# Patient Record
Sex: Female | Born: 1955 | Race: White | Hispanic: No | Smoking: Former smoker
Health system: Southern US, Community
[De-identification: ages and names within clinical notes are randomized; demographics above are authoritative.]

## PROBLEM LIST (undated history)

## (undated) DIAGNOSIS — C801 Malignant (primary) neoplasm, unspecified: Secondary | ICD-10-CM

## (undated) DIAGNOSIS — Z86010 Personal history of colonic polyps: Secondary | ICD-10-CM

## (undated) DIAGNOSIS — M87051 Idiopathic aseptic necrosis of right femur: Principal | ICD-10-CM

## (undated) DIAGNOSIS — F418 Other specified anxiety disorders: Secondary | ICD-10-CM

## (undated) DIAGNOSIS — C539 Malignant neoplasm of cervix uteri, unspecified: Secondary | ICD-10-CM

## (undated) DIAGNOSIS — F419 Anxiety disorder, unspecified: Secondary | ICD-10-CM

## (undated) DIAGNOSIS — M1990 Unspecified osteoarthritis, unspecified site: Secondary | ICD-10-CM

## (undated) DIAGNOSIS — E871 Hypo-osmolality and hyponatremia: Secondary | ICD-10-CM

## (undated) DIAGNOSIS — K219 Gastro-esophageal reflux disease without esophagitis: Secondary | ICD-10-CM

## (undated) DIAGNOSIS — E7800 Pure hypercholesterolemia, unspecified: Secondary | ICD-10-CM

## (undated) DIAGNOSIS — M87052 Idiopathic aseptic necrosis of left femur: Principal | ICD-10-CM

## (undated) DIAGNOSIS — N83209 Unspecified ovarian cyst, unspecified side: Secondary | ICD-10-CM

## (undated) DIAGNOSIS — I10 Essential (primary) hypertension: Secondary | ICD-10-CM

## (undated) DIAGNOSIS — F329 Major depressive disorder, single episode, unspecified: Secondary | ICD-10-CM

## (undated) DIAGNOSIS — E039 Hypothyroidism, unspecified: Secondary | ICD-10-CM

## (undated) DIAGNOSIS — F32A Depression, unspecified: Secondary | ICD-10-CM

## (undated) DIAGNOSIS — R21 Rash and other nonspecific skin eruption: Secondary | ICD-10-CM

## (undated) DIAGNOSIS — M8580 Other specified disorders of bone density and structure, unspecified site: Secondary | ICD-10-CM

## (undated) DIAGNOSIS — J189 Pneumonia, unspecified organism: Secondary | ICD-10-CM

## (undated) HISTORY — DX: Rash and other nonspecific skin eruption: R21

## (undated) HISTORY — DX: Idiopathic aseptic necrosis of left femur: M87.052

## (undated) HISTORY — DX: Pure hypercholesterolemia, unspecified: E78.00

## (undated) HISTORY — DX: Idiopathic aseptic necrosis of right femur: M87.051

## (undated) HISTORY — PX: JOINT REPLACEMENT: SHX530

## (undated) HISTORY — PX: CATARACT EXTRACTION: SUR2

## (undated) HISTORY — DX: Unspecified ovarian cyst, unspecified side: N83.209

## (undated) HISTORY — DX: Hypo-osmolality and hyponatremia: E87.1

## (undated) HISTORY — DX: Anxiety disorder, unspecified: F41.9

## (undated) HISTORY — DX: Gastro-esophageal reflux disease without esophagitis: K21.9

## (undated) HISTORY — DX: Malignant neoplasm of cervix uteri, unspecified: C53.9

## (undated) HISTORY — DX: Personal history of colonic polyps: Z86.010

## (undated) HISTORY — DX: Other specified disorders of bone density and structure, unspecified site: M85.80

## (undated) HISTORY — DX: Other specified anxiety disorders: F41.8

## (undated) HISTORY — DX: Essential (primary) hypertension: I10

## (undated) HISTORY — DX: Hypothyroidism, unspecified: E03.9

---

## 1980-10-23 DIAGNOSIS — C801 Malignant (primary) neoplasm, unspecified: Secondary | ICD-10-CM

## 1980-10-23 HISTORY — PX: ABDOMINAL HYSTERECTOMY: SHX81

## 1980-10-23 HISTORY — DX: Malignant (primary) neoplasm, unspecified: C80.1

## 1995-10-24 HISTORY — PX: OVARIAN CYST REMOVAL: SHX89

## 2003-10-24 LAB — HM DEXA SCAN

## 2007-02-27 ENCOUNTER — Inpatient Hospital Stay: Payer: Self-pay | Admitting: Internal Medicine

## 2007-02-27 ENCOUNTER — Other Ambulatory Visit: Payer: Self-pay

## 2007-03-05 ENCOUNTER — Ambulatory Visit: Payer: Self-pay | Admitting: Internal Medicine

## 2007-10-24 HISTORY — PX: COLONOSCOPY: SHX174

## 2007-10-25 ENCOUNTER — Ambulatory Visit: Payer: Self-pay | Admitting: Internal Medicine

## 2007-10-31 ENCOUNTER — Encounter: Admission: RE | Admit: 2007-10-31 | Discharge: 2007-10-31 | Payer: Self-pay | Admitting: Obstetrics and Gynecology

## 2007-11-07 ENCOUNTER — Encounter: Payer: Self-pay | Admitting: Internal Medicine

## 2007-11-07 ENCOUNTER — Ambulatory Visit: Payer: Self-pay | Admitting: Internal Medicine

## 2007-11-07 DIAGNOSIS — Z860100 Personal history of colon polyps, unspecified: Secondary | ICD-10-CM

## 2007-11-07 HISTORY — DX: Personal history of colonic polyps: Z86.010

## 2007-11-07 HISTORY — DX: Personal history of colon polyps, unspecified: Z86.0100

## 2007-11-07 LAB — HM COLONOSCOPY

## 2007-12-30 ENCOUNTER — Other Ambulatory Visit: Admission: RE | Admit: 2007-12-30 | Discharge: 2007-12-30 | Payer: Self-pay | Admitting: Obstetrics and Gynecology

## 2008-10-09 ENCOUNTER — Other Ambulatory Visit: Admission: RE | Admit: 2008-10-09 | Discharge: 2008-10-09 | Payer: Self-pay | Admitting: Obstetrics and Gynecology

## 2008-11-02 ENCOUNTER — Encounter: Admission: RE | Admit: 2008-11-02 | Discharge: 2008-11-02 | Payer: Self-pay | Admitting: Obstetrics and Gynecology

## 2009-10-26 LAB — HM PAP SMEAR

## 2009-11-03 ENCOUNTER — Encounter: Admission: RE | Admit: 2009-11-03 | Discharge: 2009-11-03 | Payer: Self-pay | Admitting: Obstetrics and Gynecology

## 2010-11-18 ENCOUNTER — Encounter
Admission: RE | Admit: 2010-11-18 | Discharge: 2010-11-18 | Payer: Self-pay | Source: Home / Self Care | Attending: Obstetrics and Gynecology | Admitting: Obstetrics and Gynecology

## 2011-08-11 ENCOUNTER — Encounter (HOSPITAL_COMMUNITY)
Admission: RE | Admit: 2011-08-11 | Discharge: 2011-08-11 | Disposition: A | Discharge status: Home / Self Care | Payer: PRIVATE HEALTH INSURANCE | Source: Ambulatory Visit | Attending: Orthopedic Surgery | Admitting: Orthopedic Surgery

## 2011-08-11 ENCOUNTER — Other Ambulatory Visit (HOSPITAL_COMMUNITY): Payer: Self-pay | Admitting: Orthopedic Surgery

## 2011-08-11 ENCOUNTER — Ambulatory Visit (HOSPITAL_COMMUNITY)
Admission: RE | Admit: 2011-08-11 | Discharge: 2011-08-11 | Disposition: A | Discharge status: Home / Self Care | Payer: PRIVATE HEALTH INSURANCE | Source: Ambulatory Visit | Attending: Orthopedic Surgery | Admitting: Orthopedic Surgery

## 2011-08-11 DIAGNOSIS — Z96649 Presence of unspecified artificial hip joint: Secondary | ICD-10-CM

## 2011-08-11 DIAGNOSIS — J45909 Unspecified asthma, uncomplicated: Secondary | ICD-10-CM | POA: Insufficient documentation

## 2011-08-11 LAB — COMPREHENSIVE METABOLIC PANEL
ALT: 15 U/L (ref 0–35)
CO2: 25 mEq/L (ref 19–32)
Creatinine, Ser: 0.65 mg/dL (ref 0.50–1.10)
GFR calc non Af Amer: >90 mL/min (ref >90)
Glucose, Bld: 91 mg/dL (ref 70–99)

## 2011-08-11 LAB — CBC
MCV: 83.2 fL (ref 78.0–100.0)
Platelets: 246 10*3/uL (ref 150–400)
WBC: 9.2 10*3/uL (ref 4.0–10.5)

## 2011-08-11 LAB — URINALYSIS, ROUTINE W REFLEX MICROSCOPIC
Glucose, UA: NEGATIVE mg/dL
Hgb urine dipstick: NEGATIVE
Ketones, ur: NEGATIVE mg/dL
Leukocytes, UA: NEGATIVE
Nitrite: NEGATIVE
Protein, ur: NEGATIVE mg/dL
Urobilinogen, UA: 0.2 mg/dL (ref 0.0–1.0)
pH: 6 (ref 5.0–8.0)

## 2011-08-11 LAB — SURGICAL PCR SCREEN
MRSA, PCR: NEGATIVE
Staphylococcus aureus: NEGATIVE

## 2011-08-11 LAB — TYPE AND SCREEN: ABO/RH(D): A POS

## 2011-09-01 ENCOUNTER — Encounter (HOSPITAL_COMMUNITY): Payer: Self-pay

## 2011-09-01 ENCOUNTER — Encounter (HOSPITAL_COMMUNITY)
Admission: RE | Admit: 2011-09-01 | Discharge: 2011-09-01 | Disposition: A | Discharge status: Home / Self Care | Payer: PRIVATE HEALTH INSURANCE | Source: Ambulatory Visit | Attending: Orthopedic Surgery | Admitting: Orthopedic Surgery

## 2011-09-01 ENCOUNTER — Encounter (HOSPITAL_COMMUNITY): Payer: Self-pay | Admitting: Pharmacy Technician

## 2011-09-01 HISTORY — DX: Pneumonia, unspecified organism: J18.9

## 2011-09-01 HISTORY — DX: Hypothyroidism, unspecified: E03.9

## 2011-09-01 HISTORY — DX: Malignant (primary) neoplasm, unspecified: C80.1

## 2011-09-01 HISTORY — DX: Essential (primary) hypertension: I10

## 2011-09-01 LAB — PROTIME-INR: Prothrombin Time: 12.7 seconds (ref 11.6–15.2)

## 2011-09-01 LAB — TYPE AND SCREEN

## 2011-09-01 LAB — CBC
HCT: 43.2 % (ref 36.0–46.0)
RBC: 5.26 MIL/uL — ABNORMAL HIGH (ref 3.87–5.11)
RDW: 13.9 % (ref 11.5–15.5)
WBC: 11.6 10*3/uL — ABNORMAL HIGH (ref 4.0–10.5)

## 2011-09-01 LAB — COMPREHENSIVE METABOLIC PANEL
AST: 12 U/L (ref 0–37)
Albumin: 3.9 g/dL (ref 3.5–5.2)
Alkaline Phosphatase: 59 U/L (ref 39–117)
BUN: 12 mg/dL (ref 6–23)
Chloride: 102 mEq/L (ref 96–112)
Potassium: 3.9 mEq/L (ref 3.5–5.1)
Total Bilirubin: 0.3 mg/dL (ref 0.3–1.2)

## 2011-09-01 NOTE — Pre-Procedure Instructions (Signed)
20 Dorothy [name]  09/01/2011   Your procedure is scheduled on:  09/06/11  Report to [facility] at 800 AM.  Call this number if you have problems the morning of surgery: [phone]   Remember:   Do not eat food:After Midnight.  Do not drink clear liquids: 4 Hours before arrival.  Take these medicines the morning of surgery with A SIP OF WATER: levothyroxine,venlataxine   Do not wear jewelry, make-up or nail polish.  Do not wear lotions, powders, or perfumes. You may wear deodorant.  Do not shave 48 hours prior to surgery.  Do not bring valuables to the hospital.  Contacts, dentures or bridgework may not be worn into surgery.  Leave suitcase in the car. After surgery it may be brought to your room.  For patients admitted to the hospital, checkout time is 11:00 AM the day of discharge.   Patients discharged the day of surgery will not be allowed to drive home.  Name and phone number of your driver: family  Special Instructions: CHG Shower Use Special Wash: 1/2 bottle night before surgery and 1/2 bottle morning of surgery.   Please read over the following fact sheets that you were given: MRSA Information and Surgical Site Infection Prevention

## 2011-09-05 MED ORDER — CEFAZOLIN SODIUM-DEXTROSE 2-3 GM-% IV SOLR
2.0000 g | INTRAVENOUS | Status: AC
  Administered 2011-09-06: 2 g via INTRAVENOUS
  Filled 2011-09-05: qty 50

## 2011-09-05 NOTE — H&P (Signed)
  [facility] [address]   [address] [place], [place] [address] [phone] A Division of [facility]  Loreta Ave, M.D.     Robert A. Thurston Hole, M.D.     Lunette Stands, M.D. Eulas Post, M.D.    Buford Dresser, M.D. Estell Harpin, M.D. Ralene Cork, D.O.          Genene Churn. Barry Dienes, PA-C            Kirstin A. Shepperson, PA-C [place], OPA-C   RE: Dorothy [name], Dorothy [name]   [id number]      DOB: July 05, 1956 PROGRESS NOTE: 09-01-11 Chief complaint: left hip pain. History of present illness: 55 year old white female with left hip AVN and pain returns. States pain is unchanged from previous visit and she wants to proceed with total hip replacement next week. She was scheduled for surgery a few weeks ago but was diagnosed with bronchitis and surgery was postponed. No current respiratory issues. Current medications: HCTZ, Levoxyl, Albuterol, Mobic, Estradiol, Effexor, Klonopin. Drug allergy to sulfa.  Past medical/surgical history: hypertension hyperlipidemia, hypothyroid allergic rhinitis tobacco use hysterectomy open laparotomy in 1995. Family history positive coronary artery disease ovarian cancer. 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 Smokes half pack per day. Occasional alcohol use. Review of systems: all other systems unremarkable.  EXAMINATION: Temp 98.4 pulse 75 respirations 12 blood pressure 145/89. Height 5'6" weight 190 pounds. She's alert and oriented x3 in no acute distress. Head normal cephalic atraumatic. Lungs clear bilaterally no wheezes rales or rhonchi. Heart regular rate and rhythm S1 S2 no murmurs. Abdomen round non-distended NBS x4. Left hip positive log roll negative straight leg raise. He's neurovascularly intact.  Skin warm and dry. No increase in respiratory effort.   IMPRESSION: Left hip AVN.  DISPOSITION: We'll proceed with left total hip replacement as  scheduled next week. All questions answered.  Loreta Ave, M.D.  Electronically verified by Loreta Ave, M.D. DFM(JMO):kh  Patient seen and examined. Proceed with left Total hip replacement. Risks benefits complications reviewed

## 2011-09-06 ENCOUNTER — Inpatient Hospital Stay (HOSPITAL_COMMUNITY): Payer: PRIVATE HEALTH INSURANCE

## 2011-09-06 ENCOUNTER — Inpatient Hospital Stay (HOSPITAL_COMMUNITY)
Admission: RE | Admit: 2011-09-06 | Discharge: 2011-09-08 | DRG: 470 | Disposition: A | Discharge status: Skilled Nursing Facility | Payer: PRIVATE HEALTH INSURANCE | Source: Ambulatory Visit | Attending: Orthopedic Surgery | Admitting: Orthopedic Surgery

## 2011-09-06 ENCOUNTER — Inpatient Hospital Stay (HOSPITAL_COMMUNITY): Payer: PRIVATE HEALTH INSURANCE | Admitting: Anesthesiology

## 2011-09-06 ENCOUNTER — Encounter (HOSPITAL_COMMUNITY): Payer: Self-pay | Admitting: Anesthesiology

## 2011-09-06 ENCOUNTER — Encounter (HOSPITAL_COMMUNITY)
Admission: RE | Disposition: A | Discharge status: Skilled Nursing Facility | Payer: Self-pay | Source: Ambulatory Visit | Attending: Orthopedic Surgery

## 2011-09-06 ENCOUNTER — Encounter (HOSPITAL_COMMUNITY): Payer: Self-pay | Admitting: *Deleted

## 2011-09-06 DIAGNOSIS — Z8041 Family history of malignant neoplasm of ovary: Secondary | ICD-10-CM

## 2011-09-06 DIAGNOSIS — M169 Osteoarthritis of hip, unspecified: Principal | ICD-10-CM | POA: Diagnosis present

## 2011-09-06 DIAGNOSIS — Z882 Allergy status to sulfonamides status: Secondary | ICD-10-CM

## 2011-09-06 DIAGNOSIS — E785 Hyperlipidemia, unspecified: Secondary | ICD-10-CM | POA: Diagnosis present

## 2011-09-06 DIAGNOSIS — Z01812 Encounter for preprocedural laboratory examination: Secondary | ICD-10-CM

## 2011-09-06 DIAGNOSIS — M1610 Unilateral primary osteoarthritis, unspecified hip: Principal | ICD-10-CM | POA: Diagnosis present

## 2011-09-06 DIAGNOSIS — Z8701 Personal history of pneumonia (recurrent): Secondary | ICD-10-CM

## 2011-09-06 DIAGNOSIS — F17200 Nicotine dependence, unspecified, uncomplicated: Secondary | ICD-10-CM | POA: Diagnosis present

## 2011-09-06 DIAGNOSIS — E039 Hypothyroidism, unspecified: Secondary | ICD-10-CM | POA: Diagnosis present

## 2011-09-06 DIAGNOSIS — Z8249 Family history of ischemic heart disease and other diseases of the circulatory system: Secondary | ICD-10-CM

## 2011-09-06 DIAGNOSIS — I10 Essential (primary) hypertension: Secondary | ICD-10-CM | POA: Diagnosis present

## 2011-09-06 DIAGNOSIS — M87059 Idiopathic aseptic necrosis of unspecified femur: Secondary | ICD-10-CM | POA: Diagnosis present

## 2011-09-06 HISTORY — PX: TOTAL HIP ARTHROPLASTY: SHX124

## 2011-09-06 LAB — POCT I-STAT 4, (NA,K, GLUC, HGB,HCT)
Glucose, Bld: 87 mg/dL (ref 70–99)
HCT: 42 % (ref 36.0–46.0)
Sodium: 142 mEq/L (ref 135–145)

## 2011-09-06 SURGERY — ARTHROPLASTY, HIP, TOTAL,POSTERIOR APPROACH
Anesthesia: General | Site: Hip | Laterality: Left | Wound class: Clean

## 2011-09-06 MED ORDER — PHENOL 1.4 % MT LIQD
1.0000 | OROMUCOSAL | Status: DC | PRN
  Filled 2011-09-06: qty 177

## 2011-09-06 MED ORDER — BUPIVACAINE HCL (PF) 0.25 % IJ SOLN
INTRAMUSCULAR | Status: DC | PRN
  Administered 2011-09-06: 20 mL

## 2011-09-06 MED ORDER — CEFAZOLIN SODIUM 1-5 GM-% IV SOLN
1.0000 g | Freq: Four times a day (QID) | INTRAVENOUS | Status: AC
  Administered 2011-09-06 – 2011-09-07 (×3): 1 g via INTRAVENOUS
  Filled 2011-09-06 (×4): qty 50

## 2011-09-06 MED ORDER — ALBUTEROL SULFATE HFA 108 (90 BASE) MCG/ACT IN AERS
INHALATION_SPRAY | RESPIRATORY_TRACT | Status: DC | PRN
  Administered 2011-09-06 (×2): 5 via RESPIRATORY_TRACT

## 2011-09-06 MED ORDER — ROCURONIUM BROMIDE 100 MG/10ML IV SOLN
INTRAVENOUS | Status: DC | PRN
  Administered 2011-09-06: 50 mg via INTRAVENOUS

## 2011-09-06 MED ORDER — CLONAZEPAM 0.5 MG PO TABS
0.7500 mg | ORAL_TABLET | Freq: Every day | ORAL | Status: DC
  Administered 2011-09-06 – 2011-09-07 (×2): 0.75 mg via ORAL
  Filled 2011-09-06 (×2): qty 1
  Filled 2011-09-06: qty 2

## 2011-09-06 MED ORDER — MIDAZOLAM HCL 5 MG/5ML IJ SOLN
INTRAMUSCULAR | Status: DC | PRN
  Administered 2011-09-06: 2 mg via INTRAVENOUS

## 2011-09-06 MED ORDER — SODIUM CHLORIDE 0.9 % IR SOLN
Status: DC | PRN
  Administered 2011-09-06: 1000 mL

## 2011-09-06 MED ORDER — PATIENT'S GUIDE TO USING COUMADIN BOOK
Freq: Once | Status: AC
  Administered 2011-09-06: 18:00:00
  Filled 2011-09-06: qty 1

## 2011-09-06 MED ORDER — ONDANSETRON HCL 4 MG PO TABS: 4.0000 mg | ORAL_TABLET | Freq: Four times a day (QID) | ORAL | Status: DC | PRN

## 2011-09-06 MED ORDER — LEVOTHYROXINE SODIUM 125 MCG PO TABS
125.0000 ug | ORAL_TABLET | Freq: Every day | ORAL | Status: DC
  Administered 2011-09-06 – 2011-09-08 (×3): 125 ug via ORAL
  Filled 2011-09-06 (×4): qty 1

## 2011-09-06 MED ORDER — WARFARIN VIDEO
Freq: Once | Status: DC
  Filled 2011-09-06: qty 1

## 2011-09-06 MED ORDER — HYDROCHLOROTHIAZIDE 25 MG PO TABS
25.0000 mg | ORAL_TABLET | Freq: Every day | ORAL | Status: DC
  Administered 2011-09-06 – 2011-09-08 (×3): 25 mg via ORAL
  Filled 2011-09-06 (×3): qty 1

## 2011-09-06 MED ORDER — WARFARIN VIDEO
Freq: Once | Status: AC
  Administered 2011-09-06: 18:00:00
  Filled 2011-09-06: qty 1

## 2011-09-06 MED ORDER — ACETAMINOPHEN 325 MG PO TABS: 650.0000 mg | ORAL_TABLET | Freq: Four times a day (QID) | ORAL | Status: DC | PRN

## 2011-09-06 MED ORDER — ACETAMINOPHEN 10 MG/ML IV SOLN
1000.0000 mg | INTRAVENOUS | Status: AC
  Administered 2011-09-06: 1000 mg via INTRAVENOUS
  Filled 2011-09-06: qty 100

## 2011-09-06 MED ORDER — NEOSTIGMINE METHYLSULFATE 1 MG/ML IJ SOLN
INTRAMUSCULAR | Status: DC | PRN
  Administered 2011-09-06: 3 mg via INTRAVENOUS

## 2011-09-06 MED ORDER — GLYCOPYRROLATE 0.2 MG/ML IJ SOLN
INTRAMUSCULAR | Status: DC | PRN
  Administered 2011-09-06: .4 mg via INTRAVENOUS

## 2011-09-06 MED ORDER — VENLAFAXINE HCL ER 150 MG PO CP24
150.0000 mg | ORAL_CAPSULE | Freq: Every day | ORAL | Status: DC
  Administered 2011-09-06 – 2011-09-08 (×3): 150 mg via ORAL
  Filled 2011-09-06 (×3): qty 1

## 2011-09-06 MED ORDER — DOCUSATE SODIUM 100 MG PO CAPS
100.0000 mg | ORAL_CAPSULE | Freq: Two times a day (BID) | ORAL | Status: DC
  Administered 2011-09-06 – 2011-09-08 (×5): 100 mg via ORAL
  Filled 2011-09-06 (×6): qty 1

## 2011-09-06 MED ORDER — ENOXAPARIN SODIUM 40 MG/0.4ML ~~LOC~~ SOLN
40.0000 mg | SUBCUTANEOUS | Status: DC
  Administered 2011-09-06 – 2011-09-07 (×2): 40 mg via SUBCUTANEOUS
  Filled 2011-09-06 (×3): qty 0.4

## 2011-09-06 MED ORDER — PROPOFOL 10 MG/ML IV EMUL
INTRAVENOUS | Status: DC | PRN
  Administered 2011-09-06: 200 mg via INTRAVENOUS

## 2011-09-06 MED ORDER — POTASSIUM CHLORIDE IN NACL 20-0.45 MEQ/L-% IV SOLN
INTRAVENOUS | Status: DC
  Administered 2011-09-07 – 2011-09-08 (×2): via INTRAVENOUS
  Filled 2011-09-06 (×10): qty 1000

## 2011-09-06 MED ORDER — PROMETHAZINE HCL 25 MG/ML IJ SOLN: 6.2500 mg | INTRAMUSCULAR | Status: DC | PRN

## 2011-09-06 MED ORDER — HYDROMORPHONE HCL PF 1 MG/ML IJ SOLN
0.2500 mg | INTRAMUSCULAR | Status: DC | PRN
  Administered 2011-09-06 (×2): 0.5 mg via INTRAVENOUS

## 2011-09-06 MED ORDER — WARFARIN SODIUM 5 MG PO TABS
5.0000 mg | ORAL_TABLET | Freq: Once | ORAL | Status: AC
  Administered 2011-09-06: 5 mg via ORAL
  Filled 2011-09-06: qty 1

## 2011-09-06 MED ORDER — FENTANYL CITRATE 0.05 MG/ML IJ SOLN
INTRAMUSCULAR | Status: DC | PRN
  Administered 2011-09-06: 50 ug via INTRAVENOUS
  Administered 2011-09-06: 100 ug via INTRAVENOUS
  Administered 2011-09-06 (×2): 50 ug via INTRAVENOUS
  Administered 2011-09-06: 100 ug via INTRAVENOUS
  Administered 2011-09-06: 150 ug via INTRAVENOUS

## 2011-09-06 MED ORDER — OXYCODONE-ACETAMINOPHEN 5-325 MG PO TABS
1.0000 | ORAL_TABLET | ORAL | Status: DC | PRN
  Administered 2011-09-06 – 2011-09-08 (×8): 2 via ORAL
  Filled 2011-09-06 (×8): qty 2

## 2011-09-06 MED ORDER — HETASTARCH-ELECTROLYTES 6 % IV SOLN
INTRAVENOUS | Status: DC | PRN
  Administered 2011-09-06: 10:00:00 via INTRAVENOUS

## 2011-09-06 MED ORDER — ONDANSETRON HCL 4 MG/2ML IJ SOLN: 4.0000 mg | Freq: Four times a day (QID) | INTRAMUSCULAR | Status: DC | PRN

## 2011-09-06 MED ORDER — ONDANSETRON HCL 4 MG/2ML IJ SOLN
INTRAMUSCULAR | Status: DC | PRN
  Administered 2011-09-06: 4 mg via INTRAVENOUS

## 2011-09-06 MED ORDER — EPHEDRINE SULFATE 50 MG/ML IJ SOLN
INTRAMUSCULAR | Status: DC | PRN
  Administered 2011-09-06 (×2): 10 mg via INTRAVENOUS
  Administered 2011-09-06: 5 mg via INTRAVENOUS

## 2011-09-06 MED ORDER — LACTATED RINGERS IV SOLN
INTRAVENOUS | Status: DC | PRN
  Administered 2011-09-06 (×2): via INTRAVENOUS

## 2011-09-06 MED ORDER — MENTHOL 3 MG MT LOZG: 1.0000 | LOZENGE | OROMUCOSAL | Status: DC | PRN

## 2011-09-06 MED ORDER — ACETAMINOPHEN 650 MG RE SUPP: 650.0000 mg | Freq: Four times a day (QID) | RECTAL | Status: DC | PRN

## 2011-09-06 SURGICAL SUPPLY — 62 items
BLADE SAW SAG 73X25 THK (BLADE) ×1
BLADE SAW SGTL 73X25 THK (BLADE) ×1 IMPLANT
BOOTCOVER CLEANROOM LRG (PROTECTIVE WEAR) ×4 IMPLANT
BRUSH FEMORAL CANAL (MISCELLANEOUS) IMPLANT
CLOTH BEACON ORANGE TIMEOUT ST (SAFETY) ×2 IMPLANT
COVER BACK TABLE 24X17X13 BIG (DRAPES) IMPLANT
COVER SURGICAL LIGHT HANDLE (MISCELLANEOUS) ×2 IMPLANT
DRAPE INCISE IOBAN 66X45 STRL (DRAPES) IMPLANT
DRAPE ORTHO SPLIT 77X108 STRL (DRAPES) ×2
DRAPE SURG ORHT 6 SPLT 77X108 (DRAPES) ×2 IMPLANT
DRAPE U-SHAPE 47X51 STRL (DRAPES) ×2 IMPLANT
DRSG PAD ABDOMINAL 8X10 ST (GAUZE/BANDAGES/DRESSINGS) ×4 IMPLANT
DURAPREP 26ML APPLICATOR (WOUND CARE) ×2 IMPLANT
ELECT BLADE 6.5 EXT (BLADE) ×2 IMPLANT
ELECT CAUTERY BLADE 6.4 (BLADE) ×2 IMPLANT
ELECT REM PT RETURN 9FT ADLT (ELECTROSURGICAL) ×2
ELECTRODE REM PT RTRN 9FT ADLT (ELECTROSURGICAL) ×1 IMPLANT
EVACUATOR 1/8 PVC DRAIN (DRAIN) IMPLANT
FACESHIELD LNG OPTICON STERILE (SAFETY) ×4 IMPLANT
GAUZE XEROFORM 5X9 LF (GAUZE/BANDAGES/DRESSINGS) ×2 IMPLANT
GLOVE BIOGEL PI IND STRL 8 (GLOVE) ×1 IMPLANT
GLOVE BIOGEL PI IND STRL 8.5 (GLOVE) ×1 IMPLANT
GLOVE BIOGEL PI INDICATOR 8 (GLOVE) ×1
GLOVE BIOGEL PI INDICATOR 8.5 (GLOVE) ×1
GLOVE ECLIPSE 8.5 STRL (GLOVE) ×2 IMPLANT
GLOVE ORTHO TXT STRL SZ7.5 (GLOVE) ×4 IMPLANT
GLOVE SURG SS PI 7.5 STRL IVOR (GLOVE) ×2 IMPLANT
GOWN PREVENTION PLUS XLARGE (GOWN DISPOSABLE) ×2 IMPLANT
GOWN STRL NON-REIN LRG LVL3 (GOWN DISPOSABLE) ×2 IMPLANT
GOWN STRL REIN 2XL LVL4 (GOWN DISPOSABLE) ×4 IMPLANT
HANDPIECE INTERPULSE COAX TIP (DISPOSABLE)
KIT BASIN OR (CUSTOM PROCEDURE TRAY) ×2 IMPLANT
KIT ROOM TURNOVER OR (KITS) ×2 IMPLANT
MANIFOLD NEPTUNE II (INSTRUMENTS) ×2 IMPLANT
MARKER SKIN DUAL TIP RULER LAB (MISCELLANEOUS) ×2 IMPLANT
NEEDLE HYPO 25GX1X1/2 BEV (NEEDLE) ×2 IMPLANT
NS IRRIG 1000ML POUR BTL (IV SOLUTION) ×2 IMPLANT
PACK TOTAL JOINT (CUSTOM PROCEDURE TRAY) ×2 IMPLANT
PAD ARMBOARD 7.5X6 YLW CONV (MISCELLANEOUS) ×2 IMPLANT
PASSER SUT SWANSON 36MM LOOP (INSTRUMENTS) ×2 IMPLANT
PILLOW ABDUCTION HIP (SOFTGOODS) ×2 IMPLANT
PRESSURIZER FEMORAL UNIV (MISCELLANEOUS) IMPLANT
SET HNDPC FAN SPRY TIP SCT (DISPOSABLE) IMPLANT
SPONGE GAUZE 4X4 12PLY (GAUZE/BANDAGES/DRESSINGS) ×2 IMPLANT
SPONGE LAP 4X18 X RAY DECT (DISPOSABLE) IMPLANT
STAPLER VISISTAT 35W (STAPLE) ×2 IMPLANT
SUCTION FRAZIER TIP 10 FR DISP (SUCTIONS) ×2 IMPLANT
SUT FIBERWIRE #2 38 REV NDL BL (SUTURE) ×6
SUT VIC AB 1 CTX 36 (SUTURE) ×3
SUT VIC AB 1 CTX36XBRD ANBCTR (SUTURE) ×3 IMPLANT
SUT VIC AB 2-0 SH 27 (SUTURE) ×3
SUT VIC AB 2-0 SH 27XBRD (SUTURE) ×3 IMPLANT
SUT VIC AB 3-0 SH 27 (SUTURE)
SUT VIC AB 3-0 SH 27X BRD (SUTURE) IMPLANT
SUTURE FIBERWR#2 38 REV NDL BL (SUTURE) ×3 IMPLANT
SYR 30ML SLIP (SYRINGE) ×2 IMPLANT
SYR CONTROL 10ML LL (SYRINGE) ×2 IMPLANT
TOWEL OR 17X24 6PK STRL BLUE (TOWEL DISPOSABLE) ×2 IMPLANT
TOWEL OR 17X26 10 PK STRL BLUE (TOWEL DISPOSABLE) ×2 IMPLANT
TOWER CARTRIDGE SMART MIX (DISPOSABLE) IMPLANT
TRAY FOLEY CATH 14FR (SET/KITS/TRAYS/PACK) ×2 IMPLANT
WATER STERILE IRR 1000ML POUR (IV SOLUTION) ×4 IMPLANT

## 2011-09-06 NOTE — Anesthesia Procedure Notes (Signed)
Procedure Name: Intubation Date/Time: 09/06/2011 8:42 AM Performed by: Leona Singleton A. Oxygen Delivery Method: Circle System Utilized Preoxygenation: Pre-oxygenation with 100% oxygen Intubation Type: IV induction Ventilation: Mask ventilation without difficulty Tube type: Oral Tube size: 7.0 mm Number of attempts: 1 Placement Confirmation: ETT inserted through vocal cords under direct vision,  positive ETCO2 and breath sounds checked- equal and bilateral Secured at: 21 cm Tube secured with: Tape Dental Injury: Teeth and Oropharynx as per pre-operative assessment

## 2011-09-06 NOTE — Op Note (Signed)
NAME:  Dorothy [name], Dorothy [name] NO.:  [id number]  MEDICAL RECORD NO.:  [id number]  LOCATION:  5005                         FACILITY:  [facility]  PHYSICIAN:  Loreta Ave, M.D. 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 Moderate degenerative change in the joint.  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 Moderate degenerative change in the joint.  PROCEDURE:  Left total hip replacement utilizing a Stryker Secur-Fit prosthesis.  A press-fit 54 mm acetabular component screw fixation x2. A 40 mm internal diameter polyethylene liner.  A press-fit #9 Secur-Fit femoral stem.  127-degree neck angle.  A 40 mm -2.5 Biolox head.  SURGEON:  Loreta Ave, M.D.  ASSISTANT:  Genene Churn. Barry Dienes, [place], present throughout the entire case and necessary for timely completion of procedure.  ANESTHESIA:  General.  BLOOD LOSS:  250 mL.  BLOOD GIVEN:  None.  SPECIMENS:  None.  CULTURES:  None.  COMPLICATION:  None.  DRESSINGS:  Soft, compressive abduction pillow.  DESCRIPTION OF PROCEDURE:  The patient was brought to operating room, placed on the operating table in supine position.  After adequate anesthesia had been obtained, turned to a lateral position, left side up.  Appropriate padding and support.  Prepped and draped in usual sterile fashion.  Incision along the femur extending posterosuperior. Skin and subcu tissue divided.  Iliotibial band exposed and incised. Charnley retractor put in place.  Neurovascular structures identified and protected.  External rotator and capsule taken down off the back of the intertrochanteric groove of the femur.  Hip was dislocated posteriorly.  Femoral neck cut 1 fingerbreadth above the lesser trochanter.  The head was removed.  All of those significant  changes within the head on the MRI and overlying cartilage was thinned, but still intact.  Mild-to-moderate change in the acetabulum.  The acetabulum was brought up to good bleeding bone, fitted with a 54 mm cup at 45 degrees of abduction, 20 degrees of anteversion.  Nice and secure fitting in place.  Augmented with 2 screws, placed through the cup.  A 40 mm internal diameter polyethylene liner was inserted.  Attention was turned to the femur.  Distal and proximal broaches and reamers were used brought this femur up to appropriate sizing and fitting for #9 component.  After appropriate trials, the #9 component seated.  A 40 mm Biolox head -2.5 attached.  Hip reduced.  This gave me equal leg lengths.  Nice stability in flexion and extension.  Wound irrigated. External rotator and capsule repaired to the back of the intertrochanteric groove through drill holes, tied over bony bridge. Wound irrigated.  Iliotibial band closed with 1 Vicryl.  Skin and subcutaneous tissue with Vicryl and staples.  Margins were injected with Marcaine.  Sterile compressive dressing applied.  Anesthesia reversed. Brought to the recovery room.  Tolerated surgery well.  No complications.     Loreta Ave, M.D.     DFM/MEDQ  D:  09/06/2011  T:  09/06/2011  Job:  [id number]

## 2011-09-06 NOTE — Preoperative (Signed)
Beta Blockers   Reason not to administer Beta Blockers:Not Applicable 

## 2011-09-06 NOTE — Anesthesia Postprocedure Evaluation (Signed)
  Anesthesia Post-op Note  Patient: Dorothy [name]  Procedure(s) Performed:  TOTAL HIP ARTHROPLASTY - TOTAL HIP ARTHROPLASTY LEFT SIDE  Patient Location: PACU  Anesthesia Type: General  Level of Consciousness: awake, alert  and oriented  Airway and Oxygen Therapy: Patient Spontanous Breathing  Post-op Pain: mild  Post-op Assessment: Post-op Vital signs reviewed, Patient's Cardiovascular Status Stable, Respiratory Function Stable, Patent Airway, No signs of Nausea or vomiting and Pain level controlled  Post-op Vital Signs: stable  Complications: No apparent anesthesia complications

## 2011-09-06 NOTE — Progress Notes (Signed)
ANTICOAGULATION CONSULT NOTE - Initial Consult  Pharmacy Consult for coumadin and enoxaparin Indication: VTE prophylaxis  Allergies  Allergen Reactions  . Sulfa Antibiotics Rash    Patient Measurements: Weight: 189 lb (85.73 kg) Ideal body weight is 59 kg.  Vital Signs: Temp: 97.6 F (36.4 C) (11/14 1400) Temp src: Oral (11/14 1400) BP: 104/65 mmHg (11/14 1400) Pulse Rate: 80  (11/14 1400)  Labs:  Basename 09/06/11 0814  HGB 14.3  HCT 42.0  PLT --  APTT --  LABPROT --  INR --  HEPARINUNFRC --  CREATININE --  CKTOTAL --  CKMB --  TROPONINI --   CrCl is unknown because there is no height on file for the current visit.  Medical History: Past Medical History  Diagnosis Date  . Pneumonia   . Hypertension   . Constipation   . Hypothyroidism   . Cancer     ovarian    Medications:  Scheduled:    . acetaminophen  1,000 mg Intravenous To OR  . ceFAZolin (ANCEF) IV  1 g Intravenous Q6H  . ceFAZolin (ANCEF) IV  2 g Intravenous 60 min Pre-Op  . clonazePAM  0.75 mg Oral QHS  . docusate sodium  100 mg Oral BID  . enoxaparin  40 mg Subcutaneous Q24H  . hydrochlorothiazide  25 mg Oral Daily  . levothyroxine  125 mcg Oral Daily  . venlafaxine  150 mg Oral Daily    Assessment: 55 yo female for left hip replacement.  INR currently 0.93 and creatinine is 0.66.  Warfarin score is 6.  Patient is on enoxaparin 40mg  daily and will start coumadin today.   Will also begin education.  Goal of Therapy:  INR 2-3   Plan:  Continue enoxaparin 40 daily. Warfarin 5mg  at 1800 today. INRs daily. Coumadin teaching book and video. Check patient in am.  Samella Parr 09/06/2011,2:33 PM

## 2011-09-06 NOTE — Anesthesia Preprocedure Evaluation (Addendum)
Anesthesia Evaluation  Patient identified by MRN, date of birth, ID band Patient awake    Reviewed: Allergy & Precautions, H&P , NPO status , Patient's Chart, lab work & pertinent test results  Airway Mallampati: II TM Distance: >3 FB Neck ROM: Full    Dental  (+) Teeth Intact and Dental Advisory Given   Pulmonary pneumonia  (remote history of), Current Smoker (1ppd),   Hoarse sounding-pt reports sinus issues. Decreased breath sounds, bilateral bases. Clear throughout.  + decreased breath sounds      Cardiovascular Exercise Tolerance: Good Pt. on medications Regular Normal    Neuro/Psych Negative Neurological ROS  Negative Psych ROS   GI/Hepatic negative GI ROS, Neg liver ROS,   Endo/Other  Hypothyroidism   Renal/GU negative Renal ROS  Genitourinary negative   Musculoskeletal  (+) Arthritis -, Osteoarthritis,    Abdominal   Peds negative pediatric ROS (+)  Hematology   Anesthesia Other Findings   Reproductive/Obstetrics negative OB ROS                          Anesthesia Physical Anesthesia Plan  ASA: II  Anesthesia Plan: General   Post-op Pain Management:    Induction: Intravenous  Airway Management Planned: Oral ETT  Additional Equipment:   Intra-op Plan:   Post-operative Plan: Extubation in OR  Informed Consent: I have reviewed the patients History and Physical, chart, labs and discussed the procedure including the risks, benefits and alternatives for the proposed anesthesia with the patient or authorized representative who has indicated his/her understanding and acceptance.     Plan Discussed with: CRNA and Surgeon  Anesthesia Plan Comments:         Anesthesia Quick Evaluation

## 2011-09-06 NOTE — Transfer of Care (Signed)
Immediate Anesthesia Transfer of Care Note  Patient: Dorothy [name]  Procedure(s) Performed:  TOTAL HIP ARTHROPLASTY - TOTAL HIP ARTHROPLASTY LEFT SIDE  Patient Location: PACU  Anesthesia Type: General  Level of Consciousness: sedated and patient cooperative  Airway & Oxygen Therapy: Patient Spontanous Breathing and Patient connected to nasal cannula oxygen  Post-op Assessment: Report given to PACU RN and Post -op Vital signs reviewed and stable  Post vital signs: Reviewed and stable  Complications: No apparent anesthesia complications

## 2011-09-06 NOTE — Brief Op Note (Signed)
09/06/2011  10:28 AM  PATIENT:  Dorothy [name]  55 y.o. female  PRE-OPERATIVE DIAGNOSIS:  Avascular necrosis, left hip  POST-OPERATIVE DIAGNOSIS:  Avascular necrosis, left hip  PROCEDURE:  Procedure(s): TOTAL HIP ARTHROPLASTY  SURGEON:  Surgeon(s): Loreta Ave, MD  PHYSICIAN ASSISTANT:   ASSISTANTS: Rainier Feuerborn M   ANESTHESIA:   general  EBL:  Total I/O In: 2000 [I.V.:2000] Out: 900 [Urine:650; Blood:250]     SPECIMEN:  No Specimen  DISPOSITION OF SPECIMEN:  N/A  COUNTS:  yes  TOURNIQUET:  * No tourniquets in log *  DICTATION: .Other Dictation: Dictation Number   PLAN OF CARE: Admit to inpatient   PATIENT DISPOSITION:  PACU - hemodynamically stable.   Delay start of Pharmacological VTE agent (>24hrs) due to surgical blood loss or risk of bleeding:  {YES/NO/NOT APPLICABLE:20182

## 2011-09-07 ENCOUNTER — Encounter (HOSPITAL_COMMUNITY): Payer: Self-pay

## 2011-09-07 LAB — BASIC METABOLIC PANEL
BUN: 7 mg/dL (ref 6–23)
Chloride: 105 mEq/L (ref 96–112)
GFR calc Af Amer: >90 mL/min (ref >90)
Potassium: 3.8 mEq/L (ref 3.5–5.1)
Sodium: 141 mEq/L (ref 135–145)

## 2011-09-07 LAB — CBC
HCT: 34.6 % — ABNORMAL LOW (ref 36.0–46.0)
Hemoglobin: 12.1 g/dL (ref 12.0–15.0)
RBC: 4.11 MIL/uL (ref 3.87–5.11)
RDW: 14.4 % (ref 11.5–15.5)

## 2011-09-07 MED ORDER — WARFARIN SODIUM 6 MG PO TABS
6.0000 mg | ORAL_TABLET | Freq: Once | ORAL | Status: AC
  Administered 2011-09-07: 6 mg via ORAL
  Filled 2011-09-07: qty 1

## 2011-09-07 MED ORDER — OXYCODONE HCL 5 MG PO TABS: 5.0000 mg | ORAL_TABLET | ORAL | Status: DC | PRN

## 2011-09-07 NOTE — Progress Notes (Signed)
Occupational Therapy Evaluation Patient Details Name: Dorothy [name] MRN: [id number] DOB: 1956/02/26 Today's Date: 09/07/2011  Problem List: There is no problem list on file for this patient.   Past Medical History:  Past Medical History  Diagnosis Date  . Pneumonia   . Hypertension   . Constipation   . Hypothyroidism   . Cancer     ovarian   Past Surgical History:  Past Surgical History  Procedure Date  . Abdominal hysterectomy   . Total hip arthroplasty 09/06/2011    Procedure: TOTAL HIP ARTHROPLASTY;  Surgeon: Loreta Ave, MD;  Location: [facility] OR;  Service: Orthopedics;  Laterality: Left;  TOTAL HIP ARTHROPLASTY LEFT SIDE    OT Assessment/Plan/Recommendation OT Assessment Clinical Impression Statement: Pt will benefit from OT services in the acute care setting to increase independence with ADLs and to increase independence with functional transfers while maintaining posterior hip precautions in order to maximize benefits of rehab in prep for eventual d/c home. OT Recommendation/Assessment: Patient will need skilled OT in the acute care venue OT Problem List: Decreased activity tolerance;Decreased knowledge of use of DME or AE;Decreased knowledge of precautions;Pain OT Therapy Diagnosis : Acute pain OT Plan OT Frequency: Min 1X/week OT Treatment/Interventions: Self-care/ADL training;DME and/or AE instruction;Therapeutic activities;Patient/family education OT Recommendation Follow Up Recommendations: Skilled nursing facility Equipment Recommended: None recommended by PT Individuals Consulted Consulted and Agree with Results and Recommendations: Patient OT Goals Acute Rehab OT Goals OT Goal Formulation: With patient Time For Goal Achievement: 7 days ADL Goals Pt Will Perform Grooming: with modified independence;Standing at sink;Other (comment) (while maintaining posterior hip precautions) ADL Goal: Grooming - Progress: Other (comment) Pt Will Perform Lower Body  Bathing: with modified independence;Sitting, edge of bed;with adaptive equipment;Other (comment) (while maintaining posterior hip precautions) ADL Goal: Lower Body Bathing - Progress: Other (comment) Pt Will Perform Lower Body Dressing: with modified independence;Sit to stand from bed;with adaptive equipment;Other (comment) (while maintaining posterior hip precautions) ADL Goal: Lower Body Dressing - Progress: Other (comment) Pt Will Transfer to Toilet: with modified independence;Stand pivot transfer;with DME;3-in-1 ADL Goal: Toilet Transfer - Progress: Other (comment) Pt Will Perform Toileting - Clothing Manipulation: with modified independence;Standing;Other (comment) (while maintaining posterior hip precautions) ADL Goal: Toileting - Clothing Manipulation - Progress: Other (comment) Pt Will Perform Toileting - Hygiene: with modified independence;Sit to stand from 3-in-1/toilet;Other (comment) (while maintaining posterior hip precautions) ADL Goal: Toileting - Hygiene - Progress: Other (comment)  OT Evaluation Precautions/Restrictions  Precautions Precautions: Posterior Hip Precaution Booklet Issued: Yes (comment) Precaution Comments: posterior total hip handout provided and pt. educated Restrictions Weight Bearing Restrictions: Yes LLE Weight Bearing: Touchdown weight bearing Other Position/Activity Restrictions: posterior total hip precautions Prior Functioning Home Living Lives With: Alone Additional Comments: all home living infor obtained during PT/OT co-eval Prior Function Level of Independence:  (as per OT entry during PT/OT co-eval) Able to Take Stairs?: Yes Driving: Yes ADL ADL Eating/Feeding: Simulated;Independent Where Assessed - Eating/Feeding: Chair Grooming: Simulated;Independent Where Assessed - Grooming: Sitting, chair Upper Body Bathing: Simulated;Set up Upper Body Bathing Details (indicate cue type and reason): Setup assist to gather bathing materials. Where  Assessed - Upper Body Bathing: Sitting, bed Lower Body Bathing: Simulated;Moderate assistance Lower Body Bathing Details (indicate cue type and reason): Mod assist due to posterior hip precautions. Where Assessed - Lower Body Bathing: Sit to stand from bed Upper Body Dressing: Simulated;Independent Where Assessed - Upper Body Dressing: Sitting, bed Lower Body Dressing: Simulated;Moderate assistance Lower Body Dressing Details (indicate cue type and reason): Mod assist due  to posterior hip precautions. Where Assessed - Lower Body Dressing: Sit to stand from bed Toilet Transfer: Performed;Minimal assistance Toilet Transfer Details (indicate cue type and reason): VC for sequencing and for safe technique while maintaining posterior hip precautions. Toilet Transfer Method: Surveyor, minerals: Set designer - Clothing Manipulation: Performed;Supervision/safety Toileting - Clothing Manipulation Details (indicate cue type and reason): Pt manipulated hospital gown with supervision while standing in prep for toileting on bedside commode. Where Assessed - Toileting Clothing Manipulation: Standing Toileting - Hygiene: Performed;Supervision/safety Toileting - Hygiene Details (indicate cue type and reason): Supervision for safety and maintaining posterior hip precautions. Where Assessed - Toileting Hygiene: Standing Tub/Shower Transfer: Not assessed Equipment Used: Rolling walker ADL Comments: Pt demonstrates decreased independence with ADLs and functional transfers due to pain in L hip and posterior hip precuations. May benefit from AE education for LB dressing/bathing. Vision/Perception    Cognition Cognition Arousal/Alertness: Awake/alert Overall Cognitive Status: Appears within functional limits for tasks assessed Orientation Level: Oriented X4 Sensation/Coordination Sensation Light Touch: Appears Intact (lower extremities) Extremity Assessment RUE  Assessment RUE Assessment: Within Functional Limits LUE Assessment LUE Assessment: Within Functional Limits Mobility  Bed Mobility Bed Mobility: Yes Supine to Sit: 1: +2 Total assist;Patient percentage (comment) (pt. 50%) Supine to Sit Details (indicate cue type and reason): multimodal cues for safe technique and posterior THR precautions Sit to Supine - Right: Not Tested (comment) Transfers Transfers: Yes Sit to Stand: 1: +2 Total assist;Patient percentage (comment) (pt. 70%) Sit to Stand Details (indicate cue type and reason): cues for hand placement, safe technique and TDWB Stand to Sit: 1: +2 Total assist;Patient percentage (comment) (pt. 70%) Stand to Sit Details: cues for safe technique and hip precautions Exercises Total Joint Exercises Ankle Circles/Pumps: AROM;Both;10 reps Quad Sets: AROM;Left;10 reps Heel Slides: AAROM;Left;5 reps End of Session OT - End of Session Equipment Utilized During Treatment: Gait belt Activity Tolerance: Patient tolerated treatment well Patient left: in chair;with call bell in reach General Behavior During Session: [facility] for tasks performed Cognition: [facility] for tasks performed   Cipriano Mile 09/07/2011, 1:46 PM  09/07/2011 Cipriano Mile OTR/L Pager [phone] Office [phone]

## 2011-09-07 NOTE — Progress Notes (Signed)
ANTICOAGULATION CONSULT NOTE - Follow Up Consult  Pharmacy Consult for Coumadin Indication: VTE prophylaxis s/p left THA  Allergies  Allergen Reactions  . Sulfa Antibiotics Rash    Patient Measurements: Height: 5\' 6"  (167.6 cm) Weight: 192 lb 0.3 oz (87.1 kg) IBW/kg (Calculated) : 59.3    Vital Signs: Temp: 98.5 F (36.9 C) (11/15 0532) BP: 132/70 mmHg (11/15 0532) Pulse Rate: 83  (11/15 0532)  Labs:  Basename 09/07/11 0640 09/06/11 0814  HGB 12.1 14.3  HCT 34.6* 42.0  PLT 184 --  APTT -- --  LABPROT 13.9 --  INR 1.05 --  HEPARINUNFRC -- --  CREATININE 0.65 --  CKTOTAL -- --  CKMB -- --  TROPONINI -- --   Estimated Creatinine Clearance: 89.3 ml/min (by C-G formula based on Cr of 0.65).   Medications:  Scheduled:    . ceFAZolin (ANCEF) IV  1 g Intravenous Q6H  . clonazePAM  0.75 mg Oral QHS  . docusate sodium  100 mg Oral BID  . enoxaparin  40 mg Subcutaneous Q24H  . hydrochlorothiazide  25 mg Oral Daily  . levothyroxine  125 mcg Oral Daily  . patient's guide to using coumadin book   Does not apply Once  . venlafaxine  150 mg Oral Daily  . warfarin  5 mg Oral ONCE-1800  . warfarin   Does not apply Once  . DISCONTD: warfarin   Does not apply Once    Assessment: INR 1.05  POD#1 s/p L. THA Goal of Therapy:  INR 2-3   Plan:  Coumadin 6mg  tonight.  INR daily.  Arman Filter 09/07/2011,12:20 PM

## 2011-09-07 NOTE — Progress Notes (Signed)
Physical Therapy Treatment Patient Details Name: Dorothy [name] MRN: [id number] DOB: 1955-12-03 Today's Date: 09/07/2011  PT Assessment/Plan  PT - Assessment/Plan Comments on Treatment Session: Pt re-educated on 3/3 hip precautions. Pt also educated on TDWB status.  PT Plan: Discharge plan remains appropriate PT Frequency: 7X/week Follow Up Recommendations: Skilled nursing facility Equipment Recommended: Defer to next venue PT Goals  Acute Rehab PT Goals PT Goal Formulation: With patient Time For Goal Achievement: 7 days Pt will go Supine/Side to Sit: with min assist PT Goal: Supine/Side to Sit - Progress: Met Pt will go Sit to Supine/Side: with min assist PT Goal: Sit to Supine/Side - Progress: Not met Pt will Transfer Sit to Stand/Stand to Sit: with min assist PT Transfer Goal: Sit to Stand/Stand to Sit - Progress: Met Pt will Ambulate: 16 - 50 feet;with min assist;with rolling walker PT Goal: Ambulate - Progress: Met Pt will Perform Home Exercise Program: with min assist PT Goal: Perform Home Exercise Program - Progress: Progressing toward goal Additional Goals Additional Goal #1: pt. will consistently maintain TDWB status during mobility PT Goal: Additional Goal #1 - Progress: Not met  PT Treatment Precautions/Restrictions  Precautions Precautions: Posterior Hip Precaution Booklet Issued: Yes (comment) Precaution Comments: posterior total hip handout provided and pt. educated Restrictions Weight Bearing Restrictions: Yes LLE Weight Bearing: Touchdown weight bearing Other Position/Activity Restrictions: posterior total hip precautions Mobility (including Balance) Bed Mobility Bed Mobility: Yes Supine to Sit: 4: Min assist;With rails;HOB elevated (Comment degrees) Supine to Sit Details (indicate cue type and reason): A to positioning of LLE.  Cues for safe technique.  Transfers: Yes Sit to Stand: Other (comment);From bed (MinGuard A ) Sit to Stand Details (indicate  cue type and reason): Cues for safe hand placement Stand to Sit: Other (comment);To chair/3-in-1 (MinGuard A) Stand to Sit Details: Cues for safe hand placement and to slide out LLE Ambulation/Gait Ambulation/Gait: Yes Ambulation/Gait Assistance: 4: Min assist Ambulation/Gait Assistance Details (indicate cue type and reason): Max cues to ensure TDWB on LLE. Cues/A for RW safety Ambulation Distance (Feet): 40 Feet Assistive device: Rolling walker Gait Pattern: Antalgic;Trunk flexed Stairs: No Wheelchair Mobility Wheelchair Mobility: No  Exercise  Total Joint Exercises Ankle Circles/Pumps: AROM;Left;10 reps;Seated Quad Sets: Seated;10 reps;Left;Strengthening;AROM Gluteal Sets: AROM;Strengthening;Left;10 reps;Seated Heel Slides: AAROM;Strengthening;Left;10 reps;Seated Hip ABduction/ADduction: AAROM;Strengthening;Left;10 reps;Seated End of Session PT - End of Session Equipment Utilized During Treatment: Gait belt Activity Tolerance: Patient tolerated treatment well Patient left: in chair;with call bell in reach Nurse Communication: Weight bearing status (posterior THR precautions posted) General Behavior During Session: [facility] for tasks performed Cognition: [facility] for tasks performed  Clavin Ruhlman, [name] 09/07/2011, 2:59 PM 09/07/2011 [name] PTA [phone] pager [phone] office

## 2011-09-07 NOTE — Progress Notes (Signed)
Subjective: Doing well.  Pain improved somewhat this morning.  Lives alone, so needs short snf placement  Objective: Vital signs in last 24 hours: Temp:  [97.1 F (36.2 C)-98.5 F (36.9 C)] 98.5 F (36.9 C) (11/15 0532) Pulse Rate:  [69-84] 83  (11/15 0532) Resp:  [15-19] 18  (11/15 0532) BP: (104-158)/(63-70) 132/70 mmHg (11/15 0532) SpO2:  [5 %-97 %] 95 % (11/15 0532) Weight:  [87.1 kg (192 lb 0.3 oz)] 192 lb 0.3 oz (87.1 kg) (11/14 2145)  Intake/Output from previous day: 11/14 0701 - 11/15 0700 In: 2680 [P.O.:480; I.V.:2000; IV Piggyback:200] Out: 4300 [Urine:4050; Blood:250] Intake/Output this shift: Total I/O In: -  Out: 1000 [Urine:1000]   Basename 09/07/11 0640 09/06/11 0814  HGB 12.1 14.3    Basename 09/07/11 0640 09/06/11 0814  WBC 10.2 --  RBC 4.11 --  HCT 34.6* 42.0  PLT 184 --    Basename 09/06/11 0814  NA 142  K 3.7  CL --  CO2 --  BUN --  CREATININE --  GLUCOSE 87  CALCIUM --   No results found for this basename: LABPT:2,INR:2 in the last 72 hours  Neurovascular intact Compartment soft Dressing clean.  Assessment/Plan: Needs short snf placement.  Patient prefers [facility].  Transfer fri or sat if bed avail.  Change percocet to 10/325.    Gwenlyn Hottinger M 09/07/2011, 7:49 AM

## 2011-09-07 NOTE — Progress Notes (Signed)
Spoke with patient regarding Home Health needs, states she is going to SNF for shortterm rehab. Wants to go to [facility]. She was preoperatively setup with [facility]. Has her walker and 3in1. Will follow.

## 2011-09-07 NOTE — Progress Notes (Signed)
Physical Therapy Evaluation Patient Details Name: Dorothy [name] MRN: [id number] DOB: 10-Sep-1956 Today's Date: 09/07/2011  Problem List: There is no problem list on file for this patient.   Past Medical History:  Past Medical History  Diagnosis Date  . Pneumonia   . Hypertension   . Constipation   . Hypothyroidism   . Cancer     ovarian   Past Surgical History:  Past Surgical History  Procedure Date  . Abdominal hysterectomy   . Total hip arthroplasty 09/06/2011    Procedure: TOTAL HIP ARTHROPLASTY;  Surgeon: Loreta Ave, MD;  Location: [facility] OR;  Service: Orthopedics;  Laterality: Left;  TOTAL HIP ARTHROPLASTY LEFT SIDE    PT Assessment/Plan/Recommendation PT Assessment Clinical Impression Statement: Pt. is s/p left THR with posterior precautions.  Will need acute and post-acute f/u for maximizing independence prior to return home due to mobility dependencies. PT Recommendation/Assessment: Patient will need skilled PT in the acute care venue PT Problem List: Decreased strength;Decreased activity tolerance;Decreased balance;Decreased mobility;Decreased knowledge of use of DME;Decreased knowledge of precautions Barriers to Discharge: Decreased caregiver support PT Therapy Diagnosis : Difficulty walking;Acute pain PT Plan PT Frequency: 7X/week PT Treatment/Interventions: DME instruction;Gait training;Functional mobility training;Therapeutic exercise;Balance training;Patient/family education PT Recommendation Follow Up Recommendations: Skilled nursing facility Equipment Recommended: None recommended by PT PT Goals  Acute Rehab PT Goals PT Goal Formulation: With patient Time For Goal Achievement: 7 days Pt will go Supine/Side to Sit: with min assist PT Goal: Supine/Side to Sit - Progress: Progressing toward goal Pt will go Sit to Supine/Side: with min assist PT Goal: Sit to Supine/Side - Progress: Not met Pt will Transfer Sit to Stand/Stand to Sit: with min assist PT  Transfer Goal: Sit to Stand/Stand to Sit - Progress: Progressing toward goal Pt will Ambulate: 16 - 50 feet;with min assist;with rolling walker PT Goal: Ambulate - Progress: Progressing toward goal Pt will Perform Home Exercise Program: with min assist PT Goal: Perform Home Exercise Program - Progress: Progressing toward goal Additional Goals Additional Goal #1: pt. will consistently maintain TDWB status during mobility PT Goal: Additional Goal #1 - Progress: Not met  PT Evaluation Precautions/Restrictions  Precautions Precautions: Posterior Hip Precaution Booklet Issued: Yes (comment) Precaution Comments: posterior total hip handout provided and pt. educated Restrictions Weight Bearing Restrictions: Yes LLE Weight Bearing: Touchdown weight bearing Other Position/Activity Restrictions: posterior total hip precautions Prior Functioning  Home Living Lives With: Alone Additional Comments: all home living infor obtained during PT/OT co-eval Prior Function Level of Independence:  (as per OT entry during PT/OT co-eval) Cognition Cognition Arousal/Alertness: Awake/alert Overall Cognitive Status: Appears within functional limits for tasks assessed Orientation Level: Oriented X4 Sensation/Coordination Sensation Light Touch: Appears Intact (lower extremities) Extremity Assessment RLE Assessment RLE Assessment: Within Functional Limits LLE Assessment LLE Assessment: Exceptions to [facility] LLE Strength Left Knee Extension: 2/5 Left Ankle Dorsiflexion: 4/5 Mobility (including Balance) Bed Mobility Bed Mobility: Yes Supine to Sit: 1: +2 Total assist;Patient percentage (comment) (pt. 50%) Supine to Sit Details (indicate cue type and reason): multimodal cues for safe technique and posterior THR precautions Sit to Supine - Right: Not Tested (comment) Transfers Transfers: Yes Sit to Stand: 1: +2 Total assist;Patient percentage (comment) (pt. 70%) Sit to Stand Details (indicate cue type and  reason): cues for hand placement, safe technique and TDWB Stand to Sit: 1: +2 Total assist;Patient percentage (comment) (pt. 70%) Stand to Sit Details: cues for safe technique and hip precautions Ambulation/Gait Ambulation/Gait: Yes Ambulation/Gait Assistance: 1: +2 Total assist;Patient percentage (comment) (pt.  70%) Ambulation/Gait Assistance Details (indicate cue type and reason): cues for sequence and for TDWB Ambulation Distance (Feet): 2 Feet Assistive device: Rolling walker Gait Pattern:  (increased stride left LE and tends to exceed TDWB)  Posture/Postural Control Posture/Postural Control: No significant limitations Balance Balance Assessed: Yes Static Sitting Balance Static Sitting - Level of Assistance: 6: Modified independent (Device/Increase time) Static Standing Balance Static Standing - Level of Assistance: 3: Mod assist Exercise  Total Joint Exercises Ankle Circles/Pumps: AROM;Both;10 reps Quad Sets: AROM;Left;10 reps Heel Slides: AAROM;Left;5 reps End of Session PT - End of Session Equipment Utilized During Treatment: Gait belt Activity Tolerance: Patient limited by fatigue;Patient limited by pain (limited by TDWBstatus) Patient left: in chair;with call bell in reach Nurse Communication: Weight bearing status (posterior THR precautions posted) General Behavior During Session: [facility] for tasks performed Cognition: [facility] for tasks performed  Ferman Hamming 09/07/2011, 1:01 PM Acute Rehabilitation Services [phone] (pager)

## 2011-09-08 ENCOUNTER — Inpatient Hospital Stay
Admission: AD | Admit: 2011-09-08 | Discharge: 2011-09-12 | Disposition: A | Discharge status: Home / Self Care | Payer: PRIVATE HEALTH INSURANCE | Source: Ambulatory Visit | Attending: Internal Medicine | Admitting: Internal Medicine

## 2011-09-08 LAB — PROTIME-INR
INR: 2.14 — ABNORMAL HIGH (ref 0.00–1.49)
Prothrombin Time: 24.3 seconds — ABNORMAL HIGH (ref 11.6–15.2)

## 2011-09-08 LAB — CBC
HCT: 35.6 % — ABNORMAL LOW (ref 36.0–46.0)
Hemoglobin: 12.6 g/dL (ref 12.0–15.0)
MCH: 29.6 pg (ref 26.0–34.0)
MCHC: 35.4 g/dL (ref 30.0–36.0)
RDW: 14.3 % (ref 11.5–15.5)

## 2011-09-08 NOTE — Progress Notes (Signed)
Covering Clinical Social Worker contacted [facility] who stated that they are waiting to get approval from pt. Insurance. Clinical Social Worker is waiting for a call back. At this moment pt. Has no other offers from any other facilities. This Clinical Social Worker and or Clinical Social Worker Erie Noe will follow up. Theresia Bough, MSW, Theresia Majors [phone]

## 2011-09-08 NOTE — Discharge Summary (Signed)
NAME:  Dorothy [name], Dorothy [name] NO.:  [id number]  MEDICAL RECORD NO.:  [id number]  LOCATION:  5005                         FACILITY:  [facility]  PHYSICIAN:  Loreta Ave, M.D. DATE OF BIRTH:  [birth year]  DATE OF ADMISSION:  09/06/2011 DATE OF DISCHARGE:                              DISCHARGE SUMMARY   FINAL DIAGNOSES: 1. 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 2. Hypertension. 3. Hyperlipidemia. 4. Hypothyroidism. 5. Tobacco use.  HISTORY OF PRESENT ILLNESS:  A 55 year old white female with history of left hip AVN and pain, presented to our office for preop evaluation for total hip replacement.  She had progressive worsening pain with failed response with conservative treatment.  Significant decrease in her daily activities due to the ongoing complaint.  The patient also has known history of right hip AVN and will be needing total hip replacement there in the future.  HOSPITAL COURSE:  On September 06, 2011, the patient taken to the [facility] OR and a left total hip replacement procedure performed.  SURGEON:  Loreta Ave, MD  ASSISTANT:  Genene Churn. Barry Dienes, [facility]  ANESTHESIA:  General.  ESTIMATED BLOOD LOSS:  minimal.  ANESTHESIA:  General.  SPECIMENS:  None.  CULTURES:  none.  The patient tolerated procedure well and was transferred to recovery in stable condition.  After the recovery room, the patient was then transferred to the Orthopedic Unit.  Pharmacy protocol, Coumadin and Lovenox started for DVT prophylaxis.  On September 07, 2011, the patient doing well.  She was having some pain late in the evening, but this is improved this morning.  The patient lives alone and is requiring short skilled nursing facility placement.  She is wanting to transfer to [facility].  Vital signs stable and afebrile.  Hemoglobin 12.1, hematocrit 34.6.  Left hip dressing clean, dry, intact.  Calf nontender, neurovascularly  intact.  Skin warm and dry.  PT/OT consults.  On September 08, 2011, the patient doing well.  Pain controlled.  She is ready for transfer to a skilled nursing facility.  Vital signs stable and afebrile.  Hemoglobin 12.6, WBC 11.9, hematocrit 35.6, platelets 183.  Sodium 141, potassium 3.8, chloride 105, CO2 of 28, BUN 7, creatinine 0.65 glucose 118, INR 2.14.  Left hip wound looks good and staples intact.  No drainage or signs of infection.  The patient doing very well and ready for transfer.  Saline locked IV.  Discontinued Foley.  DISPOSITION:  Transfer to a skilled nursing facility for rehab.  CONDITION:  Good and stable.  MEDICATIONS: 1. Percocet 10/325 1-2 tabs p.o. q.4-6 hours p.r.n. for pain. 2. Robaxin 500 mg 1 tab p.o. q.6 hours p.r.n. for spasms. 3. Coumadin per pharmacy protocol.  Maintain INR 2-3 x4 weeks postop     for DVT prophylaxis. 4. Klonopin 0.75 mg 1 tab p.o. at bedtime. 5. Colace 100 mg 1 tab p.o. b.i.d. 6. Hydrochlorothiazide 25 mg 1 tab p.o. daily. 7. Synthroid 125 mcg 1 tab p.o. daily.  INSTRUCTIONS:  While at the skilled facility, the patient continue PT/OT to improve ambulation and strengthening.  She is touchdown weightbearing left lower extremity times  3-4 weeks postop.  Daily dressing changes with 4 x 4 gauze and tape.  Do not apply any creams or ointments to her incision.  Coumadin x4 weeks postop for DVT prophylaxis.  Maintain INR 2- 3.  Okay to shower but no tub soaking.  Needs return office visit with Dr. Eulah Pont when she is 2 weeks postop for recheck and I will likely remove staples at that time.  Contact our office at [phone], if there are any questions or concerns regarding the left hip.     Genene Churn. Denton Meek.   ______________________________ Loreta Ave, M.D.    JMO/MEDQ  D:  09/08/2011  T:  09/08/2011  Job:  [id number]

## 2011-09-08 NOTE — Progress Notes (Signed)
Agree with updated goals.  Weldon Picking, PT

## 2011-09-08 NOTE — Progress Notes (Signed)
Physical Therapy Treatment Patient Details Name: Dorothy [name] MRN: [id number] DOB: [birth year] Today's Date: 09/08/2011  PT Assessment/Plan  PT - Assessment/Plan Comments on Treatment Session: Pt continuing to progress. Still requires cues to ensure TDWB and precautions. A still required for bed mobility.  PT Plan: Discharge plan remains appropriate PT Frequency: 7X/week Follow Up Recommendations: Skilled nursing facility Equipment Recommended: Defer to next venue PT Goals  Acute Rehab PT Goals Pt will go Supine/Side to Sit: with supervision PT Goal: Supine/Side to Sit - Progress: Progressing toward goal Pt will go Sit to Supine/Side: with supervision PT Goal: Sit to Supine/Side - Progress: Revised (modified due to lack of progress/goal met) Pt will Transfer Sit to Stand/Stand to Sit: with modified independence PT Transfer Goal: Sit to Stand/Stand to Sit - Progress: Progressing toward goal Pt will Ambulate: 16 - 50 feet;with supervision;with rolling walker PT Goal: Ambulate - Progress: Progressing toward goal PT Treatment Precautions/Restrictions  Precautions Precautions: Posterior Hip Precaution Booklet Issued: Yes (comment) Precaution Comments: posterior total hip handout provided and pt. educated Restrictions Weight Bearing Restrictions: Yes LLE Weight Bearing: Touchdown weight bearing Other Position/Activity Restrictions: posterior total hip precautions Mobility (including Balance) Bed Mobility Bed Mobility: Yes Supine to Sit: 4: Min assist Supine to Sit Details (indicate cue type and reason): A for LE. Cues for safe technique.  Transfers Transfers: Yes Sit to Stand: 5: Supervision;From bed;From chair/3-in-1 Sit to Stand Details (indicate cue type and reason): Cues for safe hand placement with RW and to ensure TDWB on LLE Stand to Sit: 5: Supervision;To chair/3-in-1 Stand to Sit Details: Cues to slide out LLE Ambulation/Gait Ambulation/Gait: Yes Ambulation/Gait  Assistance: Other (comment) (MinGuard A) Ambulation/Gait Assistance Details (indicate cue type and reason): Cues to ensure LLE TDWB/ cues for safety Ambulation Distance (Feet): 70 Feet Assistive device: Rolling walker Gait Pattern: Trunk flexed (hop to pattern) Gait velocity: decreased.     Exercise  End of Session PT - End of Session Equipment Utilized During Treatment: Gait belt Activity Tolerance: Patient tolerated treatment well Patient left: in chair Nurse Communication: Mobility status for transfers General Behavior During Session: [name] for tasks performed Cognition: [name] for tasks performed  , [name] 09/08/2011, 2:29 PM 09/08/2011 [name] PTA [phone] pager [phone] office

## 2011-09-08 NOTE — Progress Notes (Signed)
Clinical Social Work-Please see full assessment in shadow chart-pt assessed and faxed out to [place] on 11/15. FL2 faxed to MD for signature. No bed offers as of 0945 this morning. Will expand bed search to [facility] with pts permission and facilitate d/c as soon as facility identified-Ana Woodroof-MSW, [phone]

## 2011-09-08 NOTE — Progress Notes (Signed)
Occupational Therapy Treatment Patient Details Name: Dorothy [name] MRN: [id number] DOB: August 12, 1956 Today's Date: 09/08/2011  OT Assessment/Plan OT Assessment/Plan OT Plan: Discharge plan remains appropriate OT Frequency: Min 1X/week Follow Up Recommendations: Skilled nursing facility Equipment Recommended: Defer to next venue OT Goals Acute Rehab OT Goals OT Goal Formulation: With patient Time For Goal Achievement: 7 days ADL Goals Pt Will Perform Grooming: with modified independence;Standing at sink;Other (comment) ADL Goal: Grooming - Progress: Not addressed Pt Will Perform Lower Body Bathing: with modified independence;Sitting, edge of bed;with adaptive equipment;Other (comment) ADL Goal: Lower Body Bathing - Progress: Other (comment) Pt Will Perform Lower Body Dressing: with modified independence;Sit to stand from bed;with adaptive equipment;Other (comment) ADL Goal: Lower Body Dressing - Progress: Progressing toward goals Pt Will Transfer to Toilet: with modified independence;Stand pivot transfer;with DME;3-in-1 ADL Goal: Toilet Transfer - Progress: Progressing toward goals Pt Will Perform Toileting - Clothing Manipulation: with modified independence;Standing;Other (comment) ADL Goal: Toileting - Clothing Manipulation - Progress: Progressing toward goals Pt Will Perform Toileting - Hygiene: with modified independence;Sit to stand from 3-in-1/toilet;Other (comment) ADL Goal: Toileting - Hygiene - Progress: Not addressed  OT Treatment Precautions/Restrictions  Precautions Precautions: Posterior Hip Restrictions Weight Bearing Restrictions: Yes LLE Weight Bearing: Touchdown weight bearing   ADL ADL Upper Body Bathing: Performed;Set up Where Assessed - Upper Body Bathing: Standing at sink Lower Body Bathing: Performed;Moderate assistance Where Assessed - Lower Body Bathing: Sit to stand from chair Upper Body Dressing: Performed;Independent Where Assessed - Upper Body  Dressing: Sitting, chair Lower Body Dressing: Performed;Minimal assistance Where Assessed - Lower Body Dressing: Sit to stand from chair Toilet Transfer: Simulated;Supervision/safety Toilet Transfer Details (indicate cue type and reason): VC for TWB Toilet Transfer Method: Stand pivot Toileting - Clothing Manipulation: Simulated;Supervision/safety Toileting - Clothing Manipulation Details (indicate cue type and reason): Supervision for TWB Where Assessed - Toileting Clothing Manipulation: Standing ADL Comments: Pt requires max VC for TWB status and min VC for maintaining posterior hip precautions while performing ADLs. Mobility  Bed Mobility Bed Mobility: No Supine to Sit: 4: Min assist Supine to Sit Details (indicate cue type and reason): A with LLE. Cues for safe positioning and not to turn in LLE. Transfers Sit to Stand: From bed;Other (comment);From chair/3-in-1 (minguard a) Sit to Stand Details (indicate cue type and reason): Cues for safe hand placement with RW and to ensure TDWB on LLE Stand to Sit: To chair/3-in-1;Other (comment) (MinGuard A) Stand to Sit Details: Cues to slide out LLE Exercises Total Joint Exercises Ankle Circles/Pumps: AROM;Left;10 reps;Seated Quad Sets: Seated;10 reps;Strengthening;Left;AROM Gluteal Sets: AROM;Strengthening;Left;10 reps;Seated Short Arc Quad: AAROM;Strengthening;Left;10 reps;Seated Heel Slides: AAROM;Strengthening;Left;10 reps;Seated Hip ABduction/ADduction: AAROM;Strengthening;Left;10 reps;Seated  End of Session OT - End of Session Equipment Utilized During Treatment: Gait belt Activity Tolerance: Patient tolerated treatment well Patient left: in chair;with call bell in reach General Behavior During Session: [facility] for tasks performed Cognition: [facility] for tasks performed  Cipriano Mile  09/08/2011 Cipriano Mile OTR/L Pager [phone] Office [phone]  09/08/2011, 12:32 PM

## 2011-09-08 NOTE — Progress Notes (Signed)
ANTICOAGULATION CONSULT NOTE - Follow Up Consult  Pharmacy Consult for Coumadin Indication: VTE prophylaxis  Allergies  Allergen Reactions  . Sulfa Antibiotics Rash    Patient Measurements: Height: 5\' 6"  (167.6 cm) Weight: 192 lb 0.3 oz (87.1 kg) IBW/kg (Calculated) : 59.3   Vital Signs: Temp: 98.9 F (37.2 C) (11/16 0545) Temp src: Oral (11/16 0545) BP: 127/74 mmHg (11/16 0545) Pulse Rate: 85  (11/16 0545)  Labs:  Basename 09/08/11 0710 09/07/11 0640 09/06/11 0814  HGB 12.6 12.1 --  HCT 35.6* 34.6* 42.0  PLT 183 184 --  APTT -- -- --  LABPROT 24.3* 13.9 --  INR 2.14* 1.05 --  HEPARINUNFRC -- -- --  CREATININE -- 0.65 --  CKTOTAL -- -- --  CKMB -- -- --  TROPONINI -- -- --   Estimated Creatinine Clearance: 89.3 ml/min (by C-G formula based on Cr of 0.65).  Assessment: INR 2.14 up from 1.05 after 2 days of coumadin for vte prophylaxis s/p L. THA.  No bleeding reported.  H/H 12.6/35.6, pltc 183. Lovenox to be DC'd when INR =/>1.8  Goal of Therapy:  INR 2-3   Plan:  Hold coumadin dose today.  Daily INR. Will DC the Lovenox today.   Noah Delaine Prescott 09/08/2011,11:04 AM

## 2011-09-08 NOTE — Progress Notes (Signed)
Subjective: Doing well.  Pain controlled.  Ready for transfer to snf for rehab.   Objective: Vital signs in last 24 hours: Temp:  [98.7 F (37.1 C)-98.9 F (37.2 C)] 98.9 F (37.2 C) (11/16 0545) Pulse Rate:  [74-85] 85  (11/16 0545) Resp:  [18] 18  (11/16 0545) BP: (109-127)/(61-74) 127/74 mmHg (11/16 0545) SpO2:  [92 %-96 %] 92 % (11/16 0545)  Intake/Output from previous day: 11/15 0701 - 11/16 0700 In: 760 [P.O.:240; I.V.:520] Out: 1200 [Urine:1200] Intake/Output this shift: Total I/O In: 240 [P.O.:240] Out: -    Basename 09/08/11 0710 09/07/11 0640 09/06/11 0814  HGB 12.6 12.1 14.3    Basename 09/08/11 0710 09/07/11 0640  WBC 11.9* 10.2  RBC 4.25 4.11  HCT 35.6* 34.6*  PLT 183 184    Basename 09/07/11 0640 09/06/11 0814  NA 141 142  K 3.8 3.7  CL 105 --  CO2 28 --  BUN 7 --  CREATININE 0.65 --  GLUCOSE 118* 87  CALCIUM 8.7 --    Basename 09/08/11 0710 09/07/11 0640  LABPT -- --  INR 2.14* 1.05    Neurovascular intact Compartment soft Wound looks good. Staples intact.  No drainage or signs of infection.  Assessment/Plan: Saline lock iv.  Transfer to snf today or Saturday if bed available.     Dorothy [name] M 09/08/2011, 12:26 PM

## 2011-09-08 NOTE — Progress Notes (Signed)
Dorothy [name] is being discharged to [facility] for short-term rehab today. Discharge information has been forwarded to facility. Dorothy [name] will be transported to facility by ambulance.  Genelle Bal, MSW, LCSW [phone]

## 2011-09-08 NOTE — Progress Notes (Signed)
Physical Therapy Treatment Patient Details Name: Dorothy [name] MRN: [id number] DOB: 04/14/1956 Today's Date: 09/08/2011  PT Assessment/Plan  PT - Assessment/Plan Comments on Treatment Session: Pt able to verbalize 3/3 posterior hip precautions today and maintaining TDWB much better today PT Plan: Discharge plan remains appropriate PT Frequency: 7X/week Follow Up Recommendations: Skilled nursing facility Equipment Recommended: Defer to next venue PT Goals  Acute Rehab PT Goals Pt will go Supine/Side to Sit: with supervision PT Goal: Supine/Side to Sit - Progress: Revised (modified due to lack of progress/goal met) Pt will go Sit to Supine/Side: with supervision PT Goal: Sit to Supine/Side - Progress: Revised (modified due to lack of progress/goal met) Pt will Transfer Sit to Stand/Stand to Sit: with modified independence Pt will Ambulate: 16 - 50 feet;with supervision;with rolling walker PT Goal: Ambulate - Progress: Revised (modified due to lack of progress/goal met) PT Goal: Perform Home Exercise Program - Progress: Progressing toward goal  PT Treatment Precautions/Restrictions  Precautions Precautions: Posterior Hip Precaution Booklet Issued: Yes (comment) Precaution Comments: posterior total hip handout provided and pt. educated Restrictions Weight Bearing Restrictions: Yes LLE Weight Bearing: Touchdown weight bearing Other Position/Activity Restrictions: posterior total hip precautions Mobility (including Balance) Bed Mobility Supine to Sit: 4: Min assist Supine to Sit Details (indicate cue type and reason): A with LLE. Cues for safe positioning and not to turn in LLE. Transfers Transfers: Yes Sit to Stand: From bed;Other (comment);From chair/3-in-1 (minguard a) Sit to Stand Details (indicate cue type and reason): Cues for safe hand placement with RW and to ensure TDWB on LLE Stand to Sit: To chair/3-in-1;Other (comment) (MinGuard A) Stand to Sit Details: Cues to slide  out LLE Ambulation/Gait Ambulation/Gait: Yes Ambulation/Gait Assistance: 4: Min assist Ambulation/Gait Assistance Details (indicate cue type and reason): A for balance and safety. Cues to ensure TDWB on LLE. Pt much more  Ambulation Distance (Feet): 65 Feet Assistive device: Rolling walker Gait Pattern: Trunk flexed (hop to pattern) Gait velocity: decreased    Exercise  Total Joint Exercises Ankle Circles/Pumps: AROM;Left;10 reps;Seated Quad Sets: Seated;10 reps;Strengthening;Left;AROM Gluteal Sets: AROM;Strengthening;Left;10 reps;Seated Short Arc Quad: AAROM;Strengthening;Left;10 reps;Seated Heel Slides: AAROM;Strengthening;Left;10 reps;Seated Hip ABduction/ADduction: AAROM;Strengthening;Left;10 reps;Seated End of Session PT - End of Session Equipment Utilized During Treatment: Gait belt Activity Tolerance: Patient tolerated treatment well Patient left: in chair;with call bell in reach Nurse Communication: Mobility status for transfers General Behavior During Session: [facility] for tasks performed Cognition: [facility] for tasks performed  [name] 09/08/2011, 11:44 AM  09/08/2011 [name] PTA [phone] pager [phone] office

## 2011-09-29 ENCOUNTER — Encounter (HOSPITAL_COMMUNITY): Payer: Self-pay | Admitting: Pharmacy Technician

## 2011-10-05 ENCOUNTER — Encounter (HOSPITAL_COMMUNITY)
Admission: RE | Admit: 2011-10-05 | Discharge: 2011-10-05 | Disposition: A | Discharge status: Home / Self Care | Payer: PRIVATE HEALTH INSURANCE | Source: Ambulatory Visit | Attending: Orthopedic Surgery | Admitting: Orthopedic Surgery

## 2011-10-05 ENCOUNTER — Encounter (HOSPITAL_COMMUNITY): Payer: Self-pay

## 2011-10-05 HISTORY — DX: Major depressive disorder, single episode, unspecified: F32.9

## 2011-10-05 HISTORY — DX: Depression, unspecified: F32.A

## 2011-10-05 HISTORY — DX: Unspecified osteoarthritis, unspecified site: M19.90

## 2011-10-05 LAB — URINALYSIS, ROUTINE W REFLEX MICROSCOPIC
Glucose, UA: NEGATIVE mg/dL
Hgb urine dipstick: NEGATIVE
Ketones, ur: NEGATIVE mg/dL
Protein, ur: NEGATIVE mg/dL
Urobilinogen, UA: 0.2 mg/dL (ref 0.0–1.0)

## 2011-10-05 LAB — TYPE AND SCREEN: Antibody Screen: NEGATIVE

## 2011-10-05 LAB — COMPREHENSIVE METABOLIC PANEL
Alkaline Phosphatase: 89 U/L (ref 39–117)
BUN: 13 mg/dL (ref 6–23)
CO2: 27 mEq/L (ref 19–32)
Calcium: 9.5 mg/dL (ref 8.4–10.5)
GFR calc Af Amer: >90 mL/min (ref >90)
GFR calc non Af Amer: >90 mL/min (ref >90)
Glucose, Bld: 88 mg/dL (ref 70–99)
Potassium: 4.2 mEq/L (ref 3.5–5.1)
Total Protein: 6.9 g/dL (ref 6.0–8.3)

## 2011-10-05 LAB — CBC
HCT: 40.9 % (ref 36.0–46.0)
Hemoglobin: 14.6 g/dL (ref 12.0–15.0)
MCH: 29.6 pg (ref 26.0–34.0)
MCHC: 35.7 g/dL (ref 30.0–36.0)
RBC: 4.93 MIL/uL (ref 3.87–5.11)

## 2011-10-05 LAB — PROTIME-INR: Prothrombin Time: 13.8 seconds (ref 11.6–15.2)

## 2011-10-05 LAB — SURGICAL PCR SCREEN: MRSA, PCR: NEGATIVE

## 2011-10-05 MED ORDER — CHLORHEXIDINE GLUCONATE 4 % EX LIQD: 60.0000 mL | Freq: Once | CUTANEOUS | Status: DC

## 2011-10-05 NOTE — Progress Notes (Signed)
Requested EKG , done last month at [facility] to be sent here.

## 2011-10-05 NOTE — Pre-Procedure Instructions (Signed)
20 Dorothy [name]  10/05/2011   Your procedure is scheduled on:Wed,Dec 19 @ 1:00pm Report to [facility] at 1000 AM.  Call this number if you have problems the morning of surgery: [phone]   Remember:   Do not eat food:After Midnight.  May have clear liquids: up to 4 Hours before arrival.(until 6am)  Clear liquids include soda, tea, black coffee, apple or grape juice, broth.  Take these medicines the morning of surgery with A SIP OF WATER: Clonazepam,Levothyroxine,Effexor   Do not wear jewelry, make-up or nail polish.  Do not wear lotions, powders, or perfumes. You may wear deodorant.  Do not shave 48 hours prior to surgery.  Do not bring valuables to the hospital.  Contacts, dentures or bridgework may not be worn into surgery.  Leave suitcase in the car. After surgery it may be brought to your room.  For patients admitted to the hospital, checkout time is 11:00 AM the day of discharge.   Patients discharged the day of surgery will not be allowed to drive home.  Name and phone number of your driver:   Special Instructions: CHG Shower Use Special Wash: 1/2 bottle night before surgery and 1/2 bottle morning of surgery.   Please read over the following fact sheets that you were given: Pain Booklet, Coughing and Deep Breathing, Blood Transfusion Information, Total Joint Packet, MRSA Information and Surgical Site Infection Prevention

## 2011-10-09 NOTE — H&P (Signed)
  [facility] [address]   [address] [place], [place] [address] [phone] A Division of [facility]  Loreta Ave, M.D.     Robert A. Thurston Hole, M.D.     Lunette Stands, M.D. Eulas Post, M.D.    Buford Dresser, M.D. Estell Harpin, M.D. Ralene Cork, D.O.          Genene Churn. Barry Dienes, PA-C            Kirstin A. Shepperson, PA-C [place], OPA-C   RE: Dorothy [name], Dorothy [name]                                [id number]      DOB: 1956/03/25 PROGRESS NOTE: 10-03-11 Chief complaint: Right hip pain.  History of present illness: 63 four year-old white female with a history of right hip avascular necrosis and pain.  Returns.  States that hip symptoms are unchanged from previous visit.  She is wanting to proceed with right total hip replacement as scheduled next week.  She is about four weeks status post left total hip replacement and is recovering well.   Current medications: HCTZ, Estradiol, Clonazepam, Venlafaxine, Synthroid and Albuterol.   Allergies: Sulfa. Past medical/surgical history: Status post left total hip replacement in November of 2012, hypertension, hyperlipidemia and hypothyroidism. Review of systems: Patient denies fevers, chills, cardiac, pulmonary, GI or GU issues.   Family history: Positive for ovarian cancer and coronary artery disease. Social history: Smokes 1 packs per day, admits occasional alcohol use. Patient is a widow.  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         EXAMINATION: Height: 5?7.  Weight: 190 pounds.  Blood pressure: 120/80.  Pulse: 74.  Pleasant white female, alert and oriented x 3 and in no acute distress.  Gait is antalgic.  Head is normocephalic, a traumatic.  PERRLA, EOMI.  Neck unremarkable.  Lungs: CTA bilaterally.  No wheezes.  Heart: RRR.  S1 and S2.  Abdomen: Round and non-distended.  NBS x 4.  Soft and non-tender.  Right hip positive log roll.  Neurovascularly intact.   Skin warm and dry.    IMPRESSION: Right hip AVN.    PLAN:  We will proceed with right total hip replacement as scheduled next week.  Surgical procedure, along with potential rehab/recovery time discussed with her.  All questions answered.    Loreta Ave, M.D.   Electronically verified by Loreta Ave, M.D. DFM(JMO):jjh D 10-03-11

## 2011-10-10 MED ORDER — CEFAZOLIN SODIUM-DEXTROSE 2-3 GM-% IV SOLR
2.0000 g | INTRAVENOUS | Status: AC
  Administered 2011-10-11: 2 g via INTRAVENOUS
  Filled 2011-10-10: qty 50

## 2011-10-11 ENCOUNTER — Encounter (HOSPITAL_COMMUNITY): Payer: Self-pay

## 2011-10-11 ENCOUNTER — Encounter (HOSPITAL_COMMUNITY)
Admission: RE | Disposition: A | Discharge status: Home / Self Care | Payer: Self-pay | Source: Ambulatory Visit | Attending: Orthopedic Surgery

## 2011-10-11 ENCOUNTER — Inpatient Hospital Stay (HOSPITAL_COMMUNITY): Payer: PRIVATE HEALTH INSURANCE

## 2011-10-11 ENCOUNTER — Encounter (HOSPITAL_COMMUNITY): Payer: Self-pay | Admitting: Anesthesiology

## 2011-10-11 ENCOUNTER — Inpatient Hospital Stay (HOSPITAL_COMMUNITY)
Admission: RE | Admit: 2011-10-11 | Discharge: 2011-10-13 | DRG: 470 | Disposition: A | Discharge status: Home / Self Care | Payer: PRIVATE HEALTH INSURANCE | Source: Ambulatory Visit | Attending: Orthopedic Surgery | Admitting: Orthopedic Surgery

## 2011-10-11 ENCOUNTER — Inpatient Hospital Stay (HOSPITAL_COMMUNITY): Payer: PRIVATE HEALTH INSURANCE | Admitting: Anesthesiology

## 2011-10-11 DIAGNOSIS — E039 Hypothyroidism, unspecified: Secondary | ICD-10-CM | POA: Diagnosis present

## 2011-10-11 DIAGNOSIS — Z882 Allergy status to sulfonamides status: Secondary | ICD-10-CM

## 2011-10-11 DIAGNOSIS — I10 Essential (primary) hypertension: Secondary | ICD-10-CM | POA: Diagnosis present

## 2011-10-11 DIAGNOSIS — Z01812 Encounter for preprocedural laboratory examination: Secondary | ICD-10-CM

## 2011-10-11 DIAGNOSIS — M1610 Unilateral primary osteoarthritis, unspecified hip: Secondary | ICD-10-CM | POA: Diagnosis present

## 2011-10-11 DIAGNOSIS — Z471 Aftercare following joint replacement surgery: Secondary | ICD-10-CM

## 2011-10-11 DIAGNOSIS — M87059 Idiopathic aseptic necrosis of unspecified femur: Principal | ICD-10-CM | POA: Diagnosis present

## 2011-10-11 HISTORY — PX: TOTAL HIP ARTHROPLASTY: SHX124

## 2011-10-11 SURGERY — ARTHROPLASTY, HIP, TOTAL,POSTERIOR APPROACH
Anesthesia: General | Site: Hip | Laterality: Right | Wound class: Clean

## 2011-10-11 MED ORDER — LACTATED RINGERS IV SOLN
INTRAVENOUS | Status: DC
  Administered 2011-10-11: 12:00:00 via INTRAVENOUS

## 2011-10-11 MED ORDER — SODIUM CHLORIDE 0.9 % IR SOLN
Status: DC | PRN
  Administered 2011-10-11: 1

## 2011-10-11 MED ORDER — BUPIVACAINE HCL (PF) 0.25 % IJ SOLN
INTRAMUSCULAR | Status: DC | PRN
  Administered 2011-10-11: 23 mL

## 2011-10-11 MED ORDER — MIDAZOLAM HCL 5 MG/5ML IJ SOLN
INTRAMUSCULAR | Status: DC | PRN
  Administered 2011-10-11: 2 mg via INTRAVENOUS

## 2011-10-11 MED ORDER — DIPHENHYDRAMINE HCL 12.5 MG/5ML PO ELIX
12.5000 mg | ORAL_SOLUTION | Freq: Four times a day (QID) | ORAL | Status: DC | PRN
  Filled 2011-10-11: qty 5

## 2011-10-11 MED ORDER — POTASSIUM CHLORIDE IN NACL 20-0.45 MEQ/L-% IV SOLN
INTRAVENOUS | Status: DC
  Administered 2011-10-11 – 2011-10-13 (×3): via INTRAVENOUS
  Filled 2011-10-11 (×5): qty 1000

## 2011-10-11 MED ORDER — NALOXONE HCL 0.4 MG/ML IJ SOLN: 0.4000 mg | INTRAMUSCULAR | Status: DC | PRN

## 2011-10-11 MED ORDER — ONDANSETRON HCL 4 MG/2ML IJ SOLN
INTRAMUSCULAR | Status: DC | PRN
  Administered 2011-10-11: 4 mg via INTRAVENOUS

## 2011-10-11 MED ORDER — METHOCARBAMOL 100 MG/ML IJ SOLN
500.0000 mg | Freq: Four times a day (QID) | INTRAVENOUS | Status: DC | PRN
  Filled 2011-10-11: qty 5

## 2011-10-11 MED ORDER — ONDANSETRON HCL 4 MG/2ML IJ SOLN: 4.0000 mg | Freq: Four times a day (QID) | INTRAMUSCULAR | Status: DC | PRN

## 2011-10-11 MED ORDER — NEOSTIGMINE METHYLSULFATE 1 MG/ML IJ SOLN
INTRAMUSCULAR | Status: DC | PRN
  Administered 2011-10-11: 4 mg via INTRAVENOUS

## 2011-10-11 MED ORDER — ROCURONIUM BROMIDE 100 MG/10ML IV SOLN
INTRAVENOUS | Status: DC | PRN
  Administered 2011-10-11: 10 mg via INTRAVENOUS
  Administered 2011-10-11: 50 mg via INTRAVENOUS

## 2011-10-11 MED ORDER — ENOXAPARIN SODIUM 40 MG/0.4ML ~~LOC~~ SOLN
40.0000 mg | SUBCUTANEOUS | Status: DC
  Administered 2011-10-12 – 2011-10-13 (×2): 40 mg via SUBCUTANEOUS
  Filled 2011-10-11 (×3): qty 0.4

## 2011-10-11 MED ORDER — HYDROMORPHONE HCL PF 1 MG/ML IJ SOLN
0.2500 mg | INTRAMUSCULAR | Status: DC | PRN
  Administered 2011-10-11 (×4): 0.5 mg via INTRAVENOUS

## 2011-10-11 MED ORDER — MENTHOL 3 MG MT LOZG: 1.0000 | LOZENGE | OROMUCOSAL | Status: DC | PRN

## 2011-10-11 MED ORDER — WARFARIN SODIUM 7.5 MG PO TABS
7.5000 mg | ORAL_TABLET | ORAL | Status: AC
  Administered 2011-10-11: 7.5 mg via ORAL
  Filled 2011-10-11: qty 1

## 2011-10-11 MED ORDER — CEFAZOLIN SODIUM 1-5 GM-% IV SOLN
1.0000 g | Freq: Four times a day (QID) | INTRAVENOUS | Status: AC
  Administered 2011-10-11 – 2011-10-12 (×3): 1 g via INTRAVENOUS
  Filled 2011-10-11 (×3): qty 50

## 2011-10-11 MED ORDER — IPRATROPIUM-ALBUTEROL 18-103 MCG/ACT IN AERO
INHALATION_SPRAY | RESPIRATORY_TRACT | Status: DC | PRN
  Administered 2011-10-11: 4 via RESPIRATORY_TRACT

## 2011-10-11 MED ORDER — MEPERIDINE HCL 25 MG/ML IJ SOLN: 6.2500 mg | INTRAMUSCULAR | Status: DC | PRN

## 2011-10-11 MED ORDER — PROPOFOL 10 MG/ML IV EMUL
INTRAVENOUS | Status: DC | PRN
  Administered 2011-10-11: 200 mg via INTRAVENOUS

## 2011-10-11 MED ORDER — METHOCARBAMOL 500 MG PO TABS
500.0000 mg | ORAL_TABLET | Freq: Four times a day (QID) | ORAL | Status: DC | PRN
  Administered 2011-10-12: 500 mg via ORAL
  Filled 2011-10-11: qty 1

## 2011-10-11 MED ORDER — ACETAMINOPHEN 325 MG PO TABS: 650.0000 mg | ORAL_TABLET | Freq: Four times a day (QID) | ORAL | Status: DC | PRN

## 2011-10-11 MED ORDER — FENTANYL CITRATE 0.05 MG/ML IJ SOLN
INTRAMUSCULAR | Status: DC | PRN
  Administered 2011-10-11: 100 ug via INTRAVENOUS
  Administered 2011-10-11: 50 ug via INTRAVENOUS
  Administered 2011-10-11 (×2): 100 ug via INTRAVENOUS
  Administered 2011-10-11: 50 ug via INTRAVENOUS

## 2011-10-11 MED ORDER — DIPHENHYDRAMINE HCL 50 MG/ML IJ SOLN: 12.5000 mg | Freq: Four times a day (QID) | INTRAMUSCULAR | Status: DC | PRN

## 2011-10-11 MED ORDER — ONDANSETRON HCL 4 MG PO TABS: 4.0000 mg | ORAL_TABLET | Freq: Four times a day (QID) | ORAL | Status: DC | PRN

## 2011-10-11 MED ORDER — PROMETHAZINE HCL 25 MG/ML IJ SOLN: 6.2500 mg | INTRAMUSCULAR | Status: DC | PRN

## 2011-10-11 MED ORDER — SODIUM CHLORIDE 0.9 % IJ SOLN: 9.0000 mL | INTRAMUSCULAR | Status: DC | PRN

## 2011-10-11 MED ORDER — MORPHINE SULFATE (PF) 1 MG/ML IV SOLN
INTRAVENOUS | Status: DC
  Administered 2011-10-11: 10.5 mg via INTRAVENOUS
  Administered 2011-10-11: 1.5 mg via INTRAVENOUS
  Administered 2011-10-12: 09:00:00 via INTRAVENOUS
  Administered 2011-10-12: 9 mg via INTRAVENOUS
  Administered 2011-10-12: 01:00:00 via INTRAVENOUS
  Filled 2011-10-11 (×2): qty 25

## 2011-10-11 MED ORDER — OXYCODONE-ACETAMINOPHEN 5-325 MG PO TABS
1.0000 | ORAL_TABLET | ORAL | Status: DC | PRN
  Administered 2011-10-12 – 2011-10-13 (×5): 2 via ORAL
  Filled 2011-10-11 (×5): qty 2

## 2011-10-11 MED ORDER — PATIENT'S GUIDE TO USING COUMADIN BOOK
Freq: Once | Status: AC
Start: 2011-10-11 — End: 2011-10-11
  Administered 2011-10-11: 21:00:00
  Filled 2011-10-11: qty 1

## 2011-10-11 MED ORDER — EPHEDRINE SULFATE 50 MG/ML IJ SOLN
INTRAMUSCULAR | Status: DC | PRN
  Administered 2011-10-11: 10 mg via INTRAVENOUS
  Administered 2011-10-11: 5 mg via INTRAVENOUS

## 2011-10-11 MED ORDER — LACTATED RINGERS IV SOLN
INTRAVENOUS | Status: DC | PRN
  Administered 2011-10-11 (×2): via INTRAVENOUS

## 2011-10-11 MED ORDER — PHENOL 1.4 % MT LIQD
1.0000 | OROMUCOSAL | Status: DC | PRN
  Filled 2011-10-11: qty 177

## 2011-10-11 MED ORDER — ACETAMINOPHEN 650 MG RE SUPP: 650.0000 mg | Freq: Four times a day (QID) | RECTAL | Status: DC | PRN

## 2011-10-11 MED ORDER — WARFARIN VIDEO
Freq: Once | Status: AC
  Administered 2011-10-12: 12:00:00

## 2011-10-11 MED ORDER — GLYCOPYRROLATE 0.2 MG/ML IJ SOLN
INTRAMUSCULAR | Status: DC | PRN
  Administered 2011-10-11: .8 mg via INTRAVENOUS

## 2011-10-11 MED ORDER — HYDROMORPHONE HCL PF 1 MG/ML IJ SOLN
INTRAMUSCULAR | Status: AC
  Filled 2011-10-11: qty 1

## 2011-10-11 MED ORDER — METHOCARBAMOL 100 MG/ML IJ SOLN
500.0000 mg | INTRAVENOUS | Status: AC
  Administered 2011-10-11: 500 mg via INTRAVENOUS
  Filled 2011-10-11: qty 5

## 2011-10-11 SURGICAL SUPPLY — 63 items
BLADE SAW SAG 73X25 THK (BLADE) ×1
BLADE SAW SGTL 73X25 THK (BLADE) ×1 IMPLANT
BOOTCOVER CLEANROOM LRG (PROTECTIVE WEAR) ×4 IMPLANT
BRUSH FEMORAL CANAL (MISCELLANEOUS) IMPLANT
CLOTH BEACON ORANGE TIMEOUT ST (SAFETY) ×2 IMPLANT
COVER BACK TABLE 24X17X13 BIG (DRAPES) IMPLANT
COVER SURGICAL LIGHT HANDLE (MISCELLANEOUS) ×2 IMPLANT
DRAPE INCISE IOBAN 66X45 STRL (DRAPES) IMPLANT
DRAPE ORTHO SPLIT 77X108 STRL (DRAPES) ×2
DRAPE SURG ORHT 6 SPLT 77X108 (DRAPES) ×2 IMPLANT
DRAPE U-SHAPE 47X51 STRL (DRAPES) ×2 IMPLANT
DRILL BIT 7/64X5 (BIT) ×2 IMPLANT
DRSG MEPILEX BORDER 4X12 (GAUZE/BANDAGES/DRESSINGS) IMPLANT
DRSG MEPILEX BORDER 4X8 (GAUZE/BANDAGES/DRESSINGS) ×4 IMPLANT
DRSG PAD ABDOMINAL 8X10 ST (GAUZE/BANDAGES/DRESSINGS) IMPLANT
DURAPREP 26ML APPLICATOR (WOUND CARE) ×2 IMPLANT
ELECT BLADE 6.5 EXT (BLADE) IMPLANT
ELECT CAUTERY BLADE 6.4 (BLADE) ×2 IMPLANT
ELECT REM PT RETURN 9FT ADLT (ELECTROSURGICAL) ×2
ELECTRODE REM PT RTRN 9FT ADLT (ELECTROSURGICAL) ×1 IMPLANT
EVACUATOR 1/8 PVC DRAIN (DRAIN) IMPLANT
FACESHIELD LNG OPTICON STERILE (SAFETY) ×4 IMPLANT
GAUZE XEROFORM 5X9 LF (GAUZE/BANDAGES/DRESSINGS) IMPLANT
GLOVE BIO SURGEON STRL SZ7.5 (GLOVE) ×2 IMPLANT
GLOVE BIOGEL PI IND STRL 8 (GLOVE) ×1 IMPLANT
GLOVE BIOGEL PI INDICATOR 8 (GLOVE) ×1
GLOVE NEODERM STER SZ 7 (GLOVE) ×2 IMPLANT
GLOVE ORTHO TXT STRL SZ7.5 (GLOVE) ×4 IMPLANT
GLOVE SURG SS PI 7.5 STRL IVOR (GLOVE) ×2 IMPLANT
GOWN PREVENTION PLUS XLARGE (GOWN DISPOSABLE) ×2 IMPLANT
GOWN STRL NON-REIN LRG LVL3 (GOWN DISPOSABLE) ×6 IMPLANT
GOWN STRL REIN 2XL LVL4 (GOWN DISPOSABLE) ×2 IMPLANT
HANDPIECE INTERPULSE COAX TIP (DISPOSABLE)
KIT BASIN OR (CUSTOM PROCEDURE TRAY) ×2 IMPLANT
KIT ROOM TURNOVER OR (KITS) ×2 IMPLANT
MANIFOLD NEPTUNE II (INSTRUMENTS) ×2 IMPLANT
NEEDLE HYPO 25GX1X1/2 BEV (NEEDLE) ×2 IMPLANT
NS IRRIG 1000ML POUR BTL (IV SOLUTION) ×2 IMPLANT
PACK TOTAL JOINT (CUSTOM PROCEDURE TRAY) ×2 IMPLANT
PAD ARMBOARD 7.5X6 YLW CONV (MISCELLANEOUS) ×4 IMPLANT
PASSER SUT SWANSON 36MM LOOP (INSTRUMENTS) ×2 IMPLANT
PILLOW ABDUCTION HIP (SOFTGOODS) ×2 IMPLANT
PRESSURIZER FEMORAL UNIV (MISCELLANEOUS) IMPLANT
SET HNDPC FAN SPRY TIP SCT (DISPOSABLE) IMPLANT
SPONGE GAUZE 4X4 12PLY (GAUZE/BANDAGES/DRESSINGS) IMPLANT
SPONGE LAP 4X18 X RAY DECT (DISPOSABLE) IMPLANT
STAPLER VISISTAT 35W (STAPLE) ×2 IMPLANT
SUCTION FRAZIER TIP 10 FR DISP (SUCTIONS) ×2 IMPLANT
SUT FIBERWIRE #2 38 REV NDL BL (SUTURE) ×6
SUT VIC AB 1 CTX 36 (SUTURE) ×2
SUT VIC AB 1 CTX36XBRD ANBCTR (SUTURE) ×2 IMPLANT
SUT VIC AB 2-0 SH 27 (SUTURE) ×2
SUT VIC AB 2-0 SH 27XBRD (SUTURE) ×2 IMPLANT
SUT VIC AB 3-0 SH 27 (SUTURE)
SUT VIC AB 3-0 SH 27X BRD (SUTURE) IMPLANT
SUTURE FIBERWR#2 38 REV NDL BL (SUTURE) ×3 IMPLANT
SYR 30ML SLIP (SYRINGE) ×2 IMPLANT
SYR CONTROL 10ML LL (SYRINGE) ×2 IMPLANT
TOWEL OR 17X24 6PK STRL BLUE (TOWEL DISPOSABLE) ×2 IMPLANT
TOWEL OR 17X26 10 PK STRL BLUE (TOWEL DISPOSABLE) ×2 IMPLANT
TOWER CARTRIDGE SMART MIX (DISPOSABLE) IMPLANT
TRAY FOLEY CATH 14FR (SET/KITS/TRAYS/PACK) ×2 IMPLANT
WATER STERILE IRR 1000ML POUR (IV SOLUTION) ×4 IMPLANT

## 2011-10-11 NOTE — Interval H&P Note (Signed)
History and Physical Interval Note:  10/11/2011 8:23 AM  Dorothy [name]  has presented today for surgery, with the diagnosis of AVN RIGHT HIP  The various methods of treatment have been discussed with the patient and family. After consideration of risks, benefits and other options for treatment, the patient has consented to  Procedure(s): TOTAL HIP ARTHROPLASTY as a surgical intervention .  The patients' history has been reviewed, patient examined, no change in status, stable for surgery.  I have reviewed the patients' chart and labs.  Questions were answered to the patient's satisfaction.     [name]

## 2011-10-11 NOTE — Transfer of Care (Signed)
Immediate Anesthesia Transfer of Care Note  Patient: Dorothy [name]  Procedure(s) Performed:  TOTAL HIP ARTHROPLASTY - 120 MINUTES FOR THIS SURGERY  Patient Location: PACU  Anesthesia Type: General  Level of Consciousness: awake, alert  and oriented  Airway & Oxygen Therapy: Patient Spontanous Breathing and Patient connected to nasal cannula oxygen  Post-op Assessment: Report given to PACU RN, Post -op Vital signs reviewed and stable and Patient moving all extremities  Post vital signs: Reviewed and stable  Complications: No apparent anesthesia complications

## 2011-10-11 NOTE — Brief Op Note (Signed)
10/11/2011  2:44 PM  PATIENT:  Dorothy [name]  55 y.o. female  PRE-OPERATIVE DIAGNOSIS:  avascular necrosis,  RIGHT HIP  POST-OPERATIVE DIAGNOSIS:  avascular necrosis,  RIGHT HIP  PROCEDURE:  Procedure(s): Right TOTAL HIP ARTHROPLASTY  SURGEON:  Surgeon(s): Loreta Ave, MD  PHYSICIAN ASSISTANT: Zonia Kief M    ANESTHESIA:   general  EBL:  Total I/O In: 1700 [I.V.:1700] Out: 550 [Urine:200; Blood:350]  BLOOD ADMINISTERED:none   SPECIMEN:  No Specimen  DISPOSITION OF SPECIMEN:  N/A  COUNTS:  YES   PATIENT DISPOSITION:  PACU - hemodynamically stable.

## 2011-10-11 NOTE — Preoperative (Signed)
Beta Blockers   Reason not to administer Beta Blockers:Not Applicable 

## 2011-10-11 NOTE — Anesthesia Postprocedure Evaluation (Signed)
  Anesthesia Post-op Note  Patient: Dorothy [name]  Procedure(s) Performed:  TOTAL HIP ARTHROPLASTY - 120 MINUTES FOR THIS SURGERY  Patient Location: PACU  Anesthesia Type: General  Level of Consciousness: awake  Airway and Oxygen Therapy: Patient Spontanous Breathing  Post-op Pain: mild  Post-op Assessment: Post-op Vital signs reviewed  Post-op Vital Signs: stable  Complications: No apparent anesthesia complications

## 2011-10-11 NOTE — Anesthesia Preprocedure Evaluation (Addendum)
Anesthesia Evaluation  Patient identified by MRN, date of birth, ID band Patient awake    Reviewed: Allergy & Precautions, H&P , NPO status , Patient's Chart, lab work & pertinent test results  History of Anesthesia Complications Negative for: history of anesthetic complications  Airway Mallampati: I TM Distance: >3 FB Neck ROM: Full    Dental  (+) Teeth Intact and Dental Advisory Given   Pulmonary pneumonia ,  clear to auscultation        Cardiovascular hypertension, + dysrhythmias Regular Normal    Neuro/Psych    GI/Hepatic negative GI ROS, Neg liver ROS,   Endo/Other  Hypothyroidism   Renal/GU negative Renal ROS     Musculoskeletal   Abdominal (+) obese,   Peds  Hematology   Anesthesia Other Findings   Reproductive/Obstetrics                          Anesthesia Physical Anesthesia Plan  ASA: II  Anesthesia Plan: General   Post-op Pain Management:    Induction: Intravenous  Airway Management Planned: Oral ETT  Additional Equipment:   Intra-op Plan:   Post-operative Plan: Extubation in OR  Informed Consent: I have reviewed the patients History and Physical, chart, labs and discussed the procedure including the risks, benefits and alternatives for the proposed anesthesia with the patient or authorized representative who has indicated his/her understanding and acceptance.   Dental advisory given  Plan Discussed with: CRNA, Surgeon and Anesthesiologist  Anesthesia Plan Comments:        Anesthesia Quick Evaluation

## 2011-10-11 NOTE — Progress Notes (Signed)
ANTICOAGULATION CONSULT NOTE - Initial Consult  Pharmacy Consult for Coumadin Indication: VTE prophylaxis s/p R THR  Allergies  Allergen Reactions  . Sulfa Antibiotics Rash    Patient Measurements: Ht 66 inches Wt 85 kg IBW 59 kg  Vital Signs: Temp: 96.7 F (35.9 C) (12/19 1810) Temp src: Oral (12/19 1810) BP: 111/68 mmHg (12/19 1810) Pulse Rate: 73  (12/19 1810)  Labs: No results found for this basename: HGB:2,HCT:3,PLT:3,APTT:3,LABPROT:3,INR:3,HEPARINUNFRC:3,CREATININE:3,CKTOTAL:3,CKMB:3,TROPONINI:3 in the last 72 hours The CrCl is unknown because both a height and weight (above a minimum accepted value) are required for this calculation.  Medical History: Past Medical History  Diagnosis Date  . Constipation   . Cancer     ovarian  . Hypertension     takes meds  . Hypothyroidism     on Levonthyroxine  . Pneumonia     x 2- last time > 5 years ago  . Depression     Takes Klonopin  . Arthritis     hands and Knees    Medications:  Prescriptions prior to admission  Medication Sig Dispense Refill  . clonazePAM (KLONOPIN) 0.5 MG tablet Take 0.75 mg by mouth daily.       Marland Kitchen estradiol (ESTRACE) 1 MG tablet Take 1 mg by mouth daily.        . hydrochlorothiazide (HYDRODIURIL) 25 MG tablet Take 25 mg by mouth daily.        Marland Kitchen levothyroxine (SYNTHROID, LEVOTHROID) 125 MCG tablet Take 125 mcg by mouth daily.        Marland Kitchen oxyCODONE-acetaminophen (PERCOCET) 5-325 MG per tablet Take 1 tablet by mouth every 6 (six) hours as needed.        . venlafaxine (EFFEXOR-XR) 150 MG 24 hr capsule Take 150 mg by mouth 2 (two) times daily.       . nabumetone (RELAFEN) 500 MG tablet Take 500 mg by mouth 2 (two) times daily.          Assessment: 55 y.o. Female to start coumadin for VTE prophylaxis s/p R THR. Baseline INR 1.03.  Goal of Therapy:  INR 2-3   Plan: 1. Coumadin 7.5mg  po now 2. Daily INR 3. Educational book and video  Dorothy [name] 10/11/2011,6:46 PM

## 2011-10-12 ENCOUNTER — Encounter (HOSPITAL_COMMUNITY): Payer: Self-pay | Admitting: Orthopedic Surgery

## 2011-10-12 LAB — BASIC METABOLIC PANEL
BUN: 11 mg/dL (ref 6–23)
CO2: 25 mEq/L (ref 19–32)
Chloride: 102 mEq/L (ref 96–112)
Creatinine, Ser: 0.69 mg/dL (ref 0.50–1.10)
Glucose, Bld: 106 mg/dL — ABNORMAL HIGH (ref 70–99)

## 2011-10-12 LAB — CBC
HCT: 33.8 % — ABNORMAL LOW (ref 36.0–46.0)
Hemoglobin: 11.3 g/dL — ABNORMAL LOW (ref 12.0–15.0)
MCV: 85.1 fL (ref 78.0–100.0)
RBC: 3.97 MIL/uL (ref 3.87–5.11)
WBC: 11.3 10*3/uL — ABNORMAL HIGH (ref 4.0–10.5)

## 2011-10-12 MED ORDER — WARFARIN SODIUM 7.5 MG PO TABS
7.5000 mg | ORAL_TABLET | Freq: Once | ORAL | Status: AC
  Administered 2011-10-12: 7.5 mg via ORAL
  Filled 2011-10-12: qty 1

## 2011-10-12 NOTE — Progress Notes (Signed)
Physical Therapy Evaluation Patient Details Name: Dorothy [name] MRN: [id number] DOB: 1956-03-15 Today's Date: 10/12/2011  Problem List: There is no problem list on file for this patient.   Past Medical History:  Past Medical History  Diagnosis Date  . Constipation   . Cancer     ovarian  . Hypertension     takes meds  . Hypothyroidism     on Levonthyroxine  . Pneumonia     x 2- last time > 5 years ago  . Depression     Takes Klonopin  . Arthritis     hands and Knees   Past Surgical History:  Past Surgical History  Procedure Date  . Total hip arthroplasty 09/06/2011    Procedure: TOTAL HIP ARTHROPLASTY;  Surgeon: Loreta Ave, MD;  Location: [facility] OR;  Service: Orthopedics;  Laterality: Left;  TOTAL HIP ARTHROPLASTY LEFT SIDE  . Abdominal hysterectomy 1984  . Ovarian cyst removal 1997  . Total hip arthroplasty 10/11/2011    Procedure: TOTAL HIP ARTHROPLASTY;  Surgeon: Loreta Ave, MD;  Location[facility] OR;  Service: Orthopedics;  Laterality: Right;  120 MINUTES FOR THIS SURGERY    PT Assessment/Plan/Recommendation PT Assessment Clinical Impression Statement: Pt presents with a Right THA and s/p L THA 4 weeks ago along with the following impairments/deficits and therapy diagnosis listed below. pt will benefit from skilled PT in the acute care setting in order to maximize functional mobility for a safe d/c home. PT Recommendation/Assessment: Patient will need skilled PT in the acute care venue PT Problem List: Decreased strength;Decreased range of motion;Decreased balance;Decreased activity tolerance;Decreased mobility;Decreased knowledge of use of DME;Decreased knowledge of precautions;Pain PT Therapy Diagnosis : Difficulty walking;Acute pain PT Plan PT Frequency: 7X/week PT Treatment/Interventions: DME instruction;Gait training;Stair training;Functional mobility training;Therapeutic activities;Therapeutic exercise;Balance training;Patient/family education PT  Recommendation Follow Up Recommendations: Home health PT;24 hour supervision/assistance Equipment Recommended: None recommended by PT PT Goals  Acute Rehab PT Goals PT Goal Formulation: With patient Time For Goal Achievement: 7 days Pt will go Supine/Side to Sit: with modified independence PT Goal: Supine/Side to Sit - Progress: Progressing toward goal Pt will go Sit to Supine/Side: with modified independence PT Goal: Sit to Supine/Side - Progress: Progressing toward goal Pt will go Sit to Stand: with modified independence PT Goal: Sit to Stand - Progress: Progressing toward goal Pt will go Stand to Sit: with modified independence PT Goal: Stand to Sit - Progress: Progressing toward goal Pt will Transfer Bed to Chair/Chair to Bed: with modified independence PT Transfer Goal: Bed to Chair/Chair to Bed - Progress: Progressing toward goal Pt will Ambulate: >150 feet;with supervision;with rolling walker PT Goal: Ambulate - Progress: Progressing toward goal Pt will Perform Home Exercise Program: Independently PT Goal: Perform Home Exercise Program - Progress: Progressing toward goal  PT Evaluation Precautions/Restrictions  Restrictions Weight Bearing Restrictions: Yes RLE Weight Bearing: Touchdown weight bearing Prior Functioning  Home Living Lives With: Alone Receives Help From: Family Type of Home: House Home Layout: One level Home Access: Ramped entrance Bathroom Shower/Tub: Tub/shower unit;Curtain Bathroom Toilet: Handicapped height Bathroom Accessibility: Yes How Accessible: Accessible via walker Home Adaptive Equipment: Tub transfer bench;Walker - rolling;Bedside commode/3-in-1;Reacher;Straight cane Prior Function Level of Independence: Independent with basic ADLs;Independent with gait;Independent with transfers;Independent with homemaking with ambulation (prior to first surgery) Able to Take Stairs?: Yes Driving: Yes Vocation: Full time  employment Cognition Cognition Arousal/Alertness: Awake/alert Overall Cognitive Status: Appears within functional limits for tasks assessed Orientation Level: Oriented X4 Sensation/Coordination Sensation Light Touch: Impaired Detail  Light Touch Impaired Details: Impaired RLE Extremity Assessment RLE Assessment RLE Assessment: Exceptions to [facility] RLE AROM (degrees) Overall AROM Right Lower Extremity: Deficits;Due to pain;Due to precautions (Knee and Ankle WFl) RLE Strength RLE Overall Strength: Deficits;Due to precautions;Due to pain (Knee and Ankle WFl) LLE Assessment LLE Assessment: Exceptions to WFL LLE AROM (degrees) Overall AROM Left Lower Extremity: Deficits;Due to precautions;Due to pain (Knee and Ankle WFL) LLE Strength LLE Overall Strength: Within Functional Limits for tasks assessed Mobility (including Balance) Bed Mobility Bed Mobility: Yes Supine to Sit: 3: Mod assist;HOB elevated (Comment degrees);With rails (40) Supine to Sit Details (indicate cue type and reason): VC for sequencing and hand placement. Pt was able to maintain bilateral hip precautions with minimal cueing Sitting - Scoot to Edge of Bed: 4: Min assist Sitting - Scoot to [place] of Bed Details (indicate cue type and reason): VC for hand placement Transfers Transfers: Yes Sit to Stand: 3: Mod assist;From bed;With upper extremity assist Sit to Stand Details (indicate cue type and reason): VC for hand placement and safety to RW. Pt used both UEs on RW secondary to difficulty. Discussed safety with UE support on bed for further transfers. Stand to Sit: 4: Min assist;With upper extremity assist;To chair/3-in-1 Stand to Sit Details: VC for hand placement. Pt with minimal cueing was able to sit maintaining precautions Ambulation/Gait Ambulation/Gait: Yes Ambulation/Gait Assistance: 4: Min assist Ambulation/Gait Assistance Details (indicate cue type and reason): VC for sequencing throughout. Ambulation Distance  (Feet): 20 Feet Assistive device: Rolling walker Gait Pattern: Decreased step length - right;Decreased stance time - left;Decreased hip/knee flexion - right;Decreased weight shift to right;Trunk flexed Gait velocity: Decreased gait speed Stairs: No    Exercise  Total Joint Exercises Ankle Circles/Pumps: AROM;Strengthening;Both;10 reps;Supine Quad Sets: AROM;Right;Strengthening;10 reps;Supine End of Session PT - End of Session Equipment Utilized During Treatment: Gait belt Activity Tolerance: Patient tolerated treatment well Patient left: in chair;with call bell in reach Nurse Communication: Mobility status for transfers;Mobility status for ambulation General Behavior During Session: [facility] for tasks performed Cognition: [facility] for tasks performed  Milana Kidney 10/12/2011, 1:07 PM  10/12/2011 Milana Kidney DPT PAGER: [phone] OFFICE: [phone]

## 2011-10-12 NOTE — Progress Notes (Signed)
Subjective: Doing well.  No complaints.    Objective: Vital signs in last 24 hours: Temp:  [96.7 F (35.9 C)-100 F (37.8 C)] 100 F (37.8 C) (12/20 0656) Pulse Rate:  [62-101] 62  (12/20 0656) Resp:  [7-40] 18  (12/20 0809) BP: (111-166)/(55-81) 127/81 mmHg (12/20 0656) SpO2:  [85 %-99 %] 93 % (12/20 0809)  Intake/Output from previous day: 12/19 0701 - 12/20 0700 In: 1799 [I.V.:1799] Out: 1100 [Urine:750; Blood:350] Intake/Output this shift:     [facility] 10/12/11 0640  HGB 11.3*    Basename 10/12/11 0640  WBC 11.3*  RBC 3.97  HCT 33.8*  PLT 205    Basename 10/12/11 0640  NA 137  K 4.0  CL 102  CO2 25  BUN 11  CREATININE 0.69  GLUCOSE 106*  CALCIUM 8.6    Basename 10/12/11 0640  LABPT --  INR 1.03    Neurovascular intact Dressing c/d/i.   Assessment/Plan: Anticipate d/c home Friday.  D/c pca.  S/p left THR a few weeks ago.  Will see if she can tolerate tdwb right leg.  May need to change restrictions.  Discussed with therapist.   Naida Sleight 10/12/2011, 9:30 AM

## 2011-10-12 NOTE — Progress Notes (Signed)
ANTICOAGULATION CONSULT NOTE - Follow Up Consult  Pharmacy Consult for: Coumadin Indication: VTE px s/p R THR  Allergies  Allergen Reactions  . Sulfa Antibiotics Rash   Vital Signs: Temp: 100 F (37.8 C) (12/20 0656) Temp src: Oral (12/20 0656) BP: 127/81 mmHg (12/20 0656) Pulse Rate: 62  (12/20 0656)  Labs:  Basename 10/12/11 0640  HGB 11.3*  HCT 33.8*  PLT 205  APTT --  LABPROT 13.7  INR 1.03  HEPARINUNFRC --  CREATININE 0.69  CKTOTAL --  CKMB --  TROPONINI --   Assessment: 55yof on coumadin for VTE px s/p R THR. INR of 1.03 remains below goal after first dose coumadin as expected.   Goal of Therapy:  INR 2-3   Plan:  1) Repeat coumadin 7.5mg  x 1 2) Follow up INR in AM 3) Continue lovenox until INR>/=1.8  Fredrik Rigger 10/12/2011,11:43 AM

## 2011-10-13 LAB — BASIC METABOLIC PANEL
BUN: 6 mg/dL (ref 6–23)
CO2: 27 mEq/L (ref 19–32)
Calcium: 8.8 mg/dL (ref 8.4–10.5)
Glucose, Bld: 109 mg/dL — ABNORMAL HIGH (ref 70–99)
Sodium: 137 mEq/L (ref 135–145)

## 2011-10-13 LAB — PROTIME-INR: Prothrombin Time: 20.5 seconds — ABNORMAL HIGH (ref 11.6–15.2)

## 2011-10-13 LAB — CBC
HCT: 29.7 % — ABNORMAL LOW (ref 36.0–46.0)
Hemoglobin: 10.2 g/dL — ABNORMAL LOW (ref 12.0–15.0)
MCH: 28.7 pg (ref 26.0–34.0)
RBC: 3.56 MIL/uL — ABNORMAL LOW (ref 3.87–5.11)

## 2011-10-13 MED ORDER — WARFARIN SODIUM 5 MG PO TABS
5.0000 mg | ORAL_TABLET | Freq: Once | ORAL | Status: DC
Start: 2011-10-13 — End: 2011-10-13
  Filled 2011-10-13: qty 1

## 2011-10-13 NOTE — Op Note (Signed)
NAMEMarland Kitchen  YANELY, MAST NO.:  [id number]  MEDICAL RECORD NO.:  [id number]  LOCATION:  5015                         FACILITY:  [facility]  PHYSICIAN:  Loreta Ave, M.D. DATE OF BIRTH:  1956-06-27  DATE OF PROCEDURE:  10/11/2011 DATE OF DISCHARGE:                              OPERATIVE REPORT   PREOPERATIVE DIAGNOSIS:  Right hip avascular necrosis with arthritis.  POSTOPERATIVE DIAGNOSIS:  Right hip avascular necrosis with arthritis.  PROCEDURE:  Right total hip replacement utilizing Stryker Secur-Fit prosthesis.  Press-fit 54 mm acetabular component, screw fixation x2.  A 40-mm internal diameter polyethylene liner.  A Press-fit #9 femoral stem.  A 127-degree neck angle.  A 40-mm, -2.5 Biolox ceramic head.  SURGEON:  Loreta Ave, M.D.  ASSISTANT:  Genene Churn. Barry Dienes, [place], present throughout the entire case and necessary for timely completion of procedure.  ANESTHESIA:  General.  BLOOD LOSS:  250 mL.  BLOOD GIVEN:  None.  SPECIMENS:  None.  COMPLICATION:  None.  DRESSINGS:  Soft compressive abduction pillow.  PROCEDURE IN DETAIL:  The patient was brought to the operating room, placed on the operating table in supine position.  After adequate anesthesia had been obtained, lateral position, right side up. Appropriate padding and support.  Prepped and draped in usual sterile fashion.  Incision along the shaft of the femur to the trochanter extending posterosuperior.  Skin and subcutaneous tissue divided. Hemostasis with cautery.  Iliotibial band exposed and incised.  Charnley retractor put in place.  Neurovascular structures were identified and protected.  External rotator and capsule taken down off the back of the intertrochanteric groove and tied with FiberWire.  Hip dislocated posteriorly.  Femoral neck cut 1 fingerbreadth above the lesser trochanter.  Changes of avascular necrosis of the head.  Acetabulum exposed.  Changes there as well.  At least  moderate.  Brought up to good bleeding bone, sufficient medial and inferior placement.  Redundant labrum debrided.  Sized for 54 mm cup.  Hammered in place at 45 degrees of abduction, 20 degrees of anteversion.  Good capturing and fixation confirmed.  Augmented with 2 screws drilled through the cup and tightened down.  A 40-mm internal diameter polyethylene liner inserted. Attention was turned to the femur.  Sequential reaming with broaches and reamers up to good sizing proximally and distally.  After appropriate trials, #9 component firmly seated.  The 40-mm, -2.5 Biolox head attached.  Hip reduced.  Equal leg lengths.  Excellent stability in flexion and extension.  Wound copiously irrigated.  External rotator and capsule repaired to the back of the intertrochanteric groove, FiberWire tied over bony bridge.  Charnley retractor removed.  Iliotibial band was closed with 1 Vicryl.  Skin and subcutaneous tissue with Vicryl and staples.  Margins were injected with Marcaine.  A sterile compressive dressing was applied.  Returned to supine position.  Abduction pillow placed.  Anesthesia reversed.  Brought to the recovery room.  Tolerated surgery well.  No complications.     Loreta Ave, M.D.     DFM/MEDQ  D:  10/12/2011  T:  10/13/2011  Job:  [id number]

## 2011-10-13 NOTE — Progress Notes (Signed)
   OT order received. Chart reviewed. Discussed role of OT with pt. Pt had previous Hip surgery @ 1 month ago and has all nec DME and AE and is knowledgeable on hip precautions. No further OT needed.

## 2011-10-13 NOTE — Progress Notes (Signed)
Subjective: Doing well.  Pain controlled.  Hoping to d/c home today.   Objective: Vital signs in last 24 hours: Temp:  [98.6 F (37 C)-99.7 F (37.6 C)] 98.6 F (37 C) (12/21 0547) Pulse Rate:  [86-87] 87  (12/21 0547) Resp:  [18-20] 18  (12/21 0547) BP: (110-116)/(64-67) 110/64 mmHg (12/21 0547) SpO2:  [90 %-92 %] 90 % (12/21 0547)  Intake/Output from previous day: 12/20 0701 - 12/21 0700 In: 2745.5 [P.O.:1040; I.V.:1705.5] Out: 300 [Urine:300] Intake/Output this shift:     Basename 10/13/11 0545 10/12/11 0640  HGB 10.2* 11.3*    Basename 10/13/11 0545 10/12/11 0640  WBC 13.1* 11.3*  RBC 3.56* 3.97  HCT 29.7* 33.8*  PLT 167 205    Basename 10/13/11 0545 10/12/11 0640  NA 137 137  K 3.3* 4.0  CL 103 102  CO2 27 25  BUN 6 11  CREATININE 0.58 0.69  GLUCOSE 109* 106*  CALCIUM 8.8 8.6    Basename 10/13/11 0545 10/12/11 0640  LABPT -- --  INR 1.72* 1.03    Neurovascular intact No cellulitis present Wound looks good.  Staples intact.  No drainage.    Assessment/Plan: Did great with therapy.  D/c home today.     Hadley Soileau M 10/13/2011, 1:02 PM

## 2011-10-13 NOTE — Progress Notes (Signed)
Physical Therapy Treatment Patient Details Name: Dorothy [name] MRN: [id number] DOB: 09-02-1956 Today's Date: 10/13/2011  PT Assessment/Plan  PT - Assessment/Plan Comments on Treatment Session: Pt is at a modified independence-supervision level for all mobility. Pt is safe to d/c home with supervision for safety PT Plan: Discharge plan remains appropriate PT Frequency: 7X/week Follow Up Recommendations: Home health PT;24 hour supervision/assistance Equipment Recommended: None recommended by PT PT Goals  Acute Rehab PT Goals PT Goal Formulation: With patient PT Goal: Supine/Side to Sit - Progress: Progressing toward goal PT Goal: Sit to Supine/Side - Progress: Progressing toward goal PT Goal: Sit to Stand - Progress: Progressing toward goal PT Goal: Stand to Sit - Progress: Progressing toward goal PT Transfer Goal: Bed to Chair/Chair to Bed - Progress: Progressing toward goal PT Goal: Ambulate - Progress: Met PT Goal: Perform Home Exercise Program - Progress: Progressing toward goal  PT Treatment Precautions/Restrictions  Restrictions Weight Bearing Restrictions: Yes RLE Weight Bearing: Touchdown weight bearing Mobility (including Balance) Bed Mobility Bed Mobility: No Transfers Transfers: Yes Sit to Stand: From chair/3-in-1;With upper extremity assist;5: Supervision Sit to Stand Details (indicate cue type and reason): VC for hand placement. Pt able to maintain bilateral hip precautions while standing Stand to Sit: 5: Supervision;With upper extremity assist;To chair/3-in-1 Stand to Sit Details: VC for hand placement and safety of LEs Ambulation/Gait Ambulation/Gait: Yes Ambulation/Gait Assistance: 5: Supervision Ambulation/Gait Assistance Details (indicate cue type and reason): VC for sequencing; increased heel strike as well as increased knee flexion to decreased stiffness throughout ambulation Ambulation Distance (Feet): 400 Feet Assistive device: Rolling walker Gait  Pattern: Decreased step length - right;Decreased stance time - left;Decreased hip/knee flexion - right;Decreased weight shift to right;Trunk flexed Gait velocity: Decreased gait speed Stairs: No    Exercise  Total Joint Exercises Heel Slides: AROM;Strengthening;Right;10 reps;Seated Hip ABduction/ADduction: AROM;10 reps;Strengthening;Right;Supine Straight Leg Raises: AROM;Right;10 reps;Supine Long Arc Quad: AROM;Right;Strengthening;10 reps;Seated Marching in Standing: Seated;AROM;Strengthening;Right;10 reps End of Session PT - End of Session Equipment Utilized During Treatment: Gait belt Activity Tolerance: Patient tolerated treatment well Patient left: in chair;with call bell in reach Nurse Communication: Mobility status for transfers;Mobility status for ambulation General Behavior During Session: [facility] for tasks performed Cognition: [facility] for tasks performed  Milana Kidney 10/13/2011, 2:37 PM  10/13/2011 Milana Kidney DPT PAGER: [phone] OFFICE: [phone]

## 2011-10-13 NOTE — Progress Notes (Signed)
Utilization review completed. Rogerio Boutelle, RN, BSN. 10/13/11  

## 2011-10-13 NOTE — Progress Notes (Signed)
ANTICOAGULATION CONSULT NOTE - Follow Up Consult  Pharmacy Consult for: Coumadin Indication: VTE px s/p R THR  Allergies  Allergen Reactions  . Sulfa Antibiotics Rash   Vital Signs: Temp: 98.6 F (37 C) (12/21 0547) Temp src: Oral (12/21 0547) BP: 110/64 mmHg (12/21 0547) Pulse Rate: 87  (12/21 0547)  Labs:  Basename 10/13/11 0545 10/12/11 0640  HGB 10.2* 11.3*  HCT 29.7* 33.8*  PLT 167 205  APTT -- --  LABPROT 20.5* 13.7  INR 1.72* 1.03  HEPARINUNFRC -- --  CREATININE 0.58 0.69  CKTOTAL -- --  CKMB -- --  TROPONINI -- --   Assessment: 55yof continues on coumadin for VTE px s/p R THR. INR with large jump from 1.03-->1.72 after 2 doses of 7.5mg . Will decrease dose today. No bleeding noted.  Goal of Therapy:  INR 2-3   Plan:  1) Coumadin 5mg  x 1 2) Follow up INR in AM 3) Continue lovenox until INR >/= 1.8  Fredrik Rigger 10/13/2011,10:03 AM

## 2011-11-05 NOTE — Discharge Summary (Signed)
   ABBREVIATED DISCHARGE SUMMARY  DATE OF HOSPITALIZATION:  11 Oct 2011    REASON FOR HOSPITALIZATION:  55 YO WF W/ RIGHT HIP AVN/DJD AND CHRONIC PAIN.      SIGNIFICANT FINDINGS:  DJD/AVN  OPERATION:  RIGHT THR  FINAL DIAGNOSIS:  SAME    SECONDARY DIAGNOSIS:  NONE   CONSULTANTS:  NONE  DISCHARGE CONDITION:  STABLE  DISCHARGED TO:  HOME

## 2011-11-07 ENCOUNTER — Other Ambulatory Visit: Payer: Self-pay | Admitting: Obstetrics and Gynecology

## 2011-11-07 DIAGNOSIS — Z1231 Encounter for screening mammogram for malignant neoplasm of breast: Secondary | ICD-10-CM

## 2011-11-21 ENCOUNTER — Ambulatory Visit
Admission: RE | Admit: 2011-11-21 | Discharge: 2011-11-21 | Disposition: A | Discharge status: Home / Self Care | Payer: PRIVATE HEALTH INSURANCE | Source: Ambulatory Visit | Attending: Obstetrics and Gynecology | Admitting: Obstetrics and Gynecology

## 2011-11-21 DIAGNOSIS — Z1231 Encounter for screening mammogram for malignant neoplasm of breast: Secondary | ICD-10-CM

## 2012-01-16 ENCOUNTER — Telehealth: Payer: Self-pay | Admitting: Oncology

## 2012-01-16 NOTE — Telephone Encounter (Signed)
S/w pt re appt for 3/27 @ 9:30 am.

## 2012-01-17 ENCOUNTER — Encounter: Payer: Self-pay | Admitting: Oncology

## 2012-01-17 ENCOUNTER — Telehealth: Payer: Self-pay | Admitting: Oncology

## 2012-01-17 ENCOUNTER — Ambulatory Visit: Payer: PRIVATE HEALTH INSURANCE

## 2012-01-17 ENCOUNTER — Ambulatory Visit (HOSPITAL_BASED_OUTPATIENT_CLINIC_OR_DEPARTMENT_OTHER): Payer: PRIVATE HEALTH INSURANCE | Admitting: Oncology

## 2012-01-17 ENCOUNTER — Other Ambulatory Visit: Payer: PRIVATE HEALTH INSURANCE

## 2012-01-17 VITALS — BP 159/84 | HR 67 | Temp 97.0°F | Ht 68.0 in | Wt 183.2 lb

## 2012-01-17 DIAGNOSIS — G5690 Unspecified mononeuropathy of unspecified upper limb: Secondary | ICD-10-CM

## 2012-01-17 DIAGNOSIS — M87052 Idiopathic aseptic necrosis of left femur: Secondary | ICD-10-CM

## 2012-01-17 DIAGNOSIS — M87051 Idiopathic aseptic necrosis of right femur: Secondary | ICD-10-CM

## 2012-01-17 DIAGNOSIS — I10 Essential (primary) hypertension: Secondary | ICD-10-CM

## 2012-01-17 DIAGNOSIS — N83209 Unspecified ovarian cyst, unspecified side: Secondary | ICD-10-CM

## 2012-01-17 DIAGNOSIS — E039 Hypothyroidism, unspecified: Secondary | ICD-10-CM

## 2012-01-17 DIAGNOSIS — F418 Other specified anxiety disorders: Secondary | ICD-10-CM

## 2012-01-17 DIAGNOSIS — C539 Malignant neoplasm of cervix uteri, unspecified: Secondary | ICD-10-CM

## 2012-01-17 DIAGNOSIS — J189 Pneumonia, unspecified organism: Secondary | ICD-10-CM | POA: Insufficient documentation

## 2012-01-17 DIAGNOSIS — R21 Rash and other nonspecific skin eruption: Secondary | ICD-10-CM

## 2012-01-17 DIAGNOSIS — F17200 Nicotine dependence, unspecified, uncomplicated: Secondary | ICD-10-CM

## 2012-01-17 DIAGNOSIS — M87059 Idiopathic aseptic necrosis of unspecified femur: Secondary | ICD-10-CM

## 2012-01-17 HISTORY — DX: Pneumonia, unspecified organism: J18.9

## 2012-01-17 HISTORY — DX: Other specified anxiety disorders: F41.8

## 2012-01-17 HISTORY — DX: Unspecified ovarian cyst, unspecified side: N83.209

## 2012-01-17 HISTORY — DX: Rash and other nonspecific skin eruption: R21

## 2012-01-17 HISTORY — DX: Hypothyroidism, unspecified: E03.9

## 2012-01-17 HISTORY — DX: Essential (primary) hypertension: I10

## 2012-01-17 HISTORY — DX: Idiopathic aseptic necrosis of left femur: M87.051

## 2012-01-17 LAB — CBC & DIFF AND RETIC
Basophils Absolute: 0 10*3/uL (ref 0.0–0.1)
Eosinophils Absolute: 0.1 10*3/uL (ref 0.0–0.5)
HCT: 42.7 % (ref 34.8–46.6)
HGB: 15.4 g/dL (ref 11.6–15.9)
LYMPH%: 32.1 % (ref 14.0–49.7)
MONO#: 0.6 10*3/uL (ref 0.1–0.9)
NEUT#: 4.5 10*3/uL (ref 1.5–6.5)
NEUT%: 59 % (ref 38.4–76.8)
Platelets: 223 10*3/uL (ref 145–400)
RBC: 5.58 10*6/uL — ABNORMAL HIGH (ref 3.70–5.45)
Retic %: 1.36 % (ref 0.70–2.10)
WBC: 7.5 10*3/uL (ref 3.9–10.3)

## 2012-01-17 LAB — MORPHOLOGY
PLT EST: ADEQUATE
RBC Comments: NORMAL

## 2012-01-17 LAB — CHCC SMEAR

## 2012-01-17 NOTE — Telephone Encounter (Signed)
Referred by Dr. Mckinley Jewel Dx- Avascular Necrosis

## 2012-01-17 NOTE — Telephone Encounter (Signed)
Per cl one of the labs today needed to be fasting,pt will come back tomorrow for the lab and her 4/12 appt was printed for her

## 2012-01-17 NOTE — Progress Notes (Signed)
New Patient Hematology-Oncology Evaluation   Dorothy [name] [id number] [birth year] 56 y.o. 01/17/2012  CC:Dr. Mckinley Jewel; Dr. Ninfa Linden [facility]   Reason for referral: Unexplained avascular necrosis of both hips and now both knees   HPI: New patient evaluation for this pleasant 56 year old Caucasian woman who began to develop insidious onset of left hip pain about 2 years ago. Further evaluation revealed changes compatible with avascular necrosis of both hips. She underwent a left total hip replacement on 09/06/2011 and then a right total hip replacement on 10/11/2011. While recovering from the hip surgery she began to develop significant and progressive bilateral knee pain. MRI scans were done and now showed areas of avascular necrosis posterior aspect distal femurs bilaterally. An additional area involving the distal diaphysis of the left femur. She has never been on any chronic steroid therapy for any reason. She took to steroid dose Pax during separate episodes of pneumonia about 5 and 10 years ago. She is on no other vasoactive medication. She is a one pack per day cigarette smoker. She has no major signs or symptoms of a collagen vascular disease. She does have arthritis of her hands but no other joints other than the hips and knees. She was never told that she had inflammatory arthritis, rheumatoid arthritis, or lupus. She does get a intermittent non-pruritic skin rash on the dorsum of her hands and extensive surfaces of her arms. She has a sister who gets a similar rash. She has noted increased hair loss over the last month or so. She denies any facial rash or photosensitivity. She denies any paresthesias. She is hypothyroid on replacement since age 11. There is no family history of inflammatory arthritis, collagen vascular disorder. Her mother left home when she was just a child and died in 2005/03/24. She had some kind of a blood disorder specific type unknown to the patient. She also  had lung cancer but this was not the cause of her death. There is a family history of cancer but no family history of any vascular disease.   PMH: Past Medical History  Diagnosis Date  . Constipation   . Cancer     ovarian  . Hypertension     takes meds  . Hypothyroidism     on Levonthyroxine  . Pneumonia     x 2- last time > 5 years ago  . Depression     Takes Klonopin  . Arthritis     hands and Knees  . Avascular necrosis of bones of both hips 01/17/2012  . Hypothyroidism (acquired) 01/17/2012    Since age 48  . Benign essential HTN 01/17/2012  . Depression with anxiety 01/17/2012  . Cancer of cervix 01/17/2012  . Ovarian cyst 01/17/2012    1995  BSO  . Pneumonia 01/17/2012    10 yrs ago and 5 years ago   . Rash and other nonspecific skin eruption 01/17/2012    Dorsum hands & extensor surfaces arms; intermittent    Past Surgical History  Procedure Date  . Total hip arthroplasty 09/06/2011    Procedure: TOTAL HIP ARTHROPLASTY;  Surgeon: Loreta Ave, MD;  Location[facility];  Service: Orthopedics;  Laterality: Left;  TOTAL HIP ARTHROPLASTY LEFT SIDE  . Abdominal hysterectomy 1984  . Ovarian cyst removal 1997  . Total hip arthroplasty 10/11/2011    Procedure: TOTAL HIP ARTHROPLASTY;  Surgeon: Loreta Ave, MD;  Location[facility] OR;  Service: Orthopedics;  Laterality: Right;  120 MINUTES FOR THIS SURGERY  Allergies: Allergies  Allergen Reactions  . Sulfa Antibiotics Rash    Medications: Medications Prior to Admission  Medication Sig Dispense Refill  . clonazePAM (KLONOPIN) 0.5 MG tablet Take 0.75 mg by mouth daily.       Marland Kitchen estradiol (ESTRACE) 1 MG tablet Take 1 mg by mouth daily.        . hydrochlorothiazide (HYDRODIURIL) 25 MG tablet Take 25 mg by mouth daily.        Marland Kitchen levothyroxine (SYNTHROID, LEVOTHROID) 125 MCG tablet Take 125 mcg by mouth daily.        Marland Kitchen venlafaxine (EFFEXOR-XR) 150 MG 24 hr capsule Take 150 mg by mouth 2 (two) times daily.        No current  facility-administered medications on file as of 01/17/2012.    Social History: She is married. She has one son age 9 who is healthy. 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  reports that she has been smoking Cigarettes.  She has a 17.5 pack-year smoking history. She does not have any smokeless tobacco history on file. She reports that she does not drink alcohol or use illicit drugs.  Family History: Family History  Problem Relation Age of Onset  . Anesthesia problems Neg Hx   . Hypotension Neg Hx   . Malignant hyperthermia Neg Hx   . Pseudochol deficiency Neg Hx    mother with a blood disease type unknown to the patient. Father died competitions of congestive heart failure. A paternal grandmother had ovarian cancer a paternal aunt had lung cancer. She has 3 brothers and 2 sisters. One of her sisters age 60 and is currently dying of metastatic breast cancer. One sister age 106 has arthritis  Review of Systems: Constitutional symptoms: Weight has been down about 8-10 pounds which she attributes to the recent surgeries. HEENT: No sore throat. No dysphagia. Respiratory: No cough or dyspnea. Cardiovascular: No chest pain, pressure, or palpitations.  Gastrointestinal ROS: No change in bowel habit, she tends to be constipated, no hematochezia or melena Genito-Urinary ROS: No urinary tract symptoms, no vaginal bleeding, no hematuria Hematological and Lymphatic: No excessive fatigue, Musculoskeletal: See above Neurologic: No headache or change in vision Dermatologic: See above Remaining ROS negative.  Physical Exam: Blood pressure 159/84, pulse 67, temperature 97 F (36.1 C), temperature source Oral, height 5\' 8"  (1.727 m), weight 183 lb 3.2 oz (83.099 kg). Wt Readings from Last 3 Encounters:  01/17/12 183 lb 3.2 oz (83.099 kg)  10/05/11 186 lb 15.2 oz (84.8 kg)  09/06/11 192 lb 0.3 oz (87.1 kg)    General appearance: Well-nourished Caucasian  woman Head: Normocephalic; she is wearing dentures. Oropharynx no erythema exudate ulcer or mass Neck: Full range of motion; no thyromegaly or thyroid mass Lymph nodes: No cervical supraclavicular or axillary adenopathy Breasts: Not examined Lungs: Clear to auscultation resonant to percussion Heart: Regular rhythm no murmur or gallop Abdominal: Soft nontender no mass no organomegaly GU: Not examined Extremities: No edema no calf tenderness Vascular: Carotids 2+ no bruits, radial and ulnar pulses 1+ symmetric, dorsalis pedis pulses nonpalpable but symmetric, posterior tibial pulses 1+ and symmetric. Neurologic: Mental status intact, cranial nerves intact, pupils equal round reactive to light, optic disc sharp, vessels normal, no hemorrhage or exudate. Motor strength is 5 over 5. Reflexes 2+ symmetric. Coordination is normal. There is moderate decreased to vibration sensation over the fingertips by tuning fork exam Skin: Currently no rash or ecchymosis; no palmar erythema;  she is wearing fingernail and toenail polish    Lab Results: Lab Results  Component Value Date   WBC 13.1* 10/13/2011   HGB 10.2* 10/13/2011   HCT 29.7* 10/13/2011   MCV 83.4 10/13/2011   PLT 167 10/13/2011     Chemistry      Component Value Date/Time   NA 137 10/13/2011 0545   K 3.3* 10/13/2011 0545   CL 103 10/13/2011 0545   CO2 27 10/13/2011 0545   BUN 6 10/13/2011 0545   CREATININE 0.58 10/13/2011 0545      Component Value Date/Time   CALCIUM 8.8 10/13/2011 0545   ALKPHOS 89 10/05/2011 1231   AST 11 10/05/2011 1231   ALT 11 10/05/2011 1231   BILITOT 0.4 10/05/2011 1231    Laboratory studies obtained prior to her surgeries back in October of 2012 showed a hemoglobin of 16 hematocrit 43 MCV 83 white count 5200 no differential platelets 246,000. Normal liver functions including total protein 7.0 albumin 4.3 bilirubin 0.3, urinalysis at that time negative for blood bilirubin or protein Lab studies done in  our office today: [place]   Review of peripheral blood film: Normochromic normocytic red cells. Mature neutrophils. Subpopulation of benign reactive lymphocytes. Normal platelets.   Radiological Studies: See discussion above     Impression and Plan: Idiopathic avascular necrosis bilateral hips and knees in an otherwise healthy Caucasian woman. No signs or symptoms of a collagen vascular disorder. However I am going to screen her for lupus, and rheumatoid arthritis with an ANA, RF, ESR. She has had an intermittent atypical rash on dorsum  hands and arms and decreased vibration sensation which may be insignificant but in the absence of any other good reason for her avascular necrosis, I am going to screen her for porphyria. She denies any paresthesias but has significant decreased to vibration sensation by tuning fork exam. I am going to check a serum protein electrophoresis and immunofixation electrophoresis. I'll see her back in 2 weeks when the outstanding lab tests are available.        Levert Feinstein, MD 01/17/2012, 11:19 AM

## 2012-01-18 ENCOUNTER — Other Ambulatory Visit (HOSPITAL_BASED_OUTPATIENT_CLINIC_OR_DEPARTMENT_OTHER): Payer: PRIVATE HEALTH INSURANCE | Admitting: Lab

## 2012-01-18 DIAGNOSIS — M87059 Idiopathic aseptic necrosis of unspecified femur: Secondary | ICD-10-CM

## 2012-01-19 LAB — IMMUNOFIXATION ELECTROPHORESIS
IgM, Serum: 69 mg/dL (ref 52–322)
Total Protein, Serum Electrophoresis: 6.9 g/dL (ref 6.0–8.3)

## 2012-01-19 LAB — RHEUMATOID FACTOR: Rhuematoid fact SerPl-aCnc: <10 IU/mL (ref <=14)

## 2012-01-19 LAB — SEDIMENTATION RATE: Sed Rate: 1 mm/hr (ref 0–22)

## 2012-01-23 ENCOUNTER — Telehealth: Payer: Self-pay | Admitting: Oncology

## 2012-01-23 ENCOUNTER — Other Ambulatory Visit: Payer: Self-pay

## 2012-01-23 ENCOUNTER — Encounter: Payer: Self-pay | Admitting: Oncology

## 2012-01-23 NOTE — Telephone Encounter (Signed)
Per 4/2 pof moved 4/12 f/u to 4:30 pm. lmonvm for pt re change w/new d/t for 4/12 @ 4 pm. Schedule mailed.

## 2012-01-29 LAB — PORPHYRINS, FRACTIONATION-PLASMA: Total porphyrins: 1.6 mcg/L (ref 1.0–5.6)

## 2012-02-02 ENCOUNTER — Other Ambulatory Visit: Payer: PRIVATE HEALTH INSURANCE

## 2012-02-02 ENCOUNTER — Ambulatory Visit (HOSPITAL_BASED_OUTPATIENT_CLINIC_OR_DEPARTMENT_OTHER): Payer: PRIVATE HEALTH INSURANCE | Admitting: Oncology

## 2012-02-02 ENCOUNTER — Ambulatory Visit: Payer: PRIVATE HEALTH INSURANCE | Admitting: Oncology

## 2012-02-02 ENCOUNTER — Other Ambulatory Visit: Payer: PRIVATE HEALTH INSURANCE | Admitting: Lab

## 2012-02-02 VITALS — BP 132/84 | HR 68 | Temp 97.3°F | Ht 68.0 in | Wt 186.3 lb

## 2012-02-02 DIAGNOSIS — M8708 Idiopathic aseptic necrosis of bone, other site: Secondary | ICD-10-CM

## 2012-02-02 DIAGNOSIS — M8700 Idiopathic aseptic necrosis of unspecified bone: Secondary | ICD-10-CM

## 2012-02-02 NOTE — Progress Notes (Signed)
Hematology and Oncology Follow Up Visit  Dorothy [name] [id number] February 07, 1956 55 y.o. 02/02/2012 8:06 PM   Principle Diagnosis: Encounter Diagnosis  Name Primary?  . Avascular necrosis of bone Yes     Interim History:  Short interim followup visit for this 56 year old woman referred to see if I have any bright ideas about why she has developed idiopathic avascular necrosis of both hips which now appears to be extending to her knees. Lab data obtained here at time of visit on March 27 show a normal CBC with differential, negative ANA, negative rheumatoid factor, ESR 1 mm, normal serum immunoglobulins and absence of any monoclonal protein on immunofixation electrophoresis. Normal chemistry profile. I did a plasma porphyrin screen. Everything came back normal except for borderline elevation of copro- porphyrin at 0.9 mcg/L normal up to 0.8  Medications: reviewed  Allergies:  Allergies  Allergen Reactions  . Sulfa Antibiotics Rash    Review of Systems: Since last visit she is having increasing pain in her right leg  Physical Exam: Patient not examined today    Impression and Plan: Idiopathic avascular necrosis of large joints in a Caucasian woman Borderline elevation of a single porphyrin in the plasma. This is probably not significant. 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 Given the fact that we don't have much more information to go on, I'm going to repeat the porphyrin screen again this time with the appropriate fasting diet and holding her nonessential medications. If the test is reproducibly elevated, we will likely need to send additional samples of blood and stool for further testing to a [facility] that has expertise in porphyria.    CC:. Dr. Mckinley Jewel; Dr Patric Dykes, MD 4/12/20138:06 PM

## 2012-02-05 ENCOUNTER — Other Ambulatory Visit: Payer: BC Managed Care – PPO | Admitting: Lab

## 2012-02-05 DIAGNOSIS — M8700 Idiopathic aseptic necrosis of unspecified bone: Secondary | ICD-10-CM

## 2012-02-13 LAB — PORPHYRINS, FRACTIONATION-PLASMA
Coproporphyrin.: 0.6 mcg/L
Total porphyrins: 1.1 mcg/L (ref 1.0–5.6)

## 2012-03-01 IMAGING — CR DG PORTABLE PELVIS
2 series · 2 of 2 positions shown · non-contrast
Comparison: Plain film of the pelvis 09/06/2011.

CLINICAL DATA: Right hip replacement.

PORTABLE PELVIS

[AP (1 of 2)]
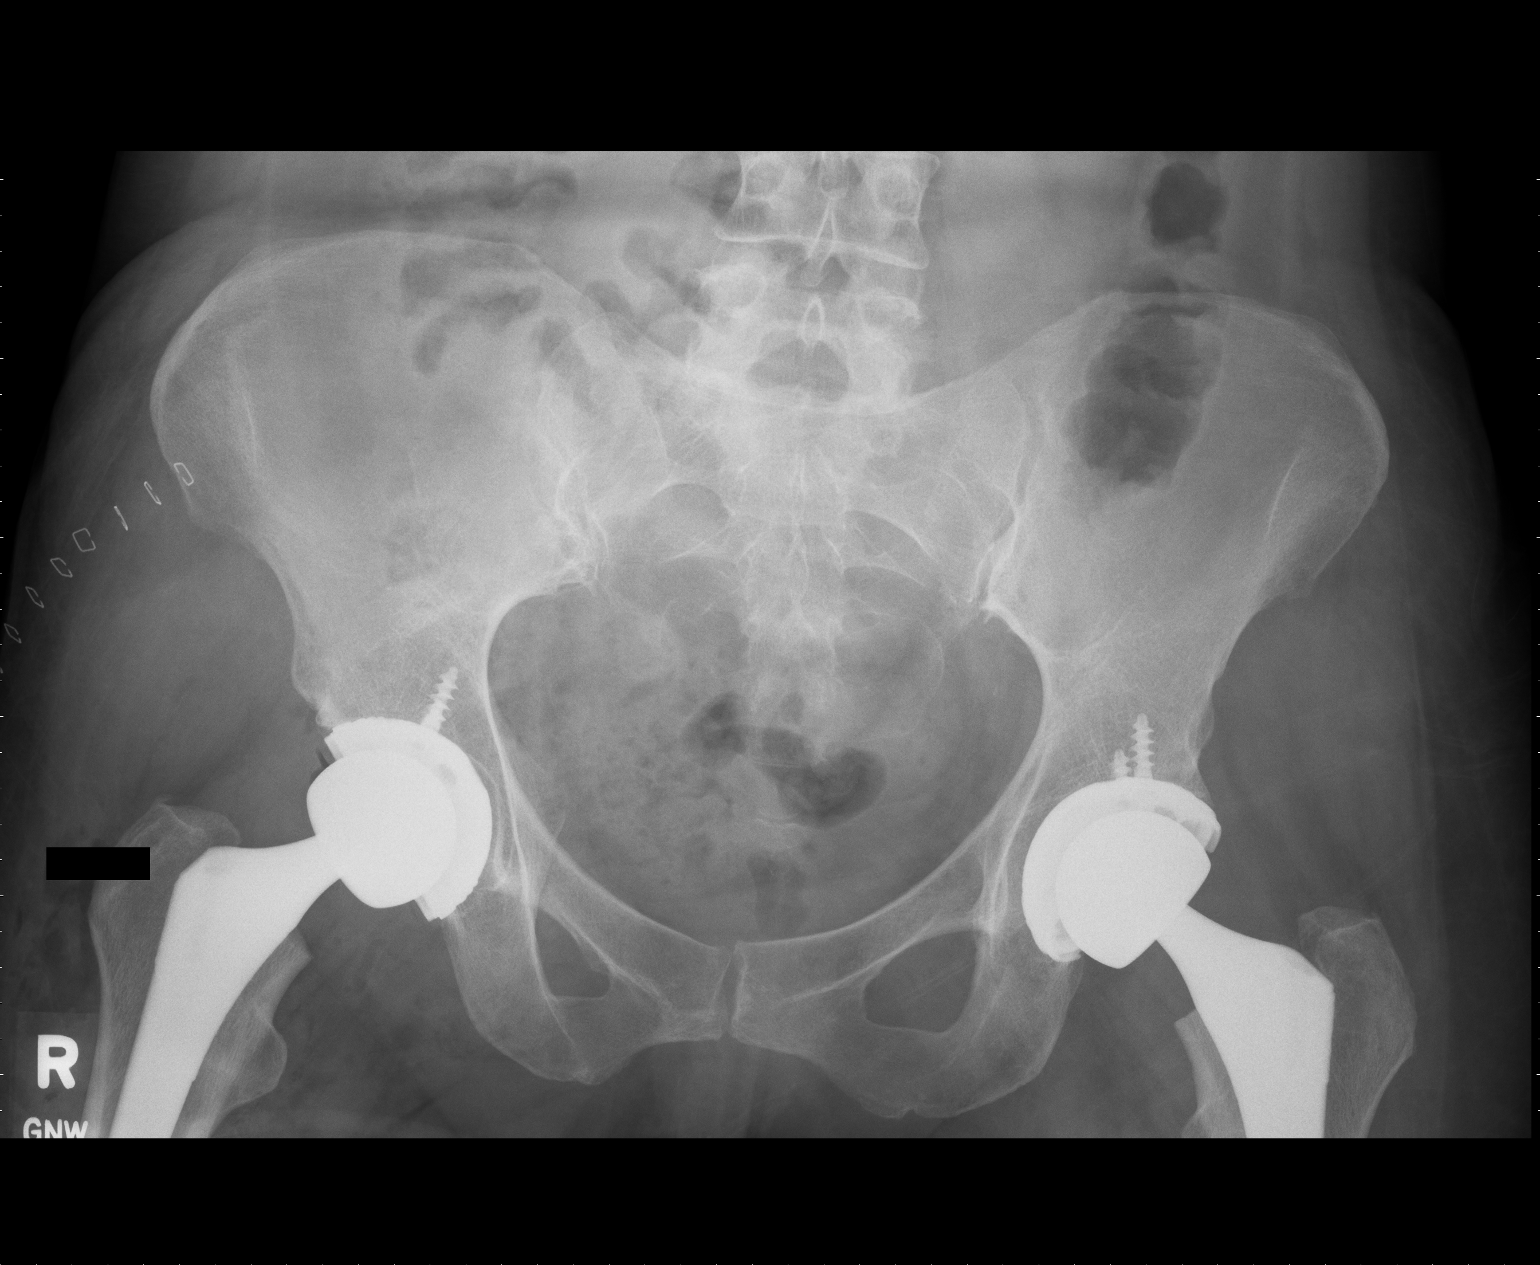

[AP (2 of 2)]
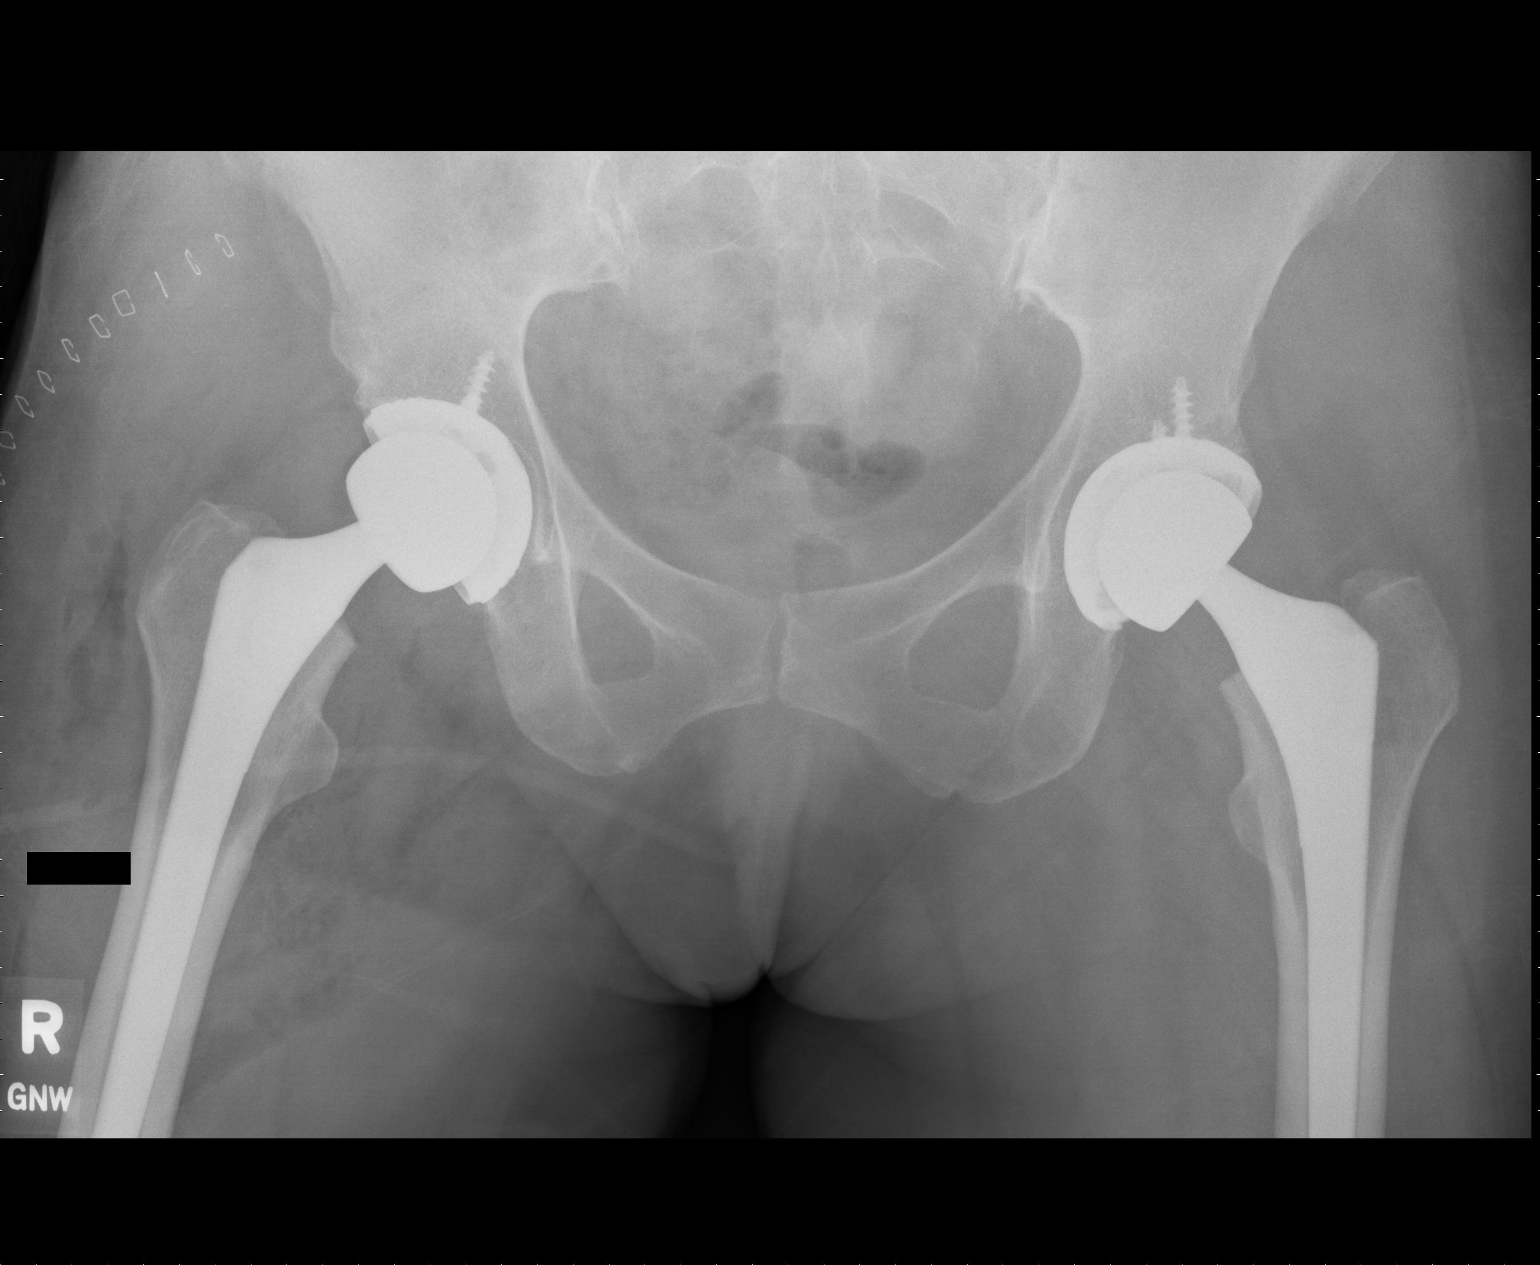

[2 of 2 positions shown; findings below may reference images not displayed]

FINDINGS: The patient has a new right total hip replacement.  There
is gas in the soft tissues from surgery and staples are noted.  The
device is located.  There is no fracture.  Left hip replacement
noted.
IMPRESSION: New right total hip without evidence of complication.

## 2012-03-01 IMAGING — CR DG HIP 1V PORT*R*
1 series · 1 of 1 positions shown · non-contrast
Comparison: None

CLINICAL DATA: Status post right total hip arthroplasty

PORTABLE RIGHT HIP - 1 VIEW

[cross table lat hip]
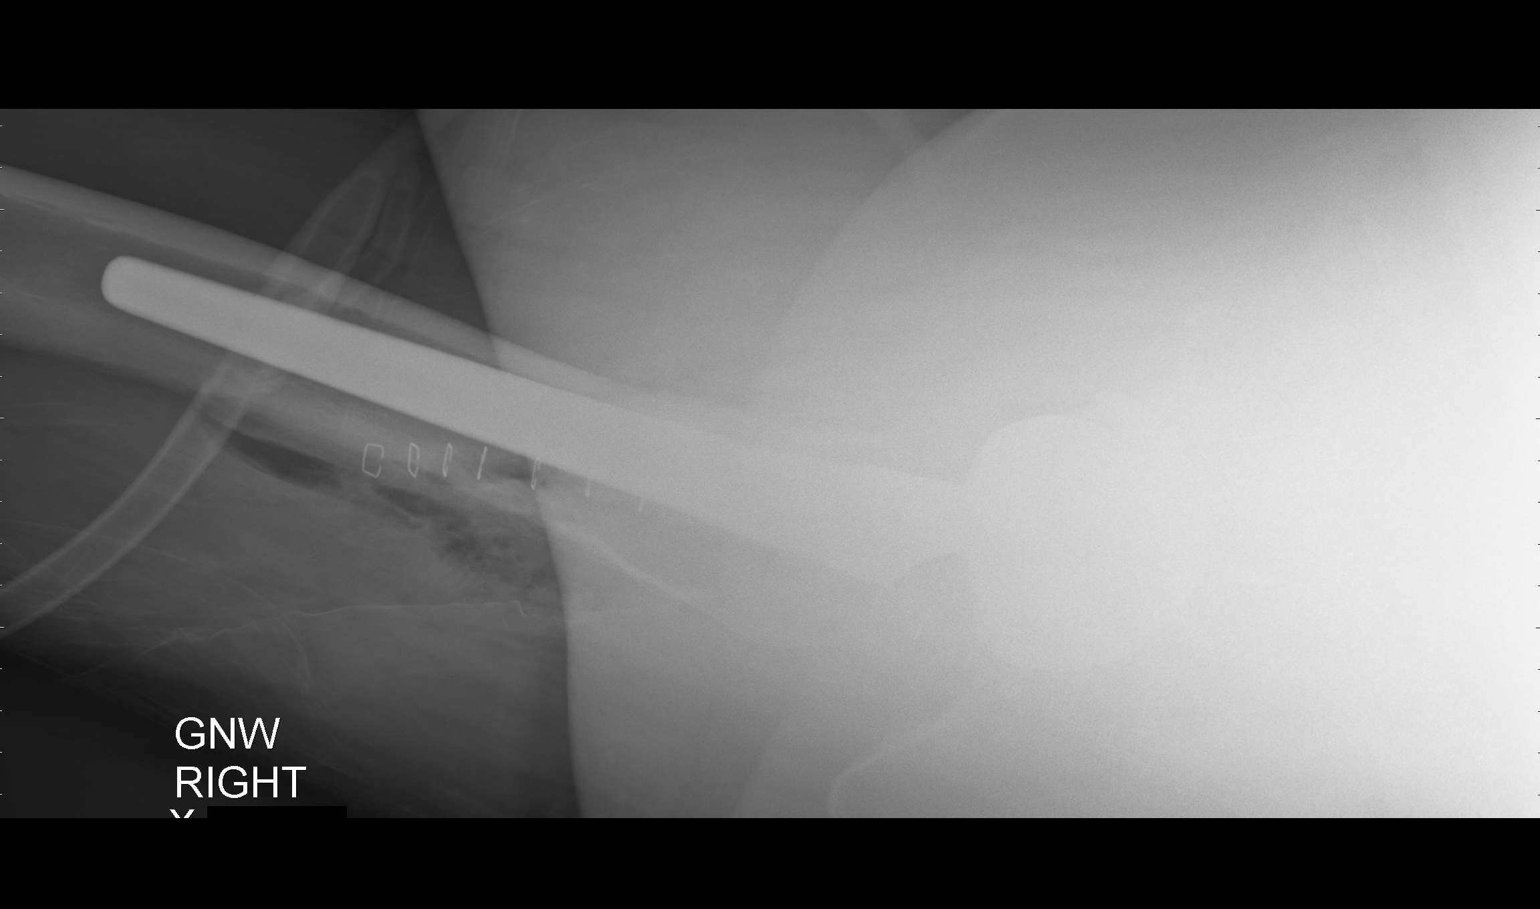

[1 of 1 positions shown; findings below may reference images not displayed]

FINDINGS: Hardware components of a right total hip arthroplasty device noted.

The hardware components are in anatomic alignment.

No complicating features identified.

No evidence for periprosthetic fracture or dislocation.
IMPRESSION: 1.  No complications after right hip arthroplasty.

## 2012-03-04 ENCOUNTER — Telehealth: Payer: Self-pay

## 2012-03-04 NOTE — Telephone Encounter (Signed)
Per Dr Cyndie Chime - all repeat labs were normal.  No need for f/u.  Pt verbalizes understanding. dph

## 2012-03-04 NOTE — Telephone Encounter (Signed)
Received call from pt requesting results from repeat labs performed on 4/15.    Note to Dr Cyndie Chime. dph

## 2012-07-05 ENCOUNTER — Emergency Department (HOSPITAL_COMMUNITY): Payer: BC Managed Care – PPO

## 2012-07-05 ENCOUNTER — Emergency Department (HOSPITAL_COMMUNITY)
Admission: EM | Admit: 2012-07-05 | Discharge: 2012-07-05 | Disposition: A | Discharge status: Home / Self Care | Payer: BC Managed Care – PPO | Attending: Emergency Medicine | Admitting: Emergency Medicine

## 2012-07-05 ENCOUNTER — Encounter (HOSPITAL_COMMUNITY): Payer: Self-pay | Admitting: *Deleted

## 2012-07-05 DIAGNOSIS — Z8543 Personal history of malignant neoplasm of ovary: Secondary | ICD-10-CM | POA: Insufficient documentation

## 2012-07-05 DIAGNOSIS — F341 Dysthymic disorder: Secondary | ICD-10-CM | POA: Insufficient documentation

## 2012-07-05 DIAGNOSIS — I10 Essential (primary) hypertension: Secondary | ICD-10-CM | POA: Insufficient documentation

## 2012-07-05 DIAGNOSIS — R109 Unspecified abdominal pain: Secondary | ICD-10-CM

## 2012-07-05 DIAGNOSIS — K551 Chronic vascular disorders of intestine: Secondary | ICD-10-CM | POA: Insufficient documentation

## 2012-07-05 DIAGNOSIS — E871 Hypo-osmolality and hyponatremia: Secondary | ICD-10-CM | POA: Insufficient documentation

## 2012-07-05 DIAGNOSIS — K5730 Diverticulosis of large intestine without perforation or abscess without bleeding: Secondary | ICD-10-CM | POA: Insufficient documentation

## 2012-07-05 DIAGNOSIS — Z8541 Personal history of malignant neoplasm of cervix uteri: Secondary | ICD-10-CM | POA: Insufficient documentation

## 2012-07-05 DIAGNOSIS — Z8739 Personal history of other diseases of the musculoskeletal system and connective tissue: Secondary | ICD-10-CM | POA: Insufficient documentation

## 2012-07-05 DIAGNOSIS — F17200 Nicotine dependence, unspecified, uncomplicated: Secondary | ICD-10-CM | POA: Insufficient documentation

## 2012-07-05 DIAGNOSIS — E039 Hypothyroidism, unspecified: Secondary | ICD-10-CM | POA: Insufficient documentation

## 2012-07-05 DIAGNOSIS — Z79899 Other long term (current) drug therapy: Secondary | ICD-10-CM | POA: Insufficient documentation

## 2012-07-05 LAB — COMPREHENSIVE METABOLIC PANEL
AST: 10 U/L (ref 0–37)
Albumin: 4.3 g/dL (ref 3.5–5.2)
BUN: 11 mg/dL (ref 6–23)
Calcium: 9.8 mg/dL (ref 8.4–10.5)
Chloride: 109 mEq/L (ref 96–112)
Creatinine, Ser: 0.68 mg/dL (ref 0.50–1.10)
Total Bilirubin: 0.7 mg/dL (ref 0.3–1.2)
Total Protein: 7.5 g/dL (ref 6.0–8.3)

## 2012-07-05 LAB — CBC
HCT: 44.2 % (ref 36.0–46.0)
MCHC: 36 g/dL (ref 30.0–36.0)
MCV: 78.6 fL (ref 78.0–100.0)
Platelets: 211 10*3/uL (ref 150–400)
RDW: 14.6 % (ref 11.5–15.5)
WBC: 8.7 10*3/uL (ref 4.0–10.5)

## 2012-07-05 LAB — URINALYSIS, ROUTINE W REFLEX MICROSCOPIC
Glucose, UA: NEGATIVE mg/dL
Hgb urine dipstick: NEGATIVE
Ketones, ur: NEGATIVE mg/dL
Protein, ur: NEGATIVE mg/dL
Urobilinogen, UA: 0.2 mg/dL (ref 0.0–1.0)

## 2012-07-05 LAB — LIPASE, BLOOD: Lipase: 30 U/L (ref 11–59)

## 2012-07-05 MED ORDER — IOHEXOL 300 MG/ML  SOLN: 100.0000 mL | Freq: Once | INTRAMUSCULAR | Status: DC | PRN

## 2012-07-05 MED ORDER — SODIUM CHLORIDE 0.9 % IV BOLUS (SEPSIS)
1000.0000 mL | Freq: Once | INTRAVENOUS | Status: AC
  Administered 2012-07-05: 1000 mL via INTRAVENOUS

## 2012-07-05 MED ORDER — SODIUM CHLORIDE 0.9 % IV BOLUS (SEPSIS): 1000.0000 mL | Freq: Once | INTRAVENOUS | Status: DC

## 2012-07-05 MED ORDER — POTASSIUM CHLORIDE CRYS ER 20 MEQ PO TBCR
40.0000 meq | EXTENDED_RELEASE_TABLET | Freq: Once | ORAL | Status: AC
  Administered 2012-07-05: 40 meq via ORAL
  Filled 2012-07-05: qty 2

## 2012-07-05 NOTE — ED Notes (Signed)
Intermittent abd pain since January.Dorothy [name]  Has had pain for 2 days .  No N/V   Had diarrhea yesterday with blood in stool.

## 2012-07-05 NOTE — ED Notes (Signed)
Pt states abdominal pain since January. States cramping pain to abdomen with diarrhea yesterday. Denies N/V at this time.

## 2012-07-05 NOTE — ED Provider Notes (Addendum)
History     CSN: [id number]  Arrival date & time 07/05/12  1224   First MD Initiated Contact with Patient 07/05/12 1301      Chief Complaint  Patient presents with  . Abdominal Pain    (Consider location/radiation/quality/duration/timing/severity/associated sxs/prior treatment) Patient is a 56 y.o. female presenting with abdominal pain. The history is provided by the patient.  Abdominal Pain The primary symptoms of the illness include abdominal pain. The primary symptoms of the illness do not include fever, shortness of breath, vomiting, diarrhea, dysuria, vaginal discharge or vaginal bleeding.  Symptoms associated with the illness do not include chills, constipation, hematuria or back pain.  pt c/o mid abd pain for past 9 months on and off. Lasts minutes-hours. Dull, cramping. No radiation. No back or flank pain. No specific exacerbating or alleviating factors. No relation to eating. Normal appetite. No nvd. No abd distension. Having normal bms. No gu c/o. Hx hysterectomy. Denies other abd surgery. No hx diverticula/itis. No hx pud. No hx gallstones or pancreatitis. States has seen pcp in [place] for same a few times in past, no specific diagnosis. States u/s gb/abd negative. No fever or chills. No recent abrupt or acute change in pain. No recent wt loss or wt gain.   Past Medical History  Diagnosis Date  . Constipation   . Hypertension     takes meds  . Hypothyroidism     on Levonthyroxine  . Pneumonia     x 2- last time > 5 years ago  . Depression     Takes Klonopin  . Arthritis     hands and Knees  . Avascular necrosis of bones of both hips 01/17/2012  . Hypothyroidism (acquired) 01/17/2012    Since age 26  . Benign essential HTN 01/17/2012  . Depression with anxiety 01/17/2012  . Ovarian cyst 01/17/2012    1995  BSO  . Pneumonia 01/17/2012    10 yrs ago and 5 years ago   . Rash and other nonspecific skin eruption 01/17/2012    Dorsum hands & extensor surfaces arms;  intermittent  . Cancer     ovarian  . Cancer of cervix 01/17/2012    Past Surgical History  Procedure Date  . Total hip arthroplasty 09/06/2011    Procedure: TOTAL HIP ARTHROPLASTY;  Surgeon: Loreta Ave, MD;  Location: [facility] OR;  Service: Orthopedics;  Laterality: Left;  TOTAL HIP ARTHROPLASTY LEFT SIDE  . Abdominal hysterectomy 1984  . Ovarian cyst removal 1997  . Total hip arthroplasty 10/11/2011    Procedure: TOTAL HIP ARTHROPLASTY;  Surgeon: Loreta Ave, MD;  Location: [facility] OR;  Service: Orthopedics;  Laterality: Right;  120 MINUTES FOR THIS SURGERY  . Joint replacement     Family History  Problem Relation Age of Onset  . Anesthesia problems Neg Hx   . Hypotension Neg Hx   . Malignant hyperthermia Neg Hx   . Pseudochol deficiency Neg Hx     History  Substance Use Topics  . Smoking status: Current Every Day Smoker -- 0.5 packs/day for 35 years    Types: Cigarettes  . Smokeless tobacco: Not on file  . Alcohol Use: No    OB History    Grav Para Term Preterm Abortions TAB SAB Ect Mult Living                  Review of Systems  Constitutional: Negative for fever and chills.  HENT: Negative for neck pain.   Eyes: Negative for  redness.  Respiratory: Negative for cough and shortness of breath.   Cardiovascular: Negative for chest pain and leg swelling.  Gastrointestinal: Positive for abdominal pain. Negative for vomiting, diarrhea and constipation.  Genitourinary: Negative for dysuria, hematuria, flank pain, vaginal bleeding and vaginal discharge.  Musculoskeletal: Negative for back pain.  Skin: Negative for rash.  Neurological: Negative for headaches.  Hematological: Does not bruise/bleed easily.  Psychiatric/Behavioral: Negative for confusion.    Allergies  Sulfa antibiotics  Home Medications   Current Outpatient Rx  Name Route Sig Dispense Refill  . CALCIUM CARBONATE 600 MG PO TABS Oral Take 600 mg by mouth 2 (two) times daily with a meal. chewable    .  CLONAZEPAM 0.5 MG PO TABS Oral Take 0.75 mg by mouth daily.     Marland Kitchen ESTRADIOL 1 MG PO TABS Oral Take 1 mg by mouth daily.      . OMEGA-3 FATTY ACIDS 1000 MG PO CAPS Oral Take 2 g by mouth daily. chewable    . HYDROCHLOROTHIAZIDE 25 MG PO TABS Oral Take 25 mg by mouth daily.      Marland Kitchen LEVOTHYROXINE SODIUM 125 MCG PO TABS Oral Take 125 mcg by mouth daily.      . MULTI-VITAMIN/MINERALS PO TABS Oral Take 1 tablet by mouth daily. Takes chewable--gummies    . VENLAFAXINE HCL ER 150 MG PO CP24 Oral Take 150 mg by mouth 2 (two) times daily.       BP 141/86  Pulse 66  Temp 98 F (36.7 C) (Oral)  Resp 20  Ht 5\' 7"  (1.702 m)  Wt 163 lb (73.936 kg)  BMI 25.53 kg/m2  SpO2 97%  Physical Exam  Nursing note and vitals reviewed. Constitutional: She appears well-developed and well-nourished. No distress.  HENT:  Mouth/Throat: Oropharynx is clear and moist.  Eyes: Conjunctivae normal are normal. No scleral icterus.  Neck: Neck supple. No tracheal deviation present.  Cardiovascular: Normal rate, regular rhythm, normal heart sounds and intact distal pulses.   Pulmonary/Chest: Effort normal and breath sounds normal. No respiratory distress.  Abdominal: Soft. Normal appearance and bowel sounds are normal. She exhibits no distension and no mass. There is no rebound and no guarding.       Mid abd tenderness. No mass felt. No hernia felt.   Genitourinary:       No cva tenderness. Scant light brown stool on rectal, heme neg.   Musculoskeletal: She exhibits no edema.  Neurological: She is alert.  Skin: Skin is warm and dry. No rash noted.  Psychiatric: She has a normal mood and affect.    ED Course  Procedures (including critical care time)   Labs Reviewed  URINALYSIS, ROUTINE W REFLEX MICROSCOPIC  CBC  COMPREHENSIVE METABOLIC PANEL  LIPASE, BLOOD   Results for orders placed during the hospital encounter of 07/05/12  URINALYSIS, ROUTINE W REFLEX MICROSCOPIC      Component Value Range   Color, Urine  YELLOW  YELLOW   APPearance CLEAR  CLEAR   Specific Gravity, Urine 1.015  1.005 - 1.030   pH 7.0  5.0 - 8.0   Glucose, UA NEGATIVE  NEGATIVE mg/dL   Hgb urine dipstick NEGATIVE  NEGATIVE   Bilirubin Urine NEGATIVE  NEGATIVE   Ketones, ur NEGATIVE  NEGATIVE mg/dL   Protein, ur NEGATIVE  NEGATIVE mg/dL   Urobilinogen, UA 0.2  0.0 - 1.0 mg/dL   Nitrite NEGATIVE  NEGATIVE   Leukocytes, UA NEGATIVE  NEGATIVE  CBC      Component  Value Range   WBC 8.7  4.0 - 10.5 K/uL   RBC 5.62 (*) 3.87 - 5.11 MIL/uL   Hemoglobin 15.9 (*) 12.0 - 15.0 g/dL   HCT 16.1  09.6 - 04.5 %   MCV 78.6  78.0 - 100.0 fL   MCH 28.3  26.0 - 34.0 pg   MCHC 36.0  30.0 - 36.0 g/dL   RDW 40.9  81.1 - 91.4 %   Platelets 211  150 - 400 K/uL  COMPREHENSIVE METABOLIC PANEL      Component Value Range   Sodium 125 (*) 135 - 145 mEq/L   Potassium 3.4 (*) 3.5 - 5.1 mEq/L   Chloride 109  96 - 112 mEq/L   CO2 27  19 - 32 mEq/L   Glucose, Bld 93  70 - 99 mg/dL   BUN 11  6 - 23 mg/dL   Creatinine, Ser 7.82  0.50 - 1.10 mg/dL   Calcium 9.8  8.4 - 95.6 mg/dL   Total Protein 7.5  6.0 - 8.3 g/dL   Albumin 4.3  3.5 - 5.2 g/dL   AST 10  0 - 37 U/L   ALT 9  0 - 35 U/L   Alkaline Phosphatase 92  39 - 117 U/L   Total Bilirubin 0.7  0.3 - 1.2 mg/dL   GFR calc non Af Amer >90  >90 mL/min   GFR calc Af Amer >90  >90 mL/min  LIPASE, BLOOD      Component Value Range   Lipase 30  11 - 59 U/L   Ct Abdomen Pelvis W Contrast  07/05/2012  *RADIOLOGY REPORT*  Clinical Data: Mid to lower back pain for 1 week  CT ABDOMEN AND PELVIS WITH CONTRAST  Technique:  Multidetector CT imaging of the abdomen and pelvis was performed following the standard protocol during bolus administration of intravenous contrast.  Contrast:  100 ml Omnipaque-300  Comparison: None.  Findings: The lung bases are clear.  Two well circumscribed low attenuation structure are present in the dome of the liver most consistent with cysts.  However in the dome as well of the  right lobe there is an enhancing focus present which may well represent a hypervascular hemangioma.  This area becomes almost isointense with liver on delayed images and therefore is most consistent with hemangioma.  No ductal dilatation is seen.  No calcified gallstones are noted.  The pancreas is normal in size and the pancreatic duct is minimally prominent.  The adrenal glands and spleen are unremarkable.  The stomach is moderately distended with contrast with no abnormality noted.  The kidneys enhance with no calculus or mass and no hydronephrosis is seen.  On delayed images, the pelvocaliceal systems are unremarkable.  The abdominal aorta is normal in caliber with moderate atheromatous change for age.  There does appear to be occlusion of the SMA proximally with reconstitution distally.  No adenopathy is seen.  The urinary bladder is moderately urine distended with no abnormality noted.  Linear artifacts are created by bilateral total hip replacements, obscuring the lower pelvic anatomic detail. The rectosigmoid colon is decompressed and difficult to evaluate. There are scattered rectosigmoid colonic diverticula present. The appendix is not definitely seen but no inflammatory process is noted.  The appendix may be partially obscured by linear artifact from hip replacement, appearing to be very low in the right pelvis anteriorly.  No bony abnormality is seen.  IMPRESSION:  1.  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 This could  possibly be the cause of the abdominal pain creating intestinal ischemia.    2.  Rectosigmoid colonic diverticula.   Original Report Authenticated By: Juline Patch, M.D.       MDM  Labs. Ct.   Reviewed nursing notes and prior charts for additional history.   Discussed ct results w pt. Recheck abd soft nt. Pt does note that she gets her abd pain more commonly during day, occasionally more so after eating. Rectal heme neg. Repeat abd exam soft nt. Pt  comfortable, no current pain.   Pt denies nsaid/asa use.  Called gi md on call to discuss pt, ct. Gi in procedure, will return call to [place] as soon as done.   Discussed pt, labs, ct scan results w Dr Kendell Bane, on call for gi. He took pts info down and indicates his office will call pt this Monday morning to arrange very close follow up, further eval w him.   Discussed plan w pt. Pt agreeable, states feels ready to go home, no pain currently. abd soft nt.  Also discussed low na w pt, 2 liters ns iv in ed. plan to hold hctz and close f/u.  Pt denies excessive/heavy water ingestion. also discussed incidental note diverticula on ct.      Suzi Roots, MD 07/05/12 1631  Suzi Roots, MD 07/05/12 [phone]

## 2012-07-10 ENCOUNTER — Encounter: Payer: Self-pay | Admitting: Urgent Care

## 2012-07-10 ENCOUNTER — Ambulatory Visit: Payer: BC Managed Care – PPO | Admitting: Gastroenterology

## 2012-07-10 ENCOUNTER — Ambulatory Visit (INDEPENDENT_AMBULATORY_CARE_PROVIDER_SITE_OTHER): Payer: BC Managed Care – PPO | Admitting: Urgent Care

## 2012-07-10 VITALS — BP 150/81 | HR 68 | Temp 98.7°F | Ht 67.0 in | Wt 168.8 lb

## 2012-07-10 DIAGNOSIS — E871 Hypo-osmolality and hyponatremia: Secondary | ICD-10-CM

## 2012-07-10 DIAGNOSIS — R109 Unspecified abdominal pain: Secondary | ICD-10-CM | POA: Insufficient documentation

## 2012-07-10 DIAGNOSIS — K551 Chronic vascular disorders of intestine: Secondary | ICD-10-CM | POA: Insufficient documentation

## 2012-07-10 DIAGNOSIS — K921 Melena: Secondary | ICD-10-CM

## 2012-07-10 LAB — BASIC METABOLIC PANEL
BUN: 12 mg/dL (ref 6–23)
Chloride: 106 mEq/L (ref 96–112)
Glucose, Bld: 71 mg/dL (ref 70–99)
Potassium: 4.6 mEq/L (ref 3.5–5.3)

## 2012-07-10 NOTE — Assessment & Plan Note (Signed)
Check Met 7, if persistent hyponatremia or hypokalemia, she will need FU w/ PCP.

## 2012-07-10 NOTE — Assessment & Plan Note (Addendum)
Dorothy [name] is a pleasant 56 y.o. female who presents with chronic (greater than 1 year) post-prandial abdominal pain.  CT suggests SMA stenosis.  I am unsure if this is contributing to her pain.  Will review films & discuss w/ Dr Jena Gauss.  She also eventually needs colonoscopy & possibly EGD to further evaluate her symptoms.  Differentials include inflammatory bowel disease, PUD, IBS w/ benign hematochezia.  I will review CT w/ Dr Jena Gauss & further recommendations to follow TO ER if abdominal pain returns 1-800-QUIT-NOW for help quitting smoking

## 2012-07-10 NOTE — Patient Instructions (Addendum)
Go get your labs now I will review CT w/ Dr Jena Gauss & further recommendations to follow TO ER if abdominal pain returns 1-800-QUIT-NOW for help quitting smoking

## 2012-07-10 NOTE — Progress Notes (Signed)
Primary Care Physician:  Ninfa Linden, NP Primary Gastroenterologist:  Dr. Jena Gauss  Chief Complaint  Patient presents with  . Abdominal Pain  . Diverticulosis    HPI:  Dorothy [name] is a 56 y.o. female here as a new patient for evaluation of abdominal pain.  She is s/p 2 hip replacements latter part last yr for avascular necrosis.  Since that time, she tells me she has ben "treated for a stomach ulcer" & treated for diverticulitis 6 weeks ago.  She believes she took antibiotics & Cipro/flagyl sound familiar, but she is unsure.  She has been having lower abdominal pain that is intermittent.  It usually lasts all day long.  She has seen some diarrhea with the pain and about 4 loose stools per day.  She has seen bright red blood in the commode in moderate amounts.  She denies constipation, melena or weight loss.  She has had fever & chills w/ the pain.  The episodes occur a couple times per month.  She denies vomiitng.   Denies heartburn, indigestion, nausea, vomiting, dysphagia, odynophagia or anorexia.  Her weight has been stable.  Her pain is post-prandial.  She does drink lots of diet [employer].  She was found to have hyponatremia on last labs.  She denies drinking excessive water.  She has been using diet [place] envelopes to add to a glass of water every evening.  Her HCTZ every am has been on hold since hyponatremia in ER.  She has had a negative ANA & porphyrins were normal 2nd time.  She had a single elevated coproporphyrin on first lab, felt not to be clinically significant.  She is followed by Dr Cyndie Chime for AVN.  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 This could possibly be the cause of the abdominal pain creating intestinal ischemia per radiologist's report. Rectosigmoid colonic diverticula.  Recent Results (from the past 672 hour(s))  URINALYSIS, ROUTINE W REFLEX MICROSCOPIC   Collection Time   07/05/12  1:09 PM   Component Value Range   Color, Urine YELLOW  YELLOW   APPearance CLEAR  CLEAR   Specific Gravity, Urine 1.015  1.005 - 1.030   pH 7.0  5.0 - 8.0   Glucose, UA NEGATIVE  NEGATIVE mg/dL   Hgb urine dipstick NEGATIVE  NEGATIVE   Bilirubin Urine NEGATIVE  NEGATIVE   Ketones, ur NEGATIVE  NEGATIVE mg/dL   Protein, ur NEGATIVE  NEGATIVE mg/dL   Urobilinogen, UA 0.2  0.0 - 1.0 mg/dL   Nitrite NEGATIVE  NEGATIVE   Leukocytes, UA NEGATIVE  NEGATIVE  CBC   Collection Time   07/05/12  1:20 PM      Component Value Range   WBC 8.7  4.0 - 10.5 K/uL   RBC 5.62 (*) 3.87 - 5.11 MIL/uL   Hemoglobin 15.9 (*) 12.0 - 15.0 g/dL   HCT 11.9  14.7 - 82.9 %   MCV 78.6  78.0 - 100.0 fL   MCH 28.3  26.0 - 34.0 pg   MCHC 36.0  30.0 - 36.0 g/dL   RDW 56.2  13.0 - 86.5 %   Platelets 211  150 - 400 K/uL  COMPREHENSIVE METABOLIC PANEL   Collection Time   07/05/12  1:20 PM      Component Value Range   Sodium 125 (*) 135 - 145 mEq/L   Potassium 3.4 (*) 3.5 - 5.1 mEq/L   Chloride 109  96 - 112 mEq/L   CO2 27  19 - 32 mEq/L   Glucose, Bld 93  70 - 99 mg/dL   BUN 11  6 - 23 mg/dL   Creatinine, Ser 2.13  0.50 - 1.10 mg/dL   Calcium 9.8  8.4 - 08.6 mg/dL   Total Protein 7.5  6.0 - 8.3 g/dL   Albumin 4.3  3.5 - 5.2 g/dL   AST 10  0 - 37 U/L   ALT 9  0 - 35 U/L   Alkaline Phosphatase 92  39 - 117 U/L   Total Bilirubin 0.7  0.3 - 1.2 mg/dL   GFR calc non Af Amer >90  >90 mL/min   GFR calc Af Amer >90  >90 mL/min  LIPASE, BLOOD   Collection Time   07/05/12  1:20 PM      Component Value Range   Lipase 30  11 - 59 U/L   Past Medical History  Diagnosis Date  . Constipation   . Hypertension     takes meds  . Hypothyroidism     on Levonthyroxine  . Pneumonia     x 2- last time > 5 years ago  . Depression     Takes Klonopin  . Arthritis     hands and Knees  . Avascular necrosis of bones of both hips 01/17/2012  . Hypothyroidism (acquired) 01/17/2012    Since age 5  . Benign essential HTN 01/17/2012  .  Depression with anxiety 01/17/2012  . Ovarian cyst 01/17/2012    1995  BSO  . Pneumonia 01/17/2012    10 yrs ago and 5 years ago   . Rash and other nonspecific skin eruption 01/17/2012    Dorsum hands & extensor surfaces arms; intermittent  . Cancer     ovarian  . Cancer of cervix 01/17/2012    Past Surgical History  Procedure Date  . Total hip arthroplasty 09/06/2011    Procedure: TOTAL HIP ARTHROPLASTY;  Surgeon: Loreta Ave, MD;  Location: [facility] OR;  Service: Orthopedics;  Laterality: Left;  TOTAL HIP ARTHROPLASTY LEFT SIDE  . Abdominal hysterectomy 1984  . Ovarian cyst removal 1997  . Total hip arthroplasty 10/11/2011    Procedure: TOTAL HIP ARTHROPLASTY;  Surgeon: Loreta Ave, MD;  Location: [facility] OR;  Service: Orthopedics;  Laterality: Right;  120 MINUTES FOR THIS SURGERY  . Joint replacement   . Colonoscopy 2009    [employer]-polyps, 5 yr FU    Current Outpatient Prescriptions  Medication Sig Dispense Refill  . calcium carbonate (OS-CAL) 600 MG TABS Take 600 mg by mouth 2 (two) times daily with a meal. chewable      . clonazePAM (KLONOPIN) 0.5 MG tablet Take 0.75 mg by mouth daily.       Marland Kitchen estradiol (ESTRACE) 1 MG tablet Take 1 mg by mouth daily.        . fish oil-omega-3 fatty acids 1000 MG capsule Take 2 g by mouth daily. chewable      . Multiple Vitamins-Minerals (MULTIVITAMIN WITH MINERALS) tablet Take 1 tablet by mouth daily. Takes chewable--gummies      . omeprazole (PRILOSEC) 20 MG capsule Take 20 mg by mouth daily.       Marland Kitchen venlafaxine (EFFEXOR-XR) 150 MG 24 hr capsule Take 150 mg by mouth 2 (two) times daily.         Allergies as of 07/10/2012 - Review Complete 07/10/2012  Allergen Reaction Noted  . Sulfa antibiotics Rash 09/06/2011    Family History:There is no known family history of colorectal  carcinoma , liver disease, or inflammatory bowel disease.  Problem Relation Age of Onset  . Anesthesia problems Neg Hx   . Hypotension Neg Hx   . Malignant  hyperthermia Neg Hx   . Pseudochol deficiency Neg Hx   . Breast cancer Sister     History   Social History  . Marital Status: Widowed    Spouse Name: N/A    Number of Children: 1  . Years of Education: N/A   Occupational History  . disabled    Social History Main Topics  . Smoking status: Current Every Day Smoker -- 0.5 packs/day for 35 years    Types: Cigarettes  . Smokeless tobacco: Not on file  . Alcohol Use: No  . Drug Use: No  . Sexually Active: Not on file   Other Topics Concern  . Not on file   Social History Narrative  . No narrative on file    Review of Systems: Gen: see HPI CV: Denies chest pain, angina, palpitations, syncope, orthopnea, PND, peripheral edema, and claudication. Resp: Denies dyspnea at rest, dyspnea with exercise, cough, sputum, wheezing, coughing up blood, and pleurisy. GI: Denies vomiting blood, jaundice, and fecal incontinence.   Denies dysphagia or odynophagia. GU : Denies urinary burning, blood in urine, urinary frequency, urinary hesitancy, nocturnal urination, and urinary incontinence. MS: chronic joint pain, knee pain  Derm: Denies rash, itching, dry skin, hives, moles, warts, or unhealing ulcers.  Psych: Denies depression, anxiety, memory loss, suicidal ideation, hallucinations, paranoia, and confusion. Heme: Denies bruising or enlarged lymph nodes. Neuro:  Denies any headaches, dizziness, paresthesias. Endo:  Denies any problems with DM, thyroid, adrenal function.  Physical Exam: BP 150/81  Pulse 68  Temp 98.7 F (37.1 C) (Temporal)  Ht 5\' 7"  (1.702 m)  Wt 168 lb 12.8 oz (76.567 kg)  BMI 26.44 kg/m2 No LMP recorded. Patient has had a hysterectomy. General:   Alert,  Well-developed, well-nourished, pleasant and cooperative in NAD.  Accompanied by her aunt today. Head:  Normocephalic and atraumatic. Eyes:  Sclera clear, no icterus.   Conjunctiva pink. Ears:  Normal auditory acuity. Nose:  No deformity, discharge, or  lesions. Mouth:  No deformity or lesions,oropharynx pink & moist. Neck:  Supple; no masses or thyromegaly. Lungs:  Clear throughout to auscultation.   No wheezes, crackles, or rhonchi. No acute distress. Heart:  Regular rate and rhythm; no murmurs, clicks, rubs,  or gallops. Abdomen:  Normal bowel sounds.  No bruits.  Soft, non-distended.  +mild BLQ tenderness without rebound or guarding.  No masses, hepatosplenomegaly or hernias noted.     Rectal:  Deferred. Msk:  Symmetrical without gross deformities. Normal posture. Pulses:  Normal pulses noted. Extremities:  No clubbing or edema. Neurologic:  Alert and  oriented x4;  grossly normal neurologically. Skin:  Intact without significant lesions or rashes. Lymph Nodes:  No significant cervical adenopathy. Psych:  Alert and cooperative. Normal mood and affect.

## 2012-07-10 NOTE — Assessment & Plan Note (Signed)
See Abd Pain

## 2012-07-10 NOTE — Progress Notes (Signed)
Faxed to PCP

## 2012-07-10 NOTE — Assessment & Plan Note (Signed)
See abd pain.  

## 2012-07-11 ENCOUNTER — Other Ambulatory Visit: Payer: Self-pay | Admitting: Urgent Care

## 2012-07-11 ENCOUNTER — Telehealth: Payer: Self-pay | Admitting: Urgent Care

## 2012-07-11 DIAGNOSIS — K900 Celiac disease: Secondary | ICD-10-CM

## 2012-07-11 NOTE — Progress Notes (Signed)
Reviewed CT with Dr. Jena Gauss & Dr. Tyron Russell. Celiac is tight as well.  ASAP referral to IR for angiogram/stenting. Pt may also need referral for arteritis.

## 2012-07-11 NOTE — Progress Notes (Signed)
Faxed to [employer] [facility]

## 2012-07-11 NOTE — Progress Notes (Signed)
Quick Note:  She will discuss whether she should resume HCTZ w/ Ninfa Linden, NP  ______

## 2012-07-11 NOTE — Telephone Encounter (Signed)
Dorothy [name], please call the number below to get consult for interventional radiology ASAP [id number] for mesenteric ischemia/stenting celiac. I discussed w/ pt this morning.  She agrees w/ plan. Pt given lab results.  Advised to go to ER if severe pain. Also advised she may need referral for further work-up of arteritis. Please arrange IR.   Thanks

## 2012-07-11 NOTE — Telephone Encounter (Signed)
Message copied by Joselyn Arrow on Thu Jul 11, 2012  7:56 AM ------      Message from: Corbin Ade      Created: Wed Jul 10, 2012  4:48 PM       Dr. Tyron Russell called back; got up with Dr. Deanne Coffer this afternoon. Dr. Deanne Coffer feels that she needs to come down to the interventional clinic for consultation and likely needs to have a angiogram and stenting of her celiac. Of note, there is inflammation around her SMA which brings the possibility of an arteritis into question. At any rate, go ahead and get her scheduled for an interventional consultation down in [place]. The number is [phone]

## 2012-07-11 NOTE — Telephone Encounter (Signed)
Referral has been sent over to Tammy at [facility] and she will call and schedule the patient

## 2012-07-11 NOTE — Progress Notes (Signed)
Quick Note:  Results reviewed w/ pt. [id number], KATHY, NP  ______

## 2012-07-16 ENCOUNTER — Other Ambulatory Visit: Payer: Self-pay | Admitting: Diagnostic Radiology

## 2012-07-16 ENCOUNTER — Ambulatory Visit
Admission: RE | Admit: 2012-07-16 | Discharge: 2012-07-16 | Disposition: A | Discharge status: Home / Self Care | Payer: BC Managed Care – PPO | Source: Ambulatory Visit | Attending: Urgent Care | Admitting: Urgent Care

## 2012-07-16 ENCOUNTER — Ambulatory Visit (HOSPITAL_COMMUNITY)
Admission: RE | Admit: 2012-07-16 | Discharge: 2012-07-16 | Disposition: A | Discharge status: Home / Self Care | Payer: BC Managed Care – PPO | Source: Ambulatory Visit | Attending: Diagnostic Radiology | Admitting: Diagnostic Radiology

## 2012-07-16 VITALS — BP 147/78 | HR 69 | Temp 98.5°F | Resp 16 | Ht 67.0 in | Wt 165.0 lb

## 2012-07-16 DIAGNOSIS — I701 Atherosclerosis of renal artery: Secondary | ICD-10-CM | POA: Insufficient documentation

## 2012-07-16 DIAGNOSIS — R109 Unspecified abdominal pain: Secondary | ICD-10-CM | POA: Insufficient documentation

## 2012-07-16 DIAGNOSIS — K55059 Acute (reversible) ischemia of intestine, part and extent unspecified: Secondary | ICD-10-CM | POA: Insufficient documentation

## 2012-07-16 DIAGNOSIS — K900 Celiac disease: Secondary | ICD-10-CM

## 2012-07-16 MED ORDER — IOHEXOL 350 MG/ML SOLN
100.0000 mL | Freq: Once | INTRAVENOUS | Status: AC | PRN
  Administered 2012-07-16: 100 mL via INTRAVENOUS

## 2012-07-17 NOTE — Progress Notes (Signed)
Patient ID: Dorothy [name], female   DOB: 1956-01-21, 56 y.o.   MRN: [id number] I reviewed the CTA examination from 07/16/12.  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  The combination of these imaging findings and the patient's symptoms are suggestive for chronic mesenteric ischemia.  Discussed these findings with the patient on the telephone.  She needs a referral to Vascular Surgery to discuss revascularization surgical options.  We will set up the referral.

## 2012-07-22 NOTE — Progress Notes (Signed)
Quick Note:  Reviewed. FYI, Dr Jena Gauss- ______

## 2012-07-26 NOTE — Progress Notes (Signed)
Quick Note:  Pt will need FU OV after vascular surgeon's intervention. Please NIC chart for FU OV around 6 weeks w/ me/RMR. Thanks ______

## 2012-07-31 ENCOUNTER — Encounter: Payer: Self-pay | Admitting: Vascular Surgery

## 2012-08-01 ENCOUNTER — Ambulatory Visit (INDEPENDENT_AMBULATORY_CARE_PROVIDER_SITE_OTHER): Payer: BC Managed Care – PPO | Admitting: Vascular Surgery

## 2012-08-01 ENCOUNTER — Encounter: Payer: Self-pay | Admitting: Vascular Surgery

## 2012-08-01 ENCOUNTER — Encounter: Payer: Self-pay | Admitting: Internal Medicine

## 2012-08-01 VITALS — BP 135/88 | HR 64 | Resp 16 | Ht 67.5 in | Wt 166.0 lb

## 2012-08-01 DIAGNOSIS — K559 Vascular disorder of intestine, unspecified: Secondary | ICD-10-CM

## 2012-08-01 DIAGNOSIS — Z01818 Encounter for other preprocedural examination: Secondary | ICD-10-CM

## 2012-08-01 NOTE — Progress Notes (Signed)
[facility] [place] HISTORY AND PHYSICAL   History of Present Illness:  Patient is a 56 y.o. year old female who presents for evaluation of abdominal pain and mesenteric ischemia.  The patient has had a history of diffuse lower abdominal pain for approximately one year. This sometimes occurs with eating. However, it can occur randomly. She has lost 25 pounds in the last 5 months. She has also developed food fear of the last few months. She denies nausea vomiting or diarrhea. She denies any blood per rectum. She states that she does have right first that occur at the time of her onset of pain. She states that the pain can last for several hours with no relief. She has not had any interventions to improve her symptoms. She does smoke one half pack of cigarettes per day. Greater than 3 minutes they were spent regarding smoking cessation counseling. Other medical problems include hypertension, hypothyroidism, depression, multiple joint arthritis. These are all currently controlled.  Past Medical History  Diagnosis Date  . Constipation   . Hypertension     takes meds  . Hypothyroidism     on Levonthyroxine  . Pneumonia     x 2- last time > 5 years ago  . Depression     Takes Klonopin  . Arthritis     hands and Knees  . Avascular necrosis of bones of both hips 01/17/2012  . Hypothyroidism (acquired) 01/17/2012    Since age 86  . Benign essential HTN 01/17/2012  . Depression with anxiety 01/17/2012  . Ovarian cyst 01/17/2012    1995  BSO  . Pneumonia 01/17/2012    10 yrs ago and 5 years ago   . Rash and other nonspecific skin eruption 01/17/2012    Dorsum hands & extensor surfaces arms; intermittent  . Cancer     ovarian  . Cancer of cervix 01/17/2012    Past Surgical History  Procedure Date  . Total hip arthroplasty 09/06/2011    Procedure: TOTAL HIP ARTHROPLASTY;  Surgeon: Loreta Ave, MD;  Location: [facility] OR;  Service: Orthopedics;  Laterality: Left;  TOTAL HIP  ARTHROPLASTY LEFT SIDE  . Abdominal hysterectomy 1984  . Ovarian cyst removal 1997  . Total hip arthroplasty 10/11/2011    Procedure: TOTAL HIP ARTHROPLASTY;  Surgeon: Loreta Ave, MD;  Location: [facility] OR;  Service: Orthopedics;  Laterality: Right;  120 MINUTES FOR THIS SURGERY  . Joint replacement   . Colonoscopy 2009    [place]-polyps, 5 yr FU     Social History History  Substance Use Topics  . Smoking status: Current Every Day Smoker -- 0.5 packs/day for 35 years    Types: Cigarettes  . Smokeless tobacco: Never Used   Comment: pt states that she wants to quit but needs help was advised to call 1-800-QUITNOW and to speak with PCP about avaliable aids  . Alcohol Use: No    Family History Family History  Problem Relation Age of Onset  . Anesthesia problems Neg Hx   . Hypotension Neg Hx   . Malignant hyperthermia Neg Hx   . Pseudochol deficiency Neg Hx   . Breast cancer Sister   . Cancer Sister     breast  . Cancer Mother     lung  . Hypertension Son     Allergies  Allergies  Allergen Reactions  . Sulfa Antibiotics Rash     Current Outpatient Prescriptions  Medication Sig Dispense Refill  . clonazePAM (KLONOPIN) 0.5 MG  tablet Take 0.5 mg by mouth daily. Take 1 1/2 tab q day      . estradiol (ESTRACE) 1 MG tablet Take 1 mg by mouth daily.        . hydrochlorothiazide (HYDRODIURIL) 25 MG tablet Take 25 mg by mouth daily. Take 1/2 tablet per day      . Levothyroxine Sodium 112 MCG CAPS Take by mouth daily.      Marland Kitchen omeprazole (PRILOSEC) 20 MG capsule Take 20 mg by mouth daily.       . calcium carbonate (OS-CAL) 600 MG TABS Take 600 mg by mouth 2 (two) times daily with a meal. chewable      . fish oil-omega-3 fatty acids 1000 MG capsule Take 2 g by mouth daily. chewable      . Multiple Vitamins-Minerals (MULTIVITAMIN WITH MINERALS) tablet Take 1 tablet by mouth daily. Takes chewable--gummies      . venlafaxine (EFFEXOR-XR) 150 MG 24 hr capsule Take 150 mg by mouth 2  (two) times daily.         ROS:   General:  + weight loss, Fever, + chills  HEENT: No recent headaches, no nasal bleeding, no visual changes, no sore throat  Neurologic: No dizziness, blackouts, seizures. No recent symptoms of stroke or mini- stroke. No recent episodes of slurred speech, or temporary blindness.  Cardiac: No recent episodes of chest pain/pressure, no shortness of breath at rest.  No shortness of breath with exertion.  Denies history of atrial fibrillation or irregular heartbeat  Vascular: No history of rest pain in feet.  No history of claudication.  No history of non-healing ulcer, No history of DVT   Pulmonary: No home oxygen, no productive cough, no hemoptysis,  No asthma or wheezing  Musculoskeletal:  [x ] Arthritis, [ ]  Low back pain,  [x ] Joint pain  Hematologic:No history of hypercoagulable state.  No history of easy bleeding.  No history of anemia  Gastrointestinal: No hematochezia or melena,  No gastroesophageal reflux, no trouble swallowing  Urinary: [ ]  chronic Kidney disease, [ ]  on HD - [ ]  MWF or [ ]  TTHS, [ ]  Burning with urination, [ ]  Frequent urination, [ ]  Difficulty urinating;   Skin: No rashes  Psychological: + history of anxiety,  + history of depression   Physical Examination  Filed Vitals:   08/01/12 0845  BP: 135/88  Pulse: 64  Resp: 16  Height: 5' 7.5" (1.715 m)  Weight: 166 lb (75.297 kg)  SpO2: 100%    Body mass index is 25.62 kg/(m^2).  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 She also has a 2-3 segment occlusion of the proximal superior mesenteric artery. The inferior mesenteric artery is diminutive. There was no evidence of abdominal aneurysm. The renal arteries were patent.   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   PLAN:  I  believe the best option for the patient will be median arcuate ligament release as well as superior mesenteric artery bypass. However may also need to consider aortic to superior mesenteric and celiac artery bypass if this is in atherosclerotic process in the celiac rather than ligament compression. Risks benefits possible complications and procedure details were explained to the patient today including but not limited to bleeding infection cutting for an myocardial events respiratory events, death. She understands and agrees to proceed. We will schedule her for cardiac risk stratification with the next few weeks an operation for followup. Smoking cessation was strongly emphasized.  Fabienne Bruns, MD [facility] [place] Office: [phone] Pager: [phone]

## 2012-08-02 ENCOUNTER — Telehealth: Payer: Self-pay | Admitting: Vascular Surgery

## 2012-08-02 NOTE — Addendum Note (Signed)
Addended by: Sharee Pimple on: 08/02/2012 08:39 AM   Modules accepted: Orders

## 2012-08-02 NOTE — Telephone Encounter (Signed)
Dr Darrick Penna requested that our office refer Dorothy [name] for a pre-op cardiac evaluation prior to aortomesenteric aorto celiac bypass. I have scheduled Dorothy [name] with [facility]/ Dr Clifton James on 08/26/12 @ 4:00pm- patient was notified by voicemail on cell #. Her home # has been consistently busy. Dpm

## 2012-08-26 ENCOUNTER — Ambulatory Visit (INDEPENDENT_AMBULATORY_CARE_PROVIDER_SITE_OTHER): Payer: BC Managed Care – PPO | Admitting: Cardiovascular Disease

## 2012-08-26 ENCOUNTER — Encounter: Payer: Self-pay | Admitting: Cardiovascular Disease

## 2012-08-26 VITALS — BP 146/90 | HR 64 | Ht 67.0 in | Wt 165.0 lb

## 2012-08-26 DIAGNOSIS — F17200 Nicotine dependence, unspecified, uncomplicated: Secondary | ICD-10-CM

## 2012-08-26 DIAGNOSIS — Z01810 Encounter for preprocedural cardiovascular examination: Secondary | ICD-10-CM

## 2012-08-26 DIAGNOSIS — Z720 Tobacco use: Secondary | ICD-10-CM

## 2012-08-26 NOTE — Progress Notes (Signed)
History of Present Illness: 56 yo WF with history of HTN, hypothyroidism, PAD, tobacco abuse with recent diagnosis of mesenteric ischemia referred here today for cardiac evaluation prior to planned superior mesenteric artery bypass. She has been having abdominal pain, worsened with meals. 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 The inferior mesenteric artery is diminutive. Given her symptoms, Dr. Darrick Penna is planning ligament release with possible bypass of the celiac and superior mesenteric arteries. She is here today for pre-operative evaluation. She tells me that she smokes 1/2 ppd. She has done this for 40 years. She had no prior cardiac history. She has no family history of CAD. She denies chest pain, SOB, palpitations, near syncope or syncope. She feels well except for her abdominal pain as described above.   Primary Care Physician: Ninfa Linden   Past Medical History  Diagnosis Date  . Constipation   . Hypertension     takes meds  . Hypothyroidism     on Levonthyroxine  . Pneumonia     x 2- last time > 5 years ago  . Depression     Takes Klonopin  . Arthritis     hands and Knees  . Avascular necrosis of bones of both hips 01/17/2012  . Hypothyroidism (acquired) 01/17/2012    Since age 24  . Benign essential HTN 01/17/2012  . Depression with anxiety 01/17/2012  . Ovarian cyst 01/17/2012    1995  BSO  . Pneumonia 01/17/2012    10 yrs ago and 5 years ago   . Rash and other nonspecific skin eruption 01/17/2012    Dorsum hands & extensor surfaces arms; intermittent  . Cancer     ovarian  . Cancer of cervix 01/17/2012    Past Surgical History  Procedure Date  . Total hip arthroplasty 09/06/2011    Procedure: TOTAL HIP ARTHROPLASTY;  Surgeon: Loreta Ave, MD;  Location: [facility] OR;  Service: Orthopedics;  Laterality: Left;  TOTAL HIP ARTHROPLASTY LEFT SIDE  .  Abdominal hysterectomy 1984  . Ovarian cyst removal 1997  . Total hip arthroplasty 10/11/2011    Procedure: TOTAL HIP ARTHROPLASTY;  Surgeon: Loreta Ave, MD;  Location: [facility] OR;  Service: Orthopedics;  Laterality: Right;  120 MINUTES FOR THIS SURGERY  . Colonoscopy 2009    [place]-polyps, 5 yr FU    Current Outpatient Prescriptions  Medication Sig Dispense Refill  . calcium carbonate (OS-CAL) 600 MG TABS Take 600 mg by mouth 2 (two) times daily with a meal. chewable      . CHANTIX STARTING MONTH PAK 0.5 MG X 11 & 1 MG X 42 tablet Take as directed      . clonazePAM (KLONOPIN) 0.5 MG tablet Take 1 1/2 tab daily at bed time      . estradiol (ESTRACE) 1 MG tablet Take 1 mg by mouth daily.        . fish oil-omega-3 fatty acids 1000 MG capsule Take 2 g by mouth daily. chewable      . hydrochlorothiazide (HYDRODIURIL) 25 MG tablet Take 1/2 tablet daily      . Levothyroxine Sodium 112 MCG CAPS Take by mouth daily.      Marland Kitchen omeprazole (PRILOSEC) 40 MG capsule Take 40 mg by mouth daily.      Marland Kitchen venlafaxine (EFFEXOR-XR) 150 MG 24 hr capsule Take 150 mg by mouth 2 (two) times daily.  Allergies  Allergen Reactions  . Sulfa Antibiotics Rash    History   Social History  . Marital Status: Widowed    Spouse Name: N/A    Number of Children: 1  . Years of Education: N/A   Occupational History  . disabled    Social History Main Topics  . Smoking status: Light Tobacco Smoker -- 0.5 packs/day for 40 years    Types: Cigarettes  . Smokeless tobacco: Never Used     Comment: pt states that she wants to quit but needs help was advised to call 1-800-QUITNOW and to speak with PCP about avaliable aids  . Alcohol Use: No  . Drug Use: No  . Sexually Active: Not on file   Other Topics Concern  . Not on file   Social History Narrative  . No narrative on file    Family History  Problem Relation Age of Onset  . Anesthesia problems Neg Hx   . Hypotension Neg Hx   . Malignant  hyperthermia Neg Hx   . Pseudochol deficiency Neg Hx   . Breast cancer Sister   . Cancer Sister     breast  . Cancer Mother     lung  . Hypertension Son   . Heart failure Father     Review of Systems:  As stated in the HPI and otherwise negative.   BP 146/90  Pulse 64  Ht 5\' 7"  (1.702 m)  Wt 165 lb (74.844 kg)  BMI 25.84 kg/m2  Physical Examination: General: Well developed, well nourished, NAD HEENT: OP clear, mucus membranes moist SKIN: warm, dry. No rashes. Neuro: No focal deficits Musculoskeletal: Muscle strength 5/5 all ext Psychiatric: Mood and affect normal Neck: No JVD, no carotid bruits, no thyromegaly, no lymphadenopathy. Lungs:Clear bilaterally, no wheezes, rhonci, crackles Cardiovascular: Regular rate and rhythm. No murmurs, gallops or rubs. Abdomen:Soft. Bowel sounds present. Non-tender.  Extremities: No lower extremity edema. Pulses are 2 + in the bilateral DP/PT.  EKG: NSR, rate 64 bpm.   Assessment and Plan:   1. Pre-operative cardiovascular examination: She has no symptoms worrisome for angina, CHF or arrythmias however she does have multiple cardiac risk factors including HTN, ongoing tobacco abuse and personal history of PAD with mesenteric ischemia. She is relatively inactive with severe joint aches and pains. Will arrange Lexiscan stress myoview to exclude ischemia before her major vascular procedure.   2. Tobacco abuse: Smoking cessation advised. She wishes to stop.

## 2012-08-26 NOTE — Patient Instructions (Addendum)
Your physician recommends that you schedule a follow-up appointment as needed with Dr. Clifton James  Your physician has requested that you have a lexiscan myoview. For further information please visit [email or web address] Please follow instruction sheet, as given.

## 2012-09-03 ENCOUNTER — Ambulatory Visit (HOSPITAL_COMMUNITY): Payer: BC Managed Care – PPO | Attending: Cardiology | Admitting: Radiology

## 2012-09-03 VITALS — BP 123/76 | HR 54 | Ht 67.0 in | Wt 168.0 lb

## 2012-09-03 DIAGNOSIS — I10 Essential (primary) hypertension: Secondary | ICD-10-CM | POA: Insufficient documentation

## 2012-09-03 DIAGNOSIS — I739 Peripheral vascular disease, unspecified: Secondary | ICD-10-CM

## 2012-09-03 DIAGNOSIS — Z01810 Encounter for preprocedural cardiovascular examination: Secondary | ICD-10-CM

## 2012-09-03 DIAGNOSIS — F17200 Nicotine dependence, unspecified, uncomplicated: Secondary | ICD-10-CM | POA: Insufficient documentation

## 2012-09-03 MED ORDER — TECHNETIUM TC 99M SESTAMIBI GENERIC - CARDIOLITE
11.0000 | Freq: Once | INTRAVENOUS | Status: AC | PRN
  Administered 2012-09-03: 11 via INTRAVENOUS

## 2012-09-03 MED ORDER — TECHNETIUM TC 99M SESTAMIBI GENERIC - CARDIOLITE
33.0000 | Freq: Once | INTRAVENOUS | Status: AC | PRN
  Administered 2012-09-03: 33 via INTRAVENOUS

## 2012-09-03 MED ORDER — REGADENOSON 0.4 MG/5ML IV SOLN
0.4000 mg | Freq: Once | INTRAVENOUS | Status: AC
  Administered 2012-09-03: 0.4 mg via INTRAVENOUS

## 2012-09-03 NOTE — Progress Notes (Signed)
[facility] SITE 3 NUCLEAR MED [address] [id number] [place] [address] [phone]  Cardiology Nuclear Med Study  Dorothy [name] is a 56 y.o. female     MRN : [id number]     DOB: 1956/08/28  Procedure Date: 09/03/2012  Nuclear Med Background Indication for Stress Test:  Evaluation for Ischemia and Pending Clearance for Superior Mesenteric Artery Bypass by Dr. Fabienne Bruns History:  No previously documented CAD. Cardiac Risk Factors: Hypertension, PAD and Smoker  Symptoms:  No cardiac complaints.   Nuclear Pre-Procedure Caffeine/Decaff Intake:  None > 12 hrs NPO After: 7:30pm   Lungs:  Clear. O2 Sat: 96% on room air. IV 0.9% NS with Angio Cath:  20g  IV Site: R Forearm x 1, tolerated well IV Started by:  Irean Hong, RN  Chest Size (in):  38 Cup Size: C  Height: 5\' 7"  (1.702 m)  Weight:  168 lb (76.204 kg)  BMI:  Body mass index is 26.31 kg/(m^2). Tech Comments:  n/a    Nuclear Med Study 1 or 2 day study: 1 day  Stress Test Type:  Lexiscan  Reading MD: Willa Rough, MD  Order Authorizing Provider:  Verne Carrow, MD  Resting Radionuclide: Technetium 73m Sestamibi  Resting Radionuclide Dose: 11.0 mCi   Stress Radionuclide:  Technetium 10m Sestamibi  Stress Radionuclide Dose: 33.0 mCi           Stress Protocol Rest HR: 54 Stress HR: 81  Rest BP: 123/76 Stress BP: 102/77  Exercise Time (min): n/a METS: n/a   Predicted Max HR: 165 bpm % Max HR: 49.09 bpm Rate Pressure Product: 8262   Dose of Adenosine (mg):  n/a Dose of Lexiscan: 0.4 mg  Dose of Atropine (mg): n/a Dose of Dobutamine: n/a mcg/kg/min (at max HR)  Stress Test Technologist: Smiley Houseman, CMA-N  Nuclear Technologist:  Domenic Polite, CNMT     Rest Procedure:  Myocardial perfusion imaging was performed at rest 45 minutes following the intravenous administration of Technetium 75m Sestamibi.  Rest ECG: No acute changes.  Stress Procedure:  The patient received  IV Lexiscan 0.4 mg over 15-seconds.  Technetium 16m Sestamibi was injected at 30-seconds.  There were no significant changes with Lexiscan.  Quantitative spect images were obtained after a 45 minute delay.  Stress ECG: No significant ST segment change suggestive of ischemia.  QPS Raw Data Images:  Patient motion noted; appropriate software correction applied. Stress Images:  Normal homogeneous uptake in all areas of the myocardium. Rest Images:  Normal homogeneous uptake in all areas of the myocardium. Subtraction (SDS):  No evidence of ischemia. Transient Ischemic Dilatation (Normal <1.22):  1.03 Lung/Heart Ratio (Normal <0.45):  0.30  Quantitative Gated Spect Images QGS EDV:  94 ml QGS ESV:  39 ml  Impression Exercise Capacity:  Lexiscan with no exercise. BP Response:  Normal blood pressure response. Clinical Symptoms:  tachycardia ECG Impression:  No significant ST segment change suggestive of ischemia. Comparison with Prior Nuclear Study: No previous nuclear study performed  Overall Impression:  Normal stress nuclear study.  LV Ejection Fraction: 58%.  LV Wall Motion:  Normal Wall Motion.  Willa Rough, MD

## 2012-09-04 ENCOUNTER — Encounter: Payer: Self-pay | Admitting: Vascular Surgery

## 2012-09-05 ENCOUNTER — Ambulatory Visit (INDEPENDENT_AMBULATORY_CARE_PROVIDER_SITE_OTHER): Payer: BC Managed Care – PPO | Admitting: Vascular Surgery

## 2012-09-05 ENCOUNTER — Encounter: Payer: Self-pay | Admitting: Vascular Surgery

## 2012-09-05 VITALS — BP 145/91 | HR 64 | Resp 16 | Ht 61.0 in | Wt 172.0 lb

## 2012-09-05 DIAGNOSIS — K559 Vascular disorder of intestine, unspecified: Secondary | ICD-10-CM

## 2012-09-05 NOTE — Progress Notes (Signed)
History of Present Illness: Patient is a 56 y.o. year old female who presents for evaluation of abdominal pain and mesenteric ischemia. She was last seen approximately one month ago. She returns today after followup from her cardiac stress test. The patient has had a history of diffuse lower abdominal pain for approximately one year. This sometimes occurs with eating. However, it can occur randomly. She has lost 25 pounds in the last 5 months. She has also developed food fear of the last few months. She denies nausea vomiting or diarrhea. She denies any blood per rectum. She states that she does have right first that occur at the time of her onset of pain. She states that the pain can last for several hours with no relief. She has not had any interventions to improve her symptoms. She is still smoking approximately 5 cigarettes per day.  Other medical problems include hypertension, hypothyroidism, depression, multiple joint arthritis. These are all currently controlled.   Past Medical History   Diagnosis  Date   .  Constipation    .  Hypertension      takes meds   .  Hypothyroidism      on Levonthyroxine   .  Pneumonia      x 2- last time > 5 years ago   .  Depression      Takes Klonopin   .  Arthritis      hands and Knees   .  Avascular necrosis of bones of both hips  01/17/2012   .  Hypothyroidism (acquired)  01/17/2012     Since age 24   .  Benign essential HTN  01/17/2012   .  Depression with anxiety  01/17/2012   .  Ovarian cyst  01/17/2012     1995 BSO   .  Pneumonia  01/17/2012     10 yrs ago and 5 years ago   .  Rash and other nonspecific skin eruption  01/17/2012     Dorsum hands & extensor surfaces arms; intermittent   .  Cancer      ovarian   .  Cancer of cervix  01/17/2012    Past Surgical History   Procedure  Date   .  Total hip arthroplasty  09/06/2011     Procedure: TOTAL HIP ARTHROPLASTY; Surgeon: Daniel F Murphy, MD; Location: [facility] OR; Service: Orthopedics; Laterality: Left;  TOTAL HIP ARTHROPLASTY LEFT SIDE   .  Abdominal hysterectomy  1984   .  Ovarian cyst removal  1997   .  Total hip arthroplasty  10/11/2011     Procedure: TOTAL HIP ARTHROPLASTY; Surgeon: Daniel F Murphy, MD; Location: [facility] OR; Service: Orthopedics; Laterality: Right; 120 MINUTES FOR THIS SURGERY   .  Joint replacement    .  Colonoscopy  2009     [place]-polyps, 5 yr FU   Social History  History   Substance Use Topics   .  Smoking status:  Current Every Day Smoker -- 0.5 packs/day for 35 years     Types:  Cigarettes   .  Smokeless tobacco:  Never Used     Comment: pt states that she wants to quit but needs help was advised to call 1-800-QUITNOW and to speak with PCP about avaliable aids    .  Alcohol Use:  No   Family History  Family History   Problem  Relation  Age of Onset   .  Anesthesia problems  Neg Hx    .  Hypotension    Neg Hx    .  Malignant hyperthermia  Neg Hx    .  Pseudochol deficiency  Neg Hx    .  Breast cancer  Sister    .  Cancer  Sister       breast    .  Cancer  Mother       lung    .  Hypertension  Son    Allergies  Allergies   Allergen  Reactions   .  Sulfa Antibiotics  Rash    Current Outpatient Prescriptions   Medication  Sig  Dispense  Refill   .  clonazePAM (KLONOPIN) 0.5 MG tablet  Take 0.5 mg by mouth daily. Take 1 1/2 tab q day     .  estradiol (ESTRACE) 1 MG tablet  Take 1 mg by mouth daily.     .  hydrochlorothiazide (HYDRODIURIL) 25 MG tablet  Take 25 mg by mouth daily. Take 1/2 tablet per day     .  Levothyroxine Sodium 112 MCG CAPS  Take by mouth daily.     .  omeprazole (PRILOSEC) 20 MG capsule  Take 20 mg by mouth daily.     .  calcium carbonate (OS-CAL) 600 MG TABS  Take 600 mg by mouth 2 (two) times daily with a meal. chewable     .  fish oil-omega-3 fatty acids 1000 MG capsule  Take 2 g by mouth daily. chewable     .  Multiple Vitamins-Minerals (MULTIVITAMIN WITH MINERALS) tablet  Take 1 tablet by mouth daily. Takes  chewable--gummies     .  venlafaxine (EFFEXOR-XR) 150 MG 24 hr capsule  Take 150 mg by mouth 2 (two) times daily.      ROS:  General: + weight loss, Fever, + chills  HEENT: No recent headaches, no nasal bleeding, no visual changes, no sore throat  Neurologic: No dizziness, blackouts, seizures. No recent symptoms of stroke or mini- stroke. No recent episodes of slurred speech, or temporary blindness.  Cardiac: No recent episodes of chest pain/pressure, no shortness of breath at rest. No shortness of breath with exertion. Denies history of atrial fibrillation or irregular heartbeat  Vascular: No history of rest pain in feet. No history of claudication. No history of non-healing ulcer, No history of DVT  Pulmonary: No home oxygen, no productive cough, no hemoptysis, No asthma or wheezing  Musculoskeletal: [x ] Arthritis, [ ] Low back pain, [x ] Joint pain  Hematologic:No history of hypercoagulable state. No history of easy bleeding. No history of anemia  Gastrointestinal: No hematochezia or melena, No gastroesophageal reflux, no trouble swallowing  Urinary: [ ] chronic Kidney disease, [ ] on HD - [ ] MWF or [ ] TTHS, [ ] Burning with urination, [ ] Frequent urination, [ ] Difficulty urinating;  Skin: No rashes  Psychological: + history of anxiety, + history of depression  Physical Examination   Filed Vitals:   09/05/12 1105  BP: 145/91  Pulse: 64  Resp: 16  Height: 5' 1" (1.549 m)  Weight: 172 lb (78.019 kg)  SpO2: 99%  General: Alert and oriented, no acute distress  HEENT: Normal  Neck: No bruit or JVD  Pulmonary: Clear to auscultation bilaterally  Cardiac: Regular Rate and Rhythm without murmur  Abdomen: Soft, non-tender, non-distended, no mass, well-healed lower midline scars, no bruit  Skin: No rash  Extremity Pulses: 2+ radial, brachial, 2+ left femoral, 1+ right femoral absent popliteal dorsalis pedis, posterior tibial pulses bilaterally  Musculoskeletal: No   deformity or edema   Neurologic: Upper and lower extremity motor 5/5 and symmetric   DATA: CT angiogram images were reviewed today shows a 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 She also has a 2-3 segment occlusion of the proximal superior mesenteric artery. The inferior mesenteric artery is diminutive. There was no evidence of abdominal aneurysm. The renal arteries were patent.   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   PLAN: Aorto to superior mesenteric and celiac artery bypass scheduled for 09/25/2012. Risks benefits possible complications and procedure details were discussed with the patient today. These include but are not limited to bleeding infection myocardial events recurrent gut ischemia or bowel infarction possible risk for transfusion.  Smoking cessation was again discussed with the patient and also it was discussed with her that durability could be limited if she continues to smoke  Charles Fields, MD  [facility] [place]  Office: [phone]  Pager: [phone]  

## 2012-09-09 ENCOUNTER — Other Ambulatory Visit: Payer: Self-pay | Admitting: *Deleted

## 2012-09-13 ENCOUNTER — Encounter (HOSPITAL_COMMUNITY): Payer: Self-pay

## 2012-09-13 ENCOUNTER — Ambulatory Visit: Payer: BC Managed Care – PPO | Admitting: Internal Medicine

## 2012-09-13 ENCOUNTER — Encounter (HOSPITAL_COMMUNITY): Payer: Self-pay | Admitting: Pharmacy Technician

## 2012-09-13 ENCOUNTER — Encounter (HOSPITAL_COMMUNITY)
Admission: RE | Admit: 2012-09-13 | Discharge: 2012-09-13 | Disposition: A | Discharge status: Home / Self Care | Payer: BC Managed Care – PPO | Source: Ambulatory Visit | Attending: Vascular Surgery | Admitting: Vascular Surgery

## 2012-09-13 ENCOUNTER — Ambulatory Visit (HOSPITAL_COMMUNITY)
Admission: RE | Admit: 2012-09-13 | Discharge: 2012-09-13 | Disposition: A | Discharge status: Home / Self Care | Payer: BC Managed Care – PPO | Source: Ambulatory Visit | Attending: Vascular Surgery | Admitting: Vascular Surgery

## 2012-09-13 DIAGNOSIS — F17200 Nicotine dependence, unspecified, uncomplicated: Secondary | ICD-10-CM | POA: Insufficient documentation

## 2012-09-13 DIAGNOSIS — I10 Essential (primary) hypertension: Secondary | ICD-10-CM | POA: Insufficient documentation

## 2012-09-13 LAB — COMPREHENSIVE METABOLIC PANEL
Albumin: 3.8 g/dL (ref 3.5–5.2)
Alkaline Phosphatase: 84 U/L (ref 39–117)
BUN: 11 mg/dL (ref 6–23)
Chloride: 102 mEq/L (ref 96–112)
Glucose, Bld: 95 mg/dL (ref 70–99)
Potassium: 4.4 mEq/L (ref 3.5–5.1)
Total Bilirubin: 0.2 mg/dL — ABNORMAL LOW (ref 0.3–1.2)

## 2012-09-13 LAB — CBC
HCT: 41.5 % (ref 36.0–46.0)
Hemoglobin: 15.1 g/dL — ABNORMAL HIGH (ref 12.0–15.0)
RDW: 14.3 % (ref 11.5–15.5)
WBC: 10.2 10*3/uL (ref 4.0–10.5)

## 2012-09-13 LAB — TYPE AND SCREEN: ABO/RH(D): A POS

## 2012-09-13 LAB — URINALYSIS, ROUTINE W REFLEX MICROSCOPIC
Glucose, UA: NEGATIVE mg/dL
Hgb urine dipstick: NEGATIVE
Protein, ur: NEGATIVE mg/dL
pH: 6.5 (ref 5.0–8.0)

## 2012-09-13 LAB — URINE MICROSCOPIC-ADD ON

## 2012-09-13 LAB — BLOOD GAS, ARTERIAL
Bicarbonate: 22.3 mEq/L (ref 20.0–24.0)
TCO2: 23.1 mmol/L (ref 0–100)
pCO2 arterial: 26.4 mmHg — ABNORMAL LOW (ref 35.0–45.0)
pH, Arterial: 7.537 — ABNORMAL HIGH (ref 7.350–7.450)

## 2012-09-13 LAB — SURGICAL PCR SCREEN: MRSA, PCR: NEGATIVE

## 2012-09-13 NOTE — Progress Notes (Signed)
Dr Clifton James called for stress test

## 2012-09-13 NOTE — Pre-Procedure Instructions (Signed)
[address] Anona JAELYNN POZO  09/13/2012   Your procedure is scheduled on:  09/25/12  Report to [facility] at 630 AM.  Call this number if you have problems the morning of surgery: [phone]   Remember:   Do not eat food:After Midnight.    Take these medicines the morning of surgery with A SIP OF WATER: estrace,prilosec,effexor   Do not wear jewelry, make-up or nail polish.  Do not wear lotions, powders, or perfumes. You may wear deodorant.  Do not shave 48 hours prior to surgery. Men may shave face and neck.  Do not bring valuables to the hospital.  Contacts, dentures or bridgework may not be worn into surgery.  Leave suitcase in the car. After surgery it may be brought to your room.  For patients admitted to the hospital, checkout time is 11:00 AM the day of discharge.   Patients discharged the day of surgery will not be allowed to drive home.  Name and phone number of your driver: familty  Special Instructions: Shower using CHG 2 nights before surgery and the night before surgery.  If you shower the day of surgery use CHG.  Use special wash - you have one bottle of CHG for all showers.  You should use approximately 1/3 of the bottle for each shower.   Please read over the following fact sheets that you were given: Pain Booklet, Coughing and Deep Breathing, Blood Transfusion Information, MRSA Information and Surgical Site Infection Prevention

## 2012-09-16 ENCOUNTER — Other Ambulatory Visit: Payer: Self-pay | Admitting: *Deleted

## 2012-09-24 MED ORDER — SODIUM CHLORIDE 0.9 % IV SOLN: INTRAVENOUS | Status: DC

## 2012-09-24 MED ORDER — DEXTROSE 5 % IV SOLN
1.5000 g | INTRAVENOUS | Status: AC
  Administered 2012-09-25: 1.5 g via INTRAVENOUS
  Administered 2012-09-25: .75 g via INTRAVENOUS
  Filled 2012-09-24: qty 1.5

## 2012-09-25 ENCOUNTER — Inpatient Hospital Stay (HOSPITAL_COMMUNITY): Payer: BC Managed Care – PPO

## 2012-09-25 ENCOUNTER — Ambulatory Visit (HOSPITAL_COMMUNITY): Payer: BC Managed Care – PPO

## 2012-09-25 ENCOUNTER — Encounter (HOSPITAL_COMMUNITY): Payer: Self-pay | Admitting: Anesthesiology

## 2012-09-25 ENCOUNTER — Inpatient Hospital Stay (HOSPITAL_COMMUNITY)
Admission: RE | Admit: 2012-09-25 | Discharge: 2012-10-03 | DRG: 553 | Disposition: A | Discharge status: Home / Self Care | Payer: BC Managed Care – PPO | Source: Ambulatory Visit | Attending: Vascular Surgery | Admitting: Vascular Surgery

## 2012-09-25 ENCOUNTER — Encounter (HOSPITAL_COMMUNITY)
Admission: RE | Disposition: A | Discharge status: Home / Self Care | Payer: Self-pay | Source: Ambulatory Visit | Attending: Vascular Surgery

## 2012-09-25 ENCOUNTER — Encounter (HOSPITAL_COMMUNITY): Payer: Self-pay | Admitting: *Deleted

## 2012-09-25 ENCOUNTER — Ambulatory Visit (HOSPITAL_COMMUNITY): Payer: BC Managed Care – PPO | Admitting: Anesthesiology

## 2012-09-25 DIAGNOSIS — J9601 Acute respiratory failure with hypoxia: Secondary | ICD-10-CM

## 2012-09-25 DIAGNOSIS — IMO0002 Reserved for concepts with insufficient information to code with codable children: Secondary | ICD-10-CM | POA: Diagnosis not present

## 2012-09-25 DIAGNOSIS — K560 Paralytic ileus: Secondary | ICD-10-CM | POA: Diagnosis not present

## 2012-09-25 DIAGNOSIS — Z79899 Other long term (current) drug therapy: Secondary | ICD-10-CM

## 2012-09-25 DIAGNOSIS — F3289 Other specified depressive episodes: Secondary | ICD-10-CM | POA: Diagnosis present

## 2012-09-25 DIAGNOSIS — E039 Hypothyroidism, unspecified: Secondary | ICD-10-CM | POA: Diagnosis present

## 2012-09-25 DIAGNOSIS — Y832 Surgical operation with anastomosis, bypass or graft as the cause of abnormal reaction of the patient, or of later complication, without mention of misadventure at the time of the procedure: Secondary | ICD-10-CM | POA: Diagnosis not present

## 2012-09-25 DIAGNOSIS — R197 Diarrhea, unspecified: Secondary | ICD-10-CM | POA: Diagnosis not present

## 2012-09-25 DIAGNOSIS — J9600 Acute respiratory failure, unspecified whether with hypoxia or hypercapnia: Secondary | ICD-10-CM

## 2012-09-25 DIAGNOSIS — D696 Thrombocytopenia, unspecified: Secondary | ICD-10-CM | POA: Diagnosis not present

## 2012-09-25 DIAGNOSIS — E872 Acidosis, unspecified: Secondary | ICD-10-CM | POA: Diagnosis present

## 2012-09-25 DIAGNOSIS — K929 Disease of digestive system, unspecified: Secondary | ICD-10-CM | POA: Diagnosis not present

## 2012-09-25 DIAGNOSIS — M19049 Primary osteoarthritis, unspecified hand: Secondary | ICD-10-CM | POA: Diagnosis present

## 2012-09-25 DIAGNOSIS — K551 Chronic vascular disorders of intestine: Secondary | ICD-10-CM

## 2012-09-25 DIAGNOSIS — R634 Abnormal weight loss: Secondary | ICD-10-CM | POA: Diagnosis present

## 2012-09-25 DIAGNOSIS — E876 Hypokalemia: Secondary | ICD-10-CM | POA: Diagnosis not present

## 2012-09-25 DIAGNOSIS — Z96649 Presence of unspecified artificial hip joint: Secondary | ICD-10-CM

## 2012-09-25 DIAGNOSIS — Z6829 Body mass index (BMI) 29.0-29.9, adult: Secondary | ICD-10-CM

## 2012-09-25 DIAGNOSIS — M1710 Unilateral primary osteoarthritis, unspecified knee: Secondary | ICD-10-CM | POA: Diagnosis present

## 2012-09-25 DIAGNOSIS — F17200 Nicotine dependence, unspecified, uncomplicated: Secondary | ICD-10-CM | POA: Diagnosis present

## 2012-09-25 DIAGNOSIS — I739 Peripheral vascular disease, unspecified: Secondary | ICD-10-CM | POA: Diagnosis present

## 2012-09-25 DIAGNOSIS — Z8543 Personal history of malignant neoplasm of ovary: Secondary | ICD-10-CM

## 2012-09-25 DIAGNOSIS — J95821 Acute postprocedural respiratory failure: Secondary | ICD-10-CM | POA: Diagnosis not present

## 2012-09-25 DIAGNOSIS — K559 Vascular disorder of intestine, unspecified: Secondary | ICD-10-CM | POA: Diagnosis present

## 2012-09-25 DIAGNOSIS — D62 Acute posthemorrhagic anemia: Secondary | ICD-10-CM | POA: Diagnosis not present

## 2012-09-25 DIAGNOSIS — E871 Hypo-osmolality and hyponatremia: Secondary | ICD-10-CM | POA: Diagnosis not present

## 2012-09-25 DIAGNOSIS — I10 Essential (primary) hypertension: Secondary | ICD-10-CM | POA: Diagnosis present

## 2012-09-25 DIAGNOSIS — F329 Major depressive disorder, single episode, unspecified: Secondary | ICD-10-CM | POA: Diagnosis present

## 2012-09-25 HISTORY — PX: MESENTERIC ARTERY BYPASS: SHX5968

## 2012-09-25 HISTORY — PX: CELIAC ARTERY BYPASS: SHX5768

## 2012-09-25 LAB — PROTIME-INR
Prothrombin Time: 12.6 seconds (ref 11.6–15.2)
Prothrombin Time: 15.9 seconds — ABNORMAL HIGH (ref 11.6–15.2)

## 2012-09-25 LAB — APTT
aPTT: 29 seconds (ref 24–37)
aPTT: >200 seconds (ref 24–37)

## 2012-09-25 LAB — CBC
HCT: 35.5 % — ABNORMAL LOW (ref 36.0–46.0)
Hemoglobin: 12.3 g/dL (ref 12.0–15.0)
MCH: 28 pg (ref 26.0–34.0)
MCV: 80.9 fL (ref 78.0–100.0)
RBC: 4.39 MIL/uL (ref 3.87–5.11)

## 2012-09-25 LAB — BASIC METABOLIC PANEL
BUN: 11 mg/dL (ref 6–23)
CO2: 24 mEq/L (ref 19–32)
Calcium: 7.7 mg/dL — ABNORMAL LOW (ref 8.4–10.5)
Creatinine, Ser: 0.7 mg/dL (ref 0.50–1.10)

## 2012-09-25 LAB — POCT I-STAT 3, ART BLOOD GAS (G3+)
O2 Saturation: 95 %
TCO2: 25 mmol/L (ref 0–100)
pCO2 arterial: 51 mmHg — ABNORMAL HIGH (ref 35.0–45.0)

## 2012-09-25 LAB — BLOOD GAS, ARTERIAL
Acid-base deficit: 3.9 mmol/L — ABNORMAL HIGH (ref 0.0–2.0)
Bicarbonate: 22.3 mEq/L (ref 20.0–24.0)
TCO2: 24 mmol/L (ref 0–100)
pCO2 arterial: 50.9 mmHg — ABNORMAL HIGH (ref 35.0–45.0)
pH, Arterial: 7.259 — ABNORMAL LOW (ref 7.350–7.450)

## 2012-09-25 SURGERY — CREATION, BYPASS, ARTERIAL, MESENTERIC
Anesthesia: General | Site: Abdomen

## 2012-09-25 MED ORDER — ALBUMIN HUMAN 5 % IV SOLN
INTRAVENOUS | Status: DC | PRN
  Administered 2012-09-25: 13:00:00 via INTRAVENOUS

## 2012-09-25 MED ORDER — NITROGLYCERIN IN D5W 200-5 MCG/ML-% IV SOLN
INTRAVENOUS | Status: DC | PRN
  Administered 2012-09-25: 16.6 ug/min via INTRAVENOUS

## 2012-09-25 MED ORDER — OXYCODONE HCL 5 MG/5ML PO SOLN: 5.0000 mg | Freq: Once | ORAL | Status: DC | PRN

## 2012-09-25 MED ORDER — IPRATROPIUM BROMIDE 0.02 % IN SOLN: 0.5000 mg | RESPIRATORY_TRACT | Status: DC | PRN

## 2012-09-25 MED ORDER — MAGNESIUM SULFATE 40 MG/ML IJ SOLN
2.0000 g | Freq: Every day | INTRAMUSCULAR | Status: DC | PRN
  Administered 2012-09-27: 2 g via INTRAVENOUS
  Filled 2012-09-25 (×2): qty 50

## 2012-09-25 MED ORDER — NEOSTIGMINE METHYLSULFATE 1 MG/ML IJ SOLN
INTRAMUSCULAR | Status: DC | PRN
  Administered 2012-09-25: 4 mg via INTRAVENOUS

## 2012-09-25 MED ORDER — ONDANSETRON HCL 4 MG/2ML IJ SOLN: 4.0000 mg | Freq: Four times a day (QID) | INTRAMUSCULAR | Status: DC | PRN

## 2012-09-25 MED ORDER — PANTOPRAZOLE SODIUM 40 MG IV SOLR
40.0000 mg | Freq: Every day | INTRAVENOUS | Status: DC
  Administered 2012-09-25: 40 mg via INTRAVENOUS
  Filled 2012-09-25 (×2): qty 40

## 2012-09-25 MED ORDER — MIDAZOLAM HCL 5 MG/5ML IJ SOLN
INTRAMUSCULAR | Status: DC | PRN
  Administered 2012-09-25: 2 mg via INTRAVENOUS

## 2012-09-25 MED ORDER — HYDROMORPHONE HCL PF 1 MG/ML IJ SOLN
INTRAMUSCULAR | Status: DC | PRN
  Administered 2012-09-25 (×2): 0.5 mg via INTRAVENOUS

## 2012-09-25 MED ORDER — KCL IN DEXTROSE-NACL 20-5-0.9 MEQ/L-%-% IV SOLN
INTRAVENOUS | Status: DC
  Administered 2012-09-25: 125 mL/h via INTRAVENOUS
  Administered 2012-09-26: 05:00:00 via INTRAVENOUS
  Filled 2012-09-25 (×4): qty 1000

## 2012-09-25 MED ORDER — PROPOFOL 10 MG/ML IV BOLUS
INTRAVENOUS | Status: DC | PRN
  Administered 2012-09-25: 170 mg via INTRAVENOUS

## 2012-09-25 MED ORDER — HYDRALAZINE HCL 20 MG/ML IJ SOLN
10.0000 mg | INTRAMUSCULAR | Status: DC | PRN
  Filled 2012-09-25: qty 0.5

## 2012-09-25 MED ORDER — HYDROMORPHONE HCL PF 1 MG/ML IJ SOLN
0.2500 mg | INTRAMUSCULAR | Status: DC | PRN
  Administered 2012-09-25 (×2): 0.5 mg via INTRAVENOUS

## 2012-09-25 MED ORDER — ONDANSETRON HCL 4 MG/2ML IJ SOLN
INTRAMUSCULAR | Status: DC | PRN
  Administered 2012-09-25: 4 mg via INTRAVENOUS

## 2012-09-25 MED ORDER — ACETAMINOPHEN 325 MG RE SUPP
325.0000 mg | RECTAL | Status: DC | PRN
  Filled 2012-09-25: qty 2

## 2012-09-25 MED ORDER — GLYCOPYRROLATE 0.2 MG/ML IJ SOLN
INTRAMUSCULAR | Status: DC | PRN
  Administered 2012-09-25: .5 mg via INTRAVENOUS

## 2012-09-25 MED ORDER — ACETAMINOPHEN 325 MG PO TABS: 325.0000 mg | ORAL_TABLET | ORAL | Status: DC | PRN

## 2012-09-25 MED ORDER — SODIUM CHLORIDE 0.9 % IR SOLN
Status: DC | PRN
  Administered 2012-09-25 (×3): 1000 mL

## 2012-09-25 MED ORDER — DOPAMINE-DEXTROSE 3.2-5 MG/ML-% IV SOLN
3.0000 ug/kg/min | INTRAVENOUS | Status: DC | PRN
  Filled 2012-09-25: qty 250

## 2012-09-25 MED ORDER — DEXTROSE 5 % IV SOLN
1.5000 g | Freq: Two times a day (BID) | INTRAVENOUS | Status: AC
  Administered 2012-09-26 (×2): 1.5 g via INTRAVENOUS
  Filled 2012-09-25 (×2): qty 1.5

## 2012-09-25 MED ORDER — LIDOCAINE HCL (CARDIAC) 20 MG/ML IV SOLN
INTRAVENOUS | Status: DC | PRN
  Administered 2012-09-25: 100 mg via INTRAVENOUS

## 2012-09-25 MED ORDER — PHENOL 1.4 % MT LIQD: 1.0000 | OROMUCOSAL | Status: DC | PRN

## 2012-09-25 MED ORDER — POTASSIUM CHLORIDE CRYS ER 20 MEQ PO TBCR: 20.0000 meq | EXTENDED_RELEASE_TABLET | Freq: Every day | ORAL | Status: DC | PRN

## 2012-09-25 MED ORDER — MANNITOL 25 % IV SOLN: INTRAVENOUS | Status: DC | PRN

## 2012-09-25 MED ORDER — SODIUM CHLORIDE 0.9 % IR SOLN
Status: DC | PRN
  Administered 2012-09-25: 10:00:00

## 2012-09-25 MED ORDER — PHENYLEPHRINE HCL 10 MG/ML IJ SOLN
INTRAMUSCULAR | Status: DC | PRN
  Administered 2012-09-25 (×5): 80 ug via INTRAVENOUS

## 2012-09-25 MED ORDER — HEPARIN SODIUM (PORCINE) 1000 UNIT/ML IJ SOLN
INTRAMUSCULAR | Status: DC | PRN
  Administered 2012-09-25: 8 mL via INTRAVENOUS
  Administered 2012-09-25: 10 mL via INTRAVENOUS

## 2012-09-25 MED ORDER — LEVOTHYROXINE SODIUM 100 MCG IV SOLR
56.0000 ug | Freq: Every day | INTRAVENOUS | Status: DC
  Administered 2012-09-25 – 2012-09-29 (×5): 56 ug via INTRAVENOUS
  Filled 2012-09-25 (×7): qty 2.8

## 2012-09-25 MED ORDER — OXYCODONE HCL 5 MG PO TABS: 5.0000 mg | ORAL_TABLET | Freq: Once | ORAL | Status: DC | PRN

## 2012-09-25 MED ORDER — ALBUTEROL SULFATE (5 MG/ML) 0.5% IN NEBU: 2.5000 mg | INHALATION_SOLUTION | RESPIRATORY_TRACT | Status: DC | PRN

## 2012-09-25 MED ORDER — LACTATED RINGERS IV SOLN
INTRAVENOUS | Status: DC | PRN
  Administered 2012-09-25 (×2): via INTRAVENOUS

## 2012-09-25 MED ORDER — EPHEDRINE SULFATE 50 MG/ML IJ SOLN
INTRAMUSCULAR | Status: DC | PRN
  Administered 2012-09-25: 10 mg via INTRAVENOUS
  Administered 2012-09-25: 25 mg via INTRAVENOUS
  Administered 2012-09-25: 10 mg via INTRAVENOUS

## 2012-09-25 MED ORDER — HYDROMORPHONE HCL PF 1 MG/ML IJ SOLN
INTRAMUSCULAR | Status: AC
  Filled 2012-09-25: qty 1

## 2012-09-25 MED ORDER — MORPHINE SULFATE 2 MG/ML IJ SOLN
2.0000 mg | INTRAMUSCULAR | Status: DC | PRN
  Administered 2012-09-25 – 2012-09-26 (×5): 2 mg via INTRAVENOUS
  Filled 2012-09-25 (×6): qty 1

## 2012-09-25 MED ORDER — MANNITOL 25 % IV SOLN
12.5000 g | INTRAVENOUS | Status: AC
  Administered 2012-09-25: 12.5 g via INTRAVENOUS
  Filled 2012-09-25: qty 50

## 2012-09-25 MED ORDER — LABETALOL HCL 5 MG/ML IV SOLN
INTRAVENOUS | Status: DC | PRN
  Administered 2012-09-25 (×2): 10 mg via INTRAVENOUS

## 2012-09-25 MED ORDER — FENTANYL CITRATE 0.05 MG/ML IJ SOLN
INTRAMUSCULAR | Status: DC | PRN
  Administered 2012-09-25 (×7): 50 ug via INTRAVENOUS
  Administered 2012-09-25: 100 ug via INTRAVENOUS
  Administered 2012-09-25: 50 ug via INTRAVENOUS
  Administered 2012-09-25 (×2): 100 ug via INTRAVENOUS
  Administered 2012-09-25 (×5): 50 ug via INTRAVENOUS

## 2012-09-25 MED ORDER — DOPAMINE-DEXTROSE 3.2-5 MG/ML-% IV SOLN: 3.0000 ug/kg/min | INTRAVENOUS | Status: DC | PRN

## 2012-09-25 MED ORDER — CEFUROXIME SODIUM 750 MG IJ SOLR
INTRAMUSCULAR | Status: AC
  Filled 2012-09-25: qty 750

## 2012-09-25 MED ORDER — GUAIFENESIN-DM 100-10 MG/5ML PO SYRP: 15.0000 mL | ORAL_SOLUTION | ORAL | Status: DC | PRN

## 2012-09-25 MED ORDER — SODIUM CHLORIDE 0.9 % IV SOLN: 500.0000 mL | Freq: Once | INTRAVENOUS | Status: AC | PRN

## 2012-09-25 MED ORDER — METOPROLOL TARTRATE 1 MG/ML IV SOLN
5.0000 mg | Freq: Four times a day (QID) | INTRAVENOUS | Status: DC
  Administered 2012-09-25 – 2012-09-26 (×2): 5 mg via INTRAVENOUS
  Administered 2012-09-27: 2.5 mg via INTRAVENOUS
  Administered 2012-09-28 – 2012-09-30 (×7): 5 mg via INTRAVENOUS
  Filled 2012-09-25 (×21): qty 5

## 2012-09-25 MED ORDER — LABETALOL HCL 5 MG/ML IV SOLN
10.0000 mg | INTRAVENOUS | Status: DC | PRN
  Filled 2012-09-25: qty 4

## 2012-09-25 MED ORDER — ROCURONIUM BROMIDE 100 MG/10ML IV SOLN
INTRAVENOUS | Status: DC | PRN
  Administered 2012-09-25: 40 mg via INTRAVENOUS
  Administered 2012-09-25: 50 mg via INTRAVENOUS
  Administered 2012-09-25: 60 mg via INTRAVENOUS

## 2012-09-25 MED ORDER — METOPROLOL TARTRATE 1 MG/ML IV SOLN
2.0000 mg | INTRAVENOUS | Status: DC | PRN
  Filled 2012-09-25: qty 5

## 2012-09-25 MED ORDER — ARTIFICIAL TEARS OP OINT
TOPICAL_OINTMENT | OPHTHALMIC | Status: DC | PRN
  Administered 2012-09-25: 1 via OPHTHALMIC

## 2012-09-25 MED ORDER — VECURONIUM BROMIDE 10 MG IV SOLR
INTRAVENOUS | Status: DC | PRN
  Administered 2012-09-25: 2 mg via INTRAVENOUS
  Administered 2012-09-25 (×3): 5 mg via INTRAVENOUS

## 2012-09-25 MED ORDER — NITROGLYCERIN IN D5W 200-5 MCG/ML-% IV SOLN
2.0000 ug/min | INTRAVENOUS | Status: DC
  Filled 2012-09-25: qty 250

## 2012-09-25 MED ORDER — PROMETHAZINE HCL 25 MG/ML IJ SOLN: 6.2500 mg | INTRAMUSCULAR | Status: DC | PRN

## 2012-09-25 SURGICAL SUPPLY — 65 items
ATTRACTOMAT 16X20 MAGNETIC DRP (DRAPES) ×2 IMPLANT
BAG ISOLATION DRAPE 18X18 (DRAPES) ×4 IMPLANT
CANISTER SUCTION 2500CC (MISCELLANEOUS) ×2 IMPLANT
CANNULA VESSEL W/WING WO/VALVE (CANNULA) ×2 IMPLANT
CLIP TI MEDIUM 24 (CLIP) ×2 IMPLANT
CLIP TI WIDE RED SMALL 24 (CLIP) ×2 IMPLANT
CLOTH BEACON ORANGE TIMEOUT ST (SAFETY) ×2 IMPLANT
COVER SURGICAL LIGHT HANDLE (MISCELLANEOUS) ×4 IMPLANT
COVER TABLE BACK 60X90 (DRAPES) ×2 IMPLANT
DRAPE CARDIOVASCULAR INCISE (DRAPES) ×1
DRAPE INCISE IOBAN 66X45 STRL (DRAPES) ×4 IMPLANT
DRAPE ISOLATION BAG 18X18 (DRAPES) ×4
DRAPE SRG 135X102X78XABS (DRAPES) ×1 IMPLANT
DRAPE WARM FLUID 44X44 (DRAPE) ×2 IMPLANT
DRSG COVADERM 4X10 (GAUZE/BANDAGES/DRESSINGS) ×2 IMPLANT
DRSG COVADERM 4X14 (GAUZE/BANDAGES/DRESSINGS) ×2 IMPLANT
ELECT BLADE 6.5 EXT (BLADE) ×2 IMPLANT
ELECT REM PT RETURN 9FT ADLT (ELECTROSURGICAL) ×2
ELECTRODE REM PT RTRN 9FT ADLT (ELECTROSURGICAL) ×1 IMPLANT
FELT TEFLON 4 X1 (Mesh General) ×2 IMPLANT
GLOVE BIO SURGEON STRL SZ 6.5 (GLOVE) ×16 IMPLANT
GLOVE BIO SURGEON STRL SZ7.5 (GLOVE) ×4 IMPLANT
GLOVE BIOGEL M STER SZ 6 (GLOVE) ×2 IMPLANT
GLOVE BIOGEL PI IND STRL 6.5 (GLOVE) ×5 IMPLANT
GLOVE BIOGEL PI IND STRL 7.0 (GLOVE) ×6 IMPLANT
GLOVE BIOGEL PI INDICATOR 6.5 (GLOVE) ×5
GLOVE BIOGEL PI INDICATOR 7.0 (GLOVE) ×6
GLOVE ECLIPSE 6.5 STRL STRAW (GLOVE) ×10 IMPLANT
GLOVE SURG SS PI 6.5 STRL IVOR (GLOVE) ×4 IMPLANT
GOWN PREVENTION PLUS XLARGE (GOWN DISPOSABLE) ×4 IMPLANT
GOWN PREVENTION PLUS XXLARGE (GOWN DISPOSABLE) ×2 IMPLANT
GOWN STRL NON-REIN LRG LVL3 (GOWN DISPOSABLE) ×18 IMPLANT
GRAFT VASCULAR 12X6X40 ×2 IMPLANT
INSERT FOGARTY 61MM (MISCELLANEOUS) ×2 IMPLANT
INSERT FOGARTY SM (MISCELLANEOUS) ×6 IMPLANT
KIT BASIN OR (CUSTOM PROCEDURE TRAY) ×2 IMPLANT
KIT ROOM TURNOVER OR (KITS) ×2 IMPLANT
LOOP VESSEL MINI RED (MISCELLANEOUS) ×6 IMPLANT
MARKER SKIN DUAL TIP RULER LAB (MISCELLANEOUS) ×2 IMPLANT
NS IRRIG 1000ML POUR BTL (IV SOLUTION) ×6 IMPLANT
PACK AORTA (CUSTOM PROCEDURE TRAY) ×2 IMPLANT
PAD ARMBOARD 7.5X6 YLW CONV (MISCELLANEOUS) ×4 IMPLANT
PENCIL BUTTON HOLSTER BLD 10FT (ELECTRODE) ×2 IMPLANT
RETAINER VISCERA MED (MISCELLANEOUS) ×2 IMPLANT
SLEEVE SURGEON STRL (DRAPES) ×2 IMPLANT
SPECIMEN JAR MEDIUM (MISCELLANEOUS) IMPLANT
SPONGE GAUZE 4X4 12PLY (GAUZE/BANDAGES/DRESSINGS) ×6 IMPLANT
SPONGE LAP 18X18 X RAY DECT (DISPOSABLE) IMPLANT
SPONGE SURGIFOAM ABS GEL 100 (HEMOSTASIS) IMPLANT
STAPLER VISISTAT 35W (STAPLE) ×2 IMPLANT
SUT PDS AB 1 TP1 54 (SUTURE) ×4 IMPLANT
SUT PROLENE 3 0 SH DA (SUTURE) IMPLANT
SUT PROLENE 3 0 SH1 36 (SUTURE) ×12 IMPLANT
SUT PROLENE 5 0 CC 1 (SUTURE) IMPLANT
SUT PROLENE 6 0 C 1 30 (SUTURE) ×2 IMPLANT
SUT PROLENE 6 0 CC (SUTURE) ×6 IMPLANT
SUT PROLENE 7 0 BV 1 (SUTURE) ×6 IMPLANT
SUT SILK 2 0SH CR/8 30 (SUTURE) ×2 IMPLANT
SUT VIC AB 2-0 CT1 36 (SUTURE) ×4 IMPLANT
SUT VIC AB 3-0 SH 27 (SUTURE) ×1
SUT VIC AB 3-0 SH 27X BRD (SUTURE) ×1 IMPLANT
TOWEL OR 17X24 6PK STRL BLUE (TOWEL DISPOSABLE) ×6 IMPLANT
TOWEL OR 17X26 10 PK STRL BLUE (TOWEL DISPOSABLE) ×8 IMPLANT
TRAY FOLEY CATH 14FRSI W/METER (CATHETERS) ×2 IMPLANT
WATER STERILE IRR 1000ML POUR (IV SOLUTION) ×2 IMPLANT

## 2012-09-25 NOTE — Op Note (Signed)
Procedure: Supraceliac aorta to celiac and superior mesenteric artery bypass  Preoperative diagnosis: Chronic mesenteric ischemia  Postoperative diagnosis: Same  Anesthesia: Gen.  Assistant: Newton Pigg PA-C, Della Goo PA-C   Operative findings: 12 x 6 mm Dacron graft end to side to supraceliac aorta, end-to-side to celiac confluens, end-to-side to superior mesenteric artery  Operative details: After obtaining informed consent, the patient was taken the operating room. The patient was placed in supine position operating table. After induction of general anesthesia and endotracheal intubation, the patient was prepped and draped in the usual sterile fashion from the nipples to the toes. A foley catheter was placed. A nasogastric tube was placed. Next the midline laparotomy incision was performed extending from the xiphoid to just below the umbilicus. Incision was carried through the subcutaneous tissues down to level of fascia. The fascia was incised for the full length of the incision. The small bowel was inspected and found to be viable. The stomach was viable.There were numerous omental adhesions to the abdominal wall.  These were taken down with cautery.  This also required extension of the laparotomy almost to the pubis to free up the omentum.   The NG tube was palpated and found to be in the fundus of the stomach. The left lateral segment of the liver was then taken down with cautery. Handheld Deaver retractors were used to assist in exposure.  Dissection was then carried down to the level of the crura of the diaphragm. The left crus of the diaphragm was taken down with cautery. The supraceliac aorta was palpated and found to be soft. This was dissected free circumferentially. An umbilical tape was placed around this.   I then proceeded to dissect out the celiac artery along the anterior wall of the aorta. There was a very thickened median arcuate ligament and this was divided with  cautery. The main trunk branches were also dissected free circumferentially and vessel loops were placed around these.   Attention was then turned to the superior mesenteric artery. An omni retractor was brought up in the operative field to assist with retraction.  The small bowel was reflected to the right. The colon was reflected superiorly. The retroperitoneum was entered and the retrograde duodenal attachments were taken down with cautery dissection to reflect the duodenum to the patient's right. The Superior mesenteric artery was then located and dissected free circumferentially. The more proximal section of the superior mesenteric artery was very thickened and nonpulsatile. Preoperative imaging had shown that the proximal superior mesenteric artery was severely diseased. Therefore I dissected several centimeters down the superior mesenteric artery and approximately 10 branches were dissected free circumferentially and controlled with vessel loops. The artery had a visible lumen at this location and was soft in character. Next a tunnel was created adjacent to the superior mesenteric artery retrocolic retropancreatic up to the level of the celiac artery posterior to the splenic and left gastric artery. An umbilical tape was placed in this tunnel.   The patient was then given 10000 units of intravenous heparin. After appropriate circulation time the aorta was clamped just below the level of the diaphragm. An additional clamp was placed just above the celiac origin. A longitudinal opening was made in the aorta with a 15 blade. The aorta was of good quality.  Next a 12 x 6 mm Dacron graft was brought in operative field and beveled. It was then sewn end of graft to side of aorta using a running 3-0 Prolene suture. The distal aorta  was backbled to test the anastomosis. There were two  Areas of bleeding near the toe of the anastomosis and this was repaired with 2 pledgeted Prolene sutures. Both limbs of the graft  were then filled with blood after removing the supraceliac aortic clamp. Each limb was then flushed thoroughly. The distal aortic clamp was then removed restoring circulation to the aorta distally. The supraceliac aortic clamp was in place for approximately one hour. The limbs of the graft were occluded with straight coarct clamps right at the origin. Vessel loops were then used to control each branch of the celiac. The proximal celiac was controlled with a Henley clamp. A longitudinal opening was made in the confluens of the branches. The graft was cut to length and beveled. The graft was then sewn end of graft to side of artery using a running 6-0 Prolene suture. Just prior to completion of the anastomosis it was forebled backbled and thoroughly flushed. The anastomosis was secured; clamps released; and there was good pulsatile flow in the major branches of the celiac artery immediately. 2 repair stitches were placed on the right wall of the anastomosis. This was then hemostatic.  An additional 5000 unit of heparin was given.  Next the remaining limb of the graft was brought through the retropancreatic tunnel down to the level of the superior mesenteric artery. At this point I noted there was some redundancy of the celiac limb of the graft so this was clamped proximally and distally and about 3 cm of graft resected and then reanastomosed end to end.  The usual flushing procedures were performed and flow then restored to the celiac.  Of note the superior mesenteric artery now had pulsatile retrograde flow in it from the celiac bypass. Vessel loops were used to control all the branches of the superior mesenteric artery. A longitudinal opening was made on the anterior wall is superior mesenteric artery. The graft was cut to length and sewn end of graft to side of artery using a running 6-0 Prolene suture. Just prior to completion of the anastomosis it was forebled backbled and thoroughly flushed. The anastomosis was  secured and the clamps were released. There was good pulsatile flow in the distal superior mesenteric artery immediately. This augmented approximately 90% with unclamping of the graft. Hemostasis was obtained with 2 additional repair sutures. The retroperitoneum was then closed with a running 3-0 Vicryl suture. The viscera were returned to their normal position in the Omni retractor removed. The small bowel was pink and viable. The colon was pink and viable. The left lateral segment of the liver was slightly dusky but the rest of the liver was well perfused.  The supraceliac aorta was inspected and all this was hemostatic as well. The left crus of the diaphragm was fairly macerated and I did not believe it would accept any sutures to close this. At this point all retractors were removed. The fascia was reapproximated using a #1 PDS suture. The skin was closed with staples. The patient tolerated the procedure well and there were no complications. Instrument sponge and needle count was correct at the end of the case. The patient had palpable femoral pulses at the end of the case. Patient's feet were pink and warm at the end of the case. The patient was taken to the intensive care unit in stable but critical condition on a ventilator.  Fabienne Bruns, MD [facility] Office: [phone] Pager: [phone]

## 2012-09-25 NOTE — Interval H&P Note (Signed)
History and Physical Interval Note:  09/25/2012 8:34 AM  Dorothy [name]  has presented today for surgery, with the diagnosis of MESENTERIC [id number]  The various methods of treatment have been discussed with the patient and family. After consideration of risks, benefits and other options for treatment, the patient has consented to  Procedure(s) (LRB) with comments: MESENTERIC ARTERY BYPASS (N/A) CELIAC ARTERY BYPASS (N/A) as a surgical intervention .  The patient's history has been reviewed, patient examined, no change in status, stable for surgery.  I have reviewed the patient's chart and labs.  Questions were answered to the patient's satisfaction.     Bintou Lafata E

## 2012-09-25 NOTE — Transfer of Care (Signed)
Immediate Anesthesia Transfer of Care Note  Patient: Dorothy [name]  Procedure(s) Performed: Procedure(s) (LRB) with comments: MESENTERIC ARTERY BYPASS (N/A) - Bypass using Hemashield Gold Vascular Graft 12mm x 6mm x 40cm CELIAC ARTERY BYPASS (N/A) - Bypass using Hemashield Gold Vascular Graft 12mm x 6mm x 40cm  Patient Location: PACU  Anesthesia Type:General  Level of Consciousness: awake  Airway & Oxygen Therapy: Patient Spontanous Breathing and Patient connected to face mask oxygen  Post-op Assessment: Report given to PACU RN, Post -op Vital signs reviewed and stable and Patient moving all extremities  Post vital signs: Reviewed and stable  Complications: No apparent anesthesia complications

## 2012-09-25 NOTE — H&P (View-Only) (Signed)
History of Present Illness: Patient is a 56 y.o. year old female who presents for evaluation of abdominal pain and mesenteric ischemia. She was last seen approximately one month ago. She returns today after followup from her cardiac stress test. The patient has had a history of diffuse lower abdominal pain for approximately one year. This sometimes occurs with eating. However, it can occur randomly. She has lost 25 pounds in the last 5 months. She has also developed food fear of the last few months. She denies nausea vomiting or diarrhea. She denies any blood per rectum. She states that she does have right first that occur at the time of her onset of pain. She states that the pain can last for several hours with no relief. She has not had any interventions to improve her symptoms. She is still smoking approximately 5 cigarettes per day.  Other medical problems include hypertension, hypothyroidism, depression, multiple joint arthritis. These are all currently controlled.   Past Medical History   Diagnosis  Date   .  Constipation    .  Hypertension      takes meds   .  Hypothyroidism      on Levonthyroxine   .  Pneumonia      x 2- last time > 5 years ago   .  Depression      Takes Klonopin   .  Arthritis      hands and Knees   .  Avascular necrosis of bones of both hips  01/17/2012   .  Hypothyroidism (acquired)  01/17/2012     Since age 70   .  Benign essential HTN  01/17/2012   .  Depression with anxiety  01/17/2012   .  Ovarian cyst  01/17/2012     1995 BSO   .  Pneumonia  01/17/2012     10 yrs ago and 5 years ago   .  Rash and other nonspecific skin eruption  01/17/2012     Dorsum hands & extensor surfaces arms; intermittent   .  Cancer      ovarian   .  Cancer of cervix  01/17/2012    Past Surgical History   Procedure  Date   .  Total hip arthroplasty  09/06/2011     Procedure: TOTAL HIP ARTHROPLASTY; Surgeon: Loreta Ave, MD; Location: [facility] OR; Service: Orthopedics; Laterality: Left;  TOTAL HIP ARTHROPLASTY LEFT SIDE   .  Abdominal hysterectomy  1984   .  Ovarian cyst removal  1997   .  Total hip arthroplasty  10/11/2011     Procedure: TOTAL HIP ARTHROPLASTY; Surgeon: Loreta Ave, MD; Location: [facility] OR; Service: Orthopedics; Laterality: Right; 120 MINUTES FOR THIS SURGERY   .  Joint replacement    .  Colonoscopy  2009     [place]-polyps, 5 yr FU   Social History  History   Substance Use Topics   .  Smoking status:  Current Every Day Smoker -- 0.5 packs/day for 35 years     Types:  Cigarettes   .  Smokeless tobacco:  Never Used     Comment: pt states that she wants to quit but needs help was advised to call 1-800-QUITNOW and to speak with PCP about avaliable aids    .  Alcohol Use:  No   Family History  Family History   Problem  Relation  Age of Onset   .  Anesthesia problems  Neg Hx    .  Hypotension  Neg Hx    .  Malignant hyperthermia  Neg Hx    .  Pseudochol deficiency  Neg Hx    .  Breast cancer  Sister    .  Cancer  Sister       breast    .  Cancer  Mother       lung    .  Hypertension  Son    Allergies  Allergies   Allergen  Reactions   .  Sulfa Antibiotics  Rash    Current Outpatient Prescriptions   Medication  Sig  Dispense  Refill   .  clonazePAM (KLONOPIN) 0.5 MG tablet  Take 0.5 mg by mouth daily. Take 1 1/2 tab q day     .  estradiol (ESTRACE) 1 MG tablet  Take 1 mg by mouth daily.     .  hydrochlorothiazide (HYDRODIURIL) 25 MG tablet  Take 25 mg by mouth daily. Take 1/2 tablet per day     .  Levothyroxine Sodium 112 MCG CAPS  Take by mouth daily.     Marland Kitchen  omeprazole (PRILOSEC) 20 MG capsule  Take 20 mg by mouth daily.     .  calcium carbonate (OS-CAL) 600 MG TABS  Take 600 mg by mouth 2 (two) times daily with a meal. chewable     .  fish oil-omega-3 fatty acids 1000 MG capsule  Take 2 g by mouth daily. chewable     .  Multiple Vitamins-Minerals (MULTIVITAMIN WITH MINERALS) tablet  Take 1 tablet by mouth daily. Takes  chewable--gummies     .  venlafaxine (EFFEXOR-XR) 150 MG 24 hr capsule  Take 150 mg by mouth 2 (two) times daily.      ROS:  General: + weight loss, Fever, + chills  HEENT: No recent headaches, no nasal bleeding, no visual changes, no sore throat  Neurologic: No dizziness, blackouts, seizures. No recent symptoms of stroke or mini- stroke. No recent episodes of slurred speech, or temporary blindness.  Cardiac: No recent episodes of chest pain/pressure, no shortness of breath at rest. No shortness of breath with exertion. Denies history of atrial fibrillation or irregular heartbeat  Vascular: No history of rest pain in feet. No history of claudication. No history of non-healing ulcer, No history of DVT  Pulmonary: No home oxygen, no productive cough, no hemoptysis, No asthma or wheezing  Musculoskeletal: [x ] Arthritis, [ ]  Low back pain, [x ] Joint pain  Hematologic:No history of hypercoagulable state. No history of easy bleeding. No history of anemia  Gastrointestinal: No hematochezia or melena, No gastroesophageal reflux, no trouble swallowing  Urinary: [ ]  chronic Kidney disease, [ ]  on HD - [ ]  MWF or [ ]  TTHS, [ ]  Burning with urination, [ ]  Frequent urination, [ ]  Difficulty urinating;  Skin: No rashes  Psychological: + history of anxiety, + history of depression  Physical Examination   Filed Vitals:   09/05/12 1105  BP: 145/91  Pulse: 64  Resp: 16  Height: 5\' 1"  (1.549 m)  Weight: 172 lb (78.019 kg)  SpO2: 99%  General: Alert and oriented, no acute distress  HEENT: Normal  Neck: No bruit or JVD  Pulmonary: Clear to auscultation bilaterally  Cardiac: Regular Rate and Rhythm without murmur  Abdomen: Soft, non-tender, non-distended, no mass, well-healed lower midline scars, no bruit  Skin: No rash  Extremity Pulses: 2+ radial, brachial, 2+ left femoral, 1+ right femoral absent popliteal dorsalis pedis, posterior tibial pulses bilaterally  Musculoskeletal: No  deformity or edema   Neurologic: Upper and lower extremity motor 5/5 and symmetric   DATA: CT angiogram images were reviewed today shows a 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 She also has a 2-3 segment occlusion of the proximal superior mesenteric artery. The inferior mesenteric artery is diminutive. There was no evidence of abdominal aneurysm. The renal arteries were patent.   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   PLAN: Aorto to superior mesenteric and celiac artery bypass scheduled for 09/25/2012. Risks benefits possible complications and procedure details were discussed with the patient today. These include but are not limited to bleeding infection myocardial events recurrent gut ischemia or bowel infarction possible risk for transfusion.  Smoking cessation was again discussed with the patient and also it was discussed with her that durability could be limited if she continues to smoke  Fabienne Bruns, MD  [facility]  Office: [phone]  Pager: [phone]

## 2012-09-25 NOTE — Anesthesia Preprocedure Evaluation (Addendum)
Anesthesia Evaluation  Patient identified by MRN, date of birth, ID band Patient awake    Reviewed: Allergy & Precautions, H&P , NPO status , Patient's Chart, lab work & pertinent test results  History of Anesthesia Complications Negative for: history of anesthetic complications  Airway Mallampati: II TM Distance: >3 FB Neck ROM: Full    Dental  (+) Teeth Intact and Dental Advisory Given   Pulmonary pneumonia -, resolved, Current Smoker,    Pulmonary exam normal       Cardiovascular hypertension, Pt. on medications + Peripheral Vascular Disease  EF=58% by EST   Neuro/Psych PSYCHIATRIC DISORDERS Depression negative neurological ROS     GI/Hepatic negative GI ROS, Neg liver ROS,   Endo/Other  Hypothyroidism   Renal/GU negative Renal ROS     Musculoskeletal   Abdominal   Peds  Hematology negative hematology ROS (+)   Anesthesia Other Findings   Reproductive/Obstetrics                      Anesthesia Physical Anesthesia Plan  ASA: III  Anesthesia Plan: General   Post-op Pain Management:    Induction: Intravenous  Airway Management Planned: Oral ETT  Additional Equipment: Arterial line  Intra-op Plan:   Post-operative Plan: Extubation in OR  Informed Consent: I have reviewed the patients History and Physical, chart, labs and discussed the procedure including the risks, benefits and alternatives for the proposed anesthesia with the patient or authorized representative who has indicated his/her understanding and acceptance.   Dental advisory given  Plan Discussed with: CRNA, Anesthesiologist and Surgeon  Anesthesia Plan Comments:        Anesthesia Quick Evaluation

## 2012-09-25 NOTE — Consult Note (Signed)
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  PCCM consulted for post op medical management.    LINES / TUBES: Peripheral IV  CULTURES:   ANTIBIOTICS: 09/25/2012 Cefuroxime (periop) >>   SIGNIFICANT EVENTS:  12/04 supraceliac aorta to celiac and superior mesenteric artery bypass   LEVEL OF CARE:  ICU PRIMARY SERVICE:  Vascular CONSULTANTS:  PCCM CODE STATUS: full  DIET:  npo DVT Px:  scd GI Px:  ppi  HISTORY OF PRESENT ILLNESS:  56 y/o female with hypertension and mesenteric ischaemia who underwent a supraceliac aorta to celiac and superior mesenteric artery bypass on 09/25/2012.  PCCM consulted for post op medical management.  She was extubated in the OR but had a respiratory acidosis afterwards.   PAST MEDICAL HISTORY :  Past Medical History  Diagnosis Date  . Constipation   . Hypertension     takes meds  . Hypothyroidism     on Levonthyroxine  . Pneumonia     x 2- last time > 5 years ago  . Depression     Takes Klonopin  . Arthritis     hands and Knees  . Avascular necrosis of bones of both hips 01/17/2012  . Hypothyroidism (acquired) 01/17/2012    Since age 76  . Benign essential HTN 01/17/2012  . Depression with anxiety 01/17/2012  . Ovarian cyst 01/17/2012    1995  BSO  . Pneumonia 01/17/2012    10 yrs ago and 5 years ago   . Rash and other nonspecific skin eruption 01/17/2012    Dorsum hands & extensor surfaces arms; intermittent  . Cancer     ovarian  . Cancer of cervix 01/17/2012   Past Surgical History  Procedure Date  . Total hip arthroplasty 09/06/2011    Procedure: TOTAL HIP  ARTHROPLASTY;  Surgeon: Loreta Ave, MD;  Location: [facility] OR;  Service: Orthopedics;  Laterality: Left;  TOTAL HIP ARTHROPLASTY LEFT SIDE  . Abdominal hysterectomy 1984  . Ovarian cyst removal 1997  . Total hip arthroplasty 10/11/2011    Procedure: TOTAL HIP ARTHROPLASTY;  Surgeon: Loreta Ave, MD;  Location: [facility] OR;  Service: Orthopedics;  Laterality: Right;  120 MINUTES FOR THIS SURGERY  . Colonoscopy 2009    [place]-polyps, 5 yr FU  . Joint replacement     bil hip    Prior to Admission medications   Medication Sig Start Date End Date Taking? Authorizing Provider  clonazePAM (KLONOPIN) 0.5 MG tablet Take 0.75 mg by mouth at bedtime as needed. For sleep.   Yes Historical Provider, MD  estradiol (ESTRACE) 1 MG tablet Take 1 mg by mouth daily.     Yes Historical Provider, MD  hydrochlorothiazide (HYDRODIURIL) 25 MG tablet Take 12.5 mg by mouth daily.   Yes Historical Provider, MD  levothyroxine (SYNTHROID, LEVOTHROID) 112 MCG tablet Take 112 mcg by mouth daily.   Yes Historical Provider, MD  omeprazole (PRILOSEC) 40 MG capsule Take 40 mg by mouth daily.   Yes Historical Provider, MD  varenicline (CHANTIX STARTING MONTH PAK) 0.5 MG X 11 & 1 MG X 42 tablet Take one 0.5 mg tablet by mouth once daily for  3 days, then increase to one 0.5 mg tablet twice daily for 4 days, then increase to one 1 mg tablet twice daily.   Yes Historical Provider, MD  venlafaxine (EFFEXOR-XR) 150 MG 24 hr capsule Take 150 mg by mouth 2 (two) times daily.    Yes Historical Provider, MD   Allergies  Allergen Reactions  . Sulfa Antibiotics Rash    FAMILY HISTORY:  Family History  Problem Relation Age of Onset  . Anesthesia problems Neg Hx   . Hypotension Neg Hx   . Malignant hyperthermia Neg Hx   . Pseudochol deficiency Neg Hx   . Breast cancer Sister   . Cancer Sister     breast  . Cancer Mother     lung  . Hypertension Son   . Heart failure Father    SOCIAL HISTORY:  reports that she has been  smoking Cigarettes.  She has a 20 pack-year smoking history. She has never used smokeless tobacco. She reports that she does not drink alcohol or use illicit drugs.  REVIEW OF SYSTEMS:   Somnolent, could not obtain   INTERVAL HISTORY:   VITAL SIGNS: Temp:  [97 F (36.1 C)-98.6 F (37 C)] 98.6 F (37 C) (12/04 2000) Pulse Rate:  [74-98] 95  (12/04 2000) Resp:  [12-18] 14  (12/04 2000) BP: (133-143)/(70-87) 133/70 mmHg (12/04 2000) SpO2:  [92 %-96 %] 95 % (12/04 2000) Arterial Line BP: (92-98)/(59-67) 98/67 mmHg (12/04 2000) Weight:  [77.9 kg (171 lb 11.8 oz)] 77.9 kg (171 lb 11.8 oz) (12/04 2000) HEMODYNAMICS:   VENTILATOR SETTINGS:   INTAKE / OUTPUT: Intake/Output      12/04 0701 - 12/05 0700   I.V. (mL/kg) 4425 (56.8)   Blood 150   NG/GT 30   IV Piggyback 500   Total Intake(mL/kg) 5105 (65.5)   Urine (mL/kg/hr) 1125 (1)   Emesis/NG output 50   Blood 400   Total Output 1575   Net +3530         PHYSICAL EXAMINATION: Gen: sleepy but arouses, no acute distress, protecting airway HEENT: NCAT, PERRL, EOMi, NG in place PULM: slight wheezing bilaterally, diminished in bases CV: RRR, S4 noted, no JVD AB: BS absent, dressing intact Ext: warm, no edema, no clubbing, no cyanosis Derm: no rash or skin breakdown Neuro: Asleep but arouses easily to voice, oriented, maew   LABS: Cbc  Lab 09/25/12 1835  WBC 17.1*  HGB 12.3  HCT 35.5*  PLT 157    Chemistry   Lab 09/25/12 1835  NA 144  K 4.1  CL 110  CO2 24  BUN 11  CREATININE 0.70  CALCIUM 7.7*  MG --  PHOS --  GLUCOSE 149*    Liver fxn No results found for this basename: AST:3,ALT:3,ALKPHOS:3,BILITOT:3,PROT:3,ALBUMIN:3 in the last 168 hours coags  Lab 09/25/12 1835 09/25/12 0731  APTT >200* 29  INR -- 0.95   Sepsis markers No results found for this basename: LATICACIDVEN:3,PROCALCITON:3 in the last 168 hours Cardiac markers No results found for this basename: CKTOTAL:3,CKMB:3,TROPONINI:3 in the  last 168 hours BNP No results found for this basename: PROBNP:3 in the last 168 hours ABG  Lab 09/25/12 1845  PHART 7.259*  PCO2ART 50.9*  PO2ART 63.2*  HCO3 22.3  TCO2 24.0    CBG trend No results found for this basename: GLUCAP:5 in the last 168 hours  IMAGING:  ECG: NSR, no st wave changes  DIAGNOSES: Active Problems:  Hypothyroidism (acquired)  SMA stenosis  Mesenteric ischemia  Unspecified vascular  insufficiency of intestine  Acute respiratory failure with hypoxia   ASSESSMENT / PLAN:  PULMONARY  ASSESSMENT: 1) Post op respiratory acidosis >> due to narcotics/central drive suppressed, no respiratory distress and currently protecting airway 2) Wheezing>> history of smoking  PLAN:   -set up End tidal CO2 to ensure not giving too much narcotics -CXR now -O2 as needed -prn bronchodilators -outpatient PFT  CARDIOVASCULAR  ASSESSMENT:  1) HTN 2) 08/2012 nuclear stress normal 3) mesenteric ischaemia, s/p bypass  PLAN:  -post op surgical care per vascular -periperative metop  RENAL  ASSESSMENT:   1) No acute issues PLAN:   -monitor UOP  GASTROINTESTINAL  ASSESSMENT:   1) Blood in NG tube, likely relate to surgery PLAN:   -PPI -monitor output closely -repeat CBC in am  HEMATOLOGIC  ASSESSMENT:   1) blood in NG tube as above PLAN:  -CBC in AM  INFECTIOUS  ASSESSMENT:   1) no acute issues PLAN:   -monitor fever curve  ENDOCRINE  ASSESSMENT:   1) Hypothyroidism  PLAN:   -home synthroid  NEUROLOGIC  ASSESSMENT:   1) Post op pain PLAN:   -careful narcotics with respiratory acidosis  CLINICAL SUMMARY: this is a 56 y/o female with no known lung disease who underwent a supraceliac aorta bypass to the celiac artery and SMA on 09/25/2012. PCCM consulted for post op respiratory acidosis likely related to narcotics.  No need for intubation, will monitor carefully overnight.  Max Fickle Pulmonary and Critical Care  Medicine [facility] Pager: [phone]  09/25/2012, 8:57 PM

## 2012-09-26 ENCOUNTER — Encounter (HOSPITAL_COMMUNITY): Payer: Self-pay | Admitting: Vascular Surgery

## 2012-09-26 LAB — CBC
MCH: 28.2 pg (ref 26.0–34.0)
MCHC: 34.9 g/dL (ref 30.0–36.0)
Platelets: 69 10*3/uL — ABNORMAL LOW (ref 150–400)
Platelets: 52 10*3/uL — ABNORMAL LOW (ref 150–400)
RBC: 4.75 MIL/uL (ref 3.87–5.11)
RBC: 4.51 MIL/uL (ref 3.87–5.11)
RDW: 14.9 % (ref 11.5–15.5)
RDW: 15.2 % (ref 11.5–15.5)
WBC: 13.3 10*3/uL — ABNORMAL HIGH (ref 4.0–10.5)

## 2012-09-26 LAB — POCT I-STAT 3, ART BLOOD GAS (G3+)
Acid-base deficit: 4 mmol/L — ABNORMAL HIGH (ref 0.0–2.0)
Bicarbonate: 23 mEq/L (ref 20.0–24.0)
Patient temperature: 97.5
TCO2: 24 mmol/L (ref 0–100)

## 2012-09-26 LAB — COMPREHENSIVE METABOLIC PANEL
ALT: 820 U/L — ABNORMAL HIGH (ref 0–35)
Alkaline Phosphatase: 58 U/L (ref 39–117)
CO2: 23 mEq/L (ref 19–32)
Chloride: 112 mEq/L (ref 96–112)
GFR calc Af Amer: >90 mL/min (ref >90)
Glucose, Bld: 149 mg/dL — ABNORMAL HIGH (ref 70–99)
Potassium: 3.9 mEq/L (ref 3.5–5.1)
Sodium: 146 mEq/L — ABNORMAL HIGH (ref 135–145)
Total Protein: 5.1 g/dL — ABNORMAL LOW (ref 6.0–8.3)

## 2012-09-26 MED ORDER — NALOXONE HCL 0.4 MG/ML IJ SOLN: 0.4000 mg | INTRAMUSCULAR | Status: DC | PRN

## 2012-09-26 MED ORDER — PANTOPRAZOLE SODIUM 40 MG IV SOLR
40.0000 mg | Freq: Two times a day (BID) | INTRAVENOUS | Status: DC
  Administered 2012-09-26 – 2012-09-29 (×8): 40 mg via INTRAVENOUS
  Filled 2012-09-26 (×10): qty 40

## 2012-09-26 MED ORDER — ONDANSETRON HCL 4 MG/2ML IJ SOLN: 4.0000 mg | Freq: Four times a day (QID) | INTRAMUSCULAR | Status: DC | PRN

## 2012-09-26 MED ORDER — MORPHINE SULFATE (PF) 1 MG/ML IV SOLN
INTRAVENOUS | Status: DC
  Administered 2012-09-26: 09:00:00 via INTRAVENOUS
  Administered 2012-09-26: 9 mg via INTRAVENOUS
  Administered 2012-09-26: 18:00:00 via INTRAVENOUS
  Administered 2012-09-26: 9 mg via INTRAVENOUS
  Administered 2012-09-26: 4 mg via INTRAVENOUS
  Administered 2012-09-27: 06:00:00 via INTRAVENOUS
  Administered 2012-09-27: 9 mg via INTRAVENOUS
  Administered 2012-09-27: 8 mg via INTRAVENOUS
  Administered 2012-09-27: 5 mg via INTRAVENOUS
  Administered 2012-09-27: 6 mg via INTRAVENOUS
  Administered 2012-09-27: 5 mg via INTRAVENOUS
  Administered 2012-09-28: 0.71 mg via INTRAVENOUS
  Administered 2012-09-28: 2 mL via INTRAVENOUS
  Administered 2012-09-28: 5 mg via INTRAVENOUS
  Administered 2012-09-28 (×2): 3 mg via INTRAVENOUS
  Administered 2012-09-29: 15:00:00 via INTRAVENOUS
  Filled 2012-09-26 (×5): qty 25

## 2012-09-26 MED ORDER — DIPHENHYDRAMINE HCL 50 MG/ML IJ SOLN: 12.5000 mg | Freq: Four times a day (QID) | INTRAMUSCULAR | Status: DC | PRN

## 2012-09-26 MED ORDER — SODIUM CHLORIDE 0.9 % IJ SOLN
9.0000 mL | INTRAMUSCULAR | Status: DC | PRN
  Administered 2012-09-26: 3 mL via INTRAVENOUS

## 2012-09-26 MED ORDER — DEXTROSE-NACL 5-0.45 % IV SOLN
INTRAVENOUS | Status: DC
  Administered 2012-09-26 – 2012-09-29 (×8): via INTRAVENOUS

## 2012-09-26 MED ORDER — SODIUM CHLORIDE 0.9 % IV SOLN
INTRAVENOUS | Status: DC
  Administered 2012-09-26: 20 mL/h via INTRAVENOUS

## 2012-09-26 MED ORDER — DIPHENHYDRAMINE HCL 12.5 MG/5ML PO ELIX
12.5000 mg | ORAL_SOLUTION | Freq: Four times a day (QID) | ORAL | Status: DC | PRN
  Filled 2012-09-26: qty 5

## 2012-09-26 MED ORDER — SODIUM CHLORIDE 0.9 % IV SOLN
Freq: Once | INTRAVENOUS | Status: AC
  Administered 2012-09-26: 16:00:00 via INTRAVENOUS

## 2012-09-26 NOTE — Anesthesia Postprocedure Evaluation (Signed)
Anesthesia Post Note  Patient: Dorothy [name]  Procedure(s) Performed: Procedure(s) (LRB): MESENTERIC ARTERY BYPASS (N/A) CELIAC ARTERY BYPASS (N/A)  Anesthesia type: general  Patient location: PACU  Post pain: Pain level controlled  Post assessment: Patient's Cardiovascular Status Stable  Last Vitals:  Filed Vitals:   09/26/12 0700  BP: 114/99  Pulse: 94  Temp:   Resp: 20    Post vital signs: Reviewed and stable  Level of consciousness: sedated  Complications: No apparent anesthesia complications

## 2012-09-26 NOTE — Progress Notes (Signed)
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  PCCM consulted for post op medical management.  She was extubated in the OR but had a respiratory acidosis afterwards.  LINES / TUBES: Peripheral IV  CULTURES:  ANTIBIOTICS: 09/25/2012 Cefuroxime (periop) >>   SIGNIFICANT EVENTS:  12/04 supraceliac aorta to celiac and superior mesenteric artery bypass   LEVEL OF CARE:  ICU PRIMARY SERVICE:  Vascular CONSULTANTS:  PCCM CODE STATUS: full  DIET:  npo DVT Px:  scd GI Px:  ppi      INTERVAL HISTORY:  Et Co2 41 C/o pain  VITAL SIGNS: Temp:  [97 F (36.1 C)-98.6 F (37 C)] 98.3 F (36.8 C) (12/05 0749) Pulse Rate:  [90-102] 90  (12/05 0900) Resp:  [12-26] 17  (12/05 0900) BP: (113-146)/(63-99) 116/63 mmHg (12/05 0900) SpO2:  [92 %-98 %] 95 % (12/05 0900) Arterial Line BP: (79-123)/(57-80) 96/57 mmHg (12/05 0900) Weight:  [77.9 kg (171 lb 11.8 oz)-82 kg (180 lb 12.4 oz)] 82 kg (180 lb 12.4 oz) (12/05 0500)    INTAKE / OUTPUT: Intake/Output      12/04 0701 - 12/05 0700 12/05 0701 - 12/06 0700   I.V. (mL/kg) 5675 (69.2) 250 (3)   Blood 150    NG/GT 150 20   IV Piggyback 560    Total Intake(mL/kg) 6535 (79.7) 270 (3.3)   Urine (mL/kg/hr) 1800 (0.9) 100   Emesis/NG output 450 50   Blood 400    Total Output 2650 150   Net +3885 +120          PHYSICAL EXAMINATION: Gen: sleepy but arouses easily , no acute distress HEENT: NCAT, PERRL, EOMi, NG in place PULM: slight wheezing bilaterally, diminished in bases CV: RRR, S4 noted, no JVD AB: BS absent, dressing intact Ext: warm, no edema, no clubbing,  no cyanosis Derm: no rash or skin breakdown Neuro: Asleep but arouses easily to voice, oriented, maew   LABS: Cbc  Lab 09/26/12 0450 09/25/12 1835  WBC 13.0* --  HGB 13.4 12.3  HCT 38.4 35.5*  PLT 69* 157    Chemistry   Lab 09/26/12 0359 09/25/12 1835  NA 146* 144  K 3.9 4.1  CL 112 110  CO2 23 24  BUN 13 11  CREATININE 0.70 0.70  CALCIUM 7.9* 7.7*  MG 1.5 1.5  PHOS -- --  GLUCOSE 149* 149*    Liver fxn  Lab 09/26/12 0359  AST 760*  ALT 820*  ALKPHOS 58  BILITOT 0.3  PROT 5.1*  ALBUMIN 2.9*   coags  Lab 09/25/12 1835 09/25/12 0731  APTT >200* 29  INR 1.30 0.95   Sepsis markers No results found for this basename: LATICACIDVEN:3,PROCALCITON:3 in the last 168 hours Cardiac markers No results found for this basename: CKTOTAL:3,CKMB:3,TROPONINI:3 in the last 168 hours BNP No results found for this basename: PROBNP:3 in the last 168 hours ABG  Lab 09/26/12 0343 09/25/12 2111 09/25/12 1845  PHART 7.311* 7.268* 7.259*  PCO2ART 45.3* 51.0* 50.9*  PO2ART 79.0* 87.0 63.2*  HCO3 23.0 23.5 22.3  TCO2 24 25 24.0    CBG trend No results found for this basename: GLUCAP:5 in  the last 168 hours  IMAGING: no new infx  ECG: NSR, no st wave changes  DIAGNOSES: Active Problems:  Hypothyroidism (acquired)  SMA stenosis  Mesenteric ischemia  Unspecified vascular insufficiency of intestine  Acute respiratory failure with hypoxia   ASSESSMENT / PLAN:  PULMONARY  ASSESSMENT: 1) Post op respiratory acidosis >> due to narcotics/central drive suppressed, no respiratory distress and currently protecting airway 2) Wheezing>> history of smoking  PLAN:   -set up End tidal CO2 to ensure not giving too much narcotics -O2 as needed -prn bronchodilators -outpatient PFT -smoking cessation counselling- was on chantix  CARDIOVASCULAR  ASSESSMENT:  1) HTN 2) 08/2012 nuclear stress normal 3) mesenteric ischaemia, s/p bypass  PLAN:  -post op surgical care per  vascular -periperative metop  RENAL  ASSESSMENT:   1)hypernatremia PLAN:  Change IVfs to d51/2 -monitor UOP  GASTROINTESTINAL  ASSESSMENT:   1) Blood in NG tube, likely relate to surgery PLAN:   -PPI -monitor output closely  ENDOCRINE  ASSESSMENT:   1) Hypothyroidism  PLAN:   -resume home synthroid when able  NEUROLOGIC  ASSESSMENT:   1) Post op pain PLAN:   -careful narcotics with respiratory acidosis -Et co2 monitoring   Jozlynn Plaia V. Pulmonary and Critical Care Medicine [facility] Pager: [phone]  09/26/2012, 9:17 AM

## 2012-09-26 NOTE — Evaluation (Signed)
Mertis Mosher Ingold,PT Acute Rehabilitation [phone] (pager)  

## 2012-09-26 NOTE — Progress Notes (Addendum)
[facility] Progress Note   CC:  States she is having a lot of pain this am.  CV:   110's-140's systolic HR 90's-100  Regular  Filed Vitals:   09/26/12 0700  BP: 114/99  Pulse: 94  Temp:   Resp: 20    LUNGS:  extubated 7.31/45/79/23/95% 4LO2NC chest X-ray:  No xray this am  NEURO:  In tact  RENAL:  Intake/Output Summary (Last 24 hours) at 09/26/12 0730 Last data filed at 09/26/12 0700  Gross per 24 hour  Intake   6535 ml  Output   2650 ml  Net   3885 ml   No data found.  BMET    Component Value Date/Time   NA 146* 09/26/2012 0359   K 3.9 09/26/2012 0359   CL 112 09/26/2012 0359   CO2 23 09/26/2012 0359   GLUCOSE 149* 09/26/2012 0359   BUN 13 09/26/2012 0359   CREATININE 0.70 09/26/2012 0359   CREATININE 0.62 07/10/2012 1113   CALCIUM 7.9* 09/26/2012 0359   GFRNONAA >90 09/26/2012 0359   GFRAA >90 09/26/2012 0359    HEME/ID  Afebrile x 24 hrs . CBC    Component Value Date/Time   WBC 13.0* 09/26/2012 0450   WBC 7.5 01/17/2012 1126   RBC 4.75 09/26/2012 0450   RBC 5.58* 01/17/2012 1126   HGB 13.4 09/26/2012 0450   HGB 15.4 01/17/2012 1126   HCT 38.4 09/26/2012 0450   HCT 42.7 01/17/2012 1126   PLT 69* 09/26/2012 0450   PLT 223 01/17/2012 1126   MCV 80.8 09/26/2012 0450   MCV 76.5* 01/17/2012 1126   MCH 28.2 09/26/2012 0450   MCH 27.6 01/17/2012 1126   MCHC 34.9 09/26/2012 0450   MCHC 36.1* 01/17/2012 1126   RDW 14.9 09/26/2012 0450   RDW 15.4* 01/17/2012 1126   LYMPHSABS 2.4 01/17/2012 1126   MONOABS 0.6 01/17/2012 1126   EOSABS 0.1 01/17/2012 1126   BASOSABS 0.0 01/17/2012 1126    AST: 760 ALT 820 Amylase: 76  Blood Culture No results found for this basename: sdes, specrequest, cult, reptstatus   INCISION:  Bandage in tact and clean  ABDOMEN: Bloody drainage from NGT:  450cc Soft slightly distended; no BS and non tender with palpation  EXTREMITIES:  A/P: 56 y.o. female who is s/p Supraceliac aorta to celiac and superior mesenteric  artery bypass  POD 1  -resp acidosis last pm-improved this am -pt with a lot of pain this am in her lower back-states her abdomen is sore, but not painful-will order PCA -thrombocytopenia this am- ? HIT-will order -BUN/Cr normal this am with good UOP -continue NGT and NPO -acute surgical blood loss anemia-hct up slightly from last pm. Pt tolerating -leukocytosis to be expected-improved from last pm -continue to mobilize pt/IS/OOB to chair   Doreatha Massed, PA-C [facility] [phone]  Agree with above SCD for DVT prophylaxis for now until HIT profile returns PCA for pain Hypernatremia should resolve will follow Maintain fluid status as she is probably still third spacing OOB ambulate probably to floor tomorrow if stable Increase PCA if needed  Fabienne Bruns, MD [facility] [place] Office: [phone] Pager: [phone]

## 2012-09-26 NOTE — Evaluation (Signed)
Physical Therapy Evaluation Patient Details Name: Dorothy [name] MRN: [id number] DOB: [birth year] Today's Date: 09/26/2012 Time: 1308-6578 PT Time Calculation (min): 42 min  PT Assessment / Plan / Recommendation Clinical Impression  Pt s/p mesenteric and celiac artery bypass on 09/25/12 with h/o bilat. THA in Nov/Dec of 2012.  Pt presents today with high levels of pain and requesting to get out of bed.  Limited by pain, weakness, gait, and mobility, pt will benefit from skilled physical therapy services. Recommend CIR at this point, will continue to assess as pt progresses.      PT Assessment  Patient needs continued PT services    Follow Up Recommendations  CIR       Barriers to Discharge Decreased caregiver support; need to determine if Aunt or other family member can physically assist pt.       Equipment Recommendations  None recommended by PT    Recommendations for Other Services Rehab consult   Frequency Min 3X/week    Precautions / Restrictions Restrictions Weight Bearing Restrictions: No   Pertinent Vitals/Pain HR: 102 at rest, increased to 116 with activity O2% sats: 94-97% with activity on 4L O2 via [place] BP: beginning 117/50 (sidelying), mid 108/65 (sitting), mid 148/109 (walking), end 101/83 (sitting) Pain 5-6/10 sidelying, encouraged PCA pump, 4/10 with walking      Mobility  Bed Mobility Bed Mobility: Left Sidelying to Sit;Sitting - Scoot to Edge of Bed Left Sidelying to Sit: 1: +2 Total assist Left Sidelying to Sit: Patient Percentage: 30% Sitting - Scoot to Edge of Bed: 3: Mod assist Details for Bed Mobility Assistance: RN assisted with sidelying to sit, pt eager to sit and get up moving Transfers Transfers: Sit to Stand;Stand to Sit Sit to Stand: 4: Min guard;1: +1 Total assist;With upper extremity assist;From bed Stand to Sit: 4: Min assist;With upper extremity assist;To chair/3-in-1 Details for Transfer Assistance: Pt unsure of herself when going to sit  down, able to stand with ease, guard for balance Ambulation/Gait Ambulation/Gait Assistance: 4: Min assist Ambulation Distance (Feet): 300 Feet Assistive device: Other (Comment) (Pushed WC) Ambulation/Gait Assistance Details: cues for posture, breathing, and to stand close to [facility].  Pt moving slow with intermitent grunts/sighs Gait Pattern: Step-through pattern;Decreased stride length;Trunk flexed Gait velocity: decr Stairs: No Wheelchair Mobility Wheelchair Mobility: No           PT Diagnosis: Difficulty walking;Acute pain  PT Problem List: Decreased strength;Decreased activity tolerance;Decreased balance;Decreased mobility;Decreased knowledge of use of DME;Pain PT Treatment Interventions: DME instruction;Gait training;Stair training;Functional mobility training;Therapeutic activities;Therapeutic exercise;Balance training   PT Goals Acute Rehab PT Goals PT Goal Formulation: With patient Time For Goal Achievement: 10/10/12 Potential to Achieve Goals: Good Pt will go Supine/Side to Sit: Independently PT Goal: Supine/Side to Sit - Progress: Goal set today Pt will Sit at Edge of Bed: Independently PT Goal: Sit at Edge Of Bed - Progress: Goal set today Pt will go Sit to Supine/Side: Independently PT Goal: Sit to Supine/Side - Progress: Goal set today Pt will go Sit to Stand: Independently PT Goal: Sit to Stand - Progress: Goal set today Pt will go Stand to Sit: Independently PT Goal: Stand to Sit - Progress: Goal set today Pt will Ambulate: >150 feet;with least restrictive assistive device PT Goal: Ambulate - Progress: Goal set today Pt will Go Up / Down Stairs: 3-5 stairs;with modified independence;with rail(s) PT Goal: Up/Down Stairs - Progress: Goal set today  Visit Information  Last PT Received On: 09/26/12 Assistance Needed: +2 (lines/leads management )  Subjective Data  Subjective: I want to get up Patient Stated Goal: to return home and get back to gardening   Prior  Functioning  Home Living Lives With: Family (Aunt) Available Help at Discharge: Family;Available 24 hours/day (pt unsure if aunt can help physically ) Type of Home: House Home Access: Stairs to enter [employer] of Steps: 4 Entrance Stairs-Rails: Can reach both Home Layout: One level Bathroom Shower/Tub: Tub/shower unit Home Adaptive Equipment: Built-in shower seat;Grab bars around toilet;Grab bars in shower;Reacher;Walker - rolling Prior Function Level of Independence: Independent Able to Take Stairs?: Yes Driving: Yes Vocation: Unemployed Communication Communication: No difficulties Dominant Hand: Right    Cognition  Overall Cognitive Status: Appears within functional limits for tasks assessed/performed Arousal/Alertness: Awake/alert Orientation Level: Oriented X4 / Intact Behavior During Session: [facility] for tasks performed    Extremity/Trunk Assessment Right Upper Extremity Assessment RUE ROM/Strength/Tone: [employer] for tasks assessed Left Upper Extremity Assessment LUE ROM/Strength/Tone: WFL for tasks assessed Right Lower Extremity Assessment RLE ROM/Strength/Tone: [facility] for tasks assessed Left Lower Extremity Assessment LLE ROM/Strength/Tone: WFL for tasks assessed Trunk Assessment Trunk Assessment:  (Weak core muscles secondary to abdominal surgery)   Balance Balance Balance Assessed: Yes Static Sitting Balance Static Sitting - Balance Support: No upper extremity supported;Feet supported Static Sitting - Level of Assistance: 7: Independent Static Sitting - Comment/# of Minutes: maintained sitting EOB 5-8 min without lob Static Standing Balance Static Standing - Balance Support: Bilateral upper extremity supported;During functional activity Static Standing - Level of Assistance: 4: Min assist Static Standing - Comment/# of Minutes: incr RR and fatigue, cues for breathing; no lob  End of Session PT - End of Session Equipment Utilized During Treatment: Gait  belt;Oxygen Activity Tolerance: Patient limited by fatigue;Patient limited by pain Patient left: in chair;with call bell/phone within reach Nurse Communication: Mobility status       Sharion Balloon 09/26/2012, 1:34 PM Sharion Balloon, SPT Acute Rehab Services [phone]

## 2012-09-27 LAB — BASIC METABOLIC PANEL
Calcium: 7.8 mg/dL — ABNORMAL LOW (ref 8.4–10.5)
Creatinine, Ser: 0.87 mg/dL (ref 0.50–1.10)
GFR calc Af Amer: 85 mL/min — ABNORMAL LOW (ref >90)
GFR calc non Af Amer: 74 mL/min — ABNORMAL LOW (ref >90)
Sodium: 142 mEq/L (ref 135–145)

## 2012-09-27 LAB — CBC
MCH: 28.1 pg (ref 26.0–34.0)
MCHC: 34.5 g/dL (ref 30.0–36.0)
MCV: 81.4 fL (ref 78.0–100.0)
Platelets: 35 10*3/uL — ABNORMAL LOW (ref 150–400)
RBC: 4.24 MIL/uL (ref 3.87–5.11)
RDW: 15.3 % (ref 11.5–15.5)

## 2012-09-27 MED ORDER — POTASSIUM CHLORIDE 10 MEQ/100ML IV SOLN
INTRAVENOUS | Status: AC
  Administered 2012-09-27: 10 meq
  Filled 2012-09-27: qty 200

## 2012-09-27 NOTE — Progress Notes (Signed)
PULMONARY  / CRITICAL CARE MEDICINE  Name: Dorothy [name] MRN: [id number] DOB: 1956-08-30    LOS: 2  REFERRING MD :  Darrick Penna  CHIEF COMPLAINT:  Mesenteric ischaemia, post supraceliac aorta to celiac and superior mesenteric artery bypass  BRIEF PATIENT DESCRIPTION: 56 y/o female with hypertension and mesenteric ischaemia who underwent a supraceliac aorta to celiac and superior mesenteric artery bypass on 09/25/2012.  PCCM consulted for post op medical management.  She was extubated in the OR but had a respiratory acidosis afterwards.  LINES / TUBES: Peripheral IV  CULTURES:  ANTIBIOTICS: 09/25/2012 Cefuroxime (periop) >>   SIGNIFICANT EVENTS:  12/04 supraceliac aorta to celiac and superior mesenteric artery bypass     INTERVAL HISTORY:  C/o pain - improved  VITAL SIGNS: Temp:  [98 F (36.7 C)-99.2 F (37.3 C)] 98.2 F (36.8 C) (12/06 0743) Pulse Rate:  [94-119] 109  (12/06 0800) Resp:  [16-27] 16  (12/06 0800) BP: (88-113)/(58-83) 98/60 mmHg (12/06 0800) SpO2:  [91 %-98 %] 96 % (12/06 0800) Arterial Line BP: (82-122)/(60-79) 96/63 mmHg (12/06 0800) FiO2 (%):  [95 %] 95 % (12/06 0400) Weight:  [83.9 kg (184 lb 15.5 oz)] 83.9 kg (184 lb 15.5 oz) (12/06 0500)  Vent Mode:  [-]  FiO2 (%):  [95 %] 95 % INTAKE / OUTPUT: Intake/Output      12/05 0701 - 12/06 0700 12/06 0701 - 12/07 0700   I.V. (mL/kg) 4296.6 (51.2) 28 (0.3)   Blood     NG/GT 220 20   IV Piggyback 60 150   Total Intake(mL/kg) 4576.6 (54.5) 198 (2.4)   Urine (mL/kg/hr) 1255 (0.6) 30   Emesis/NG output 800    Blood     Total Output 2055 30   Net +2521.6 +168          PHYSICAL EXAMINATION: Gen: oob to chair , no acute distress HEENT: NCAT, PERRL, EOMi, NG in place with bloody output PULM: slight wheezing bilaterally, diminished in bases CV: RRR, S4 noted, no JVD AB: BS absent, dressing intact Ext: warm, no edema, no clubbing, no cyanosis Derm: no rash or skin breakdown Neuro: Alert, awake, non  focal, oriented   LABS: Cbc  Lab 09/27/12 0410 09/26/12 1828 09/26/12 0450  WBC 13.2* -- --  HGB 11.9* 12.8 13.4  HCT 34.5* 36.6 38.4  PLT 35* 52* 69*    Chemistry   Lab 09/27/12 0410 09/26/12 0359 09/25/12 1835  NA 142 146* 144  K 3.5 3.9 4.1  CL 111 112 110  CO2 24 23 24   BUN 17 13 11   CREATININE 0.87 0.70 0.70  CALCIUM 7.8* 7.9* 7.7*  MG -- 1.5 1.5  PHOS -- -- --  GLUCOSE 130* 149* 149*    Liver fxn  Lab 09/26/12 0359  AST 760*  ALT 820*  ALKPHOS 58  BILITOT 0.3  PROT 5.1*  ALBUMIN 2.9*   coags  Lab 09/25/12 1835 09/25/12 0731  APTT >200* 29  INR 1.30 0.95   Sepsis markers No results found for this basename: LATICACIDVEN:3,PROCALCITON:3 in the last 168 hours Cardiac markers No results found for this basename: CKTOTAL:3,CKMB:3,TROPONINI:3 in the last 168 hours BNP No results found for this basename: PROBNP:3 in the last 168 hours ABG  Lab 09/26/12 0343 09/25/12 2111 09/25/12 1845  PHART 7.311* 7.268* 7.259*  PCO2ART 45.3* 51.0* 50.9*  PO2ART 79.0* 87.0 63.2*  HCO3 23.0 23.5 22.3  TCO2 24 25 24.0    CBG trend No results found for this basename: GLUCAP:5 in  the last 168 hours  IMAGING: no new infx  ECG: NSR, no st wave changes  DIAGNOSES: Active Problems:  Hypothyroidism (acquired)  SMA stenosis  Mesenteric ischemia  Unspecified vascular insufficiency of intestine  Acute respiratory failure with hypoxia   ASSESSMENT / PLAN:  PULMONARY  ASSESSMENT: 1) Post op respiratory acidosis >> due to narcotics/central drive suppressed, no respiratory distress and currently protecting airway 2) Wheezing>> history of smoking  PLAN:   -monitorEnd tidal CO2 to ensure not giving too much narcotics -O2 as needed -prn bronchodilators -outpatient PFT -smoking cessation counselling- was on chantix, can resume on dc  CARDIOVASCULAR  ASSESSMENT:  1) HTN 2) 08/2012 nuclear stress normal 3) mesenteric ischaemia, s/p bypass  PLAN:  -post op  surgical care per vascular -periperative metop  RENAL  ASSESSMENT:   1)hypernatremia - resolved PLAN:  Change IVfs to d51/2 -monitor UOP  GASTROINTESTINAL  ASSESSMENT:   1) Blood in NG tube, likely relate to surgery PLAN:   -PPI -monitor output closely, hb stable  Heme - Thrombocytopenia  HITT pending Avoid heparin products  ENDOCRINE  ASSESSMENT:   1) Hypothyroidism  PLAN:   -resume home synthroid when able  NEUROLOGIC  ASSESSMENT:   1) Post op pain PLAN:   -PCA   PCCM to sign off , pl call as needed, OK to transfer to SDU  [facility]. Pulmonary and Critical Care Medicine [facility] Pager: [phone]  09/27/2012, 9:42 AM

## 2012-09-27 NOTE — Progress Notes (Signed)
Rehab Admissions Coordinator Note:  Patient was screened by Clois Dupes for appropriateness for an Inpatient Acute Rehab Consult. Patient ambulated 600 feet pushing a wheelchair with PT today. Patient not in need of intensive inpt rehab at this level.   At this time, we are recommending Skilled Nursing Facility or home with [facility] and 24/7 assist of family.  Clois Dupes, RN 09/27/2012, 3:24 PM  I can be reached at [phone].

## 2012-09-27 NOTE — Progress Notes (Signed)
Physical Therapy Treatment Patient Details Name: Dorothy [name] MRN: [id number] DOB: 04-20-1956 Today's Date: 09/27/2012 Time: 4098-1191 PT Time Calculation (min): 31 min  PT Assessment / Plan / Recommendation Comments on Treatment Session  Pt s/p mesenteric and celiac artery bypass on 09/25/12 with h/o bilat. THA in Nov/Dec of 2012.  Pt needed less assist and ambulated further today.  Progressing well.      Follow Up Recommendations  CIR     Does the patient have the potential to tolerate intense rehabilitation     Barriers to Discharge        Equipment Recommendations  None recommended by PT    Recommendations for Other Services Rehab consult  Frequency Min 3X/week   Plan Discharge plan remains appropriate;Frequency remains appropriate    Precautions / Restrictions Precautions Precautions: Fall Restrictions Weight Bearing Restrictions: No   Pertinent Vitals/Pain VSS, Some pain    Mobility  Bed Mobility Bed Mobility: Sit to Supine Left Sidelying to Sit: Not tested (comment) Sitting - Scoot to Edge of Bed: Not tested (comment) Sit to Supine: 4: Min assist;HOB flat Details for Bed Mobility Assistance: Needed help for LEs Transfers Transfers: Sit to Stand;Stand to Sit Sit to Stand: 4: Min guard;With upper extremity assist;From chair/3-in-1;With armrests Stand to Sit: 4: Min assist;With upper extremity assist;To bed Details for Transfer Assistance: Needed guarding for balance. Ambulation/Gait Ambulation/Gait Assistance: 4: Min guard Ambulation Distance (Feet): 600 Feet Assistive device:  (pushed wheelchair) Ambulation/Gait Assistance Details: cues for posture, breathing and to stand close to wheelchair; moves slowly Gait Pattern: Step-through pattern;Decreased stride length;Trunk flexed Gait velocity: decreased Stairs: No Wheelchair Mobility Wheelchair Mobility: No      PT Goals Acute Rehab PT Goals PT Goal: Supine/Side to Sit - Progress: Progressing toward  goal PT Goal: Sit at Edge Of Bed - Progress: Progressing toward goal PT Goal: Sit to Supine/Side - Progress: Progressing toward goal PT Goal: Sit to Stand - Progress: Progressing toward goal PT Goal: Stand to Sit - Progress: Progressing toward goal PT Goal: Ambulate - Progress: Progressing toward goal  Visit Information  Last PT Received On: 09/27/12 Assistance Needed: +1    Subjective Data  Subjective: "I need to walk." Patient Stated Goal: To return home    Cognition  Overall Cognitive Status: Appears within functional limits for tasks assessed/performed Arousal/Alertness: Awake/alert Orientation Level: Appears intact for tasks assessed Behavior During Session: [facility] for tasks performed    Balance  Static Sitting Balance Static Sitting - Balance Support: No upper extremity supported;Feet supported Static Sitting - Level of Assistance: 7: Independent Static Sitting - Comment/# of Minutes: maintained sitting EOB for 5-8 min without LOB.   Static Standing Balance Static Standing - Balance Support: Bilateral upper extremity supported;During functional activity Static Standing - Level of Assistance: 4: Min assist Static Standing - Comment/# of Minutes: incr RR and fatigue, cues for breathing  End of Session PT - End of Session Equipment Utilized During Treatment: Gait belt;Oxygen Activity Tolerance: Patient tolerated treatment well Patient left: in bed;with call bell/phone within reach Nurse Communication: Mobility status       INGOLD,Remo Kirschenmann 09/27/2012, 3:06 PM  [facility] Acute Rehabilitation [phone] (pager)

## 2012-09-27 NOTE — Progress Notes (Addendum)
[facility] Progress Note  CC:  Feeling better today; PCA helping pain  CV:   80's-120 systolic HR 90-120 reg  Filed Vitals:   09/27/12 0700  BP: 105/77  Pulse: 117  Temp:   Resp: 19    LUNGS:  extubated Decreased BS at bases bilaterally  ABG    Component Value Date/Time   PHART 7.311* 09/26/2012 0343   PCO2ART 45.3* 09/26/2012 0343   PO2ART 79.0* 09/26/2012 0343   HCO3 23.0 09/26/2012 0343   TCO2 24 09/26/2012 0343   ACIDBASEDEF 4.0* 09/26/2012 0343   O2SAT 95.0 09/26/2012 0343   chest X-ray:  No cxr today  NEURO:   Sitting up in chair-neuro in tact  RENAL:  Intake/Output Summary (Last 24 hours) at 09/27/12 0725 Last data filed at 09/27/12 0700  Gross per 24 hour  Intake 3576.58 ml  Output   2055 ml  Net 1521.58 ml   No data found.  BMET    Component Value Date/Time   NA 142 09/27/2012 0410   K 3.5 09/27/2012 0410   CL 111 09/27/2012 0410   CO2 24 09/27/2012 0410   GLUCOSE 130* 09/27/2012 0410   BUN 17 09/27/2012 0410   CREATININE 0.87 09/27/2012 0410   CREATININE 0.62 07/10/2012 1113   CALCIUM 7.8* 09/27/2012 0410   GFRNONAA 74* 09/27/2012 0410   GFRAA 85* 09/27/2012 0410     HEME/ID:   Tm 99.2 now 99.1 . CBC    Component Value Date/Time   WBC 13.2* 09/27/2012 0410   WBC 7.5 01/17/2012 1126   RBC 4.24 09/27/2012 0410   RBC 5.58* 01/17/2012 1126   HGB 11.9* 09/27/2012 0410   HGB 15.4 01/17/2012 1126   HCT 34.5* 09/27/2012 0410   HCT 42.7 01/17/2012 1126   PLT 35* 09/27/2012 0410   PLT 223 01/17/2012 1126   MCV 81.4 09/27/2012 0410   MCV 76.5* 01/17/2012 1126   MCH 28.1 09/27/2012 0410   MCH 27.6 01/17/2012 1126   MCHC 34.5 09/27/2012 0410   MCHC 36.1* 01/17/2012 1126   RDW 15.3 09/27/2012 0410   RDW 15.4* 01/17/2012 1126   LYMPHSABS 2.4 01/17/2012 1126   MONOABS 0.6 01/17/2012 1126   EOSABS 0.1 01/17/2012 1126   BASOSABS 0.0 01/17/2012 1126    Blood Culture No results found for this basename: sdes, specrequest, cult, reptstatus     ABDOMEN: Soft; Non tender with palpation; no BS; no flatus  EXTREMITIES: Negative for BLE edema   A/P: 56 y.o. female who is s/p Supraceliac aorta to celiac and superior mesenteric artery bypass  POD 2  -thrombocytopenia worsening-down to 35K from 69K-await HIT panel.  Does not appear to have active bleeding-will not transfuse platelets at this time. -UOP improved yesterday with fluid bolus -Hgb down slightly, but pt tolerating -mobilizing better today-back pain improved with reduced dose PCA -continue IS 10x/hr -WBC stable -will order LFT's and amylase for am (not ordered for today) -continue NGT and IVF today  Doreatha Massed, PA-C [facility] [phone]  Some tachycardia continue beta blocker as BP tolerated Keep foley one more day to follow urine output OOB ambulate Post op ileus keep NG for now Thrombocytopenia- still trending down, pt states she did have a low platelet count in the past but workup was delayed due to her mesenteric disease.  HIT pending. Consider transfusing platelets if still lower or if bleeding.  Fabienne Bruns, MD [facility] [place] Office: [phone] Pager: [phone]

## 2012-09-27 NOTE — Evaluation (Signed)
Occupational Therapy Evaluation Patient Details Name: Dorothy [name] MRN: [id number] DOB: 1956-01-15 Today's Date: 09/27/2012 Time: 4098-1191 OT Time Calculation (min): 43 min  OT Assessment / Plan / Recommendation Clinical Impression  Pt s/p mesenteric and celiac artery bypass on 09/25/12 with h/o bilat. THA in Nov/Dec of 2012.  Pt presents to OT with generalized weakness.  She will benefit from OT to maximize safety and independence with BADLs to allow pt. to return home at supervision to modified independent level    OT Assessment  Patient needs continued OT Services    Follow Up Recommendations  No OT follow up;Supervision - Intermittent    Barriers to Discharge None    Equipment Recommendations  None recommended by PT;None recommended by OT    Recommendations for Other Services    Frequency  Min 2X/week    Precautions / Restrictions Precautions Precautions: Fall Restrictions Weight Bearing Restrictions: No       ADL  Eating/Feeding: NPO Grooming: Wash/dry hands;Teeth care;Min guard Where Assessed - Grooming: Supported standing Upper Body Bathing: Minimal assistance Where Assessed - Upper Body Bathing: Supported sitting Lower Body Bathing: Minimal assistance Where Assessed - Lower Body Bathing: Supported sit to stand Upper Body Dressing: Minimal assistance Where Assessed - Upper Body Dressing: Unsupported sitting Lower Body Dressing: Moderate assistance Where Assessed - Lower Body Dressing: Supported sit to Pharmacist, hospital: Minimal Dentist Method: Sit to Barista: Comfort height toilet Toileting - Clothing Manipulation and Hygiene: Moderate assistance Where Assessed - Toileting Clothing Manipulation and Hygiene: Standing Transfers/Ambulation Related to ADLs: Pt. ambulates with min A - slow pace ADL Comments: Pt. limited by pain and lines    OT Diagnosis: Generalized weakness;Acute pain  OT Problem List: Decreased  strength;Pain OT Treatment Interventions: Self-care/ADL training;Therapeutic activities;Patient/family education   OT Goals Acute Rehab OT Goals OT Goal Formulation: With patient Time For Goal Achievement: 10/11/12 Potential to Achieve Goals: Good ADL Goals Pt Will Perform Grooming: with modified independence;Standing at sink ADL Goal: Grooming - Progress: Goal set today Pt Will Perform Upper Body Bathing: with set-up ADL Goal: Upper Body Bathing - Progress: Goal set today Pt Will Perform Upper Body Dressing: with set-up;Sit to stand from chair;Sit to stand from bed ADL Goal: Upper Body Dressing - Progress: Goal set today Pt Will Perform Lower Body Dressing: with set-up;Sit to stand from chair;Sit to stand from bed ADL Goal: Lower Body Dressing - Progress: Goal set today Pt Will Transfer to Toilet: with modified independence;Ambulation;Regular height toilet ADL Goal: Toilet Transfer - Progress: Goal set today Pt Will Perform Toileting - Clothing Manipulation: with modified independence;Standing ADL Goal: Toileting - Clothing Manipulation - Progress: Goal set today  Visit Information  Last OT Received On: 09/27/12 Assistance Needed: +1    Subjective Data  Subjective: "My mouth feels so dry" Patient Stated Goal: To regain independence   Prior Functioning     Home Living Lives With: Family (aunt) Available Help at Discharge: Family;Available 24 hours/day Type of Home: House Home Access: Stairs to enter [employer] of Steps: 4 Entrance Stairs-Rails: Can reach both Home Layout: One level Bathroom Shower/Tub: Engineer, manufacturing systems: Standard Home Adaptive Equipment: Built-in shower seat;Grab bars around toilet;Grab bars in shower;Reacher;Walker - rolling Prior Function Level of Independence: Independent Able to Take Stairs?: Yes Driving: Yes Vocation: Unemployed Communication Communication: No difficulties Dominant Hand: Right          Vision/Perception     Cognition  Overall Cognitive Status: Appears within functional limits  for tasks assessed/performed Arousal/Alertness: Awake/alert Orientation Level: Appears intact for tasks assessed Behavior During Session: [facility] for tasks performed    Extremity/Trunk Assessment Right Upper Extremity Assessment RUE ROM/Strength/Tone: [facility] for tasks assessed RUE Coordination: WFL - gross/fine motor Left Upper Extremity Assessment LUE ROM/Strength/Tone: WFL for tasks assessed LUE Coordination: WFL - gross/fine motor Trunk Assessment Trunk Assessment: Normal     Mobility Bed Mobility Bed Mobility: Rolling Left;Left Sidelying to Sit;Sitting - Scoot to Edge of Bed Rolling Left: 3: Mod assist Left Sidelying to Sit: 3: Mod assist Sitting - Scoot to Edge of Bed: 4: Min guard Sit to Supine: 4: Min assist;HOB flat Details for Bed Mobility Assistance: Required assist to lift Trunk from bed  Transfers Transfers: Sit to Stand;Stand to Sit Sit to Stand: 4: Min guard;From bed;With upper extremity assist Stand to Sit: 4: Min assist;With upper extremity assist;To chair/3-in-1 Details for Transfer Assistance: needs guarding for balanc and assist to control descent when moving to seated position     Shoulder Instructions     Exercise     Balance Balance Balance Assessed: Yes Static Sitting Balance Static Sitting - Balance Support: No upper extremity supported;Feet supported Static Sitting - Level of Assistance: 7: Independent Static Sitting - Comment/# of Minutes: maintained sitting EOB for 5-8 min without LOB.   Static Standing Balance Static Standing - Balance Support: Bilateral upper extremity supported;During functional activity Static Standing - Level of Assistance: 4: Min assist Static Standing - Comment/# of Minutes: incr RR and fatigue, cues for breathing Dynamic Standing Balance Dynamic Standing - Balance Support: Left upper extremity supported Dynamic Standing - Level of  Assistance:  (min guard) Dynamic Standing - Balance Activities:  (ADL) Dynamic Standing - Comments: Stood for >10 mins.    End of Session OT - End of Session Activity Tolerance: Patient tolerated treatment well Patient left: in chair;with call bell/phone within reach Nurse Communication: Mobility status  GO     Odeal Welden, Ursula Alert M 09/27/2012, 5:05 PM

## 2012-09-28 LAB — COMPREHENSIVE METABOLIC PANEL
ALT: 1208 U/L — ABNORMAL HIGH (ref 0–35)
AST: 430 U/L — ABNORMAL HIGH (ref 0–37)
Albumin: 2.2 g/dL — ABNORMAL LOW (ref 3.5–5.2)
CO2: 24 mEq/L (ref 19–32)
Chloride: 109 mEq/L (ref 96–112)
Creatinine, Ser: 0.7 mg/dL (ref 0.50–1.10)
GFR calc non Af Amer: >90 mL/min (ref >90)
Sodium: 139 mEq/L (ref 135–145)
Total Bilirubin: 0.7 mg/dL (ref 0.3–1.2)

## 2012-09-28 LAB — CBC
HCT: 30.2 % — ABNORMAL LOW (ref 36.0–46.0)
MCH: 27.9 pg (ref 26.0–34.0)
MCV: 80.1 fL (ref 78.0–100.0)
Platelets: 47 10*3/uL — ABNORMAL LOW (ref 150–400)
RBC: 3.77 MIL/uL — ABNORMAL LOW (ref 3.87–5.11)
RDW: 14.7 % (ref 11.5–15.5)
WBC: 13.5 10*3/uL — ABNORMAL HIGH (ref 4.0–10.5)

## 2012-09-28 MED ORDER — BISACODYL 10 MG RE SUPP
10.0000 mg | Freq: Once | RECTAL | Status: AC
  Administered 2012-09-28: 10 mg via RECTAL
  Filled 2012-09-28: qty 1

## 2012-09-28 MED ORDER — POTASSIUM CHLORIDE 10 MEQ/100ML IV SOLN
10.0000 meq | INTRAVENOUS | Status: AC
  Administered 2012-09-28 (×3): 10 meq via INTRAVENOUS
  Filled 2012-09-28 (×3): qty 100

## 2012-09-28 MED ORDER — POTASSIUM CHLORIDE CRYS ER 20 MEQ PO TBCR: 20.0000 meq | EXTENDED_RELEASE_TABLET | Freq: Once | ORAL | Status: DC

## 2012-09-28 NOTE — Progress Notes (Signed)
Pt denies nausea, NG removed per order and protocol, pt tolerated well Egbert Garibaldi A

## 2012-09-28 NOTE — Progress Notes (Signed)
Pt tolerated 3rd walk in the hallway with 1+ assist and using rolling walker, O2 sat on room air down to 88 %- O2 2 liters applied sats up to 99, prior to ambulation in hallway pt up to bathroom and had small bowel movement Dorothy [name]

## 2012-09-28 NOTE — Progress Notes (Signed)
[facility] [place]  Post-op  Intra-abdominal Surgery note  Date of Surgery: 09/25/2012  Surgeon(s): Sherren Kerns, MD  3 Days Post-Op Procedure(s): MESENTERIC ARTERY BYPASS CELIAC ARTERY BYPASS  History of Present Illness  Dorothy [name] is a 56 y.o. female who is  up s/p Procedure(s): MESENTERIC ARTERY BYPASS CELIAC ARTERY BYPASS Pt is doing well. denies incisional pain; denies nausea/vomiting; denies diarrhea. has not had flatus;has not had BM  IMAGING: No results found.  Significant Diagnostic Studies: CBC Lab Results  Component Value Date   WBC 13.5* 09/28/2012   HGB 10.5* 09/28/2012   HCT 30.2* 09/28/2012   MCV 80.1 09/28/2012   PLT 47* 09/28/2012    BMET    Component Value Date/Time   NA 139 09/28/2012 0405   K 3.4* 09/28/2012 0405   CL 109 09/28/2012 0405   CO2 24 09/28/2012 0405   GLUCOSE 132* 09/28/2012 0405   BUN 11 09/28/2012 0405   CREATININE 0.70 09/28/2012 0405   CREATININE 0.62 07/10/2012 1113   CALCIUM 7.7* 09/28/2012 0405   GFRNONAA >90 09/28/2012 0405   GFRAA >90 09/28/2012 0405    COAG Lab Results  Component Value Date   INR 1.30 09/25/2012   INR 0.95 09/25/2012   INR SPECIMEN CLOTTED 09/13/2012   No results found for this basename: PTT    I/O last 3 completed shifts: In: 5215.7 [I.V.:4685.7; NG/GT:360; IV Piggyback:170] Out: 2285 [Urine:1685; Emesis/NG output:600]    Physical Examination BP Readings from Last 3 Encounters:  09/28/12 109/62  09/28/12 109/62  09/13/12 138/88   Temp Readings from Last 3 Encounters:  09/28/12 98.1 F (36.7 C) Oral  09/28/12 98.1 F (36.7 C) Oral  09/13/12 97.7 F (36.5 C)    SpO2 Readings from Last 3 Encounters:  09/28/12 96%  09/28/12 96%  09/13/12 98%   Pulse Readings from Last 3 Encounters:  09/28/12 78  09/28/12 78  09/13/12 71    General: A&O x 3, WDWN female in NAD Pulmonary: normal non-labored breathing , without Rales, rhonchi,  wheezing Cardiac: Heart rate :  regular ,  Abdomen:abdomen soft Abdominal wound:clean, dry, intact  Neurologic: A&O X 3; Appropriate Affect ; SENSATION: normal; MOTOR FUNCTION:  moving all extremities equally. Speech is fluent/normal  Vascular Exam:BLE warm and well perfused Extremities without ischemic changes, no Gangrene, no cellulitis; no open wounds;   LOWER EXTREMITY PULSES           RIGHT                                      LEFT      POSTERIOR TIBIAL  palpable  palpable   Assessment/Plan: Dorothy [name] is a 56 y.o. female who is 3 Days Post-Op Procedure(s): MESENTERIC ARTERY BYPASS CELIAC ARTERY BYPASS Post-op ilieus- no flatus or BM - will try dulcolax today Check residual NGT after 4 hours clamping Continue ambulation Transfer to floor       Marlowe Shores [phone] 09/28/2012 8:31 AM

## 2012-09-29 NOTE — Progress Notes (Signed)
Subjective: Interval History: none.. no nausea or vomiting. Has had a bowel movement Objective: Vital signs in last 24 hours: Temp:  [97.3 F (36.3 C)-98.8 F (37.1 C)] 98.7 F (37.1 C) (12/08 0400) Pulse Rate:  [72-86] 72  (12/08 0400) Resp:  [15-24] 18  (12/08 0734) BP: (114-138)/(53-71) 114/68 mmHg (12/08 0400) SpO2:  [96 %-100 %] 98 % (12/08 0734) FiO2 (%):  [93 %-98 %] 93 % (12/08 0000)  Intake/Output from previous day: 12/07 0701 - 12/08 0700 In: 530 [I.V.:510; NG/GT:20] Out: 1750 [Urine:1650; Emesis/NG output:100] Intake/Output this shift:    abdomen soft nontender. Incision healing nicely.  Lab Results:  [facility] 09/28/12 0405 09/27/12 0410  WBC 13.5* 13.2*  HGB 10.5* 11.9*  HCT 30.2* 34.5*  PLT 47* 35*   BMET  Basename 09/28/12 0405 09/27/12 0410  NA 139 142  K 3.4* 3.5  CL 109 111  CO2 24 24  GLUCOSE 132* 130*  BUN 11 17  CREATININE 0.70 0.87  CALCIUM 7.7* 7.8*    Studies/Results: Dg Chest 2 View  09/13/2012  *RADIOLOGY REPORT*  Clinical Data: Hypertension, smoker.  CHEST - 2 VIEW  Comparison: 08/11/2011  Findings: The heart, mediastinum hila are within normal limits. The lungs are mildly hyperexpanded, but clear.  No pleural effusion or pneumothorax.  The bony thorax is demineralized but intact.  IMPRESSION: No active disease of the chest.  No change from the prior study.   Original Report Authenticated By: Amie Portland, M.D.    Dg Chest Port 1 View  09/25/2012  *RADIOLOGY REPORT*  Clinical Data: Hypoxemia.  Postop.  PORTABLE CHEST - 1 VIEW  Comparison: Prior today  Findings: Nasogastric tube remains in place, and abdominal skin staples are noted.  Low lung volumes again seen.  Mild interstitial edema pattern shows no significant change.  Heart size is stable.  IMPRESSION: Mild interstitial edema, without significant change.   Original Report Authenticated By: Myles Rosenthal, M.D.    Dg Chest Portable 1 View  09/25/2012  *RADIOLOGY REPORT*  Clinical Data:  Postop  PORTABLE CHEST - 1 VIEW  Comparison: 09/13/2012  Findings: Borderline cardiomegaly.  Central mild vascular congestion without convincing pulmonary edema.  NG tube in place is noted.  No segmental infiltrate or pneumothorax.  IMPRESSION: Central mild vascular congestion without convincing pulmonary edema.  Borderline cardiomegaly.  No segmental infiltrate.  No pneumothorax.   Original Report Authenticated By: Natasha Mead, M.D.    Anti-infectives: Anti-infectives     Start     Dose/Rate Route Frequency Ordered Stop   09/26/12 0500   cefUROXime (ZINACEF) 1.5 g in dextrose 5 % 50 mL IVPB        1.5 g 100 mL/hr over 30 Minutes Intravenous Every 12 hours 09/25/12 1927 09/26/12 1808   09/24/12 1435   cefUROXime (ZINACEF) 1.5 g in dextrose 5 % 50 mL IVPB        1.5 g 100 mL/hr over 30 Minutes Intravenous 30 min pre-op 09/24/12 1435 09/25/12 1702          Assessment/Plan: s/p Procedure(s) (LRB) with comments: MESENTERIC ARTERY BYPASS (N/A) - Bypass using Hemashield Gold Vascular Graft 12mm x 6mm x 40cm CELIAC ARTERY BYPASS (N/A) - Bypass using Hemashield Gold Vascular Graft 12mm x 6mm x 40cm Stable overall. No nausea or vomiting and is having a bowel movement. Will try clear liquids today and advance to regular diet tomorrow. The patient has been mobilizing well   LOS: 4 days   Dorothy [name] 09/29/2012, 9:40 AM

## 2012-09-30 LAB — HEPARIN INDUCED THROMBOCYTOPENIA PNL
Heparin Induced Plt Ab: NEGATIVE
Patient O.D.: 0.162
UFH Low Dose 0.1 IU/mL: 0 % Release
UFH Low Dose 0.5 IU/mL: 2 % Release
UFH SRA Result: NEGATIVE

## 2012-09-30 MED ORDER — LEVOTHYROXINE SODIUM 112 MCG PO TABS: 112.0000 ug | ORAL_TABLET | Freq: Every day | ORAL | Status: DC

## 2012-09-30 MED ORDER — VENLAFAXINE HCL ER 150 MG PO CP24
150.0000 mg | ORAL_CAPSULE | Freq: Two times a day (BID) | ORAL | Status: DC
  Administered 2012-09-30 – 2012-10-03 (×6): 150 mg via ORAL
  Filled 2012-09-30 (×8): qty 1

## 2012-09-30 MED ORDER — CLONAZEPAM 0.5 MG PO TABS
0.7500 mg | ORAL_TABLET | Freq: Every evening | ORAL | Status: DC | PRN
  Administered 2012-09-30 – 2012-10-02 (×2): 0.75 mg via ORAL
  Filled 2012-09-30 (×2): qty 2

## 2012-09-30 MED ORDER — HYDROCHLOROTHIAZIDE 25 MG PO TABS
12.5000 mg | ORAL_TABLET | Freq: Every day | ORAL | Status: DC
  Filled 2012-09-30: qty 0.5

## 2012-09-30 MED ORDER — PANTOPRAZOLE SODIUM 40 MG PO TBEC: 40.0000 mg | DELAYED_RELEASE_TABLET | Freq: Every day | ORAL | Status: DC

## 2012-09-30 MED ORDER — PANTOPRAZOLE SODIUM 40 MG PO TBEC
40.0000 mg | DELAYED_RELEASE_TABLET | Freq: Every day | ORAL | Status: DC
  Administered 2012-10-01 – 2012-10-03 (×2): 40 mg via ORAL
  Filled 2012-09-30 (×2): qty 1

## 2012-09-30 MED ORDER — LEVOTHYROXINE SODIUM 112 MCG PO TABS
112.0000 ug | ORAL_TABLET | Freq: Every day | ORAL | Status: DC
  Administered 2012-10-01 – 2012-10-03 (×3): 112 ug via ORAL
  Filled 2012-09-30 (×4): qty 1

## 2012-09-30 MED ORDER — OXYCODONE-ACETAMINOPHEN 5-325 MG PO TABS: 1.0000 | ORAL_TABLET | ORAL | Status: DC | PRN

## 2012-09-30 MED ORDER — HYDROCHLOROTHIAZIDE 12.5 MG PO CAPS
12.5000 mg | ORAL_CAPSULE | Freq: Every day | ORAL | Status: DC
  Administered 2012-09-30 – 2012-10-03 (×3): 12.5 mg via ORAL
  Filled 2012-09-30 (×4): qty 1

## 2012-09-30 MED FILL — Heparin Sodium (Porcine) Inj 1000 Unit/ML: INTRAMUSCULAR | Qty: 30 | Status: AC

## 2012-09-30 MED FILL — Sodium Chloride IV Soln 0.9%: INTRAVENOUS | Qty: 1000 | Status: AC

## 2012-09-30 MED FILL — Sodium Chloride Irrigation Soln 0.9%: Qty: 3000 | Status: AC

## 2012-09-30 NOTE — Progress Notes (Signed)
Pt without complaint,  Some loose stools yesterday, no blood, no nausea or vomiting, less pain, ambulating some  Filed Vitals:   09/30/12 0000 09/30/12 0400 09/30/12 0546 09/30/12 0913  BP:   156/67   Pulse:   70   Temp:   97.7 F (36.5 C)   TempSrc:   Oral   Resp: 20 15 18 16   Height:      Weight:      SpO2: 100% 98% 95% 96%   Abdomen: soft incision healing  Extremities feet pink warm  CBC    Component Value Date/Time   WBC 13.5* 09/28/2012 0405   WBC 7.5 01/17/2012 1126   RBC 3.77* 09/28/2012 0405   RBC 5.58* 01/17/2012 1126   HGB 10.5* 09/28/2012 0405   HGB 15.4 01/17/2012 1126   HCT 30.2* 09/28/2012 0405   HCT 42.7 01/17/2012 1126   PLT 47* 09/28/2012 0405   PLT 223 01/17/2012 1126   MCV 80.1 09/28/2012 0405   MCV 76.5* 01/17/2012 1126   MCH 27.9 09/28/2012 0405   MCH 27.6 01/17/2012 1126   MCHC 34.8 09/28/2012 0405   MCHC 36.1* 01/17/2012 1126   RDW 14.7 09/28/2012 0405   RDW 15.4* 01/17/2012 1126   LYMPHSABS 2.4 01/17/2012 1126   MONOABS 0.6 01/17/2012 1126   EOSABS 0.1 01/17/2012 1126   BASOSABS 0.0 01/17/2012 1126    BMET    Component Value Date/Time   NA 139 09/28/2012 0405   K 3.4* 09/28/2012 0405   CL 109 09/28/2012 0405   CO2 24 09/28/2012 0405   GLUCOSE 132* 09/28/2012 0405   BUN 11 09/28/2012 0405   CREATININE 0.70 09/28/2012 0405   CREATININE 0.62 07/10/2012 1113   CALCIUM 7.7* 09/28/2012 0405   GFRNONAA >90 09/28/2012 0405   GFRAA >90 09/28/2012 0405    A: Improving post mesenteric bypass P: advance diet today Thrombocytopenia improving recheck tomorrow Cont to ambulate Check c  Diff if loose stools persist  Fabienne Bruns, MD [facility] [place] Office: [phone] Pager: [phone]

## 2012-09-30 NOTE — Care Management Note (Unsigned)
    Page 1 of 1   09/30/2012     2:58:24 PM   CARE MANAGEMENT NOTE 09/30/2012  Patient:  CORRISSA, MARTELLO   Account Number:  [id number]  Date Initiated:  09/26/2012  Documentation initiated by:  MAYO,HENRIETTA  Subjective/Objective Assessment:   56 yr-old female adm with dx of mesenteric ischemia; ambulates with a cane     Action/Plan:   MET WITH PT TO DISCUSS DC PLANS; PT STATES PTA, SHE LIVES WITH AUNT, WHO WILL ASSIST AT DC.  SHE HAS RW AND BSC AT HOME, IF NEEDED. WILL FOLLOW FOR ADDITIONAL NEEDS.   Anticipated DC Date:  10/02/2012   Anticipated DC Plan:  HOME W HOME HEALTH SERVICES      DC Planning Services  CM consult      Choice offered to / List presented to:             Status of service:  In process, will continue to follow Medicare Important Message given?   (If response is "NO", the following Medicare IM given date fields will be blank) Date Medicare IM given:   Date Additional Medicare IM given:    Discharge Disposition:    Per UR Regulation:  Reviewed for med. necessity/level of care/duration of stay  If discussed at Long Length of Stay Meetings, dates discussed:    Comments:  PCP: Ninfa Linden FNP with [facility]  Jerardo Costabile,RN,BSN [phone]

## 2012-09-30 NOTE — Discharge Summary (Signed)
[facility] Discharge Summary   Patient ID:  Dorothy [name] MRN: [id number] DOB/AGE: 06/25/1956 56 y.o.  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 Constipation   . Hypertension     takes meds  . Hypothyroidism     on Levonthyroxine  . Pneumonia     x 2- last time > 5 years ago  . Depression     Takes Klonopin  . Arthritis     hands and Knees  . Avascular necrosis of bones of both hips 01/17/2012  . Hypothyroidism (acquired) 01/17/2012    Since age 27  . Benign essential HTN 01/17/2012  . Depression with anxiety 01/17/2012  . Ovarian cyst 01/17/2012    1995  BSO  . Pneumonia 01/17/2012    10 yrs ago and 5 years ago   . Rash and other nonspecific skin eruption 01/17/2012    Dorsum hands & extensor surfaces arms; intermittent  . Cancer     ovarian  . Cancer of cervix 01/17/2012   09/25/12 Procedure(s): Supraceliac aorta to celiac and superior mesenteric artery bypass  MESENTERIC ARTERY BYPASS CELIAC ARTERY BYPASS  Discharged Condition: good  HPI:  Dorothy [name] is a 56 y.o. female is a 56 y.o. year old female who presents for evaluation of abdominal pain and mesenteric ischemia. She was last seen approximately one month ago. She returns today after followup from her cardiac stress test. The patient has had a history of diffuse lower abdominal pain for approximately one year. This sometimes occurs with eating. However, it can occur randomly. She has lost 25 pounds in the last 5 months. She has also developed food fear of the last few months. She denies nausea vomiting or diarrhea. She denies any blood per rectum. She states that she does have right first that occur at the time of her onset of pain. She states that the  pain can last for several hours with no relief. She has not had any interventions to improve her symptoms. She is still smoking approximately 5 cigarettes per day. Other medical problems include hypertension, hypothyroidism, depression, multiple joint arthritis. These are all currently controlled. She is admitted for a mesenteric bypass.   Hospital Course:  Dorothy [name] is a 56 y.o. female is S/P  Supraceliac aorta to celiac and superior mesenteric artery bypass Extubated: POD # 0 Post-op wounds healing well Pt. Ambulating, voiding and taking PO diet without difficulty. Pt pain controlled with PO pain meds. Labs as below Complications:hypokalemia sec to post-op diarrhea - resolved - Negative C. Diff Seroma of wound, taken back to OR for drainage  Consults:     Significant Diagnostic Studies: CBC Lab Results  Component Value Date   WBC 13.5* 09/28/2012   HGB 10.5* 09/28/2012   HCT 30.2* 09/28/2012   MCV 80.1 09/28/2012   PLT 47* 09/28/2012    BMET    Component Value Date/Time   NA 139 09/28/2012 0405   K 3.4* 09/28/2012 0405   CL 109 09/28/2012 0405   CO2 24 09/28/2012 0405   GLUCOSE 132* 09/28/2012 0405   BUN 11 09/28/2012 0405   CREATININE 0.70 09/28/2012 0405   CREATININE 0.62 07/10/2012 1113   CALCIUM 7.7* 09/28/2012 0405   GFRNONAA >90 09/28/2012 0405   GFRAA >90 09/28/2012 0405   COAG Lab  Results  Component Value Date   INR 1.30 09/25/2012   INR 0.95 09/25/2012   INR SPECIMEN CLOTTED 09/13/2012     Disposition:  Discharge to :Home Discharge Orders    Future Orders Please Complete By Expires   Resume previous diet      Driving Restrictions      Comments:   No driving for 4 weeks   Call MD for:  temperature >100.5      Call MD for:  redness, tenderness, or signs of infection (pain, swelling, bleeding, redness, odor or green/yellow discharge around incision site)      Call MD for:  severe or increased pain, loss or decreased feeling  in affected limb(s)      May  shower       Increase activity slowly      Comments:   Walk with assistance use walker or cane as needed   may wash over wound with mild soap and water      No dressing needed      ABDOMINAL PROCEDURE/ANEURYSM REPAIR/AORTO-BIFEMORAL BYPASS:  Call MD for increased abdominal pain; cramping diarrhea; nausea/vomiting         Dorothy, [name]  Home Medication Instructions [id number]   Printed on:09/30/12 1453  Medication Information                    venlafaxine (EFFEXOR-XR) 150 MG 24 hr capsule Take 150 mg by mouth 2 (two) times daily.            estradiol (ESTRACE) 1 MG tablet Take 1 mg by mouth daily.             omeprazole (PRILOSEC) 40 MG capsule Take 40 mg by mouth daily.           varenicline (CHANTIX STARTING MONTH PAK) 0.5 MG X 11 & 1 MG X 42 tablet Take one 0.5 mg tablet by mouth once daily for 3 days, then increase to one 0.5 mg tablet twice daily for 4 days, then increase to one 1 mg tablet twice daily.           clonazePAM (KLONOPIN) 0.5 MG tablet Take 0.75 mg by mouth at bedtime as needed. For sleep.           hydrochlorothiazide (HYDRODIURIL) 25 MG tablet Take 12.5 mg by mouth daily.           levothyroxine (SYNTHROID, LEVOTHROID) 112 MCG tablet Take 112 mcg by mouth daily.           Percocet 5/325 mg 1-2 tabs by mouth every 4 hours as needed for pain  Potassium 20 Meq BID x 3 days by mouth  Verbal and written Discharge instructions given to the patient. Wound care per Discharge AVS  F/U 2 weeks -Dr. Darrick Penna  Signed: Marlowe Shores 10/03/2012 8:35 AM

## 2012-09-30 NOTE — Progress Notes (Signed)
Patient ambulated using a rolling walker with nurse tech approximately 521ft.  Patient had a slow, steady gait. Patient tolerated the ambulation well and returned to bed resting. Will continue to monitor.

## 2012-10-01 ENCOUNTER — Telehealth: Payer: Self-pay | Admitting: Vascular Surgery

## 2012-10-01 LAB — CBC
HCT: 29.8 % — ABNORMAL LOW (ref 36.0–46.0)
Hemoglobin: 11.1 g/dL — ABNORMAL LOW (ref 12.0–15.0)
WBC: 13.1 10*3/uL — ABNORMAL HIGH (ref 4.0–10.5)

## 2012-10-01 NOTE — Progress Notes (Addendum)
[facility] Progress Note  10/01/2012 7:35 AM POD 6  Subjective:  No complaints; mobilized from bed to chair with minimal difficulty.  Afebrile x 24 hrs VSS 99%RA Filed Vitals:   10/01/12 0417  BP: 136/69  Pulse: 73  Temp: 98.4 F (36.9 C)  Resp: 18     Physical Exam: Cardiac:  RRR Lungs:  CTAB Abdomen:  Soft; Non tender with palpation; +BS; stools not as loose per pt Incisions:  C/d/i with staples in tact   CBC    Component Value Date/Time   WBC 13.1* 10/01/2012 0515   WBC 7.5 01/17/2012 1126   RBC 4.00 10/01/2012 0515   RBC 5.58* 01/17/2012 1126   HGB 11.1* 10/01/2012 0515   HGB 15.4 01/17/2012 1126   HCT 29.8* 10/01/2012 0515   HCT 42.7 01/17/2012 1126   PLT 139* 10/01/2012 0515   PLT 223 01/17/2012 1126   MCV 74.5* 10/01/2012 0515   MCV 76.5* 01/17/2012 1126   MCH 27.8 10/01/2012 0515   MCH 27.6 01/17/2012 1126   MCHC 37.2* 10/01/2012 0515   MCHC 36.1* 01/17/2012 1126   RDW 13.9 10/01/2012 0515   RDW 15.4* 01/17/2012 1126   LYMPHSABS 2.4 01/17/2012 1126   MONOABS 0.6 01/17/2012 1126   EOSABS 0.1 01/17/2012 1126   BASOSABS 0.0 01/17/2012 1126    BMET    Component Value Date/Time   NA 139 09/28/2012 0405   K 3.4* 09/28/2012 0405   CL 109 09/28/2012 0405   CO2 24 09/28/2012 0405   GLUCOSE 132* 09/28/2012 0405   BUN 11 09/28/2012 0405   CREATININE 0.70 09/28/2012 0405   CREATININE 0.62 07/10/2012 1113   CALCIUM 7.7* 09/28/2012 0405   GFRNONAA >90 09/28/2012 0405   GFRAA >90 09/28/2012 0405    INR    Component Value Date/Time   INR 1.30 09/25/2012 1835    No intake or output data in the 24 hours ending 10/01/12 0735   Assessment/Plan:  56 y.o. female is s/p Supraceliac aorta to celiac and superior mesenteric artery bypass   POD 5  -pt states stools are not as loose as before -tolerating diet without N/V -pain improving -thrombocytopenia continues to improve - HIT was negative -acute surgical blood loss anemia improving  Doreatha Massed,  PA-C [facility] [phone] 10/01/2012 7:35 AM  Overall well platelets improved taking PO diarrhea resolving, WBC 13 persisting Significant amount of serous straw colored fluid from midportion of incision.  I am unable to palpate fascia defect with valsalva but obviously concern for dehiscence vs seroma.  Will make NPO p midnight if drainage persists will explore in OR tomorrow.  NPO p midnight  Fabienne Bruns, MD [facility] [place] Office: [phone] Pager: [phone]

## 2012-10-01 NOTE — Progress Notes (Signed)
Dorothy [name],PT Acute Rehabilitation [phone] (pager)  

## 2012-10-01 NOTE — Progress Notes (Signed)
Abdominal incision dressing changed at this time; serosang, straw colored drainage noted; soaking through dressing onto pt gown; new dressing applied 4X4 gauze and abd pad; will cont. To monitor.

## 2012-10-01 NOTE — Progress Notes (Signed)
Occupational Therapy Treatment Patient Details Name: SHERMAN LIPUMA MRN: [id number] DOB: 05/08/1956 Today's Date: 10/01/2012 Time: 1050-1106 OT Time Calculation (min): 16 min  OT Assessment / Plan / Recommendation Comments on Treatment Session Pt progressing nicely toward POC/goals. Pt currently Mod I grooming and toileting standing unsupported at sink. Discussed energy conservation/rest breaks PRN. Recommend OT assess LB bathing/dressing w/ A/E & tub/shower transfer next visit as able.    Follow Up Recommendations       Barriers to Discharge       Equipment Recommendations  None recommended by OT    Recommendations for Other Services    Frequency Min 2X/week   Plan Discharge plan remains appropriate    Precautions / Restrictions Precautions Precautions: Fall Restrictions Weight Bearing Restrictions: No   Pertinent Vitals/Pain No c/o Pain    ADL  Eating/Feeding: Performed;Modified independent Where Assessed - Eating/Feeding: Chair Grooming: Performed;Wash/dry hands;Wash/dry face;Modified independent Where Assessed - Grooming: Unsupported standing (at sink level) Upper Body Bathing: Simulated;Chest;Right arm;Left arm;Modified independent Where Assessed - Upper Body Bathing: Unsupported standing;Other (comment) (at sink) Lower Body Bathing: Simulated;Minimal assistance Where Assessed - Lower Body Bathing: Unsupported sit to stand;Unsupported sitting Upper Body Dressing: Performed;Set up;Modified independent Where Assessed - Upper Body Dressing: Unsupported standing;Unsupported sit to stand;Unsupported sitting Lower Body Dressing: Simulated;Minimal assistance Where Assessed - Lower Body Dressing: Unsupported sitting Toilet Transfer: Performed;Independent Toilet Transfer Method: Sit to Barista: Grab bars;Comfort height toilet Toileting - Clothing Manipulation and Hygiene: Performed;Independent Where Assessed - Toileting Clothing Manipulation and  Hygiene: Sit on 3-in-1 or toilet;Sit to stand from 3-in-1 or toilet Tub/Shower Transfer Method: Not assessed Transfers/Ambulation Related to ADLs: Pt amb with Mod I in room for ADL's and toilet transfers ADL Comments: Pt progressing nicely toward POC/goals. Pt currently Mod I grooming and toileting standing unsupported at sink. Discussed energy conservation/rest breaks PRN. Recommend OT assess LB bathing/dressing w/ A/E & tub/shower transfer next visit as able.    OT Diagnosis:    OT Problem List:   OT Treatment Interventions:     OT Goals ADL Goals ADL Goal: Grooming - Progress: Met ADL Goal: Upper Body Bathing - Progress: Met ADL Goal: Upper Body Dressing - Progress: Progressing toward goals ADL Goal: Lower Body Dressing - Progress: Progressing toward goals ADL Goal: Toilet Transfer - Progress: Met ADL Goal: Toileting - Clothing Manipulation - Progress: Met  Visit Information  Last OT Received On: 10/01/12    Subjective Data  Subjective: I have been walking into the bathroom by myself Patient Stated Goal: Increase I for return to home w/ family assist   Prior Functioning       Cognition  Overall Cognitive Status: Appears within functional limits for tasks assessed/performed Arousal/Alertness: Awake/alert Orientation Level: Appears intact for tasks assessed Behavior During Session: [facility] for tasks performed    Mobility  Shoulder Instructions Bed Mobility Bed Mobility: Not assessed (Pt up in chair ) Transfers Transfers: Sit to Stand;Stand to Sit Sit to Stand: 7: Independent;From chair/3-in-1;From toilet;With armrests Stand to Sit: 7: Independent;To chair/3-in-1;To toilet;With armrests          Balance Static Standing Balance Static Standing - Balance Support: No upper extremity supported;During functional activity Static Standing - Level of Assistance: 7: Independent Static Standing - Comment/# of Minutes: Standing at sink level for grooming and ADL this am x5-36min w/o  LOB Dynamic Standing Balance Dynamic Standing - Balance Support: During functional activity;No upper extremity supported Dynamic Standing - Level of Assistance: 6: Modified independent (Device/Increase time) Dynamic  Standing - Balance Activities: Other (comment) (ADL's standing at sink)   End of Session OT - End of Session Activity Tolerance: Patient tolerated treatment well Patient left: in chair;with call bell/phone within reach  GO     Roselie Awkward Dixon 10/01/2012, 11:19 AM

## 2012-10-01 NOTE — Telephone Encounter (Signed)
Message copied by Rosalyn Charters on Tue Oct 01, 2012  9:55 AM ------      Message from: Melene Plan      Created: Mon Sep 30, 2012  4:53 PM                   ----- Message -----         From: Marlowe Shores, [place]         Sent: 09/30/2012   2:50 PM           To: Melene Plan, RN            2 weeks- mesenteric bypass - has staples

## 2012-10-01 NOTE — Progress Notes (Signed)
Physical Therapy Treatment Patient Details Name: Dorothy [name] MRN: [id number] DOB: 1956/07/01 Today's Date: 10/01/2012 Time: 7829-5621 PT Time Calculation (min): 14 min  PT Assessment / Plan / Recommendation Comments on Treatment Session  Pt s/p mesenteric and celiac artery bypass on 09/25/12 with h/o bilat. THA Nov/Dec 2012. Pt presents today sitting up in chair and reports walking around the unit many times a day.  DGI score of 19/24 suggests she is at a slight risk of falls, will continue to follow pt to address balance and gait.      Follow Up Recommendations  Home health PT           Equipment Recommendations  None recommended by PT       Frequency Min 3X/week   Plan Discharge plan needs to be updated;Frequency remains appropriate    Precautions / Restrictions Precautions Precautions: Fall Restrictions Weight Bearing Restrictions: No   Pertinent Vitals/Pain VSS, Denies pain.    Mobility  Bed Mobility Bed Mobility: Not assessed Transfers Transfers: Sit to Stand;Stand to Sit Sit to Stand: 7: Independent;From chair/3-in-1;With upper extremity assist;With armrests Stand to Sit: 7: Independent;To chair/3-in-1;With armrests Ambulation/Gait Ambulation/Gait Assistance: 6: Modified independent (Device/Increase time) Ambulation Distance (Feet): 400 Feet Assistive device: None Ambulation/Gait Assistance Details: Slow gait, without lob, able to carry conversation and turn head to survey environment Gait Pattern: Step-through pattern;Decreased stride length Gait velocity: decreased Stairs: Yes Stairs Assistance: 6: Modified independent (Device/Increase time) Stair Management Technique: One rail Right;Step to pattern Number of Stairs: 10  Wheelchair Mobility Wheelchair Mobility: No      PT Goals Acute Rehab PT Goals PT Goal: Supine/Side to Sit - Progress: Progressing toward goal PT Goal: Sit at [place] Of Bed - Progress: Progressing toward goal PT Goal: Sit to  Supine/Side - Progress: Progressing toward goal PT Goal: Sit to Stand - Progress: Met PT Goal: Stand to Sit - Progress: Met PT Goal: Ambulate - Progress: Met Pt will Go Up / Down Stairs: 3-5 stairs;with modified independence;with rail(s) PT Goal: Up/Down Stairs - Progress: Met Additional Goals Additional Goal #1: Will score >21 on DGI. PT Goal: Additional Goal #1 - Progress: Goal set today  Visit Information  Last PT Received On: 10/01/12 Assistance Needed: +1    Subjective Data  Subjective: "I'm doing well, I've been walking around this place." Patient Stated Goal: To go home   Cognition  Overall Cognitive Status: Appears within functional limits for tasks assessed/performed Arousal/Alertness: Awake/alert Orientation Level: Oriented X4 / Intact Behavior During Session: [facility] for tasks performed    Balance  Static Standing Balance Static Standing - Balance Support: No upper extremity supported;During functional activity Static Standing - Level of Assistance: 7: Independent Static Standing - Comment/# of Minutes: Standing at sink level for grooming and ADL this am x5-67min w/o LOB Dynamic Standing Balance Dynamic Standing - Balance Support: During functional activity;No upper extremity supported Dynamic Standing - Level of Assistance: 6: Modified independent (Device/Increase time) Dynamic Standing - Balance Activities: Other (comment) (ADL's standing at sink) Standardized Balance Assessment Standardized Balance Assessment: Dynamic Gait Index Dynamic Gait Index Level Surface: Mild Impairment Change in Gait Speed: Mild Impairment Gait with Horizontal Head Turns: Normal Gait with Vertical Head Turns: Normal Gait and Pivot Turn: Normal Step Over Obstacle: Mild Impairment Step Around Obstacles: Mild Impairment Steps: Mild Impairment Total Score: 19   End of Session PT - End of Session Activity Tolerance: Patient tolerated treatment well Patient left: in chair;with call  bell/phone within reach (With OT in the  room) Nurse Communication: Mobility status       Sharion Balloon 10/01/2012, 11:31 AM Sharion Balloon, SPT Acute Rehab Services [phone]

## 2012-10-01 NOTE — Progress Notes (Signed)
Abdominal dressing with moderate amount of serosang, straw colored drainage; dressing changed at this time; will cont. To monitor.

## 2012-10-01 NOTE — Progress Notes (Signed)
Abdominal incision draining serosang at umbilical site; dressing applied; will cont. To monitor.

## 2012-10-01 NOTE — Progress Notes (Signed)
Dr. Darrick Penna in to see pt; MD made aware of drainage from incision; will cont. To monitor.

## 2012-10-02 ENCOUNTER — Encounter (HOSPITAL_COMMUNITY): Payer: Self-pay | Admitting: Anesthesiology

## 2012-10-02 ENCOUNTER — Telehealth: Payer: Self-pay | Admitting: Vascular Surgery

## 2012-10-02 ENCOUNTER — Encounter (HOSPITAL_COMMUNITY)
Admission: RE | Disposition: A | Discharge status: Home / Self Care | Payer: Self-pay | Source: Ambulatory Visit | Attending: Vascular Surgery

## 2012-10-02 ENCOUNTER — Inpatient Hospital Stay (HOSPITAL_COMMUNITY): Payer: BC Managed Care – PPO | Admitting: Anesthesiology

## 2012-10-02 DIAGNOSIS — IMO0002 Reserved for concepts with insufficient information to code with codable children: Secondary | ICD-10-CM

## 2012-10-02 HISTORY — PX: WOUND EXPLORATION: SHX6188

## 2012-10-02 LAB — CBC
Hemoglobin: 10.7 g/dL — ABNORMAL LOW (ref 12.0–15.0)
MCH: 27.4 pg (ref 26.0–34.0)
MCV: 74.4 fL — ABNORMAL LOW (ref 78.0–100.0)
RBC: 3.91 MIL/uL (ref 3.87–5.11)

## 2012-10-02 LAB — BASIC METABOLIC PANEL
BUN: 7 mg/dL (ref 6–23)
Chloride: 107 mEq/L (ref 96–112)
GFR calc Af Amer: >90 mL/min (ref >90)
GFR calc non Af Amer: >90 mL/min (ref >90)

## 2012-10-02 SURGERY — WOUND EXPLORATION
Anesthesia: General | Site: Abdomen | Wound class: Clean

## 2012-10-02 MED ORDER — HYDROMORPHONE HCL PF 1 MG/ML IJ SOLN: 0.2500 mg | INTRAMUSCULAR | Status: DC | PRN

## 2012-10-02 MED ORDER — ONDANSETRON HCL 4 MG/2ML IJ SOLN
INTRAMUSCULAR | Status: DC | PRN
  Administered 2012-10-02: 4 mg via INTRAVENOUS

## 2012-10-02 MED ORDER — LACTATED RINGERS IV SOLN
INTRAVENOUS | Status: DC | PRN
  Administered 2012-10-02: 09:00:00 via INTRAVENOUS

## 2012-10-02 MED ORDER — OXYCODONE HCL 5 MG/5ML PO SOLN: 5.0000 mg | Freq: Once | ORAL | Status: DC | PRN

## 2012-10-02 MED ORDER — CEFAZOLIN SODIUM-DEXTROSE 2-3 GM-% IV SOLR
INTRAVENOUS | Status: DC | PRN
  Administered 2012-10-02: 2 g via INTRAVENOUS

## 2012-10-02 MED ORDER — ARTIFICIAL TEARS OP OINT
TOPICAL_OINTMENT | OPHTHALMIC | Status: DC | PRN
  Administered 2012-10-02: 1 via OPHTHALMIC

## 2012-10-02 MED ORDER — 0.9 % SODIUM CHLORIDE (POUR BTL) OPTIME
TOPICAL | Status: DC | PRN
  Administered 2012-10-02: 1000 mL

## 2012-10-02 MED ORDER — PROPOFOL 10 MG/ML IV BOLUS
INTRAVENOUS | Status: DC | PRN
  Administered 2012-10-02: 120 mg via INTRAVENOUS

## 2012-10-02 MED ORDER — OXYCODONE HCL 5 MG PO TABS: 5.0000 mg | ORAL_TABLET | Freq: Once | ORAL | Status: DC | PRN

## 2012-10-02 MED ORDER — POTASSIUM CHLORIDE 10 MEQ/100ML IV SOLN
10.0000 meq | INTRAVENOUS | Status: AC
  Administered 2012-10-02 (×6): 10 meq via INTRAVENOUS
  Filled 2012-10-02 (×6): qty 100

## 2012-10-02 MED ORDER — FENTANYL CITRATE 0.05 MG/ML IJ SOLN
INTRAMUSCULAR | Status: DC | PRN
  Administered 2012-10-02 (×4): 50 ug via INTRAVENOUS

## 2012-10-02 MED ORDER — CEFAZOLIN SODIUM 1-5 GM-% IV SOLN
INTRAVENOUS | Status: AC
  Filled 2012-10-02: qty 100

## 2012-10-02 MED ORDER — LIDOCAINE HCL (CARDIAC) 20 MG/ML IV SOLN
INTRAVENOUS | Status: DC | PRN
  Administered 2012-10-02: 70 mg via INTRAVENOUS

## 2012-10-02 MED ORDER — NEOSTIGMINE METHYLSULFATE 1 MG/ML IJ SOLN
INTRAMUSCULAR | Status: DC | PRN
  Administered 2012-10-02: 4 mg via INTRAVENOUS

## 2012-10-02 MED ORDER — GLYCOPYRROLATE 0.2 MG/ML IJ SOLN
INTRAMUSCULAR | Status: DC | PRN
  Administered 2012-10-02: 0.6 mg via INTRAVENOUS

## 2012-10-02 MED ORDER — ROCURONIUM BROMIDE 100 MG/10ML IV SOLN
INTRAVENOUS | Status: DC | PRN
  Administered 2012-10-02: 35 mg via INTRAVENOUS

## 2012-10-02 MED ORDER — MEPERIDINE HCL 25 MG/ML IJ SOLN: 6.2500 mg | INTRAMUSCULAR | Status: DC | PRN

## 2012-10-02 MED ORDER — MIDAZOLAM HCL 5 MG/5ML IJ SOLN
INTRAMUSCULAR | Status: DC | PRN
  Administered 2012-10-02 (×2): 1 mg via INTRAVENOUS

## 2012-10-02 MED ORDER — SODIUM CHLORIDE 0.9 % IV SOLN: INTRAVENOUS | Status: DC

## 2012-10-02 MED ORDER — EPHEDRINE SULFATE 50 MG/ML IJ SOLN
INTRAMUSCULAR | Status: DC | PRN
  Administered 2012-10-02 (×2): 5 mg via INTRAVENOUS

## 2012-10-02 MED ORDER — PROMETHAZINE HCL 25 MG/ML IJ SOLN: 6.2500 mg | INTRAMUSCULAR | Status: DC | PRN

## 2012-10-02 SURGICAL SUPPLY — 34 items
BANDAGE GAUZE ELAST BULKY 4 IN (GAUZE/BANDAGES/DRESSINGS) IMPLANT
CANISTER SUCTION 2500CC (MISCELLANEOUS) ×2 IMPLANT
CLOTH BEACON ORANGE TIMEOUT ST (SAFETY) ×2 IMPLANT
COVER SURGICAL LIGHT HANDLE (MISCELLANEOUS) ×2 IMPLANT
DRAPE INCISE IOBAN 66X45 STRL (DRAPES) ×4 IMPLANT
DRSG COVADERM 4X10 (GAUZE/BANDAGES/DRESSINGS) ×2 IMPLANT
DRSG COVADERM 4X14 (GAUZE/BANDAGES/DRESSINGS) ×2 IMPLANT
ELECT REM PT RETURN 9FT ADLT (ELECTROSURGICAL) ×2
ELECTRODE REM PT RTRN 9FT ADLT (ELECTROSURGICAL) ×1 IMPLANT
GLOVE BIO SURGEON STRL SZ 6.5 (GLOVE) ×4 IMPLANT
GLOVE BIO SURGEON STRL SZ7.5 (GLOVE) ×2 IMPLANT
GLOVE BIOGEL PI IND STRL 7.5 (GLOVE) ×2 IMPLANT
GLOVE BIOGEL PI INDICATOR 7.5 (GLOVE) ×2
GLOVE SURG SS PI 7.5 STRL IVOR (GLOVE) ×2 IMPLANT
GOWN PREVENTION PLUS XLARGE (GOWN DISPOSABLE) ×4 IMPLANT
GOWN STRL NON-REIN LRG LVL3 (GOWN DISPOSABLE) ×4 IMPLANT
KIT BASIN OR (CUSTOM PROCEDURE TRAY) ×2 IMPLANT
KIT REMOVER STAPLE SKIN (MISCELLANEOUS) ×2 IMPLANT
KIT ROOM TURNOVER OR (KITS) ×2 IMPLANT
NS IRRIG 1000ML POUR BTL (IV SOLUTION) ×2 IMPLANT
PACK GENERAL/GYN (CUSTOM PROCEDURE TRAY) ×2 IMPLANT
PACK UNIVERSAL I (CUSTOM PROCEDURE TRAY) ×2 IMPLANT
PAD ARMBOARD 7.5X6 YLW CONV (MISCELLANEOUS) ×4 IMPLANT
SPONGE GAUZE 4X4 12PLY (GAUZE/BANDAGES/DRESSINGS) ×2 IMPLANT
STAPLER VISISTAT 35W (STAPLE) IMPLANT
SUT ETHILON 3 0 PS 1 (SUTURE) IMPLANT
SUT PDS AB 1 TP1 54 (SUTURE) ×4 IMPLANT
SUT VIC AB 2-0 CTX 36 (SUTURE) IMPLANT
SUT VIC AB 3-0 SH 27 (SUTURE)
SUT VIC AB 3-0 SH 27X BRD (SUTURE) IMPLANT
SUT VICRYL 4-0 PS2 18IN ABS (SUTURE) IMPLANT
TOWEL OR 17X24 6PK STRL BLUE (TOWEL DISPOSABLE) ×2 IMPLANT
TOWEL OR 17X26 10 PK STRL BLUE (TOWEL DISPOSABLE) ×2 IMPLANT
WATER STERILE IRR 1000ML POUR (IV SOLUTION) ×2 IMPLANT

## 2012-10-02 NOTE — Progress Notes (Addendum)
[facility] [place]  Post-op  Intra-abdominal Surgery note  Date of Surgery: 09/25/2012  Surgeon(s): Sherren Kerns, MD  7 Days Post-Op Procedure(s): MESENTERIC ARTERY BYPASS CELIAC ARTERY BYPASS  History of Present Illness  Dorothy [name] is a 56 y.o. female who is  up s/p Procedure(s): MESENTERIC ARTERY BYPASS CELIAC ARTERY BYPASS  Pt is doing well overall. Still having drainage from upper portion of wound   Significant Diagnostic Studies: CBC Lab Results  Component Value Date   WBC 13.8* 10/02/2012   HGB 10.7* 10/02/2012   HCT 29.1* 10/02/2012   MCV 74.4* 10/02/2012   PLT 177 10/02/2012    BMET    Component Value Date/Time   NA 142 10/02/2012 0408   K 2.4* 10/02/2012 0408   CL 107 10/02/2012 0408   CO2 25 10/02/2012 0408   GLUCOSE 83 10/02/2012 0408   BUN 7 10/02/2012 0408   CREATININE 0.67 10/02/2012 0408   CREATININE 0.62 07/10/2012 1113   CALCIUM 8.1* 10/02/2012 0408   GFRNONAA >90 10/02/2012 0408   GFRAA >90 10/02/2012 0408    COAG Lab Results  Component Value Date   INR 1.30 09/25/2012   INR 0.95 09/25/2012   INR SPECIMEN CLOTTED 09/13/2012   No results found for this basename: PTT    I/O last 3 completed shifts: In: 1080 [P.O.:1080] Out: -     Physical Examination BP Readings from Last 3 Encounters:  10/02/12 148/76  10/02/12 148/76  09/13/12 138/88   Temp Readings from Last 3 Encounters:  10/02/12 98.3 F (36.8 C) Oral  10/02/12 98.3 F (36.8 C) Oral  09/13/12 97.7 F (36.5 C)    SpO2 Readings from Last 3 Encounters:  10/02/12 97%  10/02/12 97%  09/13/12 98%   Pulse Readings from Last 3 Encounters:  10/02/12 60  10/02/12 60  09/13/12 71    General: A&O x 3, WDWN female in NAD Pulmonary: normal non-labored breathing , without Rales, rhonchi,  wheezing Cardiac: Heart rate : regular ,  Abdomen:abdomen soft and non-tender  Abdominal wound: draining with pocket of fluctuance just superior to  umbilicus  Neurologic: A&O X 3; Appropriate Affect ; SENSATION: normal; MOTOR FUNCTION:  moving all extremities equally. Speech is fluent/normal Vascular Exam:BLE warm  Extremities without ischemic changes, no Gangrene, no cellulitis; no open wounds;   Assessment/Plan: Dorothy [name] is a 56 y.o. female who is 7 Days Post-Op MESENTERIC ARTERY BYPASS CELIAC ARTERY BYPASS To OR this afternoon to explore abd. incision Hypokalemia - replace HIT panel NEG Recheck labs in am   Marlowe Shores [phone] 10/02/2012 8:19 AM

## 2012-10-02 NOTE — Progress Notes (Signed)
CRITICAL VALUE ALERT  Critical value received:  K 2.4  Date of notification:  10/02/2012   Time of notification:  0600  Critical value read back:yes  Nurse who received alert:  Lavance Beazer, Avie Echevaria , RN  MD notified (1st page):  Dr. Myra Gianotti  Time of first page:  [phone]  MD notified (2nd page):  Time of second page:  Responding MD:  Dr. Myra Gianotti  Time MD responded:  6:55 AM

## 2012-10-02 NOTE — OR Nursing (Signed)
2nd of 6 runs K+ in progress

## 2012-10-02 NOTE — Progress Notes (Signed)
Still with large amount of serous straw colored fluid from wound.  The wound today has some fluctuance to it but no erythema.  Most likely this represents a seroma but will explore in OR to rule out fascia defect.  Hypokalemic- will replete  Filed Vitals:   10/01/12 1332 10/01/12 2036 10/02/12 0328 10/02/12 0837  BP: 135/53 125/65 148/76 150/78  Pulse: 59 66 60 57  Temp: 98.4 F (36.9 C) 98.8 F (37.1 C) 98.3 F (36.8 C) 97.6 F (36.4 C)  TempSrc: Oral Oral Oral Oral  Resp: 18 18 18 16   Height:      Weight:      SpO2: 96% 98% 97% 97%    Fabienne Bruns, MD [facility] [place] Office: [phone] Pager: [phone]

## 2012-10-02 NOTE — Op Note (Signed)
Procedure: Exploration of abdominal wound  Preoperative diagnosis: Abdominal wound seroma  Postoperative diagnosis: Same  Anesthesia: Gen.  Assistant: Doreatha Massed PA-C  Operative findings: No obvious infection, no obvious fascial defect, wound cultures sent  Operative details: After obtaining informed consent, the patient was taken to the operating room. The patient was placed in supine position on the operating room table. After induction of general anesthesia the patient's abdomen was prepped and draped in the usual sterile fashion from the nipples to the upper thighs. Her pre-existing laparotomy incision was reopened by removing all staples.  There was clear fluid straw in color in the wound but no obvious focal fluid collection.  This was cultured. The fascia was inspected and there was no obvious defect throughout the entire fascial closure. However I was able to snug up the fascial closure by about 1 cm. One additional #1 PDS suture was placed in order to tighten the entire suture line. Next the wound was thoroughly irrigated with normal saline solution. The skin was closed with staples. The patient tolerated the procedure well and there were no complications. The patient was extubated in the operating room and taken the recovery room in stable condition. Instrument sponge and needle counts were correct at the end of the case.  Fabienne Bruns, MD [facility] [place] Office: [phone] Pager: [phone]

## 2012-10-02 NOTE — Anesthesia Procedure Notes (Signed)
Procedure Name: Intubation Date/Time: 10/02/2012 9:40 AM Performed by: Gayla Medicus Pre-anesthesia Checklist: Patient identified, Timeout performed, Emergency Drugs available, Suction available and Patient being monitored Patient Re-evaluated:Patient Re-evaluated prior to inductionOxygen Delivery Method: Circle system utilized Preoxygenation: Pre-oxygenation with 100% oxygen Intubation Type: IV induction Ventilation: Mask ventilation without difficulty Laryngoscope Size: Mac and 3 Grade View: Grade I Tube type: Oral Tube size: 7.5 mm Number of attempts: 1 Airway Equipment and Method: Stylet Placement Confirmation: ETT inserted through vocal cords under direct vision,  positive ETCO2 and breath sounds checked- equal and bilateral Secured at: 22 cm Tube secured with: Tape Dental Injury: Teeth and Oropharynx as per pre-operative assessment

## 2012-10-02 NOTE — Transfer of Care (Signed)
Immediate Anesthesia Transfer of Care Note  Patient: Dorothy [name]  Procedure(s) Performed: Procedure(s) (LRB) with comments: WOUND EXPLORATION (N/A) - abdominal wound exploration  Patient Location: PACU  Anesthesia Type:General  Level of Consciousness: awake, alert  and oriented  Airway & Oxygen Therapy: Patient Spontanous Breathing and Patient connected to nasal cannula oxygen  Post-op Assessment: Report given to PACU RN, Post -op Vital signs reviewed and stable and Patient moving all extremities X 4  Post vital signs: Reviewed and stable  Complications: No apparent anesthesia complications

## 2012-10-02 NOTE — Anesthesia Preprocedure Evaluation (Signed)
Anesthesia Evaluation  Patient identified by MRN, date of birth, ID band Patient awake    Reviewed: Allergy & Precautions, H&P , NPO status , Patient's Chart, lab work & pertinent test results  History of Anesthesia Complications Negative for: history of anesthetic complications  Airway Mallampati: II TM Distance: >3 FB Neck ROM: Full    Dental  (+) Teeth Intact and Dental Advisory Given   Pulmonary pneumonia -, resolved, Current Smoker,    Pulmonary exam normal       Cardiovascular hypertension, Pt. on medications + Peripheral Vascular Disease  EF=58% by EST   Neuro/Psych PSYCHIATRIC DISORDERS negative neurological ROS     GI/Hepatic negative GI ROS, Neg liver ROS,   Endo/Other  Hypothyroidism   Renal/GU      Musculoskeletal   Abdominal   Peds  Hematology negative hematology ROS (+)   Anesthesia Other Findings   Reproductive/Obstetrics                           Anesthesia Physical Anesthesia Plan  ASA: III  Anesthesia Plan: General ETT   Post-op Pain Management:    Induction:   Airway Management Planned: Oral ETT  Additional Equipment:   Intra-op Plan:   Post-operative Plan: Extubation in OR  Informed Consent: I have reviewed the patients History and Physical, chart, labs and discussed the procedure including the risks, benefits and alternatives for the proposed anesthesia with the patient or authorized representative who has indicated his/her understanding and acceptance.   Dental advisory given  Plan Discussed with: Surgeon  Anesthesia Plan Comments:         Anesthesia Quick Evaluation

## 2012-10-02 NOTE — Anesthesia Postprocedure Evaluation (Signed)
  Anesthesia Post-op Note  Patient: Dorothy [name]  Procedure(s) Performed: Procedure(s) (LRB) with comments: WOUND EXPLORATION (N/A) - abdominal wound exploration  Patient Location: PACU  Anesthesia Type:General  Level of Consciousness: awake  Airway and Oxygen Therapy: Patient Spontanous Breathing  Post-op Pain: mild  Post-op Assessment: Post-op Vital signs reviewed  Post-op Vital Signs: stable  Complications: No apparent anesthesia complications

## 2012-10-02 NOTE — Telephone Encounter (Signed)
Message copied by Rosalyn Charters on Wed Oct 02, 2012 11:53 AM ------      Message from: [place], [place] K      Created: Wed Oct 02, 2012 10:38 AM      Regarding: schedule                   ----- Message -----         From: Dara Lords, PA         Sent: 10/02/2012  10:17 AM           To: Sharee Pimple, CMA            She is s/p wound exploration 10/02/12.  She needs to f/u 2 weeks with Dr. Darrick Penna from today instead of previous date.  She will need staples removed.            Thanks,      Lelon Mast

## 2012-10-02 NOTE — Preoperative (Signed)
Beta Blockers   Reason not to administer Beta Blockers: not prescribed 

## 2012-10-03 LAB — COMPREHENSIVE METABOLIC PANEL
ALT: 107 U/L — ABNORMAL HIGH (ref 0–35)
AST: 21 U/L (ref 0–37)
Alkaline Phosphatase: 70 U/L (ref 39–117)
CO2: 27 mEq/L (ref 19–32)
Calcium: 8 mg/dL — ABNORMAL LOW (ref 8.4–10.5)
Chloride: 108 mEq/L (ref 96–112)
GFR calc Af Amer: >90 mL/min (ref >90)
GFR calc non Af Amer: >90 mL/min (ref >90)
Glucose, Bld: 84 mg/dL (ref 70–99)
Sodium: 144 mEq/L (ref 135–145)
Total Bilirubin: 0.4 mg/dL (ref 0.3–1.2)

## 2012-10-03 MED ORDER — OXYCODONE-ACETAMINOPHEN 5-325 MG PO TABS: 1.0000 | ORAL_TABLET | ORAL | Status: DC | PRN

## 2012-10-03 MED ORDER — POTASSIUM CHLORIDE CRYS ER 20 MEQ PO TBCR: 80.0000 meq | EXTENDED_RELEASE_TABLET | Freq: Once | ORAL | Status: DC

## 2012-10-03 MED ORDER — POTASSIUM CHLORIDE CRYS ER 20 MEQ PO TBCR
40.0000 meq | EXTENDED_RELEASE_TABLET | ORAL | Status: AC
  Administered 2012-10-03 (×2): 40 meq via ORAL
  Filled 2012-10-03 (×2): qty 2

## 2012-10-03 MED ORDER — POTASSIUM CHLORIDE CRYS ER 20 MEQ PO TBCR: 20.0000 meq | EXTENDED_RELEASE_TABLET | Freq: Two times a day (BID) | ORAL | Status: DC

## 2012-10-03 NOTE — Progress Notes (Signed)
[name],PT Acute Rehabilitation [phone] (pager)  

## 2012-10-03 NOTE — Progress Notes (Signed)
[facility] [place]  Subjective  - No complaints, no nausea or vomiting  Objective 115/66 64 98.7 F (37.1 C) (Oral) 18 93%  Intake/Output Summary (Last 24 hours) at 10/03/12 0824 Last data filed at 10/02/12 1350  Gross per 24 hour  Intake   1140 ml  Output      0 ml  Net   1140 ml   Abdomen soft  Assessment/Planning: Doing well, wound looked like seroma yesterday, cultures pending.  Will d/c home today. Hypokalemia replete orally  Patrich Heinze E 10/03/2012 8:24 AM --  Laboratory Lab Results:  Basename 10/02/12 0408 10/01/12 0515  WBC 13.8* 13.1*  HGB 10.7* 11.1*  HCT 29.1* 29.8*  PLT 177 139*   BMET  Basename 10/03/12 0450 10/02/12 0408  NA 144 142  K 2.9* 2.4*  CL 108 107  CO2 27 25  GLUCOSE 84 83  BUN 7 7  CREATININE 0.72 0.67  CALCIUM 8.0* 8.1*    COAG Lab Results  Component Value Date   INR 1.30 09/25/2012   INR 0.95 09/25/2012   INR SPECIMEN CLOTTED 09/13/2012   No results found for this basename: PTT    Antibiotics Anti-infectives     Start     Dose/Rate Route Frequency Ordered Stop   09/26/12 0500   cefUROXime (ZINACEF) 1.5 g in dextrose 5 % 50 mL IVPB        1.5 g 100 mL/hr over 30 Minutes Intravenous Every 12 hours 09/25/12 1927 09/26/12 1808   09/24/12 1435   cefUROXime (ZINACEF) 1.5 g in dextrose 5 % 50 mL IVPB        1.5 g 100 mL/hr over 30 Minutes Intravenous 30 min pre-op 09/24/12 1435 09/25/12 1702

## 2012-10-03 NOTE — Progress Notes (Signed)
Physical Therapy Treatment Patient Details Name: Dorothy [name] MRN: [id number] DOB: 1955-10-25 Today's Date: 10/03/2012 Time: 6578-4696 PT Time Calculation (min): 11 min  PT Assessment / Plan / Recommendation Comments on Treatment Session  Pt s/p mesenteric and celiac artery bypass on 09/25/12 with h/o bilat. THA Nov/Dec 2012. Pt states she is doing well and her son is on his way to bring her home.  Discussed balance and gait concerns and need for continued therapy at home.  Pt explains she feels good and does not want HHPT but agrees to Outpatient PT as needed.      Follow Up Recommendations  Outpatient PT           Equipment Recommendations  None recommended by PT       Frequency Min 3X/week   Plan Discharge plan needs to be updated;Frequency remains appropriate    Precautions / Restrictions Precautions Precautions: Fall Restrictions Weight Bearing Restrictions: No   Pertinent Vitals/Pain VSS, no pain.     Mobility  Bed Mobility Bed Mobility: Not assessed Transfers Transfers: Sit to Stand;Stand to Sit Sit to Stand: 7: Independent Stand to Sit: 7: Independent Details for Transfer Assistance: Moves easily in chair and sit/stand without difficulty Ambulation/Gait Ambulation/Gait Assistance: 6: Modified independent (Device/Increase time) Ambulation Distance (Feet): 300 Feet Assistive device: None Ambulation/Gait Assistance Details: Pt with decr gait velocity but without difficulties Gait Pattern: Step-through pattern;Decreased stride length Gait velocity: decreased Stairs: No Wheelchair Mobility Wheelchair Mobility: No    Exercises Other Exercises Other Exercises: Educated on HEP with return demonstrate x3: SLS and mini squats, dynamic gait (head turns)    PT Goals Acute Rehab PT Goals PT Goal: Sit at Edge Of Bed - Progress: Met PT Goal: Sit to Stand - Progress: Met PT Goal: Stand to Sit - Progress: Met PT Goal: Ambulate - Progress: Met PT Goal: Up/Down  Stairs - Progress: Met Additional Goals PT Goal: Additional Goal #1 - Progress: Progressing toward goal  Visit Information  Last PT Received On: 10/03/12 Assistance Needed: +1    Subjective Data  Subjective: I'm set to go home today. Patient Stated Goal: To go home   Cognition  Overall Cognitive Status: Appears within functional limits for tasks assessed/performed Arousal/Alertness: Awake/alert Orientation Level: Appears intact for tasks assessed Behavior During Session: [facility] for tasks performed    Balance  Balance Balance Assessed: Yes Static Sitting Balance Static Sitting - Balance Support: No upper extremity supported;Feet supported Static Sitting - Level of Assistance: 7: Independent Static Standing Balance Static Standing - Balance Support: No upper extremity supported Static Standing - Level of Assistance: 7: Independent Static Standing - Comment/# of Minutes: no difficulties  Single Leg Stance - Right Leg:  (>10s without lob) Single Leg Stance - Left Leg:  (>10s without lob) Tandem Stance - Right Leg:  (mild difficulty, held >5s) Tandem Stance - Left Leg:  (mild difficulty, held >5s)  End of Session PT - End of Session Activity Tolerance: Patient tolerated treatment well Patient left: in chair;with call bell/phone within reach Nurse Communication: Mobility status       Sharion Balloon 10/03/2012, 9:46 AM Sharion Balloon, SPT Acute Rehab Services [phone]

## 2012-10-04 ENCOUNTER — Encounter (HOSPITAL_COMMUNITY): Payer: Self-pay | Admitting: Vascular Surgery

## 2012-10-04 LAB — WOUND CULTURE
Culture: NO GROWTH
Gram Stain: NONE SEEN

## 2012-10-08 ENCOUNTER — Telehealth: Payer: Self-pay

## 2012-10-08 DIAGNOSIS — M62830 Muscle spasm of back: Secondary | ICD-10-CM

## 2012-10-08 MED ORDER — CYCLOBENZAPRINE HCL 5 MG PO TABS: ORAL_TABLET | ORAL | Status: DC

## 2012-10-08 NOTE — Telephone Encounter (Signed)
Phone call from pt.  C/o muscle spasms in back that come and go.  States the spasms occur both with laying and sitting.  Denies any other symptoms.  Denies any fever / chills, incisional separation/drainage/redness,difficulty with urination, or difficulty with bowel movements.  Discussed with Azalia Bilis, NP.  Recommended Flexeril 5 mg. 1 tab bid/prn, # 30; RF x1.  Pt. Advised of new Rx to be sent to pharmacy.  Advised to call office if worsening symptoms.  Verb. Understanding.

## 2012-10-10 ENCOUNTER — Ambulatory Visit: Payer: BC Managed Care – PPO | Admitting: Vascular Surgery

## 2012-10-10 ENCOUNTER — Encounter: Payer: Self-pay | Admitting: Internal Medicine

## 2012-10-17 ENCOUNTER — Encounter: Payer: Self-pay | Admitting: Neurosurgery

## 2012-10-18 ENCOUNTER — Encounter: Payer: Self-pay | Admitting: Neurosurgery

## 2012-10-18 ENCOUNTER — Ambulatory Visit (INDEPENDENT_AMBULATORY_CARE_PROVIDER_SITE_OTHER): Payer: BC Managed Care – PPO | Admitting: Neurosurgery

## 2012-10-18 VITALS — BP 104/82 | HR 72 | Resp 16 | Ht 67.5 in | Wt 156.8 lb

## 2012-10-18 DIAGNOSIS — K55059 Acute (reversible) ischemia of intestine, part and extent unspecified: Secondary | ICD-10-CM

## 2012-10-18 DIAGNOSIS — K551 Chronic vascular disorders of intestine: Secondary | ICD-10-CM | POA: Insufficient documentation

## 2012-10-18 NOTE — Progress Notes (Signed)
Subjective:     Patient ID: Dorothy [name], female   DOB: January 20, 1956, 56 y.o.   MRN: [id number]  HPI: 56-year-old female patient of Dr. 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 This was done in December. The patient comes in today for staple removal and has no complaints of pain but did have a few questions about her weight loss. She and her son states she has lost about 30 pounds and I explained to them that this is partly due to the manipulation of the bowel during the procedures as well as anesthesia and they stated understanding. The patient was encouraged to use Ensure drinks and high-protein diet to help with wound healing.    Review of Systems: 12 point review of systems is notable for the difficulties described above otherwise unremarkable     Objective:   Physical Exam: Afebrile, vital signs are stable, wound is well healed the staples will be removed, the patient has good lower extremity pulses.     Assessment:     Assessment as above    Plan:     Again the patient was instructed to use a high-protein diet, to keep the wound clean and apply no powder or creams. The patient's staples were removed without difficulty there was no open areas along the incision line and no drainage. The patient will followup in one month with Dr. Darrick Penna. The patient's questions were encouraged and answered, she is in agreement with this plan. The patient was also given an excuse to be excused from jury duty until spring of 2014.   Lauree Chandler ANP  Clinic M.D.: Imogene Burn

## 2012-10-22 ENCOUNTER — Encounter: Payer: Self-pay | Admitting: Internal Medicine

## 2012-10-22 ENCOUNTER — Telehealth: Payer: Self-pay | Admitting: Urgent Care

## 2012-10-22 NOTE — Telephone Encounter (Signed)
Please call pt to arrange FU OV w/ RMR ONLY 1st available re: FU mesenteric ischemia, abdominal pain Thanks

## 2012-10-22 NOTE — Telephone Encounter (Signed)
Pt is aware of OV on 2/14 at 0800 with RMR and appt card was mailed

## 2012-10-30 LAB — FUNGUS CULTURE W SMEAR

## 2012-11-04 ENCOUNTER — Other Ambulatory Visit: Payer: Self-pay | Admitting: Obstetrics and Gynecology

## 2012-11-04 DIAGNOSIS — Z1231 Encounter for screening mammogram for malignant neoplasm of breast: Secondary | ICD-10-CM

## 2012-11-14 LAB — AFB CULTURE WITH SMEAR (NOT AT ARMC): Acid Fast Smear: NONE SEEN

## 2012-11-20 ENCOUNTER — Encounter: Payer: Self-pay | Admitting: Vascular Surgery

## 2012-11-21 ENCOUNTER — Encounter: Payer: Self-pay | Admitting: Vascular Surgery

## 2012-11-21 ENCOUNTER — Ambulatory Visit (INDEPENDENT_AMBULATORY_CARE_PROVIDER_SITE_OTHER): Payer: BC Managed Care – PPO | Admitting: Vascular Surgery

## 2012-11-21 VITALS — BP 129/77 | HR 64 | Ht 67.5 in | Wt 154.0 lb

## 2012-11-21 DIAGNOSIS — K551 Chronic vascular disorders of intestine: Secondary | ICD-10-CM

## 2012-11-21 DIAGNOSIS — Z48812 Encounter for surgical aftercare following surgery on the circulatory system: Secondary | ICD-10-CM

## 2012-11-21 NOTE — Addendum Note (Signed)
Addended by: Sharee Pimple on: 11/21/2012 02:52 PM   Modules accepted: Orders

## 2012-11-21 NOTE — Progress Notes (Signed)
Patient is a 57 year old female who returns today for followup after recent aorta to celiac and superior mesenteric artery bypass. She states that she has been able to eat more with no abdominal pain. She states that things still don't taste exactly right. She thinks she has gained some weight back. She denies any drainage from her incision.  Exam:  Filed Vitals:   11/21/12 1020  BP: 129/77  Pulse: 64  Height: 5' 7.5" (1.715 m)  Weight: 154 lb (69.854 kg)  SpO2: 100%   Abdomen: Prominent epigastric aortic pulsation, incision well-healed, no hernia, no bruits  Extremities: One plus femoral pulses bilaterally  Assessment: Doing well status post mesenteric reconstruction  Plan: Followup in 3 months with a mesenteric duplex  Fabienne Bruns, MD [facility] [place] Office: [phone] Pager: [phone]

## 2012-12-02 ENCOUNTER — Ambulatory Visit
Admission: RE | Admit: 2012-12-02 | Discharge: 2012-12-02 | Disposition: A | Discharge status: Home / Self Care | Payer: BC Managed Care – PPO | Source: Ambulatory Visit | Attending: Obstetrics and Gynecology | Admitting: Obstetrics and Gynecology

## 2012-12-02 DIAGNOSIS — Z1231 Encounter for screening mammogram for malignant neoplasm of breast: Secondary | ICD-10-CM

## 2012-12-02 LAB — HM MAMMOGRAPHY

## 2012-12-06 ENCOUNTER — Ambulatory Visit: Payer: BC Managed Care – PPO | Admitting: Internal Medicine

## 2013-01-09 ENCOUNTER — Encounter: Payer: Self-pay | Admitting: Internal Medicine

## 2013-02-19 ENCOUNTER — Encounter: Payer: Self-pay | Admitting: Vascular Surgery

## 2013-02-20 ENCOUNTER — Other Ambulatory Visit (INDEPENDENT_AMBULATORY_CARE_PROVIDER_SITE_OTHER): Payer: BC Managed Care – PPO | Admitting: *Deleted

## 2013-02-20 ENCOUNTER — Encounter: Payer: Self-pay | Admitting: Vascular Surgery

## 2013-02-20 ENCOUNTER — Ambulatory Visit (INDEPENDENT_AMBULATORY_CARE_PROVIDER_SITE_OTHER): Payer: BC Managed Care – PPO | Admitting: Vascular Surgery

## 2013-02-20 VITALS — BP 136/73 | HR 60 | Ht 67.5 in | Wt 151.0 lb

## 2013-02-20 DIAGNOSIS — K55059 Acute (reversible) ischemia of intestine, part and extent unspecified: Secondary | ICD-10-CM

## 2013-02-20 DIAGNOSIS — K551 Chronic vascular disorders of intestine: Secondary | ICD-10-CM

## 2013-02-20 DIAGNOSIS — Z48812 Encounter for surgical aftercare following surgery on the circulatory system: Secondary | ICD-10-CM

## 2013-02-20 NOTE — Progress Notes (Signed)
Patient is a 57 year old female who presents for followup today. 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 She states she has regained some weight. She has no food fear. She has no postprandial abdominal pain. She has some occasional pain in her left leg with ambulation but does have a history of chronic back issues as well. Unfortunately she continues to smoke. Greater than 3 minutes they were spent regarding smoking cessation counseling.  Review of systems: She denies chest pain. She denies shortness of breath.  Physical exam:  Filed Vitals:   02/20/13 0936  BP: 136/73  Pulse: 60  Height: 5' 7.5" (1.715 m)  Weight: 151 lb (68.493 kg)  SpO2: 100%   Abdomen: Soft nontender nondistended well-healed midline laparotomy incision without hernia 2+ femoral pulse left 1+ femoral pulse right, no abdominal bruit Extremities: Absent pedal pulses right foot 2+ left posterior tibial pulse Neck: No carotid bruits Chest: Clear to auscultation bilaterally Cardiac: Regular rate and rhythm without murmur  Data: Patient had a mesenteric duplex exam today which showed her bypass graft is patent to the celiac and superior mesenteric artery  Assessment: Patent mesenteric bypass. Plan: Continue to try to quit smoking. Follow up one year.  Fabienne Bruns, MD [facility] [place] Office: [phone] Pager: [phone]

## 2013-02-21 ENCOUNTER — Ambulatory Visit (AMBULATORY_SURGERY_CENTER): Payer: BC Managed Care – PPO | Admitting: *Deleted

## 2013-02-21 ENCOUNTER — Encounter: Payer: Self-pay | Admitting: Internal Medicine

## 2013-02-21 VITALS — Ht 66.5 in | Wt 152.0 lb

## 2013-02-21 DIAGNOSIS — Z1211 Encounter for screening for malignant neoplasm of colon: Secondary | ICD-10-CM

## 2013-02-21 MED ORDER — SUPREP BOWEL PREP KIT 17.5-3.13-1.6 GM/177ML PO SOLN: ORAL | Status: DC

## 2013-03-07 ENCOUNTER — Encounter: Payer: Self-pay | Admitting: Internal Medicine

## 2013-03-07 ENCOUNTER — Ambulatory Visit (AMBULATORY_SURGERY_CENTER): Payer: BC Managed Care – PPO | Admitting: Internal Medicine

## 2013-03-07 VITALS — BP 130/77 | HR 55 | Temp 96.7°F | Resp 22 | Ht 66.5 in | Wt 152.0 lb

## 2013-03-07 DIAGNOSIS — D126 Benign neoplasm of colon, unspecified: Secondary | ICD-10-CM

## 2013-03-07 DIAGNOSIS — Z86010 Personal history of colonic polyps: Secondary | ICD-10-CM

## 2013-03-07 DIAGNOSIS — Z1211 Encounter for screening for malignant neoplasm of colon: Secondary | ICD-10-CM

## 2013-03-07 MED ORDER — SODIUM CHLORIDE 0.9 % IV SOLN: 500.0000 mL | INTRAVENOUS | Status: DC

## 2013-03-07 MED ORDER — DISPOSABLE ENEMA 19-7 GM/118ML RE ENEM
1.0000 | ENEMA | Freq: Once | RECTAL | Status: AC
  Administered 2013-03-07: 1 via RECTAL

## 2013-03-07 NOTE — Progress Notes (Signed)
Pt reported last bm result was a brown liquid.  Per Dr. Leone Payor by Ardeen Jourdain, RN fleet enema x1.  I instructed the pt on how to administer the enema.  I watched the pt administer the enema and she did great.  Instructed to hold the enema as long as she can and pull red cord so we can see the results. Maw

## 2013-03-07 NOTE — Op Note (Signed)
 [facility] [address] [place], [address]   COLONOSCOPY PROCEDURE REPORT  PATIENT: Dorothy, [name]  MR#: [id number] BIRTHDATE: Oct 23, 1956 , 56  yrs. old GENDER: Female ENDOSCOPIST: Iva Boop, MD, [facility] PROCEDURE DATE:  03/07/2013 PROCEDURE:   Colonoscopy with biopsy ASA CLASS:   Class II INDICATIONS:Screening and surveillance,personal history of colonic polyps. MEDICATIONS: Propofol (Diprivan) 240 mg IV, MAC sedation, administered by CRNA, and These medications were titrated to patient response per physician's verbal order  DESCRIPTION OF PROCEDURE:   After the risks benefits and alternatives of the procedure were thoroughly explained, informed consent was obtained.  A digital rectal exam revealed no abnormalities of the rectum.   The [id number]  endoscope was introduced through the anus and advanced to the cecum, which was identified by both the appendix and ileocecal valve. No adverse events experienced.   The quality of the prep was Suprep good  The instrument was then slowly withdrawn as the colon was fully examined.      COLON FINDINGS: A sessile polyp measuring 3 mm in size was found in the rectum.  A polypectomy was performed with cold forceps.  The resection was complete and the polyp tissue was completely retrieved.   The colon mucosa was otherwise normal.   A right colon retroflexion was performed.  Retroflexed views revealed no abnormalities. The time to cecum=3 minutes 13 seconds.  Withdrawal time=11 minutes 09 seconds.  The scope was withdrawn and the procedure completed. COMPLICATIONS: There were no complications.  ENDOSCOPIC IMPRESSION: 1.   Sessile polyp measuring 3 mm in size was found in the rectum; polypectomy was performed with cold forceps 2.   The colon mucosa was otherwise normal - good prep  RECOMMENDATIONS: Timing of repeat colonoscopy will be determined by pathology findings in patient w/ history of  diminutive rectal adenoma 2009   eSigned:  Iva Boop, MD, [facility] 03/07/2013 10:49 AM   cc: Ninfa Linden, NP and The Patient

## 2013-03-07 NOTE — Progress Notes (Signed)
Called to room to assist during endoscopic procedure.  Patient ID and intended procedure confirmed with present staff. Received instructions for my participation in the procedure from the performing physician.  

## 2013-03-07 NOTE — Progress Notes (Signed)
Patient did not experience any of the following events: a burn prior to discharge; a fall within the facility; wrong site/side/patient/procedure/implant event; or a hospital transfer or hospital admission upon discharge from the facility. (G8907) Patient did not have preoperative order for IV antibiotic SSI prophylaxis. (G8918)  

## 2013-03-07 NOTE — Patient Instructions (Addendum)
One tiny rectal polyp was found and removed.  The exam was otherwise normal. I did not see diverticulosis.  I will let you know pathology results and when to have another routine colonoscopy by mail.  I appreciate the opportunity to care for you. Iva Boop, MD, [facility]   Discharge instructions given with verbal understanding. Handout on polyps given. Resume previous medications. YOU HAD AN ENDOSCOPIC PROCEDURE TODAY AT THE [facility]: Refer to the procedure report that was given to you for any specific questions about what was found during the examination.  If the procedure report does not answer your questions, please call your gastroenterologist to clarify.  If you requested that your care partner not be given the details of your procedure findings, then the procedure report has been included in a sealed envelope for you to review at your convenience later.  YOU SHOULD EXPECT: Some feelings of bloating in the abdomen. Passage of more gas than usual.  Walking can help get rid of the air that was put into your GI tract during the procedure and reduce the bloating. If you had a lower endoscopy (such as a colonoscopy or flexible sigmoidoscopy) you may notice spotting of blood in your stool or on the toilet paper. If you underwent a bowel prep for your procedure, then you may not have a normal bowel movement for a few days.  DIET: Your first meal following the procedure should be a light meal and then it is ok to progress to your normal diet.  A half-sandwich or bowl of soup is an example of a good first meal.  Heavy or fried foods are harder to digest and may make you feel nauseous or bloated.  Likewise meals heavy in dairy and vegetables can cause extra gas to form and this can also increase the bloating.  Drink plenty of fluids but you should avoid alcoholic beverages for 24 hours.  ACTIVITY: Your care partner should take you home directly after the procedure.  You should plan to  take it easy, moving slowly for the rest of the day.  You can resume normal activity the day after the procedure however you should NOT DRIVE or use heavy machinery for 24 hours (because of the sedation medicines used during the test).    SYMPTOMS TO REPORT IMMEDIATELY: A gastroenterologist can be reached at any hour.  During normal business hours, 8:30 AM to 5:00 PM Monday through Friday, call [phone].  After hours and on weekends, please call the GI answering service at [phone] who will take a message and have the physician on call contact you.   Following lower endoscopy (colonoscopy or flexible sigmoidoscopy):  Excessive amounts of blood in the stool  Significant tenderness or worsening of abdominal pains  Swelling of the abdomen that is new, acute  Fever of 100F or higher FOLLOW UP: If any biopsies were taken you will be contacted by phone or by letter within the next 1-3 weeks.  Call your gastroenterologist if you have not heard about the biopsies in 3 weeks.  Our staff will call the home number listed on your records the next business day following your procedure to check on you and address any questions or concerns that you may have at that time regarding the information given to you following your procedure. This is a courtesy call and so if there is no answer at the home number and we have not heard from you through the emergency physician on call,  we will assume that you have returned to your regular daily activities without incident.  SIGNATURES/CONFIDENTIALITY: You and/or your care partner have signed paperwork which will be entered into your electronic medical record.  These signatures attest to the fact that that the information above on your After Visit Summary has been reviewed and is understood.  Full responsibility of the confidentiality of this discharge information lies with you and/or your care-partner.

## 2013-03-07 NOTE — Progress Notes (Signed)
[id number] Pt called me to exam enema results.  Clear yellow liquid in toilet bowel. Maw

## 2013-03-10 ENCOUNTER — Telehealth: Payer: Self-pay | Admitting: *Deleted

## 2013-03-10 NOTE — Telephone Encounter (Signed)
Patient apparently unavailable, spoke with "her Aunt" who stated she was fine. Message left to inform patient if she has any questions or concerns to call us.

## 2013-03-13 ENCOUNTER — Encounter: Payer: Self-pay | Admitting: Internal Medicine

## 2013-03-24 ENCOUNTER — Other Ambulatory Visit: Payer: Self-pay | Admitting: *Deleted

## 2013-03-24 MED ORDER — CLONAZEPAM 0.5 MG PO TABS: 0.5000 mg | ORAL_TABLET | Freq: Every day | ORAL | Status: DC

## 2013-03-24 NOTE — Telephone Encounter (Signed)
RX signed and faxed.

## 2013-03-24 NOTE — Telephone Encounter (Signed)
Faxed refill request received from pharmacy for CLONAZEPAM 0.5 MG Last filled by MD on 11/25/12, #135 X 1 Last AEX - 12/11/12 Next AEX - 12/12/13 Please advise refills.  Chart is in your door.  Thanks.

## 2013-03-24 NOTE — Telephone Encounter (Signed)
We changed the dose to 0.5 mg qhs.  So please rx # 90 with one rf.  Thanks.

## 2013-03-24 NOTE — Telephone Encounter (Signed)
RX printed to be signed.

## 2013-10-23 DIAGNOSIS — D072 Carcinoma in situ of vagina: Secondary | ICD-10-CM

## 2013-10-23 HISTORY — DX: Carcinoma in situ of vagina: D07.2

## 2013-11-27 ENCOUNTER — Other Ambulatory Visit: Payer: Self-pay | Admitting: Vascular Surgery

## 2013-12-04 ENCOUNTER — Other Ambulatory Visit: Payer: Self-pay

## 2013-12-04 DIAGNOSIS — Z1231 Encounter for screening mammogram for malignant neoplasm of breast: Secondary | ICD-10-CM

## 2013-12-10 ENCOUNTER — Emergency Department (HOSPITAL_COMMUNITY)
Admission: EM | Admit: 2013-12-10 | Discharge: 2013-12-10 | Disposition: A | Discharge status: Home / Self Care | Payer: 59 | Attending: Emergency Medicine | Admitting: Emergency Medicine

## 2013-12-10 ENCOUNTER — Emergency Department (HOSPITAL_COMMUNITY): Payer: 59

## 2013-12-10 ENCOUNTER — Encounter (HOSPITAL_COMMUNITY): Payer: Self-pay | Admitting: Emergency Medicine

## 2013-12-10 DIAGNOSIS — R059 Cough, unspecified: Secondary | ICD-10-CM | POA: Diagnosis present

## 2013-12-10 DIAGNOSIS — Z8541 Personal history of malignant neoplasm of cervix uteri: Secondary | ICD-10-CM | POA: Diagnosis not present

## 2013-12-10 DIAGNOSIS — Z8739 Personal history of other diseases of the musculoskeletal system and connective tissue: Secondary | ICD-10-CM | POA: Diagnosis not present

## 2013-12-10 DIAGNOSIS — F341 Dysthymic disorder: Secondary | ICD-10-CM | POA: Diagnosis not present

## 2013-12-10 DIAGNOSIS — Z872 Personal history of diseases of the skin and subcutaneous tissue: Secondary | ICD-10-CM | POA: Diagnosis not present

## 2013-12-10 DIAGNOSIS — J209 Acute bronchitis, unspecified: Secondary | ICD-10-CM | POA: Insufficient documentation

## 2013-12-10 DIAGNOSIS — I10 Essential (primary) hypertension: Secondary | ICD-10-CM | POA: Insufficient documentation

## 2013-12-10 DIAGNOSIS — K219 Gastro-esophageal reflux disease without esophagitis: Secondary | ICD-10-CM | POA: Insufficient documentation

## 2013-12-10 DIAGNOSIS — R51 Headache: Secondary | ICD-10-CM | POA: Insufficient documentation

## 2013-12-10 DIAGNOSIS — IMO0001 Reserved for inherently not codable concepts without codable children: Secondary | ICD-10-CM | POA: Diagnosis not present

## 2013-12-10 DIAGNOSIS — E039 Hypothyroidism, unspecified: Secondary | ICD-10-CM | POA: Diagnosis not present

## 2013-12-10 DIAGNOSIS — Z860100 Personal history of colon polyps, unspecified: Secondary | ICD-10-CM | POA: Insufficient documentation

## 2013-12-10 DIAGNOSIS — Z79899 Other long term (current) drug therapy: Secondary | ICD-10-CM | POA: Insufficient documentation

## 2013-12-10 DIAGNOSIS — F17200 Nicotine dependence, unspecified, uncomplicated: Secondary | ICD-10-CM | POA: Diagnosis not present

## 2013-12-10 DIAGNOSIS — Z8701 Personal history of pneumonia (recurrent): Secondary | ICD-10-CM | POA: Insufficient documentation

## 2013-12-10 DIAGNOSIS — Z8742 Personal history of other diseases of the female genital tract: Secondary | ICD-10-CM | POA: Diagnosis not present

## 2013-12-10 DIAGNOSIS — R05 Cough: Secondary | ICD-10-CM | POA: Diagnosis present

## 2013-12-10 DIAGNOSIS — Z8543 Personal history of malignant neoplasm of ovary: Secondary | ICD-10-CM | POA: Insufficient documentation

## 2013-12-10 DIAGNOSIS — J40 Bronchitis, not specified as acute or chronic: Secondary | ICD-10-CM

## 2013-12-10 MED ORDER — LEVOFLOXACIN 500 MG PO TABS: 500.0000 mg | ORAL_TABLET | Freq: Every day | ORAL | Status: DC

## 2013-12-10 MED ORDER — ALBUTEROL SULFATE HFA 108 (90 BASE) MCG/ACT IN AERS: 1.0000 | INHALATION_SPRAY | Freq: Four times a day (QID) | RESPIRATORY_TRACT | Status: DC | PRN

## 2013-12-10 NOTE — ED Provider Notes (Signed)
CSN: [id number]     Arrival date & time 12/10/13  1032 History  This chart was scribed for Dorothy Diego, MD by Ludger Nutting, ED Scribe. This patient was seen in room APA03/APA03 and the patient's care was started 11:13 AM.    Chief Complaint  Patient presents with  . Cough  . Generalized Body Aches      Patient is a 58 y.o. female presenting with cough. The history is provided by the patient. No language interpreter was used.  Cough Cough characteristics:  Productive Sputum characteristics:  Yellow Severity:  Moderate Onset quality:  Gradual Duration:  3 days Timing:  Constant Progression:  Worsening Chronicity:  New Smoker: yes   Relieved by:  None tried Associated symptoms: headaches, myalgias and sore throat   Associated symptoms: no chest pain, no eye discharge, no fever and no rash     HPI Comments: Dorothy [name] is a 58 y.o. female who presents to the Emergency Department complaining of 3 days of gradual onset, gradually worsening, constant cough productive of yellow sputum. She also has associated HA's, sore throat, and generalized myalgias. She has not tried any OTC medications for her symptoms. She denies fever. She is a 1 pack per day smoker. She has a history of pneumonia.   Past Medical History  Diagnosis Date  . Constipation   . Hypertension     takes meds  . Hypothyroidism     on Levonthyroxine  . Pneumonia     x 2- last time > 5 years ago  . Depression     Takes Klonopin  . Arthritis     hands and Knees  . Avascular necrosis of bones of both hips 01/17/2012  . Hypothyroidism (acquired) 01/17/2012    Since age 89  . Benign essential HTN 01/17/2012  . Depression with anxiety 01/17/2012  . Ovarian cyst 01/17/2012    1995  BSO  . Pneumonia 01/17/2012    10 yrs ago and 5 years ago   . Rash and other nonspecific skin eruption 01/17/2012    Dorsum hands & extensor surfaces arms; intermittent  . Cancer     ovarian  . Cancer of cervix 01/17/2012  . Anxiety    . GERD (gastroesophageal reflux disease)   . Personal history of colonic polyps 11/07/2007   Past Surgical History  Procedure Laterality Date  . Total hip arthroplasty  09/06/2011    Procedure: TOTAL HIP ARTHROPLASTY;  Surgeon: Ninetta Lights, MD;  Location: [place];  Service: Orthopedics;  Laterality: Left;  TOTAL HIP ARTHROPLASTY LEFT SIDE  . Ovarian cyst removal  1997  . Total hip arthroplasty  10/11/2011    Procedure: TOTAL HIP ARTHROPLASTY;  Surgeon: Ninetta Lights, MD;  Location: [place];  Service: Orthopedics;  Laterality: Right;  120 MINUTES FOR THIS SURGERY  . Colonoscopy  2009    [place]-polyps, 5 yr FU  . Mesenteric artery bypass  09/25/2012    Procedure: MESENTERIC ARTERY BYPASS;  Surgeon: Elam Dutch, MD;  Location: [facility] OR;  Service: Vascular;  Laterality: N/A;  Bypass using Hemashield Gold Vascular Graft 13mm x 4mm x 40cm  . Celiac artery bypass  09/25/2012    Procedure: CELIAC ARTERY BYPASS;  Surgeon: Elam Dutch, MD;  Location: [facility] OR;  Service: Vascular;  Laterality: N/A;  Bypass using Hemashield Gold Vascular Graft 1mm x 28mm x 40cm  . Wound exploration  10/02/2012    Procedure: WOUND EXPLORATION;  Surgeon: Elam Dutch, MD;  Location:  [facility] OR;  Service: Vascular;  Laterality: N/A;  abdominal wound exploration  . Abdominal hysterectomy  1982    ovaries retained--for CIS   Family History  Problem Relation Age of Onset  . Anesthesia problems Neg Hx   . Hypotension Neg Hx   . Malignant hyperthermia Neg Hx   . Pseudochol deficiency Neg Hx   . Colon cancer Neg Hx   . Esophageal cancer Neg Hx   . Rectal cancer Neg Hx   . Stomach cancer Neg Hx   . Breast cancer Sister 40    dec age 23  . Cancer Mother     lung  . Hypertension Son   . Heart failure Father    History  Substance Use Topics  . Smoking status: Current Every Day Smoker -- 0.50 packs/day for 40 years    Types: Cigarettes    Last Attempt to Quit: 07/26/2012  . Smokeless tobacco: Never Used      Comment: pt states that she wants to quit but needs help was advised to call 1-800-QUITNOW and to speak with PCP about avaliable aids  . Alcohol Use: No   OB History   Grav Para Term Preterm Abortions TAB SAB Ect Mult Living   2 1 1  1  1   1      Review of Systems  Constitutional: Negative for fever, appetite change and fatigue.  HENT: Positive for sore throat. Negative for congestion, ear discharge and sinus pressure.   Eyes: Negative for discharge.  Respiratory: Positive for cough.   Cardiovascular: Negative for chest pain.  Gastrointestinal: Negative for abdominal pain and diarrhea.  Genitourinary: Negative for frequency and hematuria.  Musculoskeletal: Positive for myalgias. Negative for back pain.  Skin: Negative for rash.  Neurological: Positive for headaches. Negative for seizures.  Psychiatric/Behavioral: Negative for hallucinations.      Allergies  Sulfa antibiotics  Home Medications   Current Outpatient Rx  Name  Route  Sig  Dispense  Refill  . Chlorphen-Phenyleph-ASA (ALKA-SELTZER PLUS COLD PO)   Oral   Take 2 tablets by mouth 2 (two) times daily as needed (congestion/cold).         . clonazePAM (KLONOPIN) 0.5 MG tablet   Oral   Take 0.75 mg by mouth 2 (two) times daily as needed for anxiety.         Marland Kitchen estradiol (ESTRACE) 1 MG tablet   Oral   Take 1 mg by mouth daily.          Marland Kitchen guaiFENesin (MUCINEX) 600 MG 12 hr tablet   Oral   Take 600 mg by mouth 2 (two) times daily as needed for cough.         . hydrochlorothiazide (HYDRODIURIL) 25 MG tablet   Oral   Take 12.5 mg by mouth daily.         Marland Kitchen levothyroxine (SYNTHROID, LEVOTHROID) 112 MCG tablet   Oral   Take 112 mcg by mouth daily.         Marland Kitchen omeprazole (PRILOSEC) 40 MG capsule   Oral   Take 40 mg by mouth as needed (heartburn).          . venlafaxine (EFFEXOR-XR) 150 MG 24 hr capsule   Oral   Take 300 mg by mouth daily with breakfast.           BP 131/84  Pulse 74  Temp(Src)  97.9 F (36.6 C) (Oral)  Resp 20  Ht 5\' 8"  (1.727 m)  Wt 150 lb (  68.04 kg)  BMI 22.81 kg/m2  SpO2 97% Physical Exam  Nursing note and vitals reviewed. Constitutional: She is oriented to person, place, and time. She appears well-developed.  HENT:  Head: Normocephalic.  Eyes: Conjunctivae and EOM are normal. No scleral icterus.  Neck: Neck supple. No thyromegaly present.  Cardiovascular: Normal rate and regular rhythm.  Exam reveals no gallop and no friction rub.   No murmur heard. Pulmonary/Chest: No stridor. She has wheezes (minimal ). She has no rales. She exhibits no tenderness.  Abdominal: She exhibits no distension. There is no tenderness. There is no rebound.  Musculoskeletal: Normal range of motion. She exhibits no edema.  Lymphadenopathy:    She has no cervical adenopathy.  Neurological: She is oriented to person, place, and time. She exhibits normal muscle tone. Coordination normal.  Skin: No rash noted. No erythema.  Psychiatric: She has a normal mood and affect. Her behavior is normal.    ED Course  Procedures (including critical care time)  DIAGNOSTIC STUDIES: Oxygen Saturation is 97% on RA, adequate by my interpretation.    COORDINATION OF CARE: 11:35 AM Discussed treatment plan with pt at bedside and pt agreed to plan.   Labs Review Labs Reviewed - No data to display Imaging Review Dg Chest 2 View  12/10/2013   CLINICAL DATA:  Cough and congestion.  EXAM: CHEST  2 VIEW  COMPARISON:  DG CHEST 1V PORT dated 09/25/2012  FINDINGS: Mediastinum and hilar structures are normal. The lungs are clear. No evidence of interstitial pulmonary edema noted on today's exam. Cardiac structures are unremarkable. No pleural effusion for pneumothorax. No acute osseous abnormality. Degenerative changes thoracic spine.  IMPRESSION: No active cardiopulmonary disease.   Electronically Signed   By: Marcello Moores  Register   On: 12/10/2013 11:21    EKG Interpretation   None       MDM    Final diagnoses:  None   The chart was scribed for me under my direct supervision.  I personally performed the history, physical, and medical decision making and all procedures in the evaluation of this patient.Dorothy Diego, MD 12/10/13 [phone]

## 2013-12-10 NOTE — ED Notes (Signed)
Pt c/o productive cough, headache, and bodyaches x 4 days.  Denies fever.

## 2013-12-10 NOTE — ED Notes (Signed)
Pt states cough, sore throat, headache. NAD.

## 2013-12-10 NOTE — Discharge Instructions (Signed)
Follow up with Dr. Delphina Cahill or Dr. Cindie Laroche in 1-2 weeks

## 2013-12-12 ENCOUNTER — Ambulatory Visit: Payer: Self-pay | Admitting: Obstetrics and Gynecology

## 2013-12-25 ENCOUNTER — Ambulatory Visit: Payer: BC Managed Care – PPO

## 2014-01-06 ENCOUNTER — Ambulatory Visit: Payer: BC Managed Care – PPO

## 2014-01-09 ENCOUNTER — Ambulatory Visit: Payer: Self-pay | Admitting: Obstetrics and Gynecology

## 2014-01-29 ENCOUNTER — Ambulatory Visit
Admission: RE | Admit: 2014-01-29 | Discharge: 2014-01-29 | Disposition: A | Discharge status: Home / Self Care | Payer: Commercial Managed Care - HMO | Source: Ambulatory Visit

## 2014-01-29 DIAGNOSIS — Z1231 Encounter for screening mammogram for malignant neoplasm of breast: Secondary | ICD-10-CM

## 2014-02-05 ENCOUNTER — Encounter: Payer: Self-pay | Admitting: Obstetrics and Gynecology

## 2014-02-05 ENCOUNTER — Ambulatory Visit (INDEPENDENT_AMBULATORY_CARE_PROVIDER_SITE_OTHER): Payer: Commercial Managed Care - HMO | Admitting: Obstetrics and Gynecology

## 2014-02-05 VITALS — BP 128/80 | HR 70 | Ht 66.0 in | Wt 163.8 lb

## 2014-02-05 DIAGNOSIS — Z01419 Encounter for gynecological examination (general) (routine) without abnormal findings: Secondary | ICD-10-CM

## 2014-02-05 DIAGNOSIS — Z8542 Personal history of malignant neoplasm of other parts of uterus: Secondary | ICD-10-CM

## 2014-02-05 MED ORDER — ESTRADIOL 0.5 MG PO TABS: 1.0000 mg | ORAL_TABLET | Freq: Every day | ORAL | Status: DC

## 2014-02-05 NOTE — Progress Notes (Signed)
Patient ID: Dorothy [name], female   DOB: May 05, 1956, 58 y.o.   MRN: [id number] GYNECOLOGY VISIT  PCP   Angelina Ok, FNP [facility]ier, [place]):   Referring provider:   HPI: 58 y.o.   Widowed  Caucasian  female   [id number] with Patient's last menstrual period was 10/23/1980.   here for  AEX.  On Estrace 1 mg since 1998.  Asking about testosterone treatment. Patient's sister with breast cancer.   Hgb:    PCP Urine:    GYNECOLOGIC HISTORY: Patient's last menstrual period was 10/23/1980. Sexually active:  yes Partner preference: female Contraception:  Hysterectomy for CIS age 13, ovaries retained Menopausal hormone therapy:  DES exposure:    Blood transfusions:    Sexually transmitted diseases:    GYN procedures and prior surgeries:  Total vaginal hysterectomy 1982 for cervical cancer:ovaries retained at that time.  Ovarian cyst removal/BSO for cyst adenoma in 1998.   Last mammogram:  01-29-14 wnl:[facility]               Last pap and high risk HPV testing:   10-26-09 wnl History of abnormal pap smear: no    OB History   Grav Para Term Preterm Abortions TAB SAB Ect Mult Living   2 1 1  1  1   1        LIFESTYLE: Exercise:   No, does her own gardening.        Tobacco:   Smokes 1ppd Alcohol:      no Drug use:   no  OTHER HEALTH MAINTENANCE: Tetanus/TDap:  unsure Gardisil:               n/a Influenza:             07/2013 Zostavax               n/a  Bone density:       2005 wnl Colonoscopy:        11-07-07 revealed adenomatous polyps with Dr. Carlean Purl.  Per patient she doesn't need another colonoscopy for 10 years. Cholesterol check: wnl with PCP  Family History  Problem Relation Age of Onset  . Anesthesia problems Neg Hx   . Hypotension Neg Hx   . Malignant hyperthermia Neg Hx   . Pseudochol deficiency Neg Hx   . Colon cancer Neg Hx   . Esophageal cancer Neg Hx   . Rectal cancer Neg Hx   . Stomach cancer Neg Hx   . Breast cancer Sister 34    dec age 64  . Cancer  Mother     lung  . Hypertension Son   . Heart failure Father     Patient Active Problem List   Diagnosis Date Noted  . 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 Chronic vascular insufficiency of intestine 10/18/2012  . Acute respiratory failure with hypoxia 09/25/2012  . Unspecified vascular insufficiency of intestine 09/05/2012  . Mesenteric ischemia 08/01/2012  . Hyponatremia 07/10/2012  . Abdominal pain 07/10/2012  . 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 Hematochezia 07/10/2012  . Avascular necrosis of bones of both hips 01/17/2012  . Hypothyroidism (acquired) 01/17/2012  . Benign essential HTN 01/17/2012  . Depression with anxiety 01/17/2012  . Cancer of cervix 01/17/2012  . Ovarian cyst 01/17/2012  . Pneumonia 01/17/2012  . 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 Rash and other nonspecific skin eruption 01/17/2012  . Personal history of colonic polyps 11/07/2007   Past Medical History  Diagnosis  Date  . Constipation   . Hypertension     takes meds  . Hypothyroidism     on Levonthyroxine  . Pneumonia     x 2- last time > 5 years ago  . Depression     Takes Klonopin  . Arthritis     hands and Knees  . Avascular necrosis of bones of both hips 01/17/2012  . Hypothyroidism (acquired) 01/17/2012    Since age 22  . Benign essential HTN 01/17/2012  . Depression with anxiety 01/17/2012  . Ovarian cyst 01/17/2012    1995  BSO  . Pneumonia 01/17/2012    10 yrs ago and 5 years ago   . Rash and other nonspecific skin eruption 01/17/2012    Dorsum hands & extensor surfaces arms; intermittent  . Cancer     ovarian  . Cancer of cervix 01/17/2012  . Anxiety   . GERD (gastroesophageal reflux disease)   . Personal history of colonic polyps 11/07/2007    Past Surgical History  Procedure Laterality Date  . Total hip arthroplasty  09/06/2011    Procedure: TOTAL HIP ARTHROPLASTY;  Surgeon: Ninetta Lights, MD;  Location: [place];  Service: Orthopedics;   Laterality: Left;  TOTAL HIP ARTHROPLASTY LEFT SIDE  . Ovarian cyst removal  1997  . Total hip arthroplasty  10/11/2011    Procedure: TOTAL HIP ARTHROPLASTY;  Surgeon: Ninetta Lights, MD;  Location: [place];  Service: Orthopedics;  Laterality: Right;  120 MINUTES FOR THIS SURGERY  . Colonoscopy  2009    [place]-polyps, 5 yr FU  . Mesenteric artery bypass  09/25/2012    Procedure: MESENTERIC ARTERY BYPASS;  Surgeon: Elam Dutch, MD;  Location: [facility] OR;  Service: Vascular;  Laterality: N/A;  Bypass using Hemashield Gold Vascular Graft 65mm x 61mm x 40cm  . Celiac artery bypass  09/25/2012    Procedure: CELIAC ARTERY BYPASS;  Surgeon: Elam Dutch, MD;  Location: [facility] OR;  Service: Vascular;  Laterality: N/A;  Bypass using Hemashield Gold Vascular Graft 72mm x 38mm x 40cm  . Wound exploration  10/02/2012    Procedure: WOUND EXPLORATION;  Surgeon: Elam Dutch, MD;  Location: [facility] OR;  Service: Vascular;  Laterality: N/A;  abdominal wound exploration  . Abdominal hysterectomy  1982    ovaries retained--for CIS    ALLERGIES: Sulfa antibiotics  Current Outpatient Prescriptions  Medication Sig Dispense Refill  . albuterol (PROVENTIL HFA;VENTOLIN HFA) 108 (90 BASE) MCG/ACT inhaler Inhale 1-2 puffs into the lungs every 6 (six) hours as needed for wheezing or shortness of breath.  1 Inhaler  0  . Chlorphen-Phenyleph-ASA (ALKA-SELTZER PLUS COLD PO) Take 2 tablets by mouth 2 (two) times daily as needed (congestion/cold).      . clonazePAM (KLONOPIN) 0.5 MG tablet Take 0.75 mg by mouth 2 (two) times daily as needed for anxiety.      Marland Kitchen estradiol (ESTRACE) 1 MG tablet Take 1 mg by mouth daily.       Marland Kitchen guaiFENesin (MUCINEX) 600 MG 12 hr tablet Take 600 mg by mouth 2 (two) times daily as needed for cough.      . hydrochlorothiazide (HYDRODIURIL) 25 MG tablet Take 12.5 mg by mouth daily.      Marland Kitchen levofloxacin (LEVAQUIN) 500 MG tablet Take 1 tablet (500 mg total) by mouth daily.  7 tablet  0  .  levothyroxine (SYNTHROID, LEVOTHROID) 112 MCG tablet Take 112 mcg by mouth daily.      Marland Kitchen omeprazole (PRILOSEC) 40  MG capsule Take 40 mg by mouth as needed (heartburn).       . venlafaxine (EFFEXOR-XR) 150 MG 24 hr capsule Take 300 mg by mouth daily with breakfast.        No current facility-administered medications for this visit.     ROS:  Pertinent items are noted in HPI.  SOCIAL HISTORY:  Widow.   PHYSICAL EXAMINATION:    BP 128/80  Pulse 70  Ht 5\' 6"  (1.676 m)  Wt 163 lb 12.8 oz (74.299 kg)  BMI 26.45 kg/m2  LMP 10/23/1980   Wt Readings from Last 3 Encounters:  02/05/14 163 lb 12.8 oz (74.299 kg)  12/10/13 150 lb (68.04 kg)  03/07/13 152 lb (68.947 kg)     Ht Readings from Last 3 Encounters:  02/05/14 5\' 6"  (1.676 m)  12/10/13 5\' 8"  (1.727 m)  03/07/13 5' 6.5" (1.689 m)    General appearance: alert, cooperative and appears stated age Head: Normocephalic, without obvious abnormality, atraumatic Neck: no adenopathy, supple, symmetrical, trachea midline and thyroid not enlarged, symmetric, no tenderness/mass/nodules Lungs: clear to auscultation bilaterally Breasts: Inspection negative, No nipple retraction or dimpling, No nipple discharge or bleeding, No axillary or supraclavicular adenopathy, Normal to palpation without dominant masses Heart: regular rate and rhythm Abdomen: vertical midline incision, soft, non-tender; no masses,  no organomegaly Extremities: extremities normal, atraumatic, no cyanosis or edema Skin: Skin color, texture, turgor normal. No rashes or lesions Lymph nodes: Cervical, supraclavicular, and axillary nodes normal. No abnormal inguinal nodes palpated Neurologic: Grossly normal  Pelvic: External genitalia:  no lesions              Urethra:  normal appearing urethra with no masses, tenderness or lesions              Bartholins and Skenes: normal                 Vagina: normal appearing vagina with normal color and discharge, no lesions               Cervix:  absent              Pap done: yes.            Bimanual Exam:  Uterus:  absent                                      Adnexa no masses                                      Rectovaginal: Confirms                                      Anus:  normal sphincter tone, no lesions  ASSESSMENT  Normal gynecologic exam. Status post TVH. Status post BSO. ERT patient. Family history of breast cancer.  Personal history of mesenteric thrombosis.    PLAN  Mammogram recommended yearly.  Pap smear done.  Goal will be to discontinue ERT.  Patient will reduce to Estrace 0.5 mg for one month.  She will then reduce for 0.25 md (splitting 0.5 mg tablet in half) for 3 -4 months and then stopping.  I discussed risks of breast cancer, DVT, PE, MI, stroke.  No testosterone  at this time.  Handout on menopause to patient.  Bone density next year.  Counseled on self breast exam, Calcium and vitamin D intake, exercise. Return annually or prn   An After Visit Summary was printed and given to the patient.

## 2014-02-05 NOTE — Patient Instructions (Signed)

## 2014-02-06 LAB — IPS PAP SMEAR ONLY

## 2014-02-11 ENCOUNTER — Telehealth: Payer: Self-pay | Admitting: *Deleted

## 2014-02-11 DIAGNOSIS — D099 Carcinoma in situ, unspecified: Secondary | ICD-10-CM

## 2014-02-11 NOTE — Telephone Encounter (Signed)
Patient notified of abnormal pap smear and recommendation for colposcopy. Briefly explained procedure and scheduled for 03-05-14.Aware Dorothy [name] will contact her next week to assist with insurance.  Routing to provider for final review. Patient agreeable to disposition. Will close encounter

## 2014-02-11 NOTE — Telephone Encounter (Signed)
Message copied by Jaymes Graff on Wed Feb 11, 2014  8:02 PM ------      Message from: Chapman      Created: Tue Feb 10, 2014  2:42 PM       Please inform patient of abnormal pap showing potential CIS.      She had a hysterectomy years ago for CIS.        I will need to do a colposcopy.             Cc - Marisa Sprinkles ------

## 2014-02-16 ENCOUNTER — Telehealth: Payer: Self-pay | Admitting: Obstetrics and Gynecology

## 2014-02-16 NOTE — Telephone Encounter (Signed)
Mailed the In-Office procedure form that includes appointment date and time, patient copay, and cancellation policy.

## 2014-02-26 ENCOUNTER — Ambulatory Visit: Payer: BC Managed Care – PPO | Admitting: Vascular Surgery

## 2014-02-26 ENCOUNTER — Other Ambulatory Visit (HOSPITAL_COMMUNITY): Payer: BC Managed Care – PPO

## 2014-03-05 ENCOUNTER — Encounter: Payer: Self-pay | Admitting: Obstetrics and Gynecology

## 2014-03-05 ENCOUNTER — Ambulatory Visit (INDEPENDENT_AMBULATORY_CARE_PROVIDER_SITE_OTHER): Payer: Commercial Managed Care - HMO | Admitting: Obstetrics and Gynecology

## 2014-03-05 VITALS — BP 130/70 | HR 76 | Ht 66.0 in | Wt 165.2 lb

## 2014-03-05 DIAGNOSIS — D099 Carcinoma in situ, unspecified: Secondary | ICD-10-CM

## 2014-03-05 NOTE — Progress Notes (Signed)
Subjective:     Patient ID: Dorothy [name], female   DOB: 03/02/1956, 58 y.o.   MRN: [id number]  HPI  Patient is here for colposcopy of the vagina for pap showing HGSIL. History of hysterectomy for CIS of cervix on chart review. Patient states she was told she had cervical cancer.   No formal documentation of a pathology report is in the paper chart.  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   Review of Systems     Objective:   Physical Exam  Genitourinary:        Consent for colposcopy. Speculum placed in vagina along with acetic acid. Colposcopy performed. Biopsies sent separately of 6, 2, and 12 o'clock where small areas of acetowhite thickening was seen.  Monsel's placed. Minimal EBL. No complications.    Assessment:     History of CIS of the cervix, question if it were really cancer. 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     Plan:     Patient to return in 8 days for a talking visit to discuss treatment if biopsies confirm HGSIL of vagina. Will request records from hysterectomy procedure and specimen.  Instructions and precautions given.  After visit summary to patient.

## 2014-03-06 ENCOUNTER — Encounter: Payer: Self-pay | Admitting: Obstetrics and Gynecology

## 2014-03-10 LAB — IPS OTHER TISSUE BIOPSY

## 2014-03-11 ENCOUNTER — Encounter: Payer: Self-pay | Admitting: Vascular Surgery

## 2014-03-12 ENCOUNTER — Ambulatory Visit (HOSPITAL_COMMUNITY)
Admission: RE | Admit: 2014-03-12 | Discharge: 2014-03-12 | Disposition: A | Discharge status: Home / Self Care | Payer: Medicare HMO | Source: Ambulatory Visit | Attending: Vascular Surgery | Admitting: Vascular Surgery

## 2014-03-12 ENCOUNTER — Encounter: Payer: Self-pay | Admitting: Vascular Surgery

## 2014-03-12 ENCOUNTER — Ambulatory Visit (INDEPENDENT_AMBULATORY_CARE_PROVIDER_SITE_OTHER): Payer: Commercial Managed Care - HMO | Admitting: Vascular Surgery

## 2014-03-12 VITALS — BP 120/75 | HR 67 | Ht 66.0 in | Wt 160.0 lb

## 2014-03-12 DIAGNOSIS — K551 Chronic vascular disorders of intestine: Secondary | ICD-10-CM | POA: Insufficient documentation

## 2014-03-12 DIAGNOSIS — Z48812 Encounter for surgical aftercare following surgery on the circulatory system: Secondary | ICD-10-CM | POA: Insufficient documentation

## 2014-03-12 NOTE — Progress Notes (Signed)
Patient is a 58 year old female who presents for followup today. 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 She states she has regained some weight. She has no food fear. She has no postprandial abdominal pain. She has some occasional pain in her left leg with ambulation but does have a history of chronic back issues as well. Unfortunately she continues to smoke. Greater than 3 minutes they were spent regarding smoking cessation counseling.  Review of systems: She denies chest pain. She denies shortness of breath.  Physical exam:  Filed Vitals:   03/12/14 0913  Height: 5\' 6"  (1.676 m)  Weight: 160 lb (72.576 kg)   Abdomen: Soft nontender nondistended well-healed midline laparotomy incision without hernia 2+ femoral pulse left 1+ femoral pulse right, no abdominal bruit Neck: No carotid bruits Chest: Clear to auscultation bilaterally Cardiac: Regular rate and rhythm without murmur  Data: Patient had a mesenteric duplex exam today which showed her bypass graft is patent to the celiac and superior mesenteric artery slight increased velocity of SMA graft 285 cm/s no narrowing on gray scale. I reviewed and interpreted this study.  Assessment: Patent mesenteric bypass. Plan: Continue to try to quit smoking. Follow up one year with mesenteric duplex  Ruta Hinds, MD [facility] Office: [phone] Pager: [phone]

## 2014-03-13 ENCOUNTER — Ambulatory Visit (INDEPENDENT_AMBULATORY_CARE_PROVIDER_SITE_OTHER): Payer: Medicare HMO | Admitting: Obstetrics and Gynecology

## 2014-03-13 ENCOUNTER — Encounter: Payer: Self-pay | Admitting: Obstetrics and Gynecology

## 2014-03-13 VITALS — BP 150/86 | HR 80 | Temp 98.3°F | Ht 66.5 in | Wt 162.0 lb

## 2014-03-13 DIAGNOSIS — N891 Moderate vaginal dysplasia: Secondary | ICD-10-CM

## 2014-03-13 DIAGNOSIS — N893 Dysplasia of vagina, unspecified: Secondary | ICD-10-CM

## 2014-03-13 NOTE — Progress Notes (Signed)
Patient ID: Dorothy [name], female   DOB: 01-Aug-1956, 58 y.o.   MRN: [id number] GYNECOLOGY  VISIT   HPI: 58 y.o.   Widowed  Caucasian  female   [id number] with Patient's last menstrual period was 10/23/1980.   here for  Consultation regarding biopsy results.  Patient's sister in law is present today.   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  Colposcopy of the vagina 03/05/14 showed multifocal areas of the cuff with acetowhite change. 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  No pathology report available for hysterectomy.   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  GYNECOLOGIC HISTORY: Patient's last menstrual period was 10/23/1980. Contraception:   Hysterectomy Menopausal hormone therapy:Estradiol 0.5mg         OB History   Grav Para Term Preterm Abortions TAB SAB Ect Mult Living   2 1 1  1  1   1          Patient Active Problem List   Diagnosis Date Noted  . Acute respiratory failure with hypoxia 09/25/2012  . Hyponatremia 07/10/2012  . Abdominal pain 07/10/2012  . Hematochezia 07/10/2012  . Avascular necrosis of bones of both hips 01/17/2012  . Hypothyroidism (acquired) 01/17/2012  . Benign essential HTN 01/17/2012  . Depression with anxiety 01/17/2012  . Cancer of cervix 01/17/2012  . Ovarian cyst 01/17/2012  . Pneumonia 01/17/2012  . 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 Rash and other nonspecific skin eruption 01/17/2012  . Personal history of colonic polyps 11/07/2007    Past Medical History  Diagnosis Date  . Constipation   . Hypertension     takes meds  . Hypothyroidism     on Levonthyroxine  . Pneumonia     x 2- last time > 5 years ago  . Depression     Takes Klonopin  . Arthritis     hands and Knees  .  Avascular necrosis of bones of both hips 01/17/2012  . Hypothyroidism (acquired) 01/17/2012    Since age 37  . Benign essential HTN 01/17/2012  . Depression with anxiety 01/17/2012  . Ovarian cyst 01/17/2012    1995  BSO  . Pneumonia 01/17/2012    10 yrs ago and 5 years ago   . Rash and other nonspecific skin eruption 01/17/2012    Dorsum hands & extensor surfaces arms; intermittent  . Anxiety   . GERD (gastroesophageal reflux disease)   . Personal history of colonic polyps 11/07/2007  . Cancer 1982    ovarian  . Cancer of cervix     Past Surgical History  Procedure Laterality Date  . Total hip arthroplasty  09/06/2011    Procedure: TOTAL HIP ARTHROPLASTY;  Surgeon: Ninetta Lights, MD;  Location: [place];  Service: Orthopedics;  Laterality: Left;  TOTAL HIP ARTHROPLASTY LEFT SIDE  . Ovarian cyst removal  1997    -BSO  . Total hip arthroplasty  10/11/2011    Procedure: TOTAL HIP ARTHROPLASTY;  Surgeon: Ninetta Lights, MD;  Location: [place];  Service: Orthopedics;  Laterality: Right;  120 MINUTES FOR THIS SURGERY  . Colonoscopy  2009    [place]-polyps, 5 yr FU  . 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 Celiac artery bypass  09/25/2012    Procedure: CELIAC ARTERY BYPASS;  Surgeon: Elam Dutch, MD;  Location: [facility] OR;  Service: Vascular;  Laterality: N/A;  Bypass using Hemashield Gold Vascular Graft 49mm x 99mm x 40cm  . Wound exploration  10/02/2012    Procedure: WOUND EXPLORATION;  Surgeon: Elam Dutch, MD;  Location: [facility] OR;  Service: Vascular;  Laterality: N/A;  abdominal wound exploration  . Abdominal hysterectomy  1982    ovaries retained--for CIS  . Joint replacement      Current Outpatient Prescriptions  Medication Sig Dispense Refill  . albuterol (PROVENTIL HFA;VENTOLIN HFA) 108 (90  BASE) MCG/ACT inhaler Inhale 1-2 puffs into the lungs every 6 (six) hours as needed for wheezing or shortness of breath.  1 Inhaler  0  . CALCIUM PO Take 1 tablet by mouth 2 (two) times daily.      . clonazePAM (KLONOPIN) 0.5 MG tablet Take 0.75 mg by mouth 2 (two) times daily as needed for anxiety.      Marland Kitchen estradiol (ESTRACE) 0.5 MG tablet Take 2 tablets (1 mg total) by mouth daily.  90 tablet  1  . Fish Oil-Cholecalciferol (FISH OIL + D3 PO) Take 1 tablet by mouth at bedtime.      . hydrochlorothiazide (HYDRODIURIL) 25 MG tablet Take 12.5 mg by mouth daily.      Marland Kitchen levothyroxine (SYNTHROID, LEVOTHROID) 112 MCG tablet Take 112 mcg by mouth daily.      . Multiple Vitamin (MULTIVITAMIN) capsule Take 1 capsule by mouth daily.      Marland Kitchen omeprazole (PRILOSEC) 40 MG capsule Take 40 mg by mouth as needed (heartburn).       . venlafaxine (EFFEXOR-XR) 150 MG 24 hr capsule Take 300 mg by mouth daily with breakfast.        No current facility-administered medications for this visit.     ALLERGIES: Sulfa antibiotics  Family History  Problem Relation Age of Onset  . Anesthesia problems Neg Hx   . Hypotension Neg Hx   . Malignant hyperthermia Neg Hx   . Pseudochol deficiency Neg Hx   . Colon cancer Neg Hx   . Esophageal cancer Neg Hx   . Rectal cancer Neg Hx   . Stomach cancer Neg Hx   . Breast cancer Sister 21    dec age 63  . Cancer Sister   . Cancer Mother     lung  . Hypertension Son   . Heart failure Father   . Ovarian cancer Paternal Grandmother     dec age 89    History   Social History  . Marital Status: Widowed    Spouse Name: N/A    Number of Children: 1  . Years of Education: N/A   Occupational History  . disabled    Social History Main Topics  . Smoking status: Current Every Day Smoker -- 1.00 packs/day for 40 years    Types: Cigarettes    Last Attempt to Quit: 07/26/2012  . Smokeless tobacco: Never Used     Comment: pt states that she wants to quit but needs help was  advised to call 1-800-QUITNOW and to speak with PCP about avaliable aids  . Alcohol Use: No  . Drug Use: No  . Sexual Activity: Yes    Partners: Male    Birth Control/ Protection: Surgical  Comment: TVH/ovaries retained   Other Topics Concern  . Not on file   Social History Narrative  . No narrative on file    ROS:  Pertinent items are noted in HPI.  PHYSICAL EXAMINATION:    BP 150/86  Pulse 80  Temp(Src) 98.3 F (36.8 C) (Oral)  Ht 5' 6.5" (1.689 m)  Wt 162 lb (73.483 kg)  BMI 25.76 kg/m2  LMP 10/23/1980     General appearance: alert, cooperative and appears stated age    ASSESSMENT  Status post TVH for CIS.  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 Paternal grandmother with ovarian cancer.  Sister with breast cancer age 100, deceased age 32.  Current VAIN 1 and 2.  Multifocal dysplasia of vaginal cuff.   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 I discussed vaginal dysplasia and options for care including surgical excision, ablation with CO2 laser, imiquimod, and 5-FU. Information printed for patient on vaginal dysplasia from Up To Date. I will refer the patient to Dr. Alycia Rossetti for consultation and treatment.    25 minutes face to face time of which over 50% was spent in counseling.   After visit summary to patient.

## 2014-03-17 NOTE — Addendum Note (Signed)
Addended by: Michele Mcalpine on: 03/17/2014 09:11 AM   Modules accepted: Orders

## 2014-03-18 ENCOUNTER — Telehealth: Payer: Self-pay | Admitting: Obstetrics and Gynecology

## 2014-03-18 NOTE — Telephone Encounter (Signed)
Tried to contact patient via her mobile #. There was no answer and no voicemail available. Need to inform patient of appointment with Dr Alycia Rossetti at the [facility] on June 05.

## 2014-03-25 ENCOUNTER — Encounter (HOSPITAL_COMMUNITY): Payer: Self-pay | Admitting: Emergency Medicine

## 2014-03-25 ENCOUNTER — Emergency Department (HOSPITAL_COMMUNITY)
Admission: EM | Admit: 2014-03-25 | Discharge: 2014-03-25 | Disposition: A | Discharge status: Home / Self Care | Payer: Medicare HMO | Attending: Emergency Medicine | Admitting: Emergency Medicine

## 2014-03-25 ENCOUNTER — Emergency Department (HOSPITAL_COMMUNITY): Payer: Medicare HMO

## 2014-03-25 DIAGNOSIS — Y9389 Activity, other specified: Secondary | ICD-10-CM | POA: Insufficient documentation

## 2014-03-25 DIAGNOSIS — E039 Hypothyroidism, unspecified: Secondary | ICD-10-CM | POA: Insufficient documentation

## 2014-03-25 DIAGNOSIS — Z8739 Personal history of other diseases of the musculoskeletal system and connective tissue: Secondary | ICD-10-CM | POA: Insufficient documentation

## 2014-03-25 DIAGNOSIS — F17200 Nicotine dependence, unspecified, uncomplicated: Secondary | ICD-10-CM | POA: Insufficient documentation

## 2014-03-25 DIAGNOSIS — Z8543 Personal history of malignant neoplasm of ovary: Secondary | ICD-10-CM | POA: Insufficient documentation

## 2014-03-25 DIAGNOSIS — I10 Essential (primary) hypertension: Secondary | ICD-10-CM | POA: Insufficient documentation

## 2014-03-25 DIAGNOSIS — F341 Dysthymic disorder: Secondary | ICD-10-CM | POA: Insufficient documentation

## 2014-03-25 DIAGNOSIS — Z8701 Personal history of pneumonia (recurrent): Secondary | ICD-10-CM | POA: Insufficient documentation

## 2014-03-25 DIAGNOSIS — Z860100 Personal history of colon polyps, unspecified: Secondary | ICD-10-CM | POA: Insufficient documentation

## 2014-03-25 DIAGNOSIS — Z79899 Other long term (current) drug therapy: Secondary | ICD-10-CM | POA: Insufficient documentation

## 2014-03-25 DIAGNOSIS — W19XXXA Unspecified fall, initial encounter: Secondary | ICD-10-CM

## 2014-03-25 DIAGNOSIS — S20211A Contusion of right front wall of thorax, initial encounter: Secondary | ICD-10-CM

## 2014-03-25 DIAGNOSIS — Z8742 Personal history of other diseases of the female genital tract: Secondary | ICD-10-CM | POA: Insufficient documentation

## 2014-03-25 DIAGNOSIS — S20219A Contusion of unspecified front wall of thorax, initial encounter: Secondary | ICD-10-CM | POA: Insufficient documentation

## 2014-03-25 DIAGNOSIS — Y92009 Unspecified place in unspecified non-institutional (private) residence as the place of occurrence of the external cause: Secondary | ICD-10-CM | POA: Insufficient documentation

## 2014-03-25 DIAGNOSIS — Z9889 Other specified postprocedural states: Secondary | ICD-10-CM | POA: Insufficient documentation

## 2014-03-25 DIAGNOSIS — K219 Gastro-esophageal reflux disease without esophagitis: Secondary | ICD-10-CM | POA: Insufficient documentation

## 2014-03-25 DIAGNOSIS — W108XXA Fall (on) (from) other stairs and steps, initial encounter: Secondary | ICD-10-CM | POA: Insufficient documentation

## 2014-03-25 DIAGNOSIS — Z8541 Personal history of malignant neoplasm of cervix uteri: Secondary | ICD-10-CM | POA: Insufficient documentation

## 2014-03-25 MED ORDER — CYCLOBENZAPRINE HCL 5 MG PO TABS
5.0000 mg | ORAL_TABLET | Freq: Three times a day (TID) | ORAL | Status: DC | PRN
Start: 2014-03-25 — End: 2014-04-27

## 2014-03-25 MED ORDER — IBUPROFEN 600 MG PO TABS: 600.0000 mg | ORAL_TABLET | Freq: Four times a day (QID) | ORAL | Status: DC | PRN

## 2014-03-25 MED ORDER — HYDROCODONE-ACETAMINOPHEN 5-325 MG PO TABS: 1.0000 | ORAL_TABLET | ORAL | Status: DC | PRN

## 2014-03-25 NOTE — ED Notes (Signed)
Pt fell down steps x2 yesterday, fell on rt side, co rt rib pain.

## 2014-03-25 NOTE — Discharge Instructions (Signed)
Your x-ray today shows no rib fracture. Take the medications as directed. Do not take the muscle relaxant or the narcotic if driving. They will make you sleepy. Follow up with your doctor or return here as needed.

## 2014-03-25 NOTE — ED Provider Notes (Signed)
CSN: [id number]     Arrival date & time 03/25/14  1054 History   First MD Initiated Contact with Patient 03/25/14 1120     Chief Complaint  Patient presents with  . Fall     (Consider location/radiation/quality/duration/timing/severity/associated sxs/prior Treatment) Patient is a 58 y.o. female presenting with fall. The history is provided by the patient.  Fall This is a new problem. The current episode started yesterday. The problem occurs constantly. The problem has been unchanged.   Dorothy [name] is a 58 y.o. female who presents to the ED with right rib pain that started yesterday after she slipped on her steps at home and fell onto her right side. She has taken Aleve for pain without results. She used the heating pad and it helped a little. No head injury or LOC. She denies any other injuries.  Past Medical History  Diagnosis Date  . Constipation   . Hypertension     takes meds  . Hypothyroidism     on Levonthyroxine  . Pneumonia     x 2- last time > 5 years ago  . Depression     Takes Klonopin  . Arthritis     hands and Knees  . Avascular necrosis of bones of both hips 01/17/2012  . Hypothyroidism (acquired) 01/17/2012    Since age 82  . Benign essential HTN 01/17/2012  . Depression with anxiety 01/17/2012  . Ovarian cyst 01/17/2012    1995  BSO  . Pneumonia 01/17/2012    10 yrs ago and 5 years ago   . Rash and other nonspecific skin eruption 01/17/2012    Dorsum hands & extensor surfaces arms; intermittent  . Anxiety   . GERD (gastroesophageal reflux disease)   . Personal history of colonic polyps 11/07/2007  . Cancer 1982    ovarian  . Cancer of cervix    Past Surgical History  Procedure Laterality Date  . Total hip arthroplasty  09/06/2011    Procedure: TOTAL HIP ARTHROPLASTY;  Surgeon: Ninetta Lights, MD;  Location: [place];  Service: Orthopedics;  Laterality: Left;  TOTAL HIP ARTHROPLASTY LEFT SIDE  . Ovarian cyst removal  1997    -BSO  . Total hip arthroplasty   10/11/2011    Procedure: TOTAL HIP ARTHROPLASTY;  Surgeon: Ninetta Lights, MD;  Location: [place];  Service: Orthopedics;  Laterality: Right;  120 MINUTES FOR THIS SURGERY  . Colonoscopy  2009    [place]-polyps, 5 yr FU  . Mesenteric artery bypass  09/25/2012    Procedure: MESENTERIC ARTERY BYPASS;  Surgeon: Elam Dutch, MD;  Location: [facility] OR;  Service: Vascular;  Laterality: N/A;  Bypass using Hemashield Gold Vascular Graft 20mm x 9mm x 40cm  . Celiac artery bypass  09/25/2012    Procedure: CELIAC ARTERY BYPASS;  Surgeon: Elam Dutch, MD;  Location: [facility] OR;  Service: Vascular;  Laterality: N/A;  Bypass using Hemashield Gold Vascular Graft 12mm x 33mm x 40cm  . Wound exploration  10/02/2012    Procedure: WOUND EXPLORATION;  Surgeon: Elam Dutch, MD;  Location: [facility] OR;  Service: Vascular;  Laterality: N/A;  abdominal wound exploration  . Abdominal hysterectomy  1982    ovaries retained--for CIS  . Joint replacement     Family History  Problem Relation Age of Onset  . Anesthesia problems Neg Hx   . Hypotension Neg Hx   . Malignant hyperthermia Neg Hx   . Pseudochol deficiency Neg Hx   . Colon cancer  Neg Hx   . Esophageal cancer Neg Hx   . Rectal cancer Neg Hx   . Stomach cancer Neg Hx   . Breast cancer Sister 28    dec age 54  . Cancer Sister   . Cancer Mother     lung  . Hypertension Son   . Heart failure Father   . Ovarian cancer Paternal Grandmother     dec age 75   History  Substance Use Topics  . Smoking status: Current Every Day Smoker -- 1.00 packs/day for 40 years    Types: Cigarettes    Last Attempt to Quit: 07/26/2012  . Smokeless tobacco: Never Used     Comment: pt states that she wants to quit but needs help was advised to call 1-800-QUITNOW and to speak with PCP about avaliable aids  . Alcohol Use: No   OB History   Grav Para Term Preterm Abortions TAB SAB Ect Mult Living   2 1 1  1  1   1      Review of Systems Negative except as stated in  HPI   Allergies  Sulfa antibiotics  Home Medications   Prior to Admission medications   Medication Sig Start Date End Date Taking? Authorizing Provider  albuterol (PROVENTIL HFA;VENTOLIN HFA) 108 (90 BASE) MCG/ACT inhaler Inhale 1-2 puffs into the lungs every 6 (six) hours as needed for wheezing or shortness of breath. 12/10/13   Maudry Diego, MD  CALCIUM PO Take 1 tablet by mouth 2 (two) times daily.    Historical Provider, MD  clonazePAM (KLONOPIN) 0.5 MG tablet Take 0.75 mg by mouth 2 (two) times daily as needed for anxiety.    Historical Provider, MD  estradiol (ESTRACE) 0.5 MG tablet Take 2 tablets (1 mg total) by mouth daily. 02/05/14   [place], MD  Fish Oil-Cholecalciferol (FISH OIL + D3 PO) Take 1 tablet by mouth at bedtime.    Historical Provider, MD  hydrochlorothiazide (HYDRODIURIL) 25 MG tablet Take 12.5 mg by mouth daily.    Historical Provider, MD  levothyroxine (SYNTHROID, LEVOTHROID) 112 MCG tablet Take 112 mcg by mouth daily.    Historical Provider, MD  Multiple Vitamin (MULTIVITAMIN) capsule Take 1 capsule by mouth daily.    Historical Provider, MD  omeprazole (PRILOSEC) 40 MG capsule Take 40 mg by mouth as needed (heartburn).     Historical Provider, MD  venlafaxine (EFFEXOR-XR) 150 MG 24 hr capsule Take 300 mg by mouth daily with breakfast.     Historical Provider, MD   BP 146/65  Pulse 73  Temp(Src) 97.8 F (36.6 C) (Oral)  Resp 18  Ht 5\' 7"  (1.702 m)  Wt 160 lb (72.576 kg)  BMI 25.05 kg/m2  SpO2 100%  LMP 10/23/1980 Physical Exam  Nursing note and vitals reviewed. Constitutional: She is oriented to person, place, and time. She appears well-developed and well-nourished. No distress.  HENT:  Head: Normocephalic.  Eyes: Conjunctivae and EOM are normal.  Neck: Neck supple.  Cardiovascular: Normal rate and regular rhythm.   Pulmonary/Chest: Effort normal and breath sounds normal. No respiratory distress.  Abdominal: Soft. Bowel  sounds are normal. There is no tenderness.  Musculoskeletal: Normal range of motion.       Back:  Tenderness, spasm and ecchymosis noted posterior right ribs.  Neurological: She is alert and oriented to person, place, and time. No cranial nerve deficit.  Skin: Skin is warm and dry.  Psychiatric: She has a normal mood  and affect. Her behavior is normal.    ED Course  Procedures Dg Ribs Unilateral W/chest Right  03/25/2014   CLINICAL DATA:  [place] steps, right lower rib pain  EXAM: RIGHT RIBS AND CHEST - 3+ VIEW  COMPARISON:  12/10/2013  FINDINGS: Normal mediastinum and cardiac silhouette. Costophrenic angles are clear. No effusion, infiltrate, pneumothorax. Dedicated views of the right ribs demonstrate no displaced fracture. No pneumothorax.  IMPRESSION: 1.  No acute cardiopulmonary process.  . 2. No evidence of right rib fracture or pneumothorax.   Electronically Signed   By: Suzy Bouchard M.D.   On: 03/25/2014 11:46     MDM  58 y.o. female with right rib pain s/p fall yesterday. Stable for discharge without shortness of breath. O2 SAT 100% on R/A. Will treat for contusion to ribs. I have reviewed this patient's vital signs, nurses notes, appropriate labs and imaging.  I have discussed findings and plan of care with the patient and she voices understanding.    Medication List    TAKE these medications       cyclobenzaprine 5 MG tablet  Commonly known as:  FLEXERIL  Take 1 tablet (5 mg total) by mouth 3 (three) times daily as needed for muscle spasms.     HYDROcodone-acetaminophen 5-325 MG per tablet  Commonly known as:  NORCO/VICODIN  Take 1 tablet by mouth every 4 (four) hours as needed.     ibuprofen 600 MG tablet  Commonly known as:  ADVIL,MOTRIN  Take 1 tablet (600 mg total) by mouth every 6 (six) hours as needed.      ASK your doctor about these medications       albuterol 108 (90 BASE) MCG/ACT inhaler  Commonly known as:  PROVENTIL HFA;VENTOLIN HFA  Inhale 1-2 puffs into  the lungs every 6 (six) hours as needed for wheezing or shortness of breath.     CALCIUM PO  Take 1 tablet by mouth 2 (two) times daily.     clonazePAM 0.5 MG tablet  Commonly known as:  KLONOPIN  Take 0.75 mg by mouth 2 (two) times daily as needed for anxiety.     estradiol 0.5 MG tablet  Commonly known as:  ESTRACE  Take 2 tablets (1 mg total) by mouth daily.     FISH OIL PO  Take 1 capsule by mouth daily.     hydrochlorothiazide 25 MG tablet  Commonly known as:  HYDRODIURIL  Take 12.5 mg by mouth daily.     levothyroxine 112 MCG tablet  Commonly known as:  SYNTHROID, LEVOTHROID  Take 112 mcg by mouth daily.     multivitamins ther. w/minerals Tabs tablet  Take 1 tablet by mouth daily.     omeprazole 40 MG capsule  Commonly known as:  PRILOSEC  Take 40 mg by mouth daily.     venlafaxine XR 150 MG 24 hr capsule  Commonly known as:  EFFEXOR-XR  Take 300 mg by mouth daily with breakfast.           Ashley Murrain, NP 03/25/14 1659

## 2014-03-26 NOTE — ED Provider Notes (Signed)
Medical screening examination/treatment/procedure(s) were performed by non-physician practitioner and as supervising physician I was immediately available for consultation/collaboration.  Richarda Blade, MD 03/26/14 [phone]

## 2014-03-27 ENCOUNTER — Ambulatory Visit: Payer: Medicare HMO | Attending: Gynecology | Admitting: Gynecology

## 2014-03-27 ENCOUNTER — Encounter: Payer: Self-pay | Admitting: Gynecology

## 2014-03-27 VITALS — BP 139/78 | HR 58 | Temp 98.0°F | Ht 67.99 in | Wt 169.0 lb

## 2014-03-27 DIAGNOSIS — Z79899 Other long term (current) drug therapy: Secondary | ICD-10-CM | POA: Insufficient documentation

## 2014-03-27 DIAGNOSIS — E039 Hypothyroidism, unspecified: Secondary | ICD-10-CM | POA: Insufficient documentation

## 2014-03-27 DIAGNOSIS — Z90710 Acquired absence of both cervix and uterus: Secondary | ICD-10-CM

## 2014-03-27 DIAGNOSIS — I10 Essential (primary) hypertension: Secondary | ICD-10-CM | POA: Insufficient documentation

## 2014-03-27 DIAGNOSIS — Z860100 Personal history of colon polyps, unspecified: Secondary | ICD-10-CM | POA: Insufficient documentation

## 2014-03-27 DIAGNOSIS — N900 Mild vulvar dysplasia: Secondary | ICD-10-CM

## 2014-03-27 DIAGNOSIS — K219 Gastro-esophageal reflux disease without esophagitis: Secondary | ICD-10-CM | POA: Insufficient documentation

## 2014-03-27 DIAGNOSIS — D072 Carcinoma in situ of vagina: Secondary | ICD-10-CM

## 2014-03-27 NOTE — Patient Instructions (Addendum)
You will follow up with Dr. Denman George in 3 months  Fluorouracil, 5-FU skin cream or solution What is this medicine? FLUOROURACIL, 5-FU (flure oh YOOR a sil) is a chemotherapy agent. It is used on the skin to treat skin cancer and skin conditions that could become cancer. This medicine may be used for other purposes; ask your health care provider or pharmacist if you have questions. COMMON BRAND NAME(S): Carac, Efudex, Fluoroplex What should I tell my health care provider before I take this medicine? They need to know if you have any of these conditions: -DPD enzyme deficiency -recent or current radiation therapy -swelling or open sores at the treatment site -an unusual or allergic reaction to fluorouracil, other chemotherapy, other medicines, foods, dyes, or preservatives -pregnant or trying to get pregnant -breast-feeding How should I use this medicine? This medicine is only for use on the skin. Follow the directions on the prescription label. Wash hands before and after use. Wash affected area and gently pat dry. To apply this medicine use a cotton-tipped applicator, or use gloves if applying with fingertips. If applied with unprotected fingertips, it is very important to wash your hands well after you apply this medicine. Avoid applying to the eyes, nose, or mouth. Apply enough medicine to cover the affected area. You can cover the area with a light gauze dressing, but do not use tight or air-tight dressings. Finish the full course prescribed by your doctor or health care professional, even if you think your condition is better. Do not stop taking except on the advice of your doctor or health care professional. Talk to your pediatrician regarding the use of this medicine in children. Special care may be needed. Overdosage: If you think you have taken too much of this medicine contact a poison control center or emergency room at once. NOTE: This medicine is only for you. Do not share this medicine  with others. What if I miss a dose? If you miss a dose, apply it as soon as you can. If it is almost time for your next dose, only use that dose. Do not apply extra doses. Contact your doctor or health care professional if you miss more than one dose. What may interact with this medicine? Interactions are not expected. Do not use any other skin products without telling your doctor or health care professional. This list may not describe all possible interactions. Give your health care provider a list of all the medicines, herbs, non-prescription drugs, or dietary supplements you use. Also tell them if you smoke, drink alcohol, or use illegal drugs. Some items may interact with your medicine. What should I watch for while using this medicine? Visit your doctor or health care professional for checks on your progress. You will need to use this medicine for 2 to 6 weeks. This may be longer depending on the condition being treated. You may not see full healing for another 1 to 2 months after you stop using the medicine. Treated areas of skin can look unsightly during and for several weeks after treatment with this medicine. This medicine can make you more sensitive to the sun. Keep out of the sun. If you cannot avoid being in the sun, wear protective clothing and use sunscreen. Do not use sun lamps or tanning beds/booths. What side effects may I notice from receiving this medicine? Side effects that you should report to your doctor or health care professional as soon as possible: -severe redness and swelling of normal skin Side effects  that usually do not require medical attention (report to your doctor or health care professional if they continue or are bothersome): -dark colored skin -eye irritation including burning, itching, sensitivity, stinging, or watering -increased sensitivity of the skin to sun and ultraviolet light -pain and burning of the affected area -scaling or swelling of the affected  area -skin rash, itching of the affected area -tenderness This list may not describe all possible side effects. Call your doctor for medical advice about side effects. You may report side effects to FDA at 1-800-FDA-1088. Where should I keep my medicine? Keep out of the reach of children. Store at room temperature between 15 and 30 degrees C (59 and 86 degrees F). Do not freeze. Keep container tightly closed. Throw away any unused medicine after the expiration date. NOTE: This sheet is a summary. It may not cover all possible information. If you have questions about this medicine, talk to your doctor, pharmacist, or health care provider.  2014, Elsevier/Gold Standard. (2008-02-12 13:57:20)  Conjugated Estrogens vaginal cream What is this medicine? CONJUGATED ESTROGENS (CON ju gate ed ESS troe jenz) are a mixture of female hormones. This cream can help relieve symptoms associated with menopause.like vaginal dryness and irritation. This medicine may be used for other purposes; ask your health care provider or pharmacist if you have questions. COMMON BRAND NAME(S): Premarin What should I tell my health care provider before I take this medicine? They need to know if you have any of these conditions: -abnormal vaginal bleeding -blood vessel disease or blood clots -breast, cervical, endometrial, or uterine cancer -dementia -diabetes -gallbladder disease -heart disease or recent heart attack -high blood pressure -high cholesterol -high level of calcium in the blood -hysterectomy -kidney disease -liver disease -migraine headaches -protein C deficiency -protein S deficiency -stroke -systemic lupus erythematosus (SLE) -tobacco smoker -an unusual or allergic reaction to estrogens other medicines, foods, dyes, or preservatives -pregnant or trying to get pregnant -breast-feeding How should I use this medicine? This medicine is for use in the vagina only. Do not take by mouth. Follow the  directions on the prescription label. Use at bedtime unless otherwise directed by your doctor or health care professional. Use the special applicator supplied with the cream. Wash hands before and after use. Fill the applicator with the cream and remove from the tube. Lie on your back, part and bend your knees. Insert the applicator into the vagina and push the plunger to expel the cream into the vagina. Wash the applicator with warm soapy water and rinse well. Use exactly as directed for the complete length of time prescribed. Do not stop using except on the advice of your doctor or health care professional. Talk to your pediatrician regarding the use of this medicine in children. Special care may be needed. A patient package insert for the product will be given with each prescription and refill. Read this sheet carefully each time. The sheet may change frequently. Overdosage: If you think you have taken too much of this medicine contact a poison control center or emergency room at once. NOTE: This medicine is only for you. Do not share this medicine with others. What if I miss a dose? If you miss a dose, use it as soon as you can. If it is almost time for your next dose, use only that dose. Do not use double or extra doses. What may interact with this medicine? Do not take this medicine with any of the following medications: -aromatase inhibitors like aminoglutethimide, anastrozole,  exemestane, letrozole, testolactone This medicine may also interact with the following medications: -barbiturates used for inducing sleep or treating seizures -carbamazepine -grapefruit juice -medicines for fungal infections like itraconazole and ketoconazole -raloxifene or tamoxifen -rifabutin -rifampin -rifapentine -ritonavir -some antibiotics used to treat infections -St. John's Wort -warfarin This list may not describe all possible interactions. Give your health care provider a list of all the medicines,  herbs, non-prescription drugs, or dietary supplements you use. Also tell them if you smoke, drink alcohol, or use illegal drugs. Some items may interact with your medicine. What should I watch for while using this medicine? Visit your health care professional for regular checks on your progress. You will need a regular breast and pelvic exam. You should also discuss the need for regular mammograms with your health care professional, and follow his or her guidelines. This medicine can make your body retain fluid, making your fingers, hands, or ankles swell. Your blood pressure can go up. Contact your doctor or health care professional if you feel you are retaining fluid. If you have any reason to think you are pregnant; stop taking this medicine at once and contact your doctor or health care professional. Tobacco smoking increases the risk of getting a blood clot or having a stroke, especially if you are more than 57 years old. You are strongly advised not to smoke. If you wear contact lenses and notice visual changes, or if the lenses begin to feel uncomfortable, consult your eye care specialist. If you are going to have elective surgery, you may need to stop taking this medicine beforehand. Consult your health care professional for advice prior to scheduling the surgery. What side effects may I notice from receiving this medicine? Side effects that you should report to your doctor or health care professional as soon as possible: -allergic reactions like skin rash, itching or hives, swelling of the face, lips, or tongue -breast tissue changes or discharge -changes in vision -chest pain -confusion, trouble speaking or understanding -dark urine -general ill feeling or flu-like symptoms -light-colored stools -nausea, vomiting -pain, swelling, warmth in the leg -right upper belly pain -severe headaches -shortness of breath -sudden numbness or weakness of the face, arm or leg -trouble walking,  dizziness, loss of balance or coordination -unusual vaginal bleeding -yellowing of the eyes or skin Side effects that usually do not require medical attention (report to your doctor or health care professional if they continue or are bothersome): -hair loss -increased hunger or thirst -increased urination -symptoms of vaginal infection like itching, irritation or unusual discharge -unusually weak or tired This list may not describe all possible side effects. Call your doctor for medical advice about side effects. You may report side effects to FDA at 1-800-FDA-1088. Where should I keep my medicine? Keep out of the reach of children. Store at room temperature between 15 and 30 degrees C (59 and 86 degrees F). Throw away any unused medicine after the expiration date. NOTE: This sheet is a summary. It may not cover all possible information. If you have questions about this medicine, talk to your doctor, pharmacist, or health care provider.  2014, Elsevier/Gold Standard. (2011-01-11 09:20:36)

## 2014-03-27 NOTE — Progress Notes (Signed)
Consult Note: Gyn-Onc   Dorothy [name] 58 y.o. female  Chief Complaint  Patient presents with  . VIN    New Consult    Assessment : High-grade vaginal intraepithelial neoplasia.  Plan: Treatment options discussed with the patient. Given the multifocal nature of the lesions I think it best to treat this with intravaginal Efudex. She'll use Efudex for 4 consecutive nights this month and repeat again in one month. She returned for reevaluation in 3 months. Instruction sheet is given and questions are answered.  HPI: 58 year old white female seen in consultation request of Dr. Josefa Half regarding management of a newly diagnosed vaginal intraepithelial neoplasia grade 3. Patient has a relevant history dating back to when she was 61. 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 She claims she's had subsequent annual Pap smears were normal. Review of the medical record reveals a last Pap smear recorded was in December 2009 which was negative.  More recently, the patient had a Pap smear showing high-grade dysplasia. She subsequently underwent colposcopy with directed biopsies revealing severe dysplasia of the upper vagina. Patient denies any vaginal discharge or bleeding. 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  Review of Systems:10 point review of systems is negative except as noted in interval history.   Vitals: Blood pressure 139/78, pulse 58, temperature 98 F (36.7 C), temperature source Oral, height 5' 7.99" (1.727 m), weight 169 lb (76.658 kg), last menstrual period 10/23/1980, SpO2 100.00%.  Physical Exam: General : The patient is a healthy woman in no acute distress.  HEENT: normocephalic, extraoccular movements normal; neck is supple without thyromegally  Lynphnodes: Supraclavicular and inguinal nodes not enlarged  Abdomen: Soft, non-tender, no ascites, no  organomegally, no masses, no hernias  Pelvic:  EGBUS: Normal female  Vagina: Normal, no lesions  Urethra and Bladder: Normal, non-tender  Cervix: Surgically absent  Uterus: Surgically absent  Bi-manual examination: Non-tender; no adenxal masses or nodularity  Rectal: normal sphincter tone, no masses, no blood  Lower extremities: No edema or varicosities. Normal range of motion  Procedure note:  Colposcopic examination of the entire vagina reveals acetowhite areas in the upper vagina and vaginal cuff. These are multifocal. I do not see any lesions in the lower vagina.      Allergies  Allergen Reactions  . Sulfa Antibiotics Rash    Past Medical History  Diagnosis Date  . Constipation   . Hypertension     takes meds  . Hypothyroidism     on Levonthyroxine  . Pneumonia     x 2- last time > 5 years ago  . Depression     Takes Klonopin  . Arthritis     hands and Knees  . Avascular necrosis of bones of both hips 01/17/2012  . Hypothyroidism (acquired) 01/17/2012    Since age 28  . Benign essential HTN 01/17/2012  . Depression with anxiety 01/17/2012  . Ovarian cyst 01/17/2012    1995  BSO  . Pneumonia 01/17/2012    10 yrs ago and 5 years ago   . Rash and other nonspecific skin eruption 01/17/2012    Dorsum hands & extensor surfaces arms; intermittent  . Anxiety   . GERD (gastroesophageal reflux disease)   . Personal history of colonic polyps 11/07/2007  . Cancer 1982    ovarian  . Cancer of cervix     Past Surgical History  Procedure Laterality Date  . Total hip arthroplasty  09/06/2011    Procedure: TOTAL HIP ARTHROPLASTY;  Surgeon: Ninetta Lights, MD;  Location: [facility];  Service: Orthopedics;  Laterality: Left;  TOTAL HIP ARTHROPLASTY LEFT SIDE  . Ovarian cyst removal  1997    -BSO  . Total hip arthroplasty  10/11/2011    Procedure: TOTAL HIP ARTHROPLASTY;  Surgeon: Ninetta Lights, MD;  Location: [facility];  Service: Orthopedics;  Laterality: Right;  120 MINUTES FOR THIS  SURGERY  . Colonoscopy  2009    [place]-polyps, 5 yr FU  . 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 OR;  Service: Vascular;  Laterality: N/A;  Bypass using Hemashield Gold Vascular Graft 27mm x 66mm x 40cm  . 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 Wound exploration  10/02/2012    Procedure: WOUND EXPLORATION;  Surgeon: Elam Dutch, MD;  Location: [facility] OR;  Service: Vascular;  Laterality: N/A;  abdominal wound exploration  . Abdominal hysterectomy  1982    ovaries retained--for CIS  . Joint replacement      Current Outpatient Prescriptions  Medication Sig Dispense Refill  . albuterol (PROVENTIL HFA;VENTOLIN HFA) 108 (90 BASE) MCG/ACT inhaler Inhale 1-2 puffs into the lungs every 6 (six) hours as needed for wheezing or shortness of breath.  1 Inhaler  0  . CALCIUM PO Take 1 tablet by mouth 2 (two) times daily.      . clonazePAM (KLONOPIN) 0.5 MG tablet Take 0.75 mg by mouth 2 (two) times daily as needed for anxiety.      . cyclobenzaprine (FLEXERIL) 5 MG tablet Take 1 tablet (5 mg total) by mouth 3 (three) times daily as needed for muscle spasms.  30 tablet  0  . estradiol (ESTRACE) 0.5 MG tablet Take 2 tablets (1 mg total) by mouth daily.  90 tablet  1  . hydrochlorothiazide (HYDRODIURIL) 25 MG tablet Take 12.5 mg by mouth daily.      Marland Kitchen HYDROcodone-acetaminophen (NORCO/VICODIN) 5-325 MG per tablet Take 1 tablet by mouth every 4 (four) hours as needed.  20 tablet  0  . ibuprofen (ADVIL,MOTRIN) 600 MG tablet Take 1 tablet (600 mg total) by mouth every 6 (six) hours as needed.  30 tablet  0  . levothyroxine (SYNTHROID, LEVOTHROID) 112 MCG tablet Take 112 mcg by mouth daily.      . Multiple Vitamins-Minerals (MULTIVITAMINS THER.  W/MINERALS) TABS tablet Take 1 tablet by mouth daily.      . Omega-3 Fatty Acids (FISH OIL PO) Take 1 capsule by mouth daily.      Marland Kitchen omeprazole (PRILOSEC) 40 MG capsule Take 40 mg by mouth daily.       Marland Kitchen venlafaxine (EFFEXOR-XR) 150 MG 24 hr capsule Take 300 mg by mouth daily with breakfast.        No current facility-administered medications for this visit.    History   Social History  . Marital Status: Widowed    Spouse Name: N/A    Number of Children: 1  . Years of Education: N/A   Occupational History  . disabled    Social History Main Topics  . Smoking status: Current Every Day Smoker -- 1.00 packs/day for 40  years    Types: Cigarettes    Last Attempt to Quit: 07/26/2012  . Smokeless tobacco: Never Used     Comment: pt states that she wants to quit but needs help was advised to call 1-800-QUITNOW and to speak with PCP about avaliable aids  . Alcohol Use: No  . Drug Use: No  . Sexual Activity: Yes    Partners: Male    Birth Control/ Protection: Surgical     Comment: TVH/ovaries retained   Other Topics Concern  . Not on file   Social History Narrative  . No narrative on file    Family History  Problem Relation Age of Onset  . Anesthesia problems Neg Hx   . Hypotension Neg Hx   . Malignant hyperthermia Neg Hx   . Pseudochol deficiency Neg Hx   . Colon cancer Neg Hx   . Esophageal cancer Neg Hx   . Rectal cancer Neg Hx   . Stomach cancer Neg Hx   . Breast cancer Sister 39    dec age 31  . Cancer Sister   . Cancer Mother     lung  . Hypertension Son   . Heart failure Father   . Ovarian cancer Paternal Grandmother     dec age 63      Alvino Chapel, MD 03/27/2014, 10:10 AM

## 2014-04-23 ENCOUNTER — Encounter: Payer: Self-pay | Admitting: Gynecologic Oncology

## 2014-04-23 ENCOUNTER — Ambulatory Visit: Payer: Medicare HMO | Attending: Gynecologic Oncology | Admitting: Gynecologic Oncology

## 2014-04-23 VITALS — BP 133/72 | HR 72 | Temp 98.2°F | Resp 18 | Ht 67.0 in | Wt 164.5 lb

## 2014-04-23 DIAGNOSIS — F3289 Other specified depressive episodes: Secondary | ICD-10-CM | POA: Insufficient documentation

## 2014-04-23 DIAGNOSIS — Z96649 Presence of unspecified artificial hip joint: Secondary | ICD-10-CM | POA: Insufficient documentation

## 2014-04-23 DIAGNOSIS — D072 Carcinoma in situ of vagina: Secondary | ICD-10-CM | POA: Insufficient documentation

## 2014-04-23 DIAGNOSIS — K219 Gastro-esophageal reflux disease without esophagitis: Secondary | ICD-10-CM | POA: Insufficient documentation

## 2014-04-23 DIAGNOSIS — Z79899 Other long term (current) drug therapy: Secondary | ICD-10-CM | POA: Insufficient documentation

## 2014-04-23 DIAGNOSIS — I10 Essential (primary) hypertension: Secondary | ICD-10-CM | POA: Insufficient documentation

## 2014-04-23 DIAGNOSIS — N893 Dysplasia of vagina, unspecified: Secondary | ICD-10-CM

## 2014-04-23 DIAGNOSIS — F329 Major depressive disorder, single episode, unspecified: Secondary | ICD-10-CM | POA: Insufficient documentation

## 2014-04-23 DIAGNOSIS — N900 Mild vulvar dysplasia: Secondary | ICD-10-CM

## 2014-04-23 DIAGNOSIS — F411 Generalized anxiety disorder: Secondary | ICD-10-CM | POA: Insufficient documentation

## 2014-04-23 DIAGNOSIS — E039 Hypothyroidism, unspecified: Secondary | ICD-10-CM | POA: Insufficient documentation

## 2014-04-23 MED ORDER — LIDOCAINE HCL 2 % EX GEL: CUTANEOUS | Status: DC

## 2014-04-23 MED ORDER — CLOTRIMAZOLE-BETAMETHASONE 1-0.05 % EX CREA: TOPICAL_CREAM | CUTANEOUS | Status: DC

## 2014-04-23 NOTE — Progress Notes (Signed)
Consult Note: Gyn-Onc   Dorothy [name] 58 y.o. female  Chief Complaint  Patient presents with  . VIN    Work in appointment    Assessment : High-grade vaginal intraepithelial neoplasia. She is status post her first cycle of Efudex with some vaginal irritation.  Plan: She'll make a mixture of topical lidocaine as well as Lotrisone ointment as directed and apply to the affected area. She will also use sitz baths as well as when she voids to decrease external irritation. There is no irritation deep in the vagina on speculum examination. I believe she could be a candidate for continued Efudex treatment but I counseled her that she may want to use a barrier protection just at the introitus. She has not been douching first thing in the morning but was getting up out of bed walking around with the Efudex and I believe this caused the external irritation she's currently experiencing. She is allergic to sulfa therefore we did not use Silvadene.  HPI: 58 year old white female seen in consultation request of Dr. Josefa Half regarding management of a newly diagnosed vaginal intraepithelial neoplasia grade 3. Patient has a relevant history dating back to when she was 27. 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 She claims she's had subsequent annual Pap smears were normal. Review of the medical record reveals a last Pap smear record  ed was in December 2009 which was negative.  More recently, the patient had a Pap smear showing high-grade dysplasia. She subsequently underwent colposcopy with directed biopsies revealing severe dysplasia of the upper vagina. Patient denies any vaginal discharge or bleeding. 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  Review of Systems: 10 point review of systems is negative except as noted in interval history.   Vitals: Blood pressure 133/72,  pulse 72, temperature 98.2 F (36.8 C), resp. rate 18, height 5\' 7"  (1.702 m), weight 164 lb 8 oz (74.617 kg), last menstrual period 10/23/1980.  Physical Exam: General : The patient is a healthy woman in no acute distress.   External genitalia normal. Just inside the introitus bilaterally there is significant erythema with some desquamation on the right greater than left. Speculum examination was performed in the vagina is well epithelialized with no desquamation.   Allergies  Allergen Reactions  . Sulfa Antibiotics Rash    Past Medical History  Diagnosis Date  . Constipation   . Hypertension     takes meds  . Hypothyroidism     on Levonthyroxine  . Pneumonia     x 2- last time > 5 years ago  . Depression     Takes Klonopin  . Arthritis     hands and Knees  . Avascular necrosis of bones of both hips 01/17/2012  . Hypothyroidism (acquired) 01/17/2012    Since age 25  . Benign essential HTN 01/17/2012  . Depression with anxiety 01/17/2012  . Ovarian cyst 01/17/2012    1995  BSO  . Pneumonia 01/17/2012    10 yrs ago and 5 years ago   . Rash and other nonspecific skin eruption 01/17/2012    Dorsum hands & extensor surfaces arms; intermittent  . Anxiety   . GERD (gastroesophageal reflux disease)   . Personal history of colonic polyps 11/07/2007  . Cancer 1982    ovarian  . Cancer of cervix     Past Surgical History  Procedure  Laterality Date  . Total hip arthroplasty  09/06/2011    Procedure: TOTAL HIP ARTHROPLASTY;  Surgeon: Ninetta Lights, MD;  Location: [place];  Service: Orthopedics;  Laterality: Left;  TOTAL HIP ARTHROPLASTY LEFT SIDE  . Ovarian cyst removal  1997    -BSO  . Total hip arthroplasty  10/11/2011    Procedure: TOTAL HIP ARTHROPLASTY;  Surgeon: Ninetta Lights, MD;  Location: [place];  Service: Orthopedics;  Laterality: Right;  120 MINUTES FOR THIS SURGERY  . Colonoscopy  2009    Arnot-polyps, 5 yr FU  . 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 Wound exploration  10/02/2012    Procedure: WOUND EXPLORATION;  Surgeon: Elam Dutch, MD;  Location: [facility] OR;  Service: Vascular;  Laterality: N/A;  abdominal wound exploration  . Abdominal hysterectomy  1982    ovaries retained--for CIS  . Joint replacement      Current Outpatient Prescriptions  Medication Sig Dispense Refill  . albuterol (PROVENTIL HFA;VENTOLIN HFA) 108 (90 BASE) MCG/ACT inhaler Inhale 1-2 puffs into the lungs every 6 (six) hours as needed for wheezing or shortness of breath.  1 Inhaler  0  . CALCIUM PO Take 1 tablet by mouth 2 (two) times daily.      . clonazePAM (KLONOPIN) 0.5 MG tablet Take 0.75 mg by mouth 2 (two) times daily as needed for anxiety.      . conjugated estrogens (PREMARIN) vaginal cream Place 1 Applicatorful vaginally daily.      . cyclobenzaprine (FLEXERIL) 5 MG tablet Take 1 tablet (5 mg total) by mouth 3 (three) times daily as needed for muscle spasms.  30 tablet  0  . estradiol (ESTRACE) 0.5 MG tablet Take 2 tablets (1 mg total) by mouth daily.  90 tablet  1  . fluorouracil (EFUDEX) 5 % cream Apply topically 2 (two) times daily. Use for 4 consecutive nights then stop. Repeat for 2 additional months      . hydrochlorothiazide (HYDRODIURIL) 25 MG tablet Take 12.5 mg by mouth daily.      Marland Kitchen HYDROcodone-acetaminophen (NORCO/VICODIN) 5-325 MG per tablet Take 1 tablet by mouth every 4 (four) hours as needed.  20 tablet  0  . ibuprofen (ADVIL,MOTRIN) 600 MG tablet Take 1 tablet (600 mg total) by mouth every 6 (six) hours as needed.  30 tablet  0  . levothyroxine  (SYNTHROID, LEVOTHROID) 112 MCG tablet Take 112 mcg by mouth daily.      . Multiple Vitamins-Minerals (MULTIVITAMINS THER. W/MINERALS) TABS tablet Take 1 tablet by mouth daily.      . Omega-3 Fatty Acids (FISH OIL PO) Take 1 capsule by mouth daily.      Marland Kitchen omeprazole (PRILOSEC) 40 MG capsule Take 40 mg by mouth daily.       Marland Kitchen venlafaxine (EFFEXOR-XR) 150 MG 24 hr capsule Take 300 mg by mouth daily with breakfast.        No current facility-administered medications for this visit.    History   Social History  . Marital Status: Widowed  Spouse Name: N/A    Number of Children: 1  . Years of Education: N/A   Occupational History  . disabled    Social History Main Topics  . Smoking status: Current Every Day Smoker -- 1.00 packs/day for 40 years    Types: Cigarettes    Last Attempt to Quit: 07/26/2012  . Smokeless tobacco: Never Used     Comment: pt states that she wants to quit but needs help was advised to call 1-800-QUITNOW and to speak with PCP about avaliable aids  . Alcohol Use: No  . Drug Use: No  . Sexual Activity: Yes    Partners: Male    Birth Control/ Protection: Surgical     Comment: TVH/ovaries retained   Other Topics Concern  . Not on file   Social History Narrative  . No narrative on file    Family History  Problem Relation Age of Onset  . Anesthesia problems Neg Hx   . Hypotension Neg Hx   . Malignant hyperthermia Neg Hx   . Pseudochol deficiency Neg Hx   . Colon cancer Neg Hx   . Esophageal cancer Neg Hx   . Rectal cancer Neg Hx   . Stomach cancer Neg Hx   . Breast cancer Sister 22    dec age 80  . Cancer Sister   . Cancer Mother     lung  . Hypertension Son   . Heart failure Father   . Ovarian cancer Paternal Grandmother     dec age 44      [place]., MD 04/23/2014, 11:50 AM

## 2014-04-23 NOTE — Patient Instructions (Signed)
Lidocaine jelly What is this medicine? LIDOCAINE (LYE doe kane) is an anesthetic. It causes loss of feeling in the skin and surrounding tissues. It is used to prevent and to treat pain from some procedures. This medicine may be used for other purposes; ask your health care provider or pharmacist if you have questions. COMMON BRAND NAME(S): Anestacon, Glydo, LidoRx, Xylocaine What should I tell my health care provider before I take this medicine? They need to know if you have any of these conditions: -infected, open or damaged skin -an unusual or allergic reaction to lidocaine, other medicines, foods, dyes, or preservatives -pregnant or trying to get pregnant -breast-feeding How should I use this medicine? This medicine is applied to the skin or mucous membranes using finger tips or cotton swabs. It may be applied by a health care professional before a procedure to numb the area. It may also be applied to hemorrhoids for relief of pain. Follow the directions on the prescription label. Do not use more often than directed. Talk to your pediatrician regarding the use of this medicine in children. While this drug may be prescribed for selected conditions, precautions do apply. Overdosage: If you think you have taken too much of this medicine contact a poison control center or emergency room at once. NOTE: This medicine is only for you. Do not share this medicine with others. What if I miss a dose? This does not apply. What may interact with this medicine? -medicines to control heart rhythm. Do not use any other skin products on the affected area without asking your doctor or health care professional. This list may not describe all possible interactions. Give your health care provider a list of all the medicines, herbs, non-prescription drugs, or dietary supplements you use. Also tell them if you smoke, drink alcohol, or use illegal drugs. Some items may interact with your medicine. What should I  watch for while using this medicine? Be careful to avoid injury while the area is numb and you are not aware of pain. If this medicine is used in the mouth or throat, do not chew gum or eat food for at least one hour. If the area is still numb, you may choke or bite your tongue or cheek if you try to chew or swallow. Also, you may not feel pain from hot foods or drinks. Do not apply this medicine to areas of skin that are infected, open, or damaged. This may increase the amount of medicine that passes through your skin and increase the risk of serious side effects. What side effects may I notice from receiving this medicine? Side effects that you should report to your doctor or health care professional as soon as possible: -allergic reactions like skin rash, itching or hives, swelling of the face, lips, or tongue -breathing problems -chest pain, continued irregular heartbeats -headache -seizures -trembling, shaking -unusually weak or tired Side effects that usually do not require medical attention (report to your doctor or health care professional if they continue or are bothersome): -localized numbness This list may not describe all possible side effects. Call your doctor for medical advice about side effects. You may report side effects to FDA at 1-800-FDA-1088. Where should I keep my medicine? Keep out of reach of children. Store at room temperature between 15 and 30 degrees C (59 and 86 degrees F). Do not freeze. Throw away any unused medicine after the expiration date. NOTE: This sheet is a summary. It may not cover all possible information. If you  have questions about this medicine, talk to your doctor, pharmacist, or health care provider.  2015, Elsevier/Gold Standard. (2007-12-26 10:54:13) Betamethasone; Clotrimazole skin cream What is this medicine? BETAMETHASONE; CLOTRIMAZOLE (bay ta METH a sone; kloe TRIM a zole) is a corticosteroid and antifungal cream. It treats ringworm and  infections like jock itch and athlete's foot. It also helps reduce swelling, redness, and itching caused by these infections. This medicine may be used for other purposes; ask your health care provider or pharmacist if you have questions. COMMON BRAND NAME(S): Lotrisone What should I tell my health care provider before I take this medicine? They need to know if you have any of these conditions: -large areas of burned or damaged skin -skin thinning -peripheral vascular disease or poor circulation -an unusual or allergic reaction to betamethasone, clotrimazole, other corticosteroids, other antifungals, other medicines, foods, dyes, or preservatives -pregnant or trying to get pregnant -breast-feeding How should I use this medicine? This cream is for external use only. Do not take by mouth. Follow the directions on the prescription label. Wash your hands before and after use. If treating hand or nail infections, wash hands before use only. Apply a thin layer of cream to the affected area and rub in gently. Do not cover or wrap the treated area with an airtight bandage (like a plastic bandage). Use the cream for the full course of treatment prescribed, even if you think the condition is getting better. Use the medicine at regular intervals. Do not use more often than directed. Do not use on healthy skin or over large areas of skin. Do not use this medicine for any condition other than the one for which it was prescribed. When applying to the groin area, apply a small amount and do not use for longer than 2 weeks unless directed to by your doctor or health care professional. Do not get this cream in your eyes. If you do, rinse out with plenty of cool tap water. Talk to your pediatrician regarding the use of this medicine in children. While this drug may be prescribed for children as young as 17 years for selected conditions, precautions do apply. Patients over 14 years old may have a stronger reaction and  need a smaller dose. Overdosage: If you think you have taken too much of this medicine contact a poison control center or emergency room at once. NOTE: This medicine is only for you. Do not share this medicine with others. What if I miss a dose? If you miss a dose, use it as soon as you can. If it is almost time for your next dose, use only that dose. Do not use double or take extra doses. What may interact with this medicine? -topical products that have nystatin This list may not describe all possible interactions. Give your health care provider a list of all the medicines, herbs, non-prescription drugs, or dietary supplements you use. Also tell them if you smoke, drink alcohol, or use illegal drugs. Some items may interact with your medicine. What should I watch for while using this medicine? If using this medicine on your body or groin tell your doctor or health care professional if your symptoms do not improve within 1 week. If using this medicine on your feet tell your doctor or health care professional if your symptoms do not improve within 2 weeks. Tell your doctor if your skin infection returns after you stop using this cream. If you are using this cream for 'jock itch' be sure to  dry the groin completely after bathing. Do not wear underwear that is tight-fitting or made from synthetic fibers like rayon or nylon. Wear loose-fitting, cotton underwear. If you are using this cream for athlete's foot be sure to dry your feet carefully after bathing, especially between the toes. Do not wear socks made from wool or synthetic materials like rayon or nylon. Wear clean cotton socks and change them at least once a day, change them more if your feet sweat a lot. Also, try to wear sandals or shoes that are well-ventilated. Do not use this cream to treat diaper rash. What side effects may I notice from receiving this medicine? Side effects that you should report to your doctor or health care professional as  soon as possible: -allergic reactions like skin rash, itching or hives, swelling of the face, lips, or tongue -dark red spots on the skin -lack of healing of skin condition -loss of feeling on skin -painful, red, pus-filled blisters in hair follicles -skin infection -sores or blisters that do not heal properly -thinning of the skin or sunburn Side effects that usually do not require medical attention (report to your doctor or health care professional if they continue or are bothersome): -dry or peeling skin -minor skin irritation, burning, or itching This list may not describe all possible side effects. Call your doctor for medical advice about side effects. You may report side effects to FDA at 1-800-FDA-1088. Where should I keep my medicine? Keep out of the reach of children. Store at room temperature between 15 and 30 degrees C ( 59 and 86 degrees F). Do not freeze. Throw away any unused medicine after the expiration date. NOTE: This sheet is a summary. It may not cover all possible information. If you have questions about this medicine, talk to your doctor, pharmacist, or health care provider.  2015, Elsevier/Gold Standard. (2008-01-08 16:14:28)

## 2014-04-27 ENCOUNTER — Encounter (HOSPITAL_COMMUNITY): Payer: Self-pay | Admitting: Emergency Medicine

## 2014-04-27 ENCOUNTER — Emergency Department (HOSPITAL_COMMUNITY): Payer: Medicare HMO

## 2014-04-27 ENCOUNTER — Emergency Department (HOSPITAL_COMMUNITY)
Admission: EM | Admit: 2014-04-27 | Discharge: 2014-04-27 | Disposition: A | Discharge status: Home / Self Care | Payer: Medicare HMO | Attending: Emergency Medicine | Admitting: Emergency Medicine

## 2014-04-27 DIAGNOSIS — F17200 Nicotine dependence, unspecified, uncomplicated: Secondary | ICD-10-CM | POA: Insufficient documentation

## 2014-04-27 DIAGNOSIS — Z8542 Personal history of malignant neoplasm of other parts of uterus: Secondary | ICD-10-CM | POA: Insufficient documentation

## 2014-04-27 DIAGNOSIS — Z8543 Personal history of malignant neoplasm of ovary: Secondary | ICD-10-CM | POA: Insufficient documentation

## 2014-04-27 DIAGNOSIS — Z79899 Other long term (current) drug therapy: Secondary | ICD-10-CM | POA: Insufficient documentation

## 2014-04-27 DIAGNOSIS — Z860100 Personal history of colon polyps, unspecified: Secondary | ICD-10-CM | POA: Insufficient documentation

## 2014-04-27 DIAGNOSIS — F341 Dysthymic disorder: Secondary | ICD-10-CM | POA: Insufficient documentation

## 2014-04-27 DIAGNOSIS — M1710 Unilateral primary osteoarthritis, unspecified knee: Secondary | ICD-10-CM | POA: Insufficient documentation

## 2014-04-27 DIAGNOSIS — J209 Acute bronchitis, unspecified: Secondary | ICD-10-CM | POA: Insufficient documentation

## 2014-04-27 DIAGNOSIS — IMO0002 Reserved for concepts with insufficient information to code with codable children: Secondary | ICD-10-CM | POA: Insufficient documentation

## 2014-04-27 DIAGNOSIS — K5900 Constipation, unspecified: Secondary | ICD-10-CM | POA: Insufficient documentation

## 2014-04-27 DIAGNOSIS — I10 Essential (primary) hypertension: Secondary | ICD-10-CM | POA: Insufficient documentation

## 2014-04-27 DIAGNOSIS — J440 Chronic obstructive pulmonary disease with acute lower respiratory infection: Secondary | ICD-10-CM | POA: Insufficient documentation

## 2014-04-27 DIAGNOSIS — Z872 Personal history of diseases of the skin and subcutaneous tissue: Secondary | ICD-10-CM | POA: Insufficient documentation

## 2014-04-27 DIAGNOSIS — M19049 Primary osteoarthritis, unspecified hand: Secondary | ICD-10-CM | POA: Insufficient documentation

## 2014-04-27 DIAGNOSIS — K219 Gastro-esophageal reflux disease without esophagitis: Secondary | ICD-10-CM | POA: Insufficient documentation

## 2014-04-27 DIAGNOSIS — F411 Generalized anxiety disorder: Secondary | ICD-10-CM | POA: Insufficient documentation

## 2014-04-27 DIAGNOSIS — J40 Bronchitis, not specified as acute or chronic: Secondary | ICD-10-CM

## 2014-04-27 DIAGNOSIS — Z8739 Personal history of other diseases of the musculoskeletal system and connective tissue: Secondary | ICD-10-CM | POA: Insufficient documentation

## 2014-04-27 DIAGNOSIS — Z8701 Personal history of pneumonia (recurrent): Secondary | ICD-10-CM | POA: Insufficient documentation

## 2014-04-27 DIAGNOSIS — E039 Hypothyroidism, unspecified: Secondary | ICD-10-CM | POA: Insufficient documentation

## 2014-04-27 MED ORDER — IPRATROPIUM-ALBUTEROL 0.5-2.5 (3) MG/3ML IN SOLN
3.0000 mL | Freq: Once | RESPIRATORY_TRACT | Status: AC
  Administered 2014-04-27: 3 mL via RESPIRATORY_TRACT
  Filled 2014-04-27: qty 3

## 2014-04-27 MED ORDER — PREDNISONE 10 MG PO TABS: 20.0000 mg | ORAL_TABLET | Freq: Every day | ORAL | Status: DC

## 2014-04-27 MED ORDER — ALBUTEROL SULFATE (2.5 MG/3ML) 0.083% IN NEBU
2.5000 mg | INHALATION_SOLUTION | Freq: Once | RESPIRATORY_TRACT | Status: AC
  Administered 2014-04-27: 2.5 mg via RESPIRATORY_TRACT
  Filled 2014-04-27: qty 3

## 2014-04-27 MED ORDER — AZITHROMYCIN 250 MG PO TABS: ORAL_TABLET | ORAL | Status: DC

## 2014-04-27 MED ORDER — ALBUTEROL SULFATE HFA 108 (90 BASE) MCG/ACT IN AERS: 1.0000 | INHALATION_SPRAY | Freq: Four times a day (QID) | RESPIRATORY_TRACT | Status: DC | PRN

## 2014-04-27 MED ORDER — HYDROCOD POLST-CHLORPHEN POLST 10-8 MG/5ML PO LQCR: 5.0000 mL | Freq: Two times a day (BID) | ORAL | Status: DC | PRN

## 2014-04-27 NOTE — ED Notes (Signed)
Pt O2 sats at 86% RA. Pt placed on 2L O2 via [place] and asked to take some breaths through nose. Sats improved 93% on 2L [place]. Dr. Lacinda Axon notified.

## 2014-04-27 NOTE — ED Notes (Signed)
Respiratory at bedside.

## 2014-04-27 NOTE — ED Notes (Addendum)
Pt reports generalized weakness, cough/congestion, sob x1 week. Pt seen at PCP this am and sent here. nad noted. Pt denies any cp. Pt reports chest tightness with a deep breath. Pt reports intermittent fevers.

## 2014-04-27 NOTE — ED Provider Notes (Signed)
CSN: [id number]     Arrival date & time 04/27/14  1219 History  This chart was scribed for Nat Christen, MD by Elby Beck, ED Scribe. This patient was seen in room APA09/APA09 and the patient's care was started at 12:37 PM.   Chief Complaint  Patient presents with  . Weakness    The history is provided by the patient. No language interpreter was used.    HPI Comments: Dorothy [name] is a 58 y.o. female who presents to the Emergency Department complaining of a gradually worsening cough, productive of clear sputum, over the past week. She reports associated nasal congestion, chest congestion and wheezing over the past week. She states that she was seen buy her PA in [place] this morning for these symptoms, who advised her to be seen at the ED. She states that she was given a breathing treatment by her PA earlier today with some relief. 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 She states that she has a remote history of uterine and ovarian cancer.    Past Medical History  Diagnosis Date  . Constipation   . Hypertension     takes meds  . Hypothyroidism     on Levonthyroxine  . Pneumonia     x 2- last time > 5 years ago  . Depression     Takes Klonopin  . Arthritis     hands and Knees  . Avascular necrosis of bones of both hips 01/17/2012  . Hypothyroidism (acquired) 01/17/2012    Since age 26  . Benign essential HTN 01/17/2012  . Depression with anxiety 01/17/2012  . Ovarian cyst 01/17/2012    1995  BSO  . Pneumonia 01/17/2012    10 yrs ago and 5 years ago   . Rash and other nonspecific skin eruption 01/17/2012    Dorsum hands & extensor surfaces arms; intermittent  . Anxiety   . GERD (gastroesophageal reflux disease)   . Personal history of colonic polyps 11/07/2007  . Cancer 1982    ovarian  . Cancer of cervix    Past Surgical History  Procedure Laterality Date  . Total hip arthroplasty  09/06/2011    Procedure: TOTAL HIP ARTHROPLASTY;  Surgeon:  Ninetta Lights, MD;  Location: [place];  Service: Orthopedics;  Laterality: Left;  TOTAL HIP ARTHROPLASTY LEFT SIDE  . Ovarian cyst removal  1997    -BSO  . Total hip arthroplasty  10/11/2011    Procedure: TOTAL HIP ARTHROPLASTY;  Surgeon: Ninetta Lights, MD;  Location: [place];  Service: Orthopedics;  Laterality: Right;  120 MINUTES FOR THIS SURGERY  . Colonoscopy  2009    [employer]-polyps, 5 yr FU  . Mesenteric artery bypass  09/25/2012    Procedure: MESENTERIC ARTERY BYPASS;  Surgeon: Elam Dutch, MD;  Location: [facility] OR;  Service: Vascular;  Laterality: N/A;  Bypass using Hemashield Gold Vascular Graft 65mm x 33mm x 40cm  . Celiac artery bypass  09/25/2012    Procedure: CELIAC ARTERY BYPASS;  Surgeon: Elam Dutch, MD;  Location: [facility] OR;  Service: Vascular;  Laterality: N/A;  Bypass using Hemashield Gold Vascular Graft 56mm x 33mm x 40cm  . Wound exploration  10/02/2012    Procedure: WOUND EXPLORATION;  Surgeon: Elam Dutch, MD;  Location: [facility] OR;  Service: Vascular;  Laterality: N/A;  abdominal wound exploration  . Abdominal hysterectomy  1982    ovaries retained--for CIS  . Joint replacement     Family  History  Problem Relation Age of Onset  . Anesthesia problems Neg Hx   . Hypotension Neg Hx   . Malignant hyperthermia Neg Hx   . Pseudochol deficiency Neg Hx   . Colon cancer Neg Hx   . Esophageal cancer Neg Hx   . Rectal cancer Neg Hx   . Stomach cancer Neg Hx   . Breast cancer Sister 90    dec age 73  . Cancer Sister   . Cancer Mother     lung  . Hypertension Son   . Heart failure Father   . Ovarian cancer Paternal Grandmother     dec age 53   History  Substance Use Topics  . Smoking status: Current Every Day Smoker -- 1.00 packs/day for 40 years    Types: Cigarettes    Last Attempt to Quit: 07/26/2012  . Smokeless tobacco: Never Used     Comment: pt states that she wants to quit but needs help was advised to call 1-800-QUITNOW and to speak with PCP about  avaliable aids  . Alcohol Use: No   OB History   Grav Para Term Preterm Abortions TAB SAB Ect Mult Living   2 1 1  1  1   1      Review of Systems A complete 10 system review of systems was obtained and all systems are negative except as noted in the HPI and PMH.   Allergies  Sulfa antibiotics  Home Medications   Prior to Admission medications   Medication Sig Start Date End Date Taking? Authorizing Provider  albuterol (PROVENTIL HFA;VENTOLIN HFA) 108 (90 BASE) MCG/ACT inhaler Inhale 1-2 puffs into the lungs every 6 (six) hours as needed for wheezing or shortness of breath. 12/10/13  Yes Maudry Diego, MD  CALCIUM PO Take 1 tablet by mouth 2 (two) times daily.   Yes Historical Provider, MD  clonazePAM (KLONOPIN) 0.5 MG tablet Take 0.75 mg by mouth 2 (two) times daily as needed for anxiety.   Yes Historical Provider, MD  clotrimazole-betamethasone (LOTRISONE) cream Apply to affected area 2 times daily 04/23/14 04/23/15 Yes Paola A. Alycia Rossetti, MD  conjugated estrogens (PREMARIN) vaginal cream Place 1 Applicatorful vaginally daily. After chemo   Yes Historical Provider, MD  estradiol (ESTRACE) 0.5 MG tablet Take 0.5 mg by mouth daily. 02/05/14  Yes [place] Berton Lan, MD  fluorouracil (EFUDEX) 5 % cream Apply topically 2 (two) times daily. Use for 4 consecutive nights then stop. Repeat for 2 additional months 03/27/14  Yes Historical Provider, MD  hydrochlorothiazide (HYDRODIURIL) 25 MG tablet Take 12.5 mg by mouth daily.   Yes Historical Provider, MD  ibuprofen (ADVIL,MOTRIN) 600 MG tablet Take 1 tablet (600 mg total) by mouth every 6 (six) hours as needed. 03/25/14  Yes Hope Bunnie Pion, NP  levothyroxine (SYNTHROID, LEVOTHROID) 112 MCG tablet Take 112 mcg by mouth daily.   Yes Historical Provider, MD  lidocaine (XYLOCAINE JELLY) 2 % jelly Apply to affected area daily prn 04/23/14 04/23/15 Yes Paola A. Alycia Rossetti, MD  Multiple Vitamins-Minerals (MULTIVITAMINS THER. W/MINERALS) TABS tablet Take  1 tablet by mouth daily.   Yes Historical Provider, MD  Omega-3 Fatty Acids (FISH OIL PO) Take 1 capsule by mouth daily.   Yes Historical Provider, MD  omeprazole (PRILOSEC) 40 MG capsule Take 40 mg by mouth daily.    Yes Historical Provider, MD  venlafaxine (EFFEXOR-XR) 150 MG 24 hr capsule Take 300 mg by mouth daily with breakfast.    Yes  Historical Provider, MD  albuterol (PROVENTIL HFA;VENTOLIN HFA) 108 (90 BASE) MCG/ACT inhaler Inhale 1-2 puffs into the lungs every 6 (six) hours as needed for wheezing or shortness of breath. 04/27/14   Nat Christen, MD  azithromycin (ZITHROMAX Z-PAK) 250 MG tablet 2 po day one, then 1 daily x 4 days 04/27/14   Nat Christen, MD  chlorpheniramine-HYDROcodone [facility] ER) 10-8 MG/5ML [facility] Take 5 mLs by mouth every 12 (twelve) hours as needed for cough. 04/27/14   Nat Christen, MD  predniSONE (DELTASONE) 10 MG tablet Take 2 tablets (20 mg total) by mouth daily. 04/27/14   Nat Christen, MD   Triage Vitals: BP 130/89  Pulse 79  Temp(Src) 98.4 F (36.9 C) (Oral)  Resp 18  Ht 5\' 7"  (1.702 m)  Wt 164 lb (74.39 kg)  BMI 25.68 kg/m2  SpO2 94%  LMP 10/23/1980  Physical Exam  Nursing note and vitals reviewed. Constitutional: She is oriented to person, place, and time. She appears well-developed and well-nourished.  HENT:  Head: Normocephalic and atraumatic.  Eyes: Conjunctivae and EOM are normal. Pupils are equal, round, and reactive to light.  Neck: Normal range of motion. Neck supple.  Cardiovascular: Normal rate, regular rhythm and normal heart sounds.   Pulmonary/Chest: Effort normal. She has wheezes (bilateral, expiratory).  Abdominal: Soft. Bowel sounds are normal.  Musculoskeletal: Normal range of motion.  Neurological: She is alert and oriented to person, place, and time.  Skin: Skin is warm and dry.  Psychiatric: She has a normal mood and affect. Her behavior is normal.    ED Course  Procedures (including critical care time)  DIAGNOSTIC  STUDIES: Oxygen Saturation is 94% on RA, normal by my interpretation.    COORDINATION OF CARE: 12:43 PM- Discussed plan to order a CXR and breathing treatment. Advised pt of likely plan for discharge with antibiotics, inhaler, cough syrup and Prednisone. Pt advised of plan for treatment and pt agrees.  Labs Review Labs Reviewed - No data to display  Imaging Review Dg Chest 2 View  04/27/2014   CLINICAL DATA:  Head and chest cold for 1 week.  Cough and weakness.  EXAM: CHEST  2 VIEW  COMPARISON:  Single view of the chest 03/25/2014 and PA and lateral chest 12/10/2013.  FINDINGS: The lungs appear emphysematous but are clear. Heart size is normal. No pneumothorax or pleural effusion. No focal bony abnormality.  IMPRESSION: No acute disease.  The lungs appear emphysematous.   Electronically Signed   By: Inge Rise M.D.   On: 04/27/2014 14:10     EKG Interpretation None      MDM   Final diagnoses:  Bronchitis    Patient has known COPD/emphysema. Chest x-ray shows no pneumonia. She feels better after Albuterol /Atrovent breathing treatment.  Discharge medications Zithromax, Tussionex, prednisone, albuterol inhaler. Pulse ox is still slightly low, the patient feels she is able to go home.   I personally performed the services described in this documentation, which was scribed in my presence. The recorded information has been reviewed and is accurate.    Nat Christen, MD 04/28/14 [phone]

## 2014-04-27 NOTE — Discharge Instructions (Signed)
Bronchitis Bronchitis is swelling (inflammation) of the air tubes leading to your lungs (bronchi). This causes mucus and a cough. If the swelling gets bad, you may have trouble breathing. HOME CARE   Rest.  Drink enough fluids to keep your pee (urine) clear or pale yellow (unless you have a condition where you have to watch how much you drink).  Only take medicine as told by your doctor. If you were given antibiotic medicines, finish them even if you start to feel better.  Avoid smoke, irritating chemicals, and strong smells. These make the problem worse. Quit smoking if you smoke. This helps your lungs heal faster.  Use a cool mist humidifier. Change the water in the humidifier every day. You can also sit in the bathroom with hot shower running for 5-10 minutes. Keep the door closed.  See your health care provider as told.  Wash your hands often. GET HELP IF: Your problems do not get better after 1 week. GET HELP RIGHT AWAY IF:   Your fever gets worse.  You have chills.  Your chest hurts.  Your problems breathing get worse.  You have blood in your mucus.  You pass out (faint).  You feel light-headed.  You have a bad headache.  You throw up (vomit) again and again. MAKE SURE YOU:  Understand these instructions.  Will watch your condition.  Will get help right away if you are not doing well or get worse. Document Released: 03/27/2008 Document Revised: 10/14/2013 Document Reviewed: 06/03/2013 [facility] Patient Information 2015 [place], [place]. This information is not intended to replace advice given to you by your health care provider. Make sure you discuss any questions you have with your health care provider.   Chest x-ray showed no pneumonia. Stop smoking.  Prescription for antibiotic, cough syrup, prednisone, inhaler. Return if worse.

## 2014-05-22 ENCOUNTER — Telehealth: Payer: Self-pay | Admitting: *Deleted

## 2014-05-22 NOTE — Telephone Encounter (Signed)
Reviewed pt concerns with Dr. Wayland Salinas, instructed pt to use sitz baths and let the area heal. Pt to f/u with MD sept to check healing process.

## 2014-05-22 NOTE — Telephone Encounter (Signed)
Pt at clinic today with complaint of increased vaginal burning after using chemo cream, previously given Lotrisone and lidocaine cream to ease burning symptoms. Pt requesting if there is anything else she can use.Marland Kitchen

## 2014-06-26 ENCOUNTER — Ambulatory Visit: Payer: Medicare HMO | Attending: Gynecologic Oncology | Admitting: Gynecologic Oncology

## 2014-06-26 ENCOUNTER — Encounter: Payer: Self-pay | Admitting: Gynecologic Oncology

## 2014-06-26 VITALS — BP 142/64 | HR 71 | Temp 97.8°F | Resp 22 | Ht 67.0 in | Wt 164.3 lb

## 2014-06-26 DIAGNOSIS — M19049 Primary osteoarthritis, unspecified hand: Secondary | ICD-10-CM | POA: Diagnosis not present

## 2014-06-26 DIAGNOSIS — E039 Hypothyroidism, unspecified: Secondary | ICD-10-CM | POA: Insufficient documentation

## 2014-06-26 DIAGNOSIS — K219 Gastro-esophageal reflux disease without esophagitis: Secondary | ICD-10-CM | POA: Diagnosis not present

## 2014-06-26 DIAGNOSIS — F411 Generalized anxiety disorder: Secondary | ICD-10-CM | POA: Diagnosis not present

## 2014-06-26 DIAGNOSIS — F3289 Other specified depressive episodes: Secondary | ICD-10-CM | POA: Diagnosis not present

## 2014-06-26 DIAGNOSIS — I10 Essential (primary) hypertension: Secondary | ICD-10-CM | POA: Diagnosis not present

## 2014-06-26 DIAGNOSIS — D071 Carcinoma in situ of vulva: Secondary | ICD-10-CM | POA: Diagnosis present

## 2014-06-26 DIAGNOSIS — Z8544 Personal history of malignant neoplasm of other female genital organs: Secondary | ICD-10-CM | POA: Insufficient documentation

## 2014-06-26 DIAGNOSIS — M1710 Unilateral primary osteoarthritis, unspecified knee: Secondary | ICD-10-CM | POA: Insufficient documentation

## 2014-06-26 DIAGNOSIS — F329 Major depressive disorder, single episode, unspecified: Secondary | ICD-10-CM | POA: Insufficient documentation

## 2014-06-26 DIAGNOSIS — D072 Carcinoma in situ of vagina: Secondary | ICD-10-CM

## 2014-06-26 DIAGNOSIS — IMO0002 Reserved for concepts with insufficient information to code with codable children: Secondary | ICD-10-CM

## 2014-06-26 DIAGNOSIS — Z882 Allergy status to sulfonamides status: Secondary | ICD-10-CM | POA: Insufficient documentation

## 2014-06-26 NOTE — Progress Notes (Signed)
Follow-up Note: Gyn-Onc   Dorothy [name] 58 y.o. female  Chief Complaint  Patient presents with  . 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  Plan: I discussed that she has had a complete clinical response. I discussed that there might still be microscopic dysplasia present. Therefore I am recommending close followup with repeated cytology in 3 months. I discussed that sampling today may increase the risk for false positive results due to her recent inflammatory response at the vaginal cuff associated with recent Efudex therapy.  HPI: 58 year old white female seen in consultation request of Dr. Josefa Half regarding management of a newly diagnosed vaginal intraepithelial neoplasia grade 3. Patient has a relevant history dating back to when she was 52. 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 She claims she's had subsequent annual Pap smears were normal. Review of the medical record reveals a last Pap smear recorded was in December 2009 which was negative.  More recently, the patient had a Pap smear showing high-grade dysplasia. She subsequently underwent colposcopy with directed biopsies revealing severe dysplasia of the upper vagina. Patient denies any vaginal discharge or bleeding. 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  Between June and July 2015 she was treated with a 2 month course of Efudex therapy which she tolerated well. She presents today for posttherapy follow-up.  Review of Systems:10 point review of systems is negative except as noted in interval history.   Vitals: Blood pressure 142/64, pulse 71, temperature 97.8 F (36.6 C), temperature source Oral, resp. rate 22, height 5\' 7"  (1.702 m), weight 164 lb 4.8 oz  (74.526 kg), last menstrual period 10/23/1980.  Physical Exam: General : The patient is a healthy woman in no acute distress.  HEENT: normocephalic, extraoccular movements normal; neck is supple without thyromegally  Lynphnodes: Supraclavicular and inguinal nodes not enlarged  Abdomen: Soft, non-tender, no ascites, no organomegally, no masses, no hernias  Pelvic:  EGBUS: Normal female. No desquamation or lesions on vulva. Vagina: Normal, no lesions. Mild erythema at vaginal cuff consistent with prior therapy. 5% acetic acid applied and no acetowhite areas or abnormal vasculature. Urethra and Bladder: Normal, non-tender  Cervix: Surgically absent  Uterus: Surgically absent  Bi-manual examination: Non-tender; no adenxal masses or nodularity  Rectal: normal sphincter tone, no masses, no blood  Lower extremities: No edema or varicosities. Normal range of motion      Allergies  Allergen Reactions  . Sulfa Antibiotics Rash    Past Medical History  Diagnosis Date  . Constipation   . Hypertension     takes meds  . Hypothyroidism     on Levonthyroxine  . Pneumonia     x 2- last time > 5 years ago  . Depression     Takes Klonopin  . Arthritis     hands and Knees  . Avascular necrosis of bones of both hips 01/17/2012  . Hypothyroidism (acquired) 01/17/2012    Since age 39  . Benign essential HTN 01/17/2012  . Depression with anxiety 01/17/2012  . Ovarian cyst 01/17/2012    1995  BSO  . Pneumonia 01/17/2012    10 yrs ago and 5 years ago   . Rash and other nonspecific skin eruption 01/17/2012    Dorsum hands & extensor surfaces arms; intermittent  .  Anxiety   . GERD (gastroesophageal reflux disease)   . Personal history of colonic polyps 11/07/2007  . Cancer 1982    ovarian  . Cancer of cervix     Past Surgical History  Procedure Laterality Date  . Total hip arthroplasty  09/06/2011    Procedure: TOTAL HIP ARTHROPLASTY;  Surgeon: Ninetta Lights, MD;  Location: [place];   Service: Orthopedics;  Laterality: Left;  TOTAL HIP ARTHROPLASTY LEFT SIDE  . Ovarian cyst removal  1997    -BSO  . Total hip arthroplasty  10/11/2011    Procedure: TOTAL HIP ARTHROPLASTY;  Surgeon: Ninetta Lights, MD;  Location: [place];  Service: Orthopedics;  Laterality: Right;  120 MINUTES FOR THIS SURGERY  . Colonoscopy  2009    [place]-polyps, 5 yr FU  . Mesenteric artery bypass  09/25/2012    Procedure: MESENTERIC ARTERY BYPASS;  Surgeon: Elam Dutch, MD;  Location: [facility] OR;  Service: Vascular;  Laterality: N/A;  Bypass using Hemashield Gold Vascular Graft 49mm x 80mm x 40cm  . Celiac artery bypass  09/25/2012    Procedure: CELIAC ARTERY BYPASS;  Surgeon: Elam Dutch, MD;  Location[facility];  Service: Vascular;  Laterality: N/A;  Bypass using Hemashield Gold Vascular Graft 68mm x 49mm x 40cm  . Wound exploration  10/02/2012    Procedure: WOUND EXPLORATION;  Surgeon: Elam Dutch, MD;  Location: [facility] OR;  Service: Vascular;  Laterality: N/A;  abdominal wound exploration  . Abdominal hysterectomy  1982    ovaries retained--for CIS  . Joint replacement      Current Outpatient Prescriptions  Medication Sig Dispense Refill  . albuterol (PROVENTIL HFA;VENTOLIN HFA) 108 (90 BASE) MCG/ACT inhaler Inhale 1-2 puffs into the lungs every 6 (six) hours as needed for wheezing or shortness of breath.  1 Inhaler  0  . CALCIUM PO Take 1 tablet by mouth 2 (two) times daily.      . clonazePAM (KLONOPIN) 0.5 MG tablet Take 0.75 mg by mouth 2 (two) times daily as needed for anxiety.      Marland Kitchen estradiol (ESTRACE) 0.5 MG tablet Take 0.5 mg by mouth daily.      . hydrochlorothiazide (HYDRODIURIL) 25 MG tablet Take 12.5 mg by mouth daily.      Marland Kitchen ibuprofen (ADVIL,MOTRIN) 600 MG tablet Take 1 tablet (600 mg total) by mouth every 6 (six) hours as needed.  30 tablet  0  . levothyroxine (SYNTHROID, LEVOTHROID) 112 MCG tablet Take 112 mcg by mouth daily.      . Multiple Vitamins-Minerals (MULTIVITAMINS THER.  W/MINERALS) TABS tablet Take 1 tablet by mouth daily.      . Omega-3 Fatty Acids (FISH OIL PO) Take 1 capsule by mouth daily.      Marland Kitchen omeprazole (PRILOSEC) 40 MG capsule Take 40 mg by mouth daily.       Marland Kitchen venlafaxine (EFFEXOR-XR) 150 MG 24 hr capsule Take 300 mg by mouth daily with breakfast.       . chlorpheniramine-HYDROcodone (TUSSIONEX PENNKINETIC ER) 10-8 MG/5ML LQCR Take 5 mLs by mouth every 12 (twelve) hours as needed for cough.  120 mL  0  . clotrimazole-betamethasone (LOTRISONE) cream Apply to affected area 2 times daily  45 g  2  . conjugated estrogens (PREMARIN) vaginal cream Place 1 Applicatorful vaginally daily. After chemo      . fluorouracil (EFUDEX) 5 % cream Apply topically 2 (two) times daily. Use for 4 consecutive nights then stop. Repeat for 2 additional months      .  lidocaine (XYLOCAINE JELLY) 2 % jelly Apply to affected area daily prn  60 mL  2   No current facility-administered medications for this visit.    History   Social History  . Marital Status: Widowed    Spouse Name: N/A    Number of Children: 1  . Years of Education: N/A   Occupational History  . disabled    Social History Main Topics  . Smoking status: Current Every Day Smoker -- 1.00 packs/day for 40 years    Types: Cigarettes    Last Attempt to Quit: 07/26/2012  . Smokeless tobacco: Never Used     Comment: pt states that she wants to quit but needs help was advised to call 1-800-QUITNOW and to speak with PCP about avaliable aids  . Alcohol Use: No  . Drug Use: No  . Sexual Activity: Yes    Partners: Male    Birth Control/ Protection: Surgical     Comment: TVH/ovaries retained   Other Topics Concern  . Not on file   Social History Narrative  . No narrative on file    Family History  Problem Relation Age of Onset  . Anesthesia problems Neg Hx   . Hypotension Neg Hx   . Malignant hyperthermia Neg Hx   . Pseudochol deficiency Neg Hx   . Colon cancer Neg Hx   . Esophageal cancer Neg Hx    . Rectal cancer Neg Hx   . Stomach cancer Neg Hx   . Breast cancer Sister 72    dec age 45  . Cancer Sister   . Cancer Mother     lung  . Hypertension Son   . Heart failure Father   . Ovarian cancer Paternal Grandmother     dec age 53      Donaciano Eva, MD 06/26/2014, 10:36 AM

## 2014-08-24 ENCOUNTER — Encounter: Payer: Self-pay | Admitting: Gynecologic Oncology

## 2014-08-26 ENCOUNTER — Ambulatory Visit: Payer: Medicare HMO | Admitting: Gynecologic Oncology

## 2014-09-16 IMAGING — CR DG CHEST 2V
2 series · 2 of 2 positions shown · non-contrast
Comparison: Single view of the chest 03/25/2014 and PA and lateral
chest 12/10/2013.

CLINICAL DATA: Head and chest cold for 1 week.  Cough and weakness.

EXAM:
CHEST  2 VIEW

[view not recorded (1 of 2)]
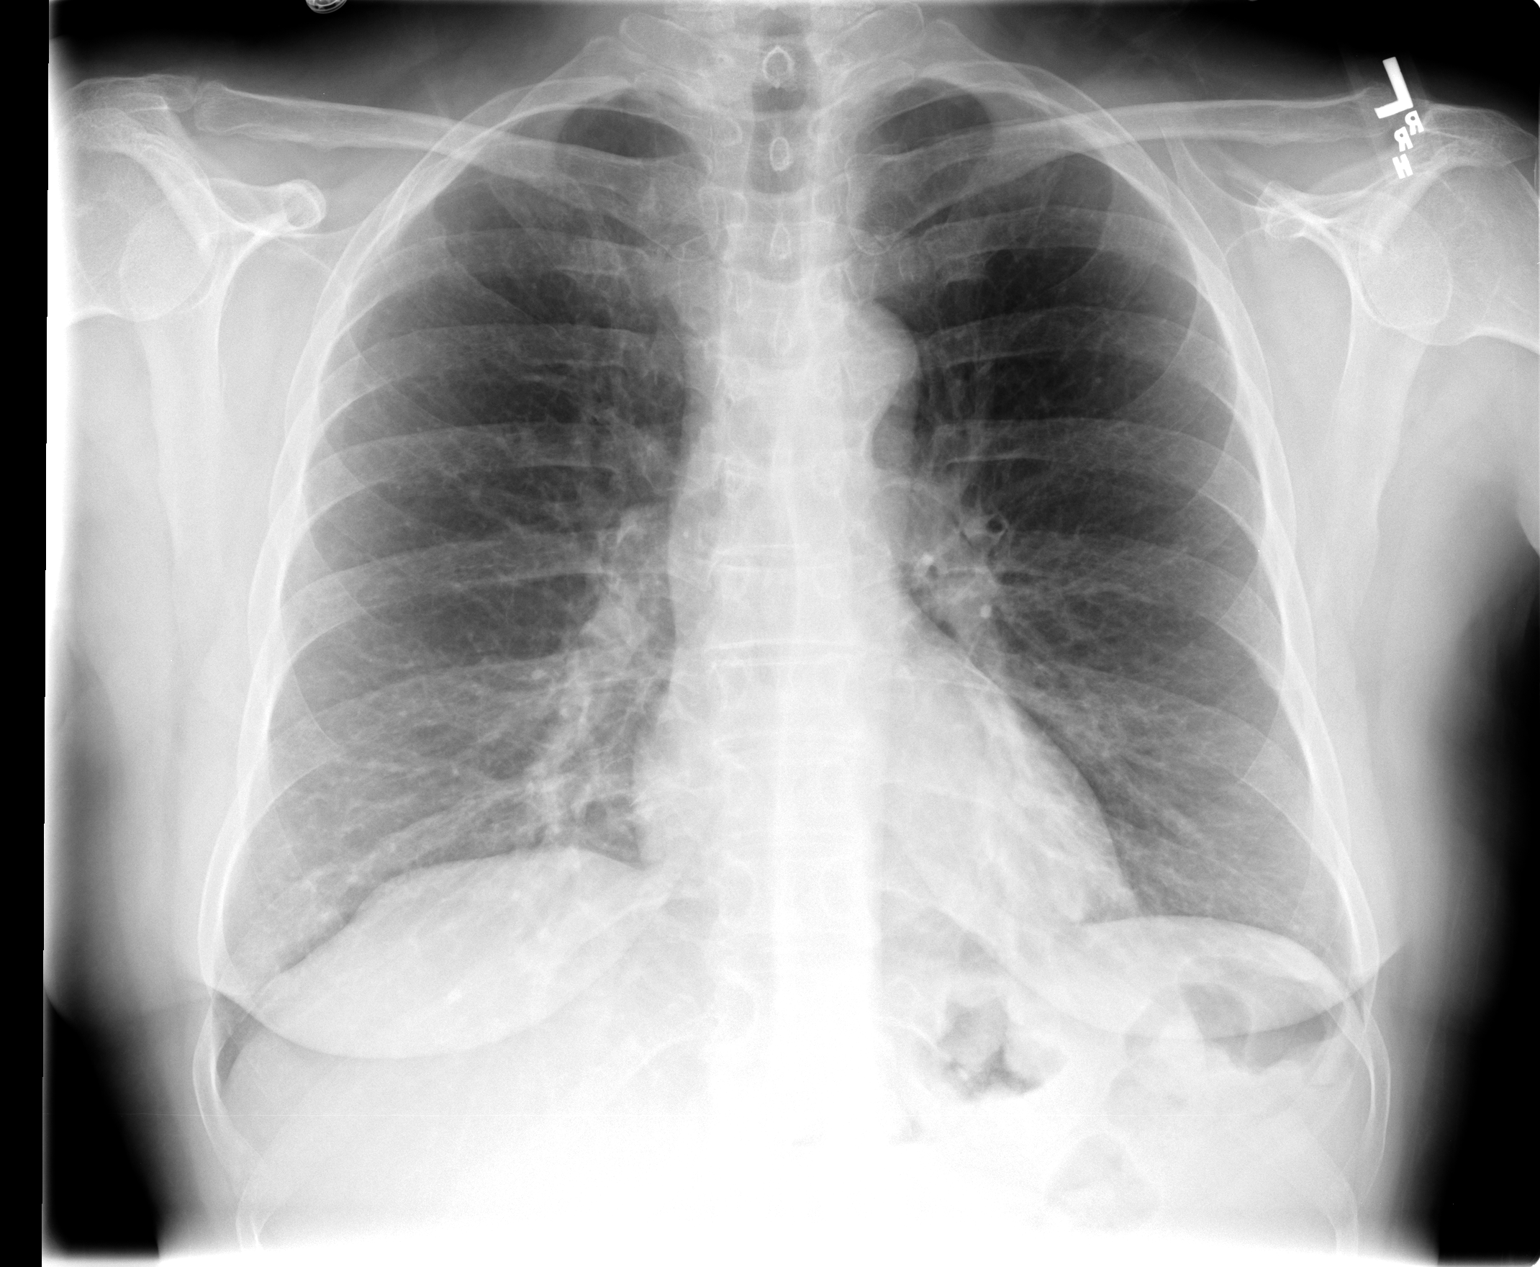

[view not recorded (2 of 2)]
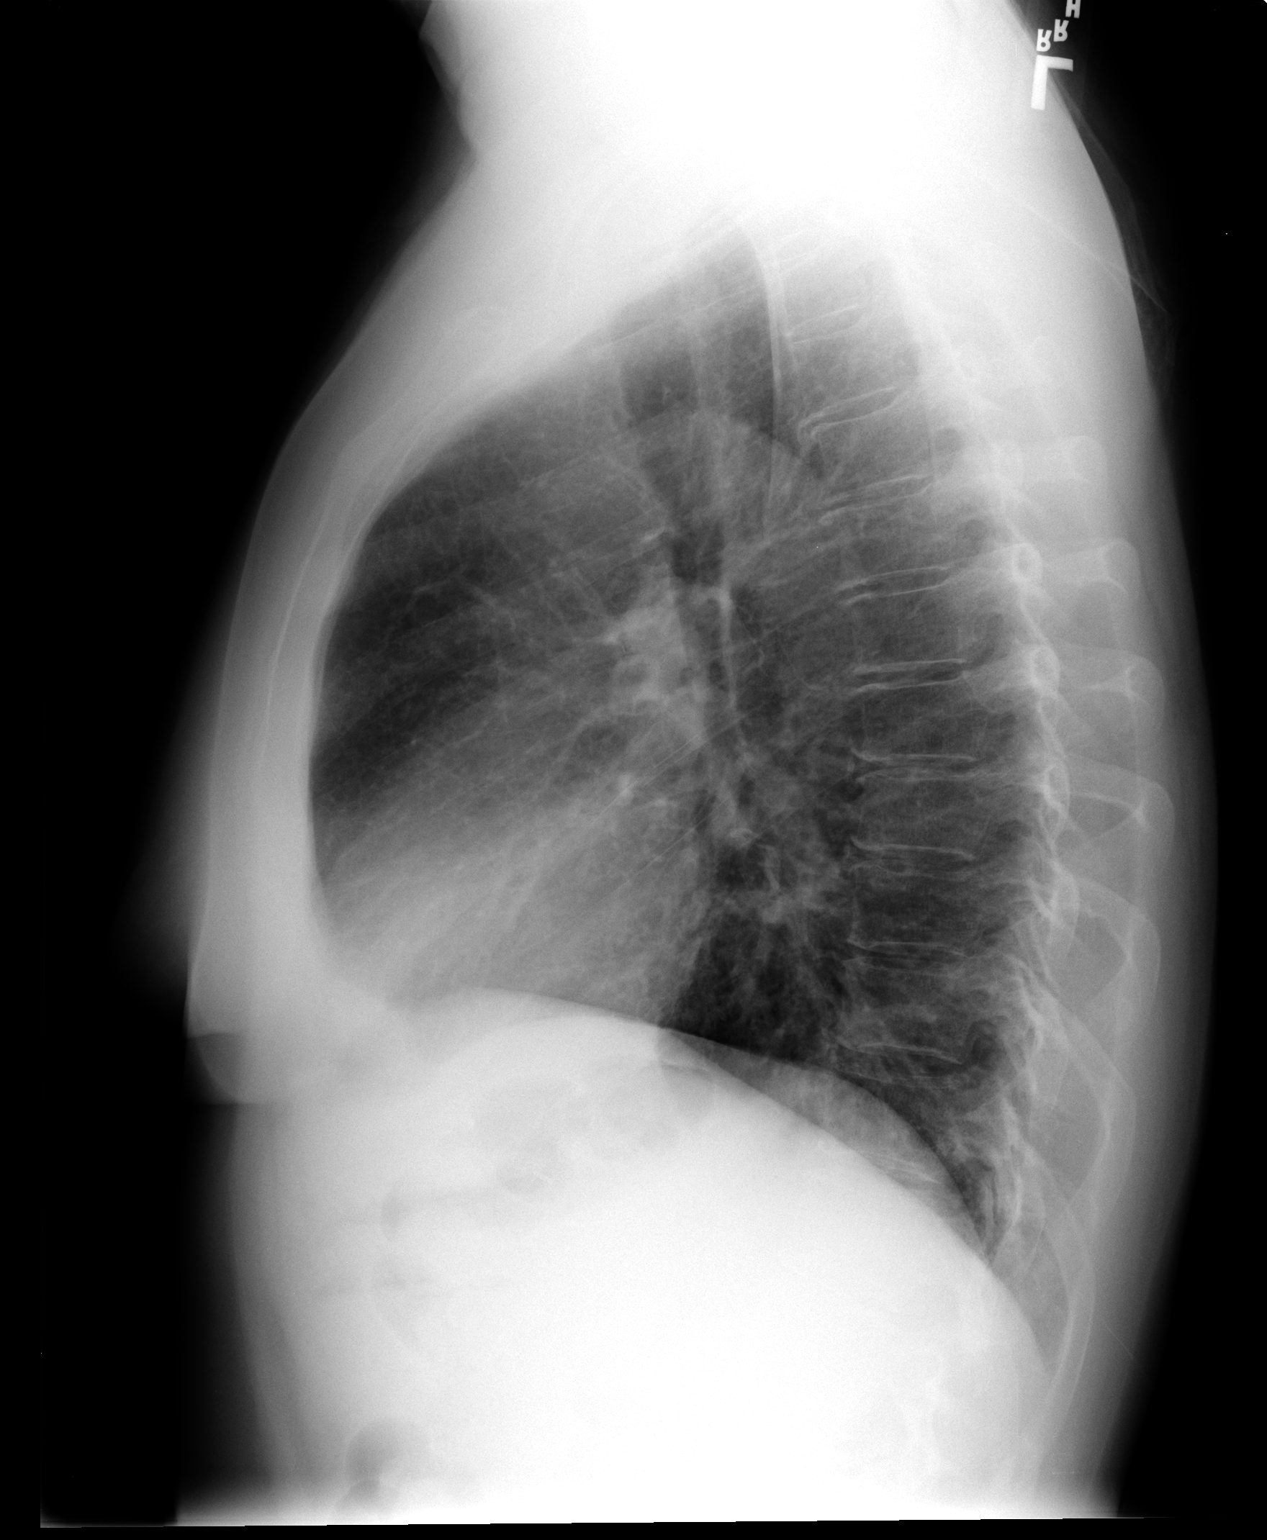

[2 of 2 positions shown; findings below may reference images not displayed]

FINDINGS: The lungs appear emphysematous but are clear. Heart size is normal.
No pneumothorax or pleural effusion. No focal bony abnormality.
IMPRESSION: No acute disease.  The lungs appear emphysematous.

## 2014-09-23 ENCOUNTER — Other Ambulatory Visit (HOSPITAL_COMMUNITY)
Admission: RE | Admit: 2014-09-23 | Discharge: 2014-09-23 | Disposition: A | Discharge status: Home / Self Care | Payer: Medicare HMO | Source: Ambulatory Visit | Attending: Gynecologic Oncology | Admitting: Gynecologic Oncology

## 2014-09-23 ENCOUNTER — Ambulatory Visit: Payer: Medicare HMO | Attending: Gynecologic Oncology | Admitting: Gynecologic Oncology

## 2014-09-23 ENCOUNTER — Encounter: Payer: Self-pay | Admitting: Gynecologic Oncology

## 2014-09-23 VITALS — BP 125/78 | HR 65 | Temp 98.2°F | Resp 20 | Ht 67.0 in | Wt 165.9 lb

## 2014-09-23 DIAGNOSIS — Z803 Family history of malignant neoplasm of breast: Secondary | ICD-10-CM | POA: Insufficient documentation

## 2014-09-23 DIAGNOSIS — F419 Anxiety disorder, unspecified: Secondary | ICD-10-CM | POA: Diagnosis not present

## 2014-09-23 DIAGNOSIS — Z113 Encounter for screening for infections with a predominantly sexual mode of transmission: Secondary | ICD-10-CM | POA: Insufficient documentation

## 2014-09-23 DIAGNOSIS — E039 Hypothyroidism, unspecified: Secondary | ICD-10-CM | POA: Diagnosis not present

## 2014-09-23 DIAGNOSIS — Z86010 Personal history of colonic polyps: Secondary | ICD-10-CM | POA: Diagnosis not present

## 2014-09-23 DIAGNOSIS — K219 Gastro-esophageal reflux disease without esophagitis: Secondary | ICD-10-CM | POA: Diagnosis not present

## 2014-09-23 DIAGNOSIS — Z8249 Family history of ischemic heart disease and other diseases of the circulatory system: Secondary | ICD-10-CM | POA: Insufficient documentation

## 2014-09-23 DIAGNOSIS — Z791 Long term (current) use of non-steroidal anti-inflammatories (NSAID): Secondary | ICD-10-CM | POA: Insufficient documentation

## 2014-09-23 DIAGNOSIS — Z124 Encounter for screening for malignant neoplasm of cervix: Secondary | ICD-10-CM | POA: Diagnosis present

## 2014-09-23 DIAGNOSIS — F329 Major depressive disorder, single episode, unspecified: Secondary | ICD-10-CM | POA: Diagnosis not present

## 2014-09-23 DIAGNOSIS — Z79899 Other long term (current) drug therapy: Secondary | ICD-10-CM | POA: Diagnosis not present

## 2014-09-23 DIAGNOSIS — I10 Essential (primary) hypertension: Secondary | ICD-10-CM | POA: Diagnosis not present

## 2014-09-23 DIAGNOSIS — Z800 Family history of malignant neoplasm of digestive organs: Secondary | ICD-10-CM | POA: Diagnosis not present

## 2014-09-23 DIAGNOSIS — D072 Carcinoma in situ of vagina: Secondary | ICD-10-CM

## 2014-09-23 DIAGNOSIS — R87810 Cervical high risk human papillomavirus (HPV) DNA test positive: Secondary | ICD-10-CM | POA: Insufficient documentation

## 2014-09-23 DIAGNOSIS — Z8041 Family history of malignant neoplasm of ovary: Secondary | ICD-10-CM | POA: Diagnosis not present

## 2014-09-23 DIAGNOSIS — Z8541 Personal history of malignant neoplasm of cervix uteri: Secondary | ICD-10-CM | POA: Diagnosis not present

## 2014-09-23 NOTE — Progress Notes (Signed)
Consult Note: Gyn-Onc   Dorothy [name] 58 y.o. female  Chief Complaint  Patient presents with  . VAIN III    Assessment : High-grade vaginal intraepithelial neoplasia.  She proceeded with Efudex treatment in June and July of this year. She was seen by Dr. Denman George in September and had a negative examination at that time. She comes in today for follow-up.  Plan:  We will follow-up in results of her Pap smear from today. We will notify her of the results and determine her disposition pending that result.  HPI: 58 year old white female seen in consultation request of Dr. Josefa Half regarding management of a newly diagnosed vaginal intraepithelial neoplasia grade 3. Patient has a relevant history dating back to when she was 77. 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 She claims she's had subsequent annual Pap smears were normal. Review of the medical record reveals a last Pap smear record  ed was in December 2009 which was negative.  More recently, the patient had a Pap smear showing high-grade dysplasia. She subsequently underwent colposcopy with directed biopsies revealing severe dysplasia of the upper vagina.   She was treated with Efudex cream 2 months in June and July 2015. She tolerated fairly well. She comes in today for repeat Pap smear. She has had quite a bit of stress. Her mother who is also patient of ours is on hospice from recurrent ovarian cancer. This is understandably been difficult for her and her sister [name]. She does seem to be holding up very well.   Review of Systems:  She denies any abdominal or pelvic pain. She denies any vaginal bleeding or discharge. There is no change in her bowel or bladder habits.   Allergies  Allergen Reactions  . Sulfa Antibiotics Rash    Past Medical History  Diagnosis Date  . Constipation   . Hypertension     takes meds  . Hypothyroidism     on Levonthyroxine  . Pneumonia     x  2- last time > 5 years ago  . Depression     Takes Klonopin  . Arthritis     hands and Knees  . Avascular necrosis of bones of both hips 01/17/2012  . Hypothyroidism (acquired) 01/17/2012    Since age 7  . Benign essential HTN 01/17/2012  . Depression with anxiety 01/17/2012  . Ovarian cyst 01/17/2012    1995  BSO  . Pneumonia 01/17/2012    10 yrs ago and 5 years ago   . Rash and other nonspecific skin eruption 01/17/2012    Dorsum hands & extensor surfaces arms; intermittent  . Anxiety   . GERD (gastroesophageal reflux disease)   . Personal history of colonic polyps 11/07/2007  . Cancer 1982    ovarian  . Cancer of cervix     Past Surgical History  Procedure Laterality Date  . Total hip arthroplasty  09/06/2011    Procedure: TOTAL HIP ARTHROPLASTY;  Surgeon: Ninetta Lights, MD;  Location: [place];  Service: Orthopedics;  Laterality: Left;  TOTAL HIP ARTHROPLASTY LEFT SIDE  . Ovarian cyst removal  1997    -BSO  . Total hip arthroplasty  10/11/2011    Procedure: TOTAL HIP ARTHROPLASTY;  Surgeon: Ninetta Lights, MD;  Location: [place];  Service: Orthopedics;  Laterality: Right;  120 MINUTES FOR THIS SURGERY  . Colonoscopy  2009    [place]-polyps, 5 yr FU  . 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 Celiac artery bypass  09/25/2012    Procedure: CELIAC ARTERY BYPASS;  Surgeon: Elam Dutch, MD;  Location: [facility] OR;  Service: Vascular;  Laterality: N/A;  Bypass using Hemashield Gold Vascular Graft 64mm x 36mm x 40cm  . Wound exploration  10/02/2012    Procedure: WOUND EXPLORATION;  Surgeon: Elam Dutch, MD;  Location: [facility] OR;  Service: Vascular;  Laterality: N/A;  abdominal wound exploration  . Abdominal hysterectomy  1982    ovaries retained--for CIS  . Joint replacement      Current  Outpatient Prescriptions  Medication Sig Dispense Refill  . albuterol (PROVENTIL HFA;VENTOLIN HFA) 108 (90 BASE) MCG/ACT inhaler Inhale 1-2 puffs into the lungs every 6 (six) hours as needed for wheezing or shortness of breath. 1 Inhaler 0  . CALCIUM PO Take 1 tablet by mouth 2 (two) times daily.    . clonazePAM (KLONOPIN) 0.5 MG tablet Take 0.75 mg by mouth 2 (two) times daily as needed for anxiety.    Marland Kitchen estradiol (ESTRACE) 0.5 MG tablet Take 0.5 mg by mouth daily.    . hydrochlorothiazide (HYDRODIURIL) 25 MG tablet Take 12.5 mg by mouth daily.    Marland Kitchen ibuprofen (ADVIL,MOTRIN) 600 MG tablet Take 1 tablet (600 mg total) by mouth every 6 (six) hours as needed. 30 tablet 0  . levothyroxine (SYNTHROID, LEVOTHROID) 112 MCG tablet Take 112 mcg by mouth daily.    Marland Kitchen omeprazole (PRILOSEC) 40 MG capsule Take 40 mg by mouth daily.     Marland Kitchen venlafaxine (EFFEXOR-XR) 150 MG 24 hr capsule Take 300 mg by mouth daily with breakfast.     . chlorpheniramine-HYDROcodone (TUSSIONEX PENNKINETIC ER) 10-8 MG/5ML LQCR Take 5 mLs by mouth every 12 (twelve) hours as needed for cough. (Patient not taking: Reported on 09/23/2014) 120 mL 0  . clotrimazole-betamethasone (LOTRISONE) cream Apply to affected area 2 times daily (Patient not taking: Reported on 09/23/2014) 45 g 2  . Multiple Vitamins-Minerals (MULTIVITAMINS THER. W/MINERALS) TABS tablet Take 1 tablet by mouth daily.    . Omega-3 Fatty Acids (FISH OIL PO) Take 1 capsule by mouth daily.     No current facility-administered medications for this visit.    History   Social History  . Marital Status: Widowed    Spouse Name: N/A    Number of Children: 1  . Years of Education: N/A   Occupational History  . disabled    Social History Main Topics  . Smoking status: Current Every Day Smoker -- 1.00 packs/day for 40 years    Types: Cigarettes    Last Attempt to Quit: 07/26/2012  . Smokeless tobacco: Never Used     Comment: pt states that she wants to quit but needs  help was advised to call 1-800-QUITNOW and to speak with PCP about avaliable aids  . Alcohol Use: No  . Drug Use: No  . Sexual Activity:    Partners: Male    Birth Control/ Protection: Surgical     Comment: TVH/ovaries retained   Other Topics Concern  . Not on file   Social History Narrative    Family History  Problem Relation Age of Onset  . Anesthesia problems Neg Hx   . Hypotension Neg Hx   . Malignant hyperthermia Neg Hx   . Pseudochol deficiency  Neg Hx   . Colon cancer Neg Hx   . Esophageal cancer Neg Hx   . Rectal cancer Neg Hx   . Stomach cancer Neg Hx   . Breast cancer Sister 54    dec age 55  . Cancer Sister   . Cancer Mother     lung  . Hypertension Son   . Heart failure Father   . Ovarian cancer Paternal Grandmother     dec age 38    Vitals: Blood pressure 125/78, pulse 65, temperature 98.2 F (36.8 C), temperature source Oral, resp. rate 20, height 5\' 7"  (1.702 m), weight 165 lb 14.4 oz (75.252 kg), last menstrual period 10/23/1980.  Physical Exam: General : The patient is a healthy woman in no acute distress.    pelvic: Normal female external genitalia. The vagina is well epithelialized. There are no [name] visible lesions. The vaginal cuff is without lesions or discharge. Pap smear was submitted without difficulty. Bimanual examination reveals no masses or nodularity.  Elvie Palomo A., MD 09/23/2014, 10:50 AM

## 2014-09-23 NOTE — Patient Instructions (Signed)
We will notify you of the results of your pap smear and will call you with the results.

## 2014-09-25 LAB — CYTOLOGY - PAP

## 2014-09-30 ENCOUNTER — Telehealth: Payer: Self-pay | Admitting: Gynecologic Oncology

## 2014-09-30 NOTE — Telephone Encounter (Signed)
Patient reporting having a recent cold and "having to fight it."  Informed of pap smear results and Dr. Elenora Gamma recommendations for colposcopy.  Appt made for Dec 16.  Advised to call for any questions or concerns.

## 2014-10-07 ENCOUNTER — Encounter: Payer: Self-pay | Admitting: Gynecologic Oncology

## 2014-10-07 ENCOUNTER — Ambulatory Visit: Payer: Medicare HMO | Attending: Gynecologic Oncology | Admitting: Gynecologic Oncology

## 2014-10-07 VITALS — BP 130/78 | HR 69 | Temp 98.2°F | Resp 18 | Ht 67.0 in | Wt 165.1 lb

## 2014-10-07 DIAGNOSIS — F17210 Nicotine dependence, cigarettes, uncomplicated: Secondary | ICD-10-CM | POA: Diagnosis not present

## 2014-10-07 DIAGNOSIS — D072 Carcinoma in situ of vagina: Secondary | ICD-10-CM | POA: Insufficient documentation

## 2014-10-07 DIAGNOSIS — R87810 Cervical high risk human papillomavirus (HPV) DNA test positive: Secondary | ICD-10-CM

## 2014-10-07 DIAGNOSIS — F418 Other specified anxiety disorders: Secondary | ICD-10-CM | POA: Insufficient documentation

## 2014-10-07 DIAGNOSIS — K219 Gastro-esophageal reflux disease without esophagitis: Secondary | ICD-10-CM | POA: Diagnosis not present

## 2014-10-07 DIAGNOSIS — E039 Hypothyroidism, unspecified: Secondary | ICD-10-CM | POA: Insufficient documentation

## 2014-10-07 DIAGNOSIS — I10 Essential (primary) hypertension: Secondary | ICD-10-CM | POA: Diagnosis not present

## 2014-10-07 DIAGNOSIS — Z90710 Acquired absence of both cervix and uterus: Secondary | ICD-10-CM | POA: Insufficient documentation

## 2014-10-07 DIAGNOSIS — Z8041 Family history of malignant neoplasm of ovary: Secondary | ICD-10-CM | POA: Diagnosis not present

## 2014-10-07 DIAGNOSIS — Z8541 Personal history of malignant neoplasm of cervix uteri: Secondary | ICD-10-CM | POA: Diagnosis not present

## 2014-10-07 DIAGNOSIS — Z79899 Other long term (current) drug therapy: Secondary | ICD-10-CM | POA: Insufficient documentation

## 2014-10-07 DIAGNOSIS — Z8049 Family history of malignant neoplasm of other genital organs: Secondary | ICD-10-CM

## 2014-10-07 MED ORDER — ESTROGENS, CONJUGATED 0.625 MG/GM VA CREA: 1.0000 | TOPICAL_CREAM | VAGINAL | Status: DC

## 2014-10-07 NOTE — Progress Notes (Signed)
Consult Note: Gyn-Onc   Dorothy [name] 58 y.o. female  Chief Complaint  Patient presents with  . VAIN III    Assessment : High-grade vaginal intraepithelial neoplasia.  She proceeded with Efudex treatment in June and July of this year. She was seen by Dr. Denman [name] in September and had a negative examination at that time. Pap smear in December showed atypical squamous cells of undetermined significance with positive high-risk HPV. Colposcopy today was negative. She will proceed with estrogen cream 1 applicator full daily at bedtime 2 times per week and return to see me in 4 months for repeat Pap smear.  Plan:  She will return to see me in 4 months. In the meantime she'll use Premarin vaginal cream per vagina twice weekly. Instructions regarding this were discussed with the patient and provided in her after visit summary. The prescription was sent to her pharmacy.  HPI: 58 year old white female seen in consultation request of Dr. Josefa [name] regarding management of a newly diagnosed vaginal intraepithelial neoplasia grade 3. Patient has a relevant history dating back to when she was 76. 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 She claims she's had subsequent annual Pap smears were normal. Review of the medical record reveals a last Pap smear record  ed was in December 2009 which was negative.  More recently, the patient had a Pap smear showing high-grade dysplasia. She subsequently underwent colposcopy with directed biopsies revealing severe dysplasia of the upper vagina.   She was treated with Efudex cream 2 months in June and July 2015. She tolerated fairly well. She comes in today for repeat Pap smear. She has had quite a bit of stress. Her mother who is also patient of ours is on hospice from recurrent ovarian cancer. This is understandably been difficult for her and her sister Dorothy [name]. She does seem to be holding up very well.  Interval  History: I last saw her December 2. At that time there were no [name] abnormal lesions. Pap smear revealed atypical squamous cells of undetermined significance with positive high-risk HPV. She comes in today for colposcopy.    Review of Systems:  She denies any abdominal or pelvic pain. She denies any vaginal bleeding or discharge. There is no change in her bowel or bladder habits.   Allergies  Allergen Reactions  . Sulfa Antibiotics Rash    Past Medical History  Diagnosis Date  . Constipation   . Hypertension     takes meds  . Hypothyroidism     on Levonthyroxine  . Pneumonia     x 2- last time > 5 years ago  . Depression     Takes Klonopin  . Arthritis     hands and Knees  . Avascular necrosis of bones of both hips 01/17/2012  . Hypothyroidism (acquired) 01/17/2012    Since age 49  . Benign essential HTN 01/17/2012  . Depression with anxiety 01/17/2012  . Ovarian cyst 01/17/2012    1995  BSO  . Pneumonia 01/17/2012    10 yrs ago and 5 years ago   . Rash and other nonspecific skin eruption 01/17/2012    Dorsum hands & extensor surfaces arms; intermittent  . Anxiety   . GERD (gastroesophageal reflux disease)   . Personal history of colonic polyps 11/07/2007  . Cancer 1982    ovarian  . Cancer of cervix     Past Surgical History  Procedure Laterality Date  . Total  hip arthroplasty  09/06/2011    Procedure: TOTAL HIP ARTHROPLASTY;  Surgeon: Ninetta Lights, MD;  Location: [place];  Service: Orthopedics;  Laterality: Left;  TOTAL HIP ARTHROPLASTY LEFT SIDE  . Ovarian cyst removal  1997    -BSO  . Total hip arthroplasty  10/11/2011    Procedure: TOTAL HIP ARTHROPLASTY;  Surgeon: Ninetta Lights, MD;  Location: [place];  Service: Orthopedics;  Laterality: Right;  120 MINUTES FOR THIS SURGERY  . Colonoscopy  2009    [place]-polyps, 5 yr FU  . Mesenteric artery bypass  09/25/2012    Procedure: MESENTERIC ARTERY BYPASS;  Surgeon: Elam Dutch, MD;  Location: [facility] OR;  Service:  Vascular;  Laterality: N/A;  Bypass using Hemashield Gold Vascular Graft 19mm x 58mm x 40cm  . Celiac artery bypass  09/25/2012    Procedure: CELIAC ARTERY BYPASS;  Surgeon: Elam Dutch, MD;  Location: [facility] OR;  Service: Vascular;  Laterality: N/A;  Bypass using Hemashield Gold Vascular Graft 32mm x 17mm x 40cm  . Wound exploration  10/02/2012    Procedure: WOUND EXPLORATION;  Surgeon: Elam Dutch, MD;  Location: [facility] OR;  Service: Vascular;  Laterality: N/A;  abdominal wound exploration  . Abdominal hysterectomy  1982    ovaries retained--for CIS  . Joint replacement      Current Outpatient Prescriptions  Medication Sig Dispense Refill  . albuterol (PROVENTIL HFA;VENTOLIN HFA) 108 (90 BASE) MCG/ACT inhaler Inhale 1-2 puffs into the lungs every 6 (six) hours as needed for wheezing or shortness of breath. 1 Inhaler 0  . chlorpheniramine-HYDROcodone (TUSSIONEX PENNKINETIC ER) 10-8 MG/5ML LQCR Take 5 mLs by mouth every 12 (twelve) hours as needed for cough. 120 mL 0  . clonazePAM (KLONOPIN) 0.5 MG tablet Take 0.75 mg by mouth 2 (two) times daily as needed for anxiety.    . clotrimazole-betamethasone (LOTRISONE) cream Apply to affected area 2 times daily 45 g 2  . estradiol (ESTRACE) 0.5 MG tablet Take 0.5 mg by mouth daily.    . hydrochlorothiazide (HYDRODIURIL) 25 MG tablet Take 12.5 mg by mouth daily.    Marland Kitchen ibuprofen (ADVIL,MOTRIN) 600 MG tablet Take 1 tablet (600 mg total) by mouth every 6 (six) hours as needed. 30 tablet 0  . levothyroxine (SYNTHROID, LEVOTHROID) 112 MCG tablet Take 112 mcg by mouth daily.    . Multiple Vitamins-Minerals (MULTIVITAMINS THER. W/MINERALS) TABS tablet Take 1 tablet by mouth daily.    . Omega-3 Fatty Acids (FISH OIL PO) Take 1 capsule by mouth daily.    Marland Kitchen omeprazole (PRILOSEC) 40 MG capsule Take 40 mg by mouth daily.     Marland Kitchen venlafaxine (EFFEXOR-XR) 150 MG 24 hr capsule Take 300 mg by mouth daily with breakfast.     . CALCIUM PO Take 1 tablet by mouth 2 (two)  times daily.     No current facility-administered medications for this visit.    History   Social History  . Marital Status: Widowed    Spouse Name: N/A    Number of Children: 1  . Years of Education: N/A   Occupational History  . disabled    Social History Main Topics  . Smoking status: Current Every Day Smoker -- 1.00 packs/day for 40 years    Types: Cigarettes    Last Attempt to Quit: 07/26/2012  . Smokeless tobacco: Never Used     Comment: pt states that she wants to quit but needs help was advised to call 1-800-QUITNOW and to speak with PCP  about avaliable aids  . Alcohol Use: No  . Drug Use: No  . Sexual Activity:    Partners: Male    Birth Control/ Protection: Surgical     Comment: TVH/ovaries retained   Other Topics Concern  . Not on file   Social History Narrative    Family History  Problem Relation Age of Onset  . Anesthesia problems Neg Hx   . Hypotension Neg Hx   . Malignant hyperthermia Neg Hx   . Pseudochol deficiency Neg Hx   . Colon cancer Neg Hx   . Esophageal cancer Neg Hx   . Rectal cancer Neg Hx   . Stomach cancer Neg Hx   . Breast cancer Sister 19    dec age 30  . Cancer Sister   . Cancer Mother     lung  . Hypertension Son   . Heart failure Father   . Ovarian cancer Paternal Grandmother     dec age 65    Vitals: Blood pressure 125/78, pulse 65, temperature 98.2 F (36.8 C), temperature source Oral, resp. rate 20, height 5\' 7"  (1.702 m), weight 165 lb 14.4 oz (75.252 kg), last menstrual period 10/23/1980.  Physical Exam: General : The patient is a healthy woman in no acute distress.    pelvic: Normal female external genitalia. There are no [name] visible lesions. The vaginal cuff is without lesions or discharge. Colposcopic evaluation was performed after the application acetic acid. There are no [name] visible lesions on colposcopy and there were no acetowhite epithelial changes. Colposcopy was somewhat difficult secondary to the  rugations in her upper vagina but colposcopy was adequate.  Blayne Garlick A., MD 10/07/2014, 10:27 AM

## 2014-10-07 NOTE — Patient Instructions (Signed)
Conjugated Estrogens vaginal cream  Use 1 gram in the vagina 2 evenings a week until your follow up appointment in 4 months. What is this medicine? CONJUGATED ESTROGENS (CON ju gate ed ESS troe jenz) are a mixture of female hormones. This cream can help relieve symptoms associated with menopause.like vaginal dryness and irritation. This medicine may be used for other purposes; ask your health care provider or pharmacist if you have questions. COMMON BRAND NAME(S): Premarin What should I tell my health care provider before I take this medicine? They need to know if you have any of these conditions: -abnormal vaginal bleeding -blood vessel disease or blood clots -breast, cervical, endometrial, or uterine cancer -dementia -diabetes -gallbladder disease -heart disease or recent heart attack -high blood pressure -high cholesterol -high level of calcium in the blood -hysterectomy -kidney disease -liver disease -migraine headaches -protein C deficiency -protein S deficiency -stroke -systemic lupus erythematosus (SLE) -tobacco smoker -an unusual or allergic reaction to estrogens other medicines, foods, dyes, or preservatives -pregnant or trying to get pregnant -breast-feeding How should I use this medicine? This medicine is for use in the vagina only. Do not take by mouth. Follow the directions on the prescription label. Use at bedtime unless otherwise directed by your doctor or health care professional. Use the special applicator supplied with the cream. Wash hands before and after use. Fill the applicator with the cream and remove from the tube. Lie on your back, part and bend your knees. Insert the applicator into the vagina and push the plunger to expel the cream into the vagina. Wash the applicator with warm soapy water and rinse well. Use exactly as directed for the complete length of time prescribed. Do not stop using except on the advice of your doctor or health care  professional. Talk to your pediatrician regarding the use of this medicine in children. Special care may be needed. A patient package insert for the product will be given with each prescription and refill. Read this sheet carefully each time. The sheet may change frequently. Overdosage: If you think you have taken too much of this medicine contact a poison control center or emergency room at once. NOTE: This medicine is only for you. Do not share this medicine with others. What if I miss a dose? If you miss a dose, use it as soon as you can. If it is almost time for your next dose, use only that dose. Do not use double or extra doses. What may interact with this medicine? Do not take this medicine with any of the following medications: -aromatase inhibitors like aminoglutethimide, anastrozole, exemestane, letrozole, testolactone This medicine may also interact with the following medications: -barbiturates used for inducing sleep or treating seizures -carbamazepine -grapefruit juice -medicines for fungal infections like itraconazole and ketoconazole -raloxifene or tamoxifen -rifabutin -rifampin -rifapentine -ritonavir -some antibiotics used to treat infections -St. John's Wort -warfarin This list may not describe all possible interactions. Give your health care provider a list of all the medicines, herbs, non-prescription drugs, or dietary supplements you use. Also tell them if you smoke, drink alcohol, or use illegal drugs. Some items may interact with your medicine. What should I watch for while using this medicine? Visit your health care professional for regular checks on your progress. You will need a regular breast and pelvic exam. You should also discuss the need for regular mammograms with your health care professional, and follow his or her guidelines. This medicine can make your body retain fluid, making your fingers,  hands, or ankles swell. Your blood pressure can go up. Contact  your doctor or health care professional if you feel you are retaining fluid. If you have any reason to think you are pregnant; stop taking this medicine at once and contact your doctor or health care professional. Tobacco smoking increases the risk of getting a blood clot or having a stroke, especially if you are more than 57 years old. You are strongly advised not to smoke. If you wear contact lenses and notice visual changes, or if the lenses begin to feel uncomfortable, consult your eye care specialist. If you are going to have elective surgery, you may need to stop taking this medicine beforehand. Consult your health care professional for advice prior to scheduling the surgery. What side effects may I notice from receiving this medicine? Side effects that you should report to your doctor or health care professional as soon as possible: -allergic reactions like skin rash, itching or hives, swelling of the face, lips, or tongue -breast tissue changes or discharge -changes in vision -chest pain -confusion, trouble speaking or understanding -dark urine -general ill feeling or flu-like symptoms -light-colored stools -nausea, vomiting -pain, swelling, warmth in the leg -right upper belly pain -severe headaches -shortness of breath -sudden numbness or weakness of the face, arm or leg -trouble walking, dizziness, loss of balance or coordination -unusual vaginal bleeding -yellowing of the eyes or skin Side effects that usually do not require medical attention (report to your doctor or health care professional if they continue or are bothersome): -hair loss -increased hunger or thirst -increased urination -symptoms of vaginal infection like itching, irritation or unusual discharge -unusually weak or tired This list may not describe all possible side effects. Call your doctor for medical advice about side effects. You may report side effects to FDA at 1-800-FDA-1088. Where should I keep my  medicine? Keep out of the reach of children. Store at room temperature between 15 and 30 degrees C (59 and 86 degrees F). Throw away any unused medicine after the expiration date. NOTE: This sheet is a summary. It may not cover all possible information. If you have questions about this medicine, talk to your doctor, pharmacist, or health care provider.  2015, [employer]. (2011-01-11 09:20:36)

## 2014-10-26 ENCOUNTER — Encounter: Payer: Self-pay | Admitting: Gynecologic Oncology

## 2014-10-26 ENCOUNTER — Telehealth: Payer: Self-pay | Admitting: Gynecologic Oncology

## 2014-10-26 NOTE — Telephone Encounter (Signed)
Patient called requesting a letter for her to be excused from jury duty because she cares for her mother who is dying on hospice.  Advised that a letter would be sent to her home per her request but that was not a guarantee that she would be excused from jury duty.  Advised to call for any questions or concerns.

## 2015-01-21 ENCOUNTER — Other Ambulatory Visit: Payer: Self-pay | Admitting: Nurse Practitioner

## 2015-01-21 DIAGNOSIS — Z1231 Encounter for screening mammogram for malignant neoplasm of breast: Secondary | ICD-10-CM

## 2015-02-01 ENCOUNTER — Ambulatory Visit
Admission: RE | Admit: 2015-02-01 | Discharge: 2015-02-01 | Disposition: A | Discharge status: Home / Self Care | Payer: Medicare HMO | Source: Ambulatory Visit | Attending: Nurse Practitioner | Admitting: Nurse Practitioner

## 2015-02-01 DIAGNOSIS — Z1231 Encounter for screening mammogram for malignant neoplasm of breast: Secondary | ICD-10-CM

## 2015-02-02 ENCOUNTER — Other Ambulatory Visit: Payer: Self-pay | Admitting: Nurse Practitioner

## 2015-02-02 DIAGNOSIS — R928 Other abnormal and inconclusive findings on diagnostic imaging of breast: Secondary | ICD-10-CM

## 2015-02-04 ENCOUNTER — Ambulatory Visit
Admission: RE | Admit: 2015-02-04 | Discharge: 2015-02-04 | Disposition: A | Discharge status: Home / Self Care | Payer: Medicare HMO | Source: Ambulatory Visit | Attending: Nurse Practitioner | Admitting: Nurse Practitioner

## 2015-02-04 DIAGNOSIS — R928 Other abnormal and inconclusive findings on diagnostic imaging of breast: Secondary | ICD-10-CM

## 2015-02-12 ENCOUNTER — Encounter: Payer: Self-pay | Admitting: Obstetrics and Gynecology

## 2015-02-12 ENCOUNTER — Ambulatory Visit (INDEPENDENT_AMBULATORY_CARE_PROVIDER_SITE_OTHER): Payer: Medicare HMO | Admitting: Obstetrics and Gynecology

## 2015-02-12 VITALS — BP 138/70 | HR 72 | Resp 16 | Ht 66.5 in | Wt 175.0 lb

## 2015-02-12 DIAGNOSIS — Z0000 Encounter for general adult medical examination without abnormal findings: Secondary | ICD-10-CM | POA: Diagnosis not present

## 2015-02-12 DIAGNOSIS — Z01419 Encounter for gynecological examination (general) (routine) without abnormal findings: Secondary | ICD-10-CM

## 2015-02-12 DIAGNOSIS — D072 Carcinoma in situ of vagina: Secondary | ICD-10-CM | POA: Diagnosis not present

## 2015-02-12 LAB — POCT URINALYSIS DIPSTICK
BILIRUBIN UA: NEGATIVE
GLUCOSE UA: NEGATIVE
Ketones, UA: NEGATIVE
Leukocytes, UA: NEGATIVE
NITRITE UA: NEGATIVE
Protein, UA: NEGATIVE
RBC UA: NEGATIVE
Urobilinogen, UA: NEGATIVE
pH, UA: 5

## 2015-02-12 MED ORDER — ESTRADIOL 0.5 MG PO TABS: 0.5000 mg | ORAL_TABLET | Freq: Every day | ORAL | Status: DC

## 2015-02-12 MED ORDER — DOXYCYCLINE HYCLATE 100 MG PO CAPS: ORAL_CAPSULE | ORAL | Status: DC

## 2015-02-12 NOTE — Patient Instructions (Signed)

## 2015-02-12 NOTE — Progress Notes (Signed)
59 y.o. G9P1011 Widowed Caucasian female here for annual exam.    Has seen GYN ONC for VAIN III.  has appointment next week. Did Effudex in June and July 2015.  Colpo and pap done in December 2015 - ASCUS-H and positive HR HPV.  On Estrace orally 0.5 mg two tablets daily.  Control menopausal symptoms. Wants to decrease dosage.  Step mother passed from ovarian cancer in May 2015.   PCP:   Angelina Ok   Patient's last menstrual period was 10/23/1980.          Sexually active: Yes.    The current method of family planning is status post hysterectomy.    Exercising: No.  The patient does not participate in regular exercise at present. 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 Total vaginal Hysterectomy at age 77. MMG: 02/01/15 BIRADS0:Incomplete. Diagnostic Left BIRADS1:Neg Colonoscopy:  10/2007 polyps Dr. Arelia Longest. Repeat 10 years  BMD:  2005  Result  Normal TDaP: will check with PCP Screening Labs: PCP Hb today: PCP, Urine today: Negative   reports that she has been smoking Cigarettes.  She has a 40 pack-year smoking history. She has never used smokeless tobacco. She reports that she does not drink alcohol or use illicit drugs.  Past Medical History  Diagnosis Date  . Constipation   . Hypertension     takes meds  . Hypothyroidism     on Levonthyroxine  . Pneumonia     x 2- last time > 5 years ago  . Depression     Takes Klonopin  . Arthritis     hands and Knees  . Avascular necrosis of bones of both hips 01/17/2012  . Hypothyroidism (acquired) 01/17/2012    Since age 28  . Benign essential HTN 01/17/2012  . Depression with anxiety 01/17/2012  . Ovarian cyst 01/17/2012    1995  BSO  . Pneumonia 01/17/2012    10 yrs ago and 5 years ago   . Rash and other nonspecific skin eruption 01/17/2012     Dorsum hands & extensor surfaces arms; intermittent  . Anxiety   . GERD (gastroesophageal reflux disease)   . Personal history of colonic polyps 11/07/2007  . Cancer 1982    ovarian  . Cancer of cervix     Past Surgical History  Procedure Laterality Date  . Total hip arthroplasty  09/06/2011    Procedure: TOTAL HIP ARTHROPLASTY;  Surgeon: Ninetta Lights, MD;  Location: [facility];  Service: Orthopedics;  Laterality: Left;  TOTAL HIP ARTHROPLASTY LEFT SIDE  . Ovarian cyst removal  1997    -BSO  . Total hip arthroplasty  10/11/2011    Procedure: TOTAL HIP ARTHROPLASTY;  Surgeon: Ninetta Lights, MD;  Location: [facility];  Service: Orthopedics;  Laterality: Right;  120 MINUTES FOR THIS SURGERY  . Colonoscopy  2009    [place]-polyps, 5 yr FU  . Mesenteric artery bypass  09/25/2012    Procedure: MESENTERIC ARTERY BYPASS;  Surgeon: Elam Dutch, MD;  Location: [facility];  Service: Vascular;  Laterality: N/A;  Bypass using Hemashield Gold Vascular Graft 79mm x 43mm x 40cm  . Celiac artery bypass  09/25/2012    Procedure: CELIAC ARTERY BYPASS;  Surgeon: Elam Dutch, MD;  Location: [place];  Service: Vascular;  Laterality: N/A;  Bypass using Hemashield Gold Vascular Graft 59mm x 51mm x 40cm  . Wound exploration  10/02/2012    Procedure: WOUND EXPLORATION;  Surgeon: Elam Dutch, MD;  Location: [facility] OR;  Service: Vascular;  Laterality: N/A;  abdominal wound exploration  . Abdominal hysterectomy  1982    ovaries retained--for CIS  . Joint replacement      Current Outpatient Prescriptions  Medication Sig Dispense Refill  . ADVAIR DISKUS 250-50 MCG/DOSE AEPB     . albuterol (PROVENTIL HFA;VENTOLIN HFA) 108 (90 BASE) MCG/ACT inhaler Inhale 1-2 puffs into the lungs every 6 (six) hours as needed for wheezing or shortness of breath. 1 Inhaler 0  . CALCIUM PO Take 1 tablet by mouth 2 (two) times daily.    . clonazePAM (KLONOPIN) 0.5 MG tablet Take 0.75 mg by mouth 2 (two) times daily as needed for  anxiety.    Marland Kitchen esomeprazole (NEXIUM) 40 MG capsule Take 1 capsule by mouth daily.    Marland Kitchen estradiol (ESTRACE) 0.5 MG tablet Take 0.5 mg by mouth daily.    . hydrochlorothiazide (HYDRODIURIL) 25 MG tablet Take 12.5 mg by mouth daily.    Marland Kitchen levothyroxine (SYNTHROID, LEVOTHROID) 112 MCG tablet Take 112 mcg by mouth daily.    . Multiple Vitamins-Minerals (MULTIVITAMINS THER. W/MINERALS) TABS tablet Take 1 tablet by mouth daily.    . Omega-3 Fatty Acids (FISH OIL PO) Take 1 capsule by mouth daily.    . risperiDONE (RISPERDAL) 0.5 MG tablet Take 1 tablet by mouth at bedtime.    Marland Kitchen venlafaxine (EFFEXOR-XR) 150 MG 24 hr capsule Take 300 mg by mouth daily with breakfast.      No current facility-administered medications for this visit.    Family History  Problem Relation Age of Onset  . Anesthesia problems Neg Hx   . Hypotension Neg Hx   . Malignant hyperthermia Neg Hx   . Pseudochol deficiency Neg Hx   . Colon cancer Neg Hx   . Esophageal cancer Neg Hx   . Rectal cancer Neg Hx   . Stomach cancer Neg Hx   . Breast cancer Sister 73    dec age 80  . Cancer Sister   . Cancer Mother     lung  . Hypertension Son   . Heart failure Father   . Ovarian cancer Paternal Grandmother     dec age 78    ROS:  Pertinent items are noted in HPI.  Otherwise, a comprehensive ROS was negative.  Exam:   BP 138/70 mmHg  Pulse 72  Resp 16  Ht 5' 6.5" (1.689 m)  Wt 175 lb (79.379 kg)  BMI 27.83 kg/m2  LMP 10/23/1980    General appearance: alert, cooperative and appears stated age Head: Normocephalic, without obvious abnormality, atraumatic Neck: no adenopathy, supple, symmetrical, trachea midline and thyroid normal to inspection and palpation Lungs: clear to auscultation bilaterally Breasts: normal appearance, no masses or tenderness, Inspection negative, No nipple retraction or dimpling, No nipple discharge or bleeding, No axillary or supraclavicular adenopathy, Normal to palpation without dominant  masses Heart: regular rate and rhythm Abdomen: vertical midline incision, soft, non-tender; bowel sounds normal; no masses,  no organomegaly Extremities: extremities normal, atraumatic, no cyanosis or edema Skin: Skin color, texture, turgor normal.  3 mm pustule of anterior abdominal skin. Lymph nodes: Cervical, supraclavicular, and axillary nodes normal. No abnormal inguinal nodes palpated Neurologic: Grossly normal  Pelvic: External genitalia:   Right mons erythema and fluctuant area 0.75 cm, slightly tender.  Urethra:  normal appearing urethra with no masses, tenderness or lesions              Bartholins and Skenes: normal                 Vagina: normal appearing vagina with normal color and discharge, no lesions              Cervix: absent              Pap taken: Yes.   Bimanual Exam:  Uterus:  uterus absent              Adnexa: no mass, fullness, tenderness              Rectovaginal: Yes.  .  Confirms.              Anus:  normal sphincter tone, no lesions  Chaperone was present for exam.  Assessment:   Well woman visit with normal exam. ERT patient.  Status post hysterectomy for CIS.  VAIN III.  Status post Effudex.  Status post BSO for ovarian cyst.  FH of ovarian cancer.   Plan: Yearly mammogram recommended after age 72.  Recommended self breast exam.  Pap and HR HPV as above. Follow up with Dr. Alycia Rossetti next week.  Discussed Calcium, Vitamin D, regular exercise program including cardiovascular and weight bearing exercise. Labs performed.  No.    Refills given on medications.  Yes.   See orders. Estrace 0.5 mg daily for one year.  Discussed risks of breast cancer, DVT, PE, MI, stroke.  Follow up annually and prn.   After visit summary provided.

## 2015-02-16 ENCOUNTER — Telehealth: Payer: Self-pay | Admitting: Emergency Medicine

## 2015-02-16 NOTE — Telephone Encounter (Signed)
Spoke with patient and message from Dr. Quincy Simmonds given. Patient agreeable and will keep appointment as scheduled. Routing to provider for final review. Patient agreeable to disposition. Will close encounter

## 2015-02-16 NOTE — Telephone Encounter (Signed)
-----   Message from Nunzio Cobbs, MD sent at 02/15/2015  9:31 PM EDT ----- Please contact patient with test results showing normal pap and negative HR HPV.  She is to keep her appointment with GYN ONC this week. Results to patient through My Chart but she also requested a phone call.  Cc- Marisa Sprinkles

## 2015-02-16 NOTE — Telephone Encounter (Signed)
Message left to return call to [facility] at [phone]. Advised message sent via mychart as well and to please call back.

## 2015-02-18 ENCOUNTER — Ambulatory Visit: Payer: Medicare HMO | Admitting: Gynecologic Oncology

## 2015-02-18 ENCOUNTER — Encounter: Payer: Self-pay | Admitting: Gynecologic Oncology

## 2015-02-18 VITALS — BP 131/76 | HR 70 | Temp 97.7°F | Resp 16 | Ht 66.5 in | Wt 175.0 lb

## 2015-02-19 NOTE — Progress Notes (Signed)
This encounter was created in error - please disregard.

## 2015-03-09 ENCOUNTER — Encounter: Payer: Self-pay | Admitting: Vascular Surgery

## 2015-03-11 ENCOUNTER — Ambulatory Visit: Payer: Commercial Managed Care - HMO | Admitting: Vascular Surgery

## 2015-03-11 ENCOUNTER — Inpatient Hospital Stay (HOSPITAL_COMMUNITY): Admission: RE | Admit: 2015-03-11 | Payer: Medicare HMO | Source: Ambulatory Visit

## 2015-04-21 ENCOUNTER — Encounter: Payer: Self-pay | Admitting: Vascular Surgery

## 2015-04-22 ENCOUNTER — Encounter: Payer: Self-pay | Admitting: Vascular Surgery

## 2015-04-22 ENCOUNTER — Ambulatory Visit (HOSPITAL_COMMUNITY)
Admission: RE | Admit: 2015-04-22 | Discharge: 2015-04-22 | Disposition: A | Discharge status: Home / Self Care | Payer: Medicare HMO | Source: Ambulatory Visit | Attending: Vascular Surgery | Admitting: Vascular Surgery

## 2015-04-22 ENCOUNTER — Ambulatory Visit (INDEPENDENT_AMBULATORY_CARE_PROVIDER_SITE_OTHER): Payer: Medicare HMO | Admitting: Vascular Surgery

## 2015-04-22 VITALS — BP 134/80 | HR 65 | Ht 66.5 in | Wt 168.9 lb

## 2015-04-22 DIAGNOSIS — K550 Acute vascular disorders of intestine: Secondary | ICD-10-CM

## 2015-04-22 DIAGNOSIS — K551 Chronic vascular disorders of intestine: Secondary | ICD-10-CM

## 2015-04-22 DIAGNOSIS — Z48812 Encounter for surgical aftercare following surgery on the circulatory system: Secondary | ICD-10-CM | POA: Diagnosis not present

## 2015-04-22 NOTE — Progress Notes (Signed)
Patient is a 59 year old female who presents for followup today. 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 She states she has regained some weight. She has no food fear. She has no postprandial abdominal pain. She has some occasional pain in her left leg with ambulation but does have a history of chronic back issues as well. She has had bilateral hip replacement. She states that occasionally her left knee gives away. She does not describe claudication symptoms. Unfortunately she continues to smoke. Greater than 3 minutes they were spent regarding smoking cessation counseling.  Review of systems: She denies chest pain. She denies shortness of breath.  Physical exam:    Filed Vitals:   04/22/15 0934  BP: 134/80  Pulse: 65  Height: 5' 6.5" (1.689 m)  Weight: 168 lb 14.4 oz (76.613 kg)  SpO2: 99%   Abdomen: Soft nontender nondistended well-healed midline laparotomy incision without hernia 2+ femoral pulse left 1+ femoral pulse right, no abdominal bruit Neck: No carotid bruits Chest: Clear to auscultation bilaterally Cardiac: Regular rate and rhythm without murmur Extremities: Absent dorsalis pedis but 2+ posterior tibial pulses bilaterally  Data: Patient had a mesenteric duplex exam today which showed her bypass graft is patent to the celiac and superior mesenteric artery slight increased velocity of SMA graft 240 cm/s no narrowing on gray scale. I reviewed and interpreted this study.  This is decreased from 285 cm/s 1 year ago.  Assessment: Patent mesenteric bypass. Plan: Continue to try to quit smoking. Follow up one year with mesenteric duplex.  Patient will follow-up with Dr. Noemi Chapel for her left knee pain if this continues to bother her.  Ruta Hinds, MD [facility] Office: [phone] Pager: [phone]

## 2015-04-22 NOTE — Addendum Note (Signed)
Addended by: Dorthula Rue L on: 04/22/2015 04:28 PM   Modules accepted: Orders

## 2015-04-22 NOTE — Patient Instructions (Signed)
Please review the tobacco cessation information given to you today. It lists many hints that are useful in your effort to stop smoking. The [facility] contact phone # is [phone] These nurses and advisors offer lots of FREE information and aids to help you quit.    The [place] [facility]uit Smoking line #  [phone], they will also assist you with programs designed to help you stop smoking.     Chronic Mesenteric Ischemia Mesenteric ischemia is a deficiency of blood in an area of the intestine supplied by an artery that supports the intestine. Chronic mesenteric ischemia, also called intestinal angina, is a long-term condition. It happens when an artery or vein that supports the intestine gradually becomes blocked or narrow, restricting the blood supply to the intestine. When the blood supply to the intestine is severely restricted, the intestines cannot function properly because needed oxygen cannot reach them.  CAUSES   Fatty deposits that build up in an artery or vein but have not yet restricted blood flow entirely.  Differences in some people's anatomy.  Rapid weight loss.  Weakened areas in blood vessel walls (aneurysms).  Swelling and inflammation of blood vessels (such as from fibromuscular dysplasia and arteritis).  Disorders of blood clotting.  Scarring and fibrosis of blood vessels after radiation therapy.  Blood vessel problems after drug use, such as use of cocaine. RISK FACTORS  Being female.  Being over age 50 with a history of coronary or vascular disease.  Smoking.  Congestive heart failure.  Diabetes.  High cholesterol.  High blood pressure (hypertension). SIGNS AND SYMPTOMS   Severe stomachache. Some people become fearful of eating because of pain.   Abdominal pain or cramps that develop about 30 minutes after a meal.   Abdominal pain after eating that becomes worse over time.   Diarrhea.   Nausea.   Vomiting.   Bloating.   Weight  loss. DIAGNOSIS  Chronic mesenteric ischemia is often diagnosed after the person's history is taken, a physical exam is done, and tests are taken. Tests may include:  Ultrasounds.  CT scans.  Angiography. This is an imaging test that uses a dye to obtain a picture of blood flow to the intestine.  Endoscopy. This involves putting a scope through the mouth, down the throat, and into the stomach and intestine to view the intestinal wall and take small tissue samples (biopsies).  Tonometry. In this test a tiny probe is passed through the mouth and into the stomach or intestine and left in place for 24 hours or more. It measures the output of carbon dioxide by the affected tissues. TREATMENT  Treatment may include:   Medicines to reduce blood clotting and increase blood flow.   Surgery to remove the blockage, repair arteries or veins, and restore blood flow. This may involve:   Angioplasty. This is surgery to widen the affected artery, reduce the blockage, and sometimes insert a small, mesh tube (stent).   Bypass surgery. This may be performed to bypass the blockage and reconnect healthy arteries or veins.   A stent in the affected area to help keep blocked arteries open. HOME CARE INSTRUCTIONS  Only take over-the-counter or prescription medicines as directed by your health care provider.   Keep all follow-up appointments as directed by your health care provider.   Prevent the condition from occurring by:  Doing regular exercise.  Keeping a healthy weight.  Keeping a healthy diet.  Managing cholesterol levels.  Keeping blood pressure and heart rhythm problems  under control.  Not smoking. SEEK IMMEDIATE MEDICAL CARE IF:  You have severe abdominal pain.   You notice blood in your stool.   You have nausea, vomiting, or diarrhea.   You have a fever. MAKE SURE YOU:  Understand these instructions.  Will watch your condition.  Will get help right away if you  are not doing well or get worse. Document Released: 05/29/2011 Document Revised: 06/11/2013 Document Reviewed: 04/09/2013 [facility] Patient Information 2015 [place], [place]. This information is not intended to replace advice given to you by your health care provider. Make sure you discuss any questions you have with your health care provider.

## 2016-01-19 ENCOUNTER — Other Ambulatory Visit: Payer: Self-pay

## 2016-01-19 DIAGNOSIS — Z1231 Encounter for screening mammogram for malignant neoplasm of breast: Secondary | ICD-10-CM

## 2016-02-04 ENCOUNTER — Ambulatory Visit: Payer: Medicare HMO

## 2016-02-17 ENCOUNTER — Encounter: Payer: Self-pay | Admitting: Obstetrics and Gynecology

## 2016-02-17 ENCOUNTER — Ambulatory Visit (INDEPENDENT_AMBULATORY_CARE_PROVIDER_SITE_OTHER): Payer: Medicare HMO | Admitting: Obstetrics and Gynecology

## 2016-02-17 VITALS — BP 110/72 | HR 70 | Resp 16 | Ht 66.0 in | Wt 173.0 lb

## 2016-02-17 DIAGNOSIS — Z01419 Encounter for gynecological examination (general) (routine) without abnormal findings: Secondary | ICD-10-CM

## 2016-02-17 DIAGNOSIS — A64 Unspecified sexually transmitted disease: Secondary | ICD-10-CM | POA: Diagnosis not present

## 2016-02-17 DIAGNOSIS — D072 Carcinoma in situ of vagina: Secondary | ICD-10-CM

## 2016-02-17 LAB — POCT URINALYSIS DIPSTICK
BILIRUBIN UA: NEGATIVE
Blood, UA: NEGATIVE
Glucose, UA: NEGATIVE
Ketones, UA: NEGATIVE
Leukocytes, UA: NEGATIVE
Nitrite, UA: NEGATIVE
Protein, UA: NEGATIVE
Urobilinogen, UA: NEGATIVE
pH, UA: 5

## 2016-02-17 LAB — HEPATITIS C ANTIBODY: HCV AB: NEGATIVE

## 2016-02-17 NOTE — Patient Instructions (Signed)
EXERCISE AND DIET:  We recommended that you start or continue a regular exercise program for good health. Regular exercise means any activity that makes your heart beat faster and makes you sweat.  We recommend exercising at least 30 minutes per day at least 3 days a week, preferably 4 or 5.  We also recommend a diet low in fat and sugar.  Inactivity, poor dietary choices and obesity can cause diabetes, heart attack, stroke, and kidney damage, among others.    ALCOHOL AND SMOKING:  Women should limit their alcohol intake to no more than 7 drinks/beers/glasses of wine (combined, not each!) per week. Moderation of alcohol intake to this level decreases your risk of breast cancer and liver damage. And of course, no recreational drugs are part of a healthy lifestyle.  And absolutely no smoking or even second hand smoke. Most people know smoking can cause heart and lung diseases, but did you know it also contributes to weakening of your bones? Aging of your skin?  Yellowing of your teeth and nails?  CALCIUM AND VITAMIN D:  Adequate intake of calcium and Vitamin D are recommended.  The recommendations for exact amounts of these supplements seem to change often, but generally speaking 600 mg of calcium (either carbonate or citrate) and 800 units of Vitamin D per day seems prudent. Certain women may benefit from higher intake of Vitamin D.  If you are among these women, your doctor will have told you during your visit.    PAP SMEARS:  Pap smears, to check for cervical cancer or precancers,  have traditionally been done yearly, although recent scientific advances have shown that most women can have pap smears less often.  However, every woman still should have a physical exam from her gynecologist every year. It will include a breast check, inspection of the vulva and vagina to check for abnormal growths or skin changes, a visual exam of the cervix, and then an exam to evaluate the size and shape of the uterus and  ovaries.  And after 59 years of age, a rectal exam is indicated to check for rectal cancers. We will also provide age appropriate advice regarding health maintenance, like when you should have certain vaccines, screening for sexually transmitted diseases, bone density testing, colonoscopy, mammograms, etc.   MAMMOGRAMS:  All women over 40 years old should have a yearly mammogram. Many facilities now offer a "3D" mammogram, which may cost around $50 extra out of pocket. If possible,  we recommend you accept the option to have the 3D mammogram performed.  It both reduces the number of women who will be called back for extra views which then turn out to be normal, and it is better than the routine mammogram at detecting truly abnormal areas.    COLONOSCOPY:  Colonoscopy to screen for colon cancer is recommended for all women at age 50.  We know, you hate the idea of the prep.  We agree, BUT, having colon cancer and not knowing it is worse!!  Colon cancer so often starts as a polyp that can be seen and removed at colonscopy, which can quite literally save your life!  And if your first colonoscopy is normal and you have no family history of colon cancer, most women don't have to have it again for 10 years.  Once every ten years, you can do something that may end up saving your life, right?  We will be happy to help you get it scheduled when you are ready.    Be sure to check your insurance coverage so you understand how much it will cost.  It may be covered as a preventative service at no cost, but you should check your particular policy.     Menopause and Herbal Products WHAT IS MENOPAUSE? Menopause is the normal time of life when menstrual periods decrease in frequency and eventually stop completely. This process can take several years for some women. Menopause is complete when you have had an absence of menstruation for a full year since your last menstrual period. It usually occurs between the ages of 48 and  55. It is not common for menopause to begin before the age of 40. During menopause, your body stops producing the female hormones estrogen and progesterone. Common symptoms associated with this loss of hormones (vasomotor symptoms) are:  Hot flashes.  Hot flushes.  Night sweats. Other common symptoms and complications of menopause include:  Decrease in sex drive.  Vaginal dryness and thinning of the walls of the vagina. This can make sex painful.  Dryness of the skin and development of wrinkles.  Headaches.  Tiredness.  Irritability.  Memory problems.  Weight gain.  Bladder infections.  Hair growth on the face and chest.  Inability to reproduce offspring (infertility).  Loss of density in the bones (osteoporosis) increasing your risk for breaks (fractures).  Depression.  Hardening and narrowing of the arteries (atherosclerosis). This increases your risk of heart attack and stroke. WHAT TREATMENT OPTIONS ARE AVAILABLE? There are many treatment choices for menopause symptoms. The most common treatment is hormone replacement therapy. Many alternative therapies for menopause are emerging, including the use of herbal products. These supplements can be found in the form of herbs, teas, oils, tinctures, and pills. Common herbal supplements for menopause are made from plants that contain phytoestrogens. Phytoestrogens are compounds that occur naturally in plants and plant products. They act like estrogen in the body. Foods and herbs that contain phytoestrogens include:  Soy.  Flax seeds.  Red clover.  Ginseng. WHAT MENOPAUSE SYMPTOMS MAY BE HELPED IF I USE HERBAL PRODUCTS?  Vasomotor symptoms. These may be helped by:  Soy. Some studies show that soy may have a moderate benefit for hot flashes.  Black cohosh. There is limited evidence indicating this may be beneficial for hot flashes.  Symptoms that are related to heart and blood vessel disease. These may be helped by  soy. Studies have shown that soy can help to lower cholesterol.  Depression. This may be helped by:  St. John's wort. There is limited evidence that shows this may help mild to moderate depression.  Black cohosh. There is evidence that this may help depression and mood swings.  Osteoporosis. Soy may help to decrease bone loss that is associated with menopause and may prevent osteoporosis. Limited evidence indicates that red clover may offer some bone loss protection as well. Other herbal products that are commonly used during menopause lack enough evidence to support their use as a replacement for conventional menopause therapies. These products include evening primrose, ginseng, and red clover. WHAT ARE THE CASES WHEN HERBAL PRODUCTS SHOULD NOT BE USED DURING MENOPAUSE? Do not use herbal products during menopause without your health care provider's approval if:  You are taking medicine.  You have a preexisting liver condition. ARE THERE ANY RISKS IN MY TAKING HERBAL PRODUCTS DURING MENOPAUSE? If you choose to use herbal products to help with symptoms of menopause, keep in mind that:  Different supplements have different and unmeasured amounts of herbal ingredients.    Herbal products are not regulated the same way that medicines are.  Concentrations of herbs may vary depending on the way they are prepared. For example, the concentration may be different in a pill, tea, oil, and tincture.  Little is known about the risks of using herbal products, particularly the risks of long-term use.  Some herbal supplements can be harmful when combined with certain medicines. Most commonly reported side effects of herbal products are mild. However, if used improperly, many herbal supplements can cause serious problems. Talk to your health care provider before starting any herbal product. If problems develop, stop taking the supplement and let your health care provider know.   This information is not  intended to replace advice given to you by your health care provider. Make sure you discuss any questions you have with your health care provider.   Document Released: 03/27/2008 Document Revised: 10/30/2014 Document Reviewed: 03/24/2014 [employer] Interactive Patient Education 2016 [employer]  

## 2016-02-17 NOTE — Progress Notes (Signed)
60 y.o. G35P1011 Widowed Caucasian female here for annual exam.    Off ERT.   Some hot flashes.  Sleep disturbance.  Trying Ambien now.   Hx VAIN III.  Status post Effudex in June and July 2015. Pap 02/12/15 - negative and negative HR HPV.   Recently started Zocor.  PCP:   Angelina Ok, NP  Patient's last menstrual period was 10/23/1980.           Sexually active: Yes.   female The current method of family planning is status post hysterectomy.    Exercising: No.   Smoker:  Yes, smokes 1/2 ppd  Health Maintenance: Pap:  02-05-14 HGSIL History of abnormal Pap:  Yes, 02/05/14 showed HGSIL including CIN 2 or CIN 3 or CIS.  Colposcopy of the vagina 03/05/14 showed multifocal areas of the cuff with acetowhite change. 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 MMG:  02-01-15 normal :[facility](see Epic)--has appointment 02-18-16 Colonoscopy:  2009 benign polyp with Dr. Verdell Face due 10/2017. BMD:  PCP scheduling Result  Pt. To have upcoming appt. With PCP in [place], [place] TDaP:  PCP Gardasil:   N/A HIV: today Hep C: today Screening Labs:  Hb today: PCP, Urine today: Neg   reports that she has been smoking Cigarettes.  She has a 40 pack-year smoking history. She has never used smokeless tobacco. She reports that she does not drink alcohol or use illicit drugs.  Past Medical History  Diagnosis Date  . Constipation   . Hypertension     takes meds  . Hypothyroidism     on Levonthyroxine  . Pneumonia     x 2- last time > 5 years ago  . Depression     Takes Klonopin  . Arthritis     hands and Knees  . Avascular necrosis of bones of both hips ([place]) 01/17/2012  . Hypothyroidism (acquired) 01/17/2012    Since age 17  . Benign essential HTN 01/17/2012  . Depression with anxiety 01/17/2012  . Ovarian cyst 01/17/2012    1995  BSO  . Pneumonia 01/17/2012    10 yrs ago and 5 years ago   . Rash and other nonspecific skin eruption 01/17/2012    Dorsum hands & extensor surfaces arms;  intermittent  . Anxiety   . GERD (gastroesophageal reflux disease)   . Personal history of colonic polyps 11/07/2007  . Cancer ([place]) 1982    ovarian  . Cancer of cervix ([place])   . Hypercholesteremia     Past Surgical History  Procedure Laterality Date  . Total hip arthroplasty  09/06/2011    Procedure: TOTAL HIP ARTHROPLASTY;  Surgeon: Ninetta Lights, MD;  Location: [facility];  Service: Orthopedics;  Laterality: Left;  TOTAL HIP ARTHROPLASTY LEFT SIDE  . 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 Total hip arthroplasty  10/11/2011    Procedure: TOTAL HIP ARTHROPLASTY;  Surgeon: Ninetta Lights, MD;  Location: [facility];  Service: Orthopedics;  Laterality: Right;  120 MINUTES FOR THIS SURGERY  . Colonoscopy  2009    [place]-polyps, 5 yr FU  . Mesenteric artery bypass  09/25/2012    Procedure: MESENTERIC ARTERY BYPASS;  Surgeon: Elam Dutch, MD;  Location: [facility] OR;  Service: Vascular;  Laterality: N/A;  Bypass using Hemashield Gold Vascular Graft 81mm x 17mm x 40cm  . Celiac artery bypass  09/25/2012    Procedure: CELIAC ARTERY BYPASS;  Surgeon: Elam Dutch,  MD;  Location: [facility] OR;  Service: Vascular;  Laterality: N/A;  Bypass using Hemashield Gold Vascular Graft 43mm x 73mm x 40cm  . Wound exploration  10/02/2012    Procedure: WOUND EXPLORATION;  Surgeon: Elam Dutch, MD;  Location: [facility] OR;  Service: Vascular;  Laterality: N/A;  abdominal wound exploration  . Abdominal hysterectomy  1982    ovaries retained--for CIS  . Joint replacement      Current Outpatient Prescriptions  Medication Sig Dispense Refill  . ADVAIR DISKUS 250-50 MCG/DOSE AEPB     . albuterol (PROVENTIL HFA;VENTOLIN HFA) 108 (90 BASE) MCG/ACT inhaler Inhale 1-2 puffs into the lungs every 6 (six) hours as needed for wheezing or shortness of breath. 1 Inhaler 0  . ARIPiprazole (ABILIFY) 5 MG tablet Take 5 mg by mouth at bedtime.    Marland Kitchen CALCIUM PO Take 1 tablet by mouth 2 (two)  times daily.    . clonazePAM (KLONOPIN) 0.5 MG tablet Take 0.75 mg by mouth 2 (two) times daily as needed for anxiety.    . hydrochlorothiazide (HYDRODIURIL) 25 MG tablet Take 12.5 mg by mouth daily.    Marland Kitchen levothyroxine (SYNTHROID, LEVOTHROID) 112 MCG tablet Take 112 mcg by mouth daily.    . Multiple Vitamins-Minerals (MULTIVITAMINS THER. W/MINERALS) TABS tablet Take 1 tablet by mouth daily.    . simvastatin (ZOCOR) 20 MG tablet Take 1 tablet by mouth daily.    Marland Kitchen venlafaxine (EFFEXOR-XR) 150 MG 24 hr capsule Take 300 mg by mouth daily with breakfast.     . zolpidem (AMBIEN) 5 MG tablet Take 1 tablet by mouth at bedtime.     No current facility-administered medications for this visit.    Family History  Problem Relation Age of Onset  . Anesthesia problems Neg Hx   . Hypotension Neg Hx   . Malignant hyperthermia Neg Hx   . Pseudochol deficiency Neg Hx   . Colon cancer Neg Hx   . Esophageal cancer Neg Hx   . Rectal cancer Neg Hx   . Stomach cancer Neg Hx   . Breast cancer Sister 29    dec age 26  . Cancer Sister   . Cancer Mother     lung  . Hypertension Son   . Heart failure Father   . Ovarian cancer Paternal Grandmother     dec age 41    ROS:  Pertinent items are noted in HPI.  Otherwise, a comprehensive ROS was negative.  Exam:   BP 110/72 mmHg  Pulse 70  Resp 16  Ht 5\' 6"  (1.676 m)  Wt 173 lb (78.472 kg)  BMI 27.94 kg/m2  LMP 10/23/1980    General appearance: alert, cooperative and appears stated age Head: Normocephalic, without obvious abnormality, atraumatic Neck: no adenopathy, supple, symmetrical, trachea midline and thyroid normal to inspection and palpation Lungs: clear to auscultation bilaterally Breasts: normal appearance, no masses or tenderness, Inspection negative, No nipple retraction or dimpling, No nipple discharge or bleeding, No axillary or supraclavicular adenopathy Heart: regular rate and rhythm Abdomen: incisions:  Yes.    Vertical midline incision  upper and lower abdomen , soft, non-tender; no masses, no organomegaly Extremities: extremities normal, atraumatic, no cyanosis or edema Skin: Skin color, texture, turgor normal. No rashes or lesions Lymph nodes: Cervical, supraclavicular, and axillary nodes normal. No abnormal inguinal nodes palpated Neurologic: Grossly normal  Pelvic: External genitalia:  no lesions              Urethra:  normal appearing urethra  with no masses, tenderness or lesions              Bartholins and Skenes: normal                 Vagina: normal appearing vagina with normal color and discharge, no lesions              Cervix: absent              Pap taken: Yes.  To GPA. Bimanual Exam:  Uterus:  uterus absent              Adnexa: no mass, fullness, tenderness              Rectal exam: Yes.  .  Confirms.              Anus:  normal sphincter tone, no lesions  Chaperone was present for exam.  Assessment:   Well woman visit with normal exam. Former ERT patient.  Status post total vaginal hysterectomy for CIS.  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 Status post Effudex.  FH of ovarian cancer in paternal grandmother and breast cancer in sister. Menopausal symptoms.   Plan: Yearly mammogram recommended after age 58.  Recommended self breast exam.  Pap and HR HPV as above. To GPA. Discussed Calcium, Vitamin D, regular exercise program including cardiovascular and weight bearing exercise.  Encouraged regular exercise plan.  Labs performed.  Yes.  .   See orders.  HIV and hep C. Prescription medication(s) given.   No.  Dicussed herbal remedies for menopausal symptoms.  Follow up annually and prn.       After visit summary provided.

## 2016-02-18 ENCOUNTER — Ambulatory Visit
Admission: RE | Admit: 2016-02-18 | Discharge: 2016-02-18 | Disposition: A | Discharge status: Home / Self Care | Payer: Medicare HMO | Source: Ambulatory Visit

## 2016-02-18 DIAGNOSIS — Z1231 Encounter for screening mammogram for malignant neoplasm of breast: Secondary | ICD-10-CM

## 2016-02-18 LAB — HIV ANTIBODY (ROUTINE TESTING W REFLEX): HIV: NONREACTIVE

## 2016-04-27 ENCOUNTER — Ambulatory Visit: Payer: Medicare HMO | Admitting: Vascular Surgery

## 2016-04-27 ENCOUNTER — Encounter (HOSPITAL_COMMUNITY): Payer: Medicare HMO

## 2016-04-28 ENCOUNTER — Encounter: Payer: Self-pay | Admitting: Vascular Surgery

## 2016-05-04 ENCOUNTER — Ambulatory Visit (HOSPITAL_COMMUNITY)
Admission: RE | Admit: 2016-05-04 | Discharge: 2016-05-04 | Disposition: A | Discharge status: Home / Self Care | Payer: Medicare HMO | Source: Ambulatory Visit | Attending: Vascular Surgery | Admitting: Vascular Surgery

## 2016-05-04 ENCOUNTER — Ambulatory Visit (INDEPENDENT_AMBULATORY_CARE_PROVIDER_SITE_OTHER): Payer: Medicare HMO | Admitting: Vascular Surgery

## 2016-05-04 ENCOUNTER — Encounter: Payer: Self-pay | Admitting: Vascular Surgery

## 2016-05-04 VITALS — BP 123/80 | HR 62 | Temp 97.2°F | Resp 18 | Ht 68.0 in | Wt 163.0 lb

## 2016-05-04 DIAGNOSIS — F17210 Nicotine dependence, cigarettes, uncomplicated: Secondary | ICD-10-CM

## 2016-05-04 DIAGNOSIS — K551 Chronic vascular disorders of intestine: Secondary | ICD-10-CM | POA: Insufficient documentation

## 2016-05-04 DIAGNOSIS — Z48812 Encounter for surgical aftercare following surgery on the circulatory system: Secondary | ICD-10-CM | POA: Diagnosis not present

## 2016-05-04 DIAGNOSIS — Z95828 Presence of other vascular implants and grafts: Secondary | ICD-10-CM | POA: Diagnosis not present

## 2016-05-04 NOTE — Progress Notes (Signed)
Patient is a 60 year old female who presents for followup today. 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 She states she has regained some weight. She has no food fear. She has no postprandial abdominal pain. She has some occasional pain in her left leg with ambulation but does have a history of chronic back issues as well. She has had bilateral hip replacement.  She does not describe claudication symptoms. She does try to walk one hour every day. Unfortunately she continues to smoke. Greater than 3 minutes they were spent regarding smoking cessation counseling.  Review of systems: She denies chest pain. She denies shortness of breath.  Physical exam: Filed Vitals:   05/04/16 0924  BP: 123/80  Pulse: 62  Temp: 97.2 F (36.2 C)  TempSrc: Oral  Resp: 18  Height: 5\' 8"  (1.727 m)  Weight: 163 lb (73.936 kg)  SpO2: 99%      Abdomen: Soft nontender nondistended well-healed midline laparotomy incision without hernia 2+ femoral pulse left 1+ femoral pulse right, no abdominal bruit Neck: No carotid bruits Chest: Clear to auscultation bilaterally Cardiac: Regular rate and rhythm without murmur Extremities: Absent dorsalis pedis but 2+ posterior tibial pulses bilaterally  Data: Patient had a mesenteric duplex exam today which showed her bypass graft is patent to the celiac and superior mesenteric artery slight increased velocity of SMA graft 247 cm/s no narrowing on gray scale. I reviewed and interpreted this study.  This is decreased from 285 cm/s 2 years ago.  Assessment: Patent mesenteric bypass. Plan: Continue to try to quit smoking. Follow up one year with mesenteric duplex.    Ruta Hinds, MD [facility] Office: [phone] Pager: [phone]

## 2016-08-18 ENCOUNTER — Inpatient Hospital Stay (HOSPITAL_COMMUNITY)
Admission: EM | Admit: 2016-08-18 | Discharge: 2016-08-21 | DRG: 190 | Disposition: A | Discharge status: Home / Self Care | Payer: Medicare HMO | Attending: Internal Medicine | Admitting: Internal Medicine

## 2016-08-18 ENCOUNTER — Other Ambulatory Visit (HOSPITAL_COMMUNITY): Payer: Self-pay | Admitting: Nurse Practitioner

## 2016-08-18 ENCOUNTER — Ambulatory Visit (HOSPITAL_COMMUNITY)
Admission: RE | Admit: 2016-08-18 | Discharge: 2016-08-18 | Disposition: A | Discharge status: Home / Self Care | Payer: Medicare HMO | Source: Ambulatory Visit | Attending: Nurse Practitioner | Admitting: Nurse Practitioner

## 2016-08-18 ENCOUNTER — Emergency Department (HOSPITAL_COMMUNITY): Payer: Medicare HMO

## 2016-08-18 ENCOUNTER — Other Ambulatory Visit: Payer: Self-pay

## 2016-08-18 ENCOUNTER — Encounter (HOSPITAL_COMMUNITY): Payer: Self-pay | Admitting: Emergency Medicine

## 2016-08-18 DIAGNOSIS — Z87440 Personal history of urinary (tract) infections: Secondary | ICD-10-CM

## 2016-08-18 DIAGNOSIS — J449 Chronic obstructive pulmonary disease, unspecified: Secondary | ICD-10-CM

## 2016-08-18 DIAGNOSIS — E7800 Pure hypercholesterolemia, unspecified: Secondary | ICD-10-CM | POA: Diagnosis present

## 2016-08-18 DIAGNOSIS — Z8041 Family history of malignant neoplasm of ovary: Secondary | ICD-10-CM

## 2016-08-18 DIAGNOSIS — J440 Chronic obstructive pulmonary disease with acute lower respiratory infection: Secondary | ICD-10-CM | POA: Diagnosis present

## 2016-08-18 DIAGNOSIS — J9601 Acute respiratory failure with hypoxia: Secondary | ICD-10-CM | POA: Diagnosis present

## 2016-08-18 DIAGNOSIS — Z90710 Acquired absence of both cervix and uterus: Secondary | ICD-10-CM

## 2016-08-18 DIAGNOSIS — Z882 Allergy status to sulfonamides status: Secondary | ICD-10-CM

## 2016-08-18 DIAGNOSIS — Z96643 Presence of artificial hip joint, bilateral: Secondary | ICD-10-CM | POA: Diagnosis present

## 2016-08-18 DIAGNOSIS — R911 Solitary pulmonary nodule: Secondary | ICD-10-CM | POA: Diagnosis present

## 2016-08-18 DIAGNOSIS — E039 Hypothyroidism, unspecified: Secondary | ICD-10-CM | POA: Diagnosis present

## 2016-08-18 DIAGNOSIS — Z86010 Personal history of colonic polyps: Secondary | ICD-10-CM | POA: Diagnosis not present

## 2016-08-18 DIAGNOSIS — R0902 Hypoxemia: Secondary | ICD-10-CM

## 2016-08-18 DIAGNOSIS — Z803 Family history of malignant neoplasm of breast: Secondary | ICD-10-CM | POA: Diagnosis not present

## 2016-08-18 DIAGNOSIS — R0609 Other forms of dyspnea: Secondary | ICD-10-CM

## 2016-08-18 DIAGNOSIS — R05 Cough: Secondary | ICD-10-CM

## 2016-08-18 DIAGNOSIS — I10 Essential (primary) hypertension: Secondary | ICD-10-CM | POA: Diagnosis present

## 2016-08-18 DIAGNOSIS — Z8249 Family history of ischemic heart disease and other diseases of the circulatory system: Secondary | ICD-10-CM | POA: Diagnosis not present

## 2016-08-18 DIAGNOSIS — R519 Headache, unspecified: Secondary | ICD-10-CM

## 2016-08-18 DIAGNOSIS — F17210 Nicotine dependence, cigarettes, uncomplicated: Secondary | ICD-10-CM | POA: Diagnosis present

## 2016-08-18 DIAGNOSIS — R51 Headache: Principal | ICD-10-CM

## 2016-08-18 DIAGNOSIS — N390 Urinary tract infection, site not specified: Secondary | ICD-10-CM | POA: Diagnosis present

## 2016-08-18 DIAGNOSIS — F418 Other specified anxiety disorders: Secondary | ICD-10-CM | POA: Diagnosis present

## 2016-08-18 DIAGNOSIS — K219 Gastro-esophageal reflux disease without esophagitis: Secondary | ICD-10-CM | POA: Diagnosis present

## 2016-08-18 DIAGNOSIS — Z7951 Long term (current) use of inhaled steroids: Secondary | ICD-10-CM | POA: Diagnosis not present

## 2016-08-18 DIAGNOSIS — J441 Chronic obstructive pulmonary disease with (acute) exacerbation: Secondary | ICD-10-CM | POA: Diagnosis not present

## 2016-08-18 DIAGNOSIS — J189 Pneumonia, unspecified organism: Secondary | ICD-10-CM | POA: Diagnosis present

## 2016-08-18 DIAGNOSIS — J181 Lobar pneumonia, unspecified organism: Secondary | ICD-10-CM | POA: Diagnosis not present

## 2016-08-18 DIAGNOSIS — R059 Cough, unspecified: Secondary | ICD-10-CM

## 2016-08-18 DIAGNOSIS — Z79899 Other long term (current) drug therapy: Secondary | ICD-10-CM

## 2016-08-18 DIAGNOSIS — Z8541 Personal history of malignant neoplasm of cervix uteri: Secondary | ICD-10-CM

## 2016-08-18 LAB — CBC WITH DIFFERENTIAL/PLATELET
Basophils Absolute: 0 10*3/uL (ref 0.0–0.1)
Basophils Relative: 0 %
Eosinophils Absolute: 0.2 10*3/uL (ref 0.0–0.7)
Eosinophils Relative: 2 %
HEMATOCRIT: 45.3 % (ref 36.0–46.0)
Hemoglobin: 16.4 g/dL — ABNORMAL HIGH (ref 12.0–15.0)
LYMPHS PCT: 5 %
Lymphs Abs: 0.4 10*3/uL — ABNORMAL LOW (ref 0.7–4.0)
MCH: 28.9 pg (ref 26.0–34.0)
MCHC: 36.2 g/dL — AB (ref 30.0–36.0)
MCV: 79.9 fL (ref 78.0–100.0)
MONO ABS: 0.3 10*3/uL (ref 0.1–1.0)
MONOS PCT: 4 %
NEUTROS ABS: 8.1 10*3/uL — AB (ref 1.7–7.7)
Neutrophils Relative %: 89 %
Platelets: 133 10*3/uL — ABNORMAL LOW (ref 150–400)
RBC: 5.67 MIL/uL — ABNORMAL HIGH (ref 3.87–5.11)
RDW: 13.8 % (ref 11.5–15.5)
WBC: 9.1 10*3/uL (ref 4.0–10.5)

## 2016-08-18 LAB — BASIC METABOLIC PANEL
ANION GAP: 10 (ref 5–15)
BUN: 15 mg/dL (ref 6–20)
CALCIUM: 9.3 mg/dL (ref 8.9–10.3)
CO2: 24 mmol/L (ref 22–32)
CREATININE: 0.87 mg/dL (ref 0.44–1.00)
Chloride: 99 mmol/L — ABNORMAL LOW (ref 101–111)
GFR calc Af Amer: >60 mL/min (ref >60)
GFR calc non Af Amer: >60 mL/min (ref >60)
GLUCOSE: 154 mg/dL — AB (ref 65–99)
Potassium: 3.6 mmol/L (ref 3.5–5.1)
Sodium: 133 mmol/L — ABNORMAL LOW (ref 135–145)

## 2016-08-18 LAB — I-STAT TROPONIN, ED: Troponin i, poc: 0 ng/mL (ref 0.00–0.08)

## 2016-08-18 LAB — BRAIN NATRIURETIC PEPTIDE: B NATRIURETIC PEPTIDE 5: 30 pg/mL (ref 0.0–100.0)

## 2016-08-18 MED ORDER — DEXTROSE 5 % IV SOLN: 500.0000 mg | INTRAVENOUS | Status: DC

## 2016-08-18 MED ORDER — ARIPIPRAZOLE 10 MG PO TABS
5.0000 mg | ORAL_TABLET | Freq: Every day | ORAL | Status: DC
  Administered 2016-08-18 – 2016-08-20 (×3): 5 mg via ORAL
  Filled 2016-08-18 (×3): qty 1

## 2016-08-18 MED ORDER — ALBUTEROL SULFATE (2.5 MG/3ML) 0.083% IN NEBU
2.5000 mg | INHALATION_SOLUTION | RESPIRATORY_TRACT | Status: DC | PRN
  Administered 2016-08-19 – 2016-08-21 (×5): 2.5 mg via RESPIRATORY_TRACT
  Filled 2016-08-18 (×5): qty 3

## 2016-08-18 MED ORDER — CEFTRIAXONE SODIUM 1 G IJ SOLR
1.0000 g | Freq: Once | INTRAMUSCULAR | Status: AC
  Administered 2016-08-18: 1 g via INTRAVENOUS
  Filled 2016-08-18: qty 10

## 2016-08-18 MED ORDER — IOPAMIDOL (ISOVUE-370) INJECTION 76%
100.0000 mL | Freq: Once | INTRAVENOUS | Status: AC | PRN
  Administered 2016-08-18: 100 mL via INTRAVENOUS

## 2016-08-18 MED ORDER — SODIUM CHLORIDE 0.9 % IV BOLUS (SEPSIS)
1000.0000 mL | Freq: Once | INTRAVENOUS | Status: AC
Start: 2016-08-18 — End: 2016-08-18
  Administered 2016-08-18: 1000 mL via INTRAVENOUS

## 2016-08-18 MED ORDER — ZOLPIDEM TARTRATE 5 MG PO TABS
5.0000 mg | ORAL_TABLET | Freq: Every day | ORAL | Status: DC
  Administered 2016-08-18 – 2016-08-20 (×3): 5 mg via ORAL
  Filled 2016-08-18 (×3): qty 1

## 2016-08-18 MED ORDER — MOMETASONE FURO-FORMOTEROL FUM 200-5 MCG/ACT IN AERO
INHALATION_SPRAY | RESPIRATORY_TRACT | Status: AC
  Filled 2016-08-18: qty 8.8

## 2016-08-18 MED ORDER — SODIUM CHLORIDE 0.9 % IV BOLUS (SEPSIS)
1000.0000 mL | Freq: Once | INTRAVENOUS | Status: AC
  Administered 2016-08-18: 1000 mL via INTRAVENOUS

## 2016-08-18 MED ORDER — METHYLPREDNISOLONE SODIUM SUCC 125 MG IJ SOLR
60.0000 mg | Freq: Two times a day (BID) | INTRAMUSCULAR | Status: DC
  Administered 2016-08-18 – 2016-08-21 (×6): 60 mg via INTRAVENOUS
  Filled 2016-08-18 (×6): qty 2

## 2016-08-18 MED ORDER — HYDROCHLOROTHIAZIDE 25 MG PO TABS
12.5000 mg | ORAL_TABLET | Freq: Every day | ORAL | Status: DC
  Administered 2016-08-19 – 2016-08-21 (×3): 12.5 mg via ORAL
  Filled 2016-08-18 (×3): qty 1

## 2016-08-18 MED ORDER — DEXTROSE 5 % IV SOLN
1.0000 g | INTRAVENOUS | Status: DC
  Administered 2016-08-19 – 2016-08-20 (×2): 1 g via INTRAVENOUS
  Filled 2016-08-18 (×3): qty 10

## 2016-08-18 MED ORDER — PREDNISONE 20 MG PO TABS
40.0000 mg | ORAL_TABLET | Freq: Once | ORAL | Status: AC
  Administered 2016-08-18: 40 mg via ORAL
  Filled 2016-08-18: qty 2

## 2016-08-18 MED ORDER — VENLAFAXINE HCL ER 75 MG PO CP24
300.0000 mg | ORAL_CAPSULE | Freq: Every day | ORAL | Status: DC
  Administered 2016-08-19 – 2016-08-21 (×3): 300 mg via ORAL
  Filled 2016-08-18 (×3): qty 4

## 2016-08-18 MED ORDER — MOMETASONE FURO-FORMOTEROL FUM 200-5 MCG/ACT IN AERO
2.0000 | INHALATION_SPRAY | Freq: Two times a day (BID) | RESPIRATORY_TRACT | Status: DC
  Administered 2016-08-18 – 2016-08-21 (×6): 2 via RESPIRATORY_TRACT
  Filled 2016-08-18: qty 8.8

## 2016-08-18 MED ORDER — CLONAZEPAM 0.5 MG PO TABS
1.0000 mg | ORAL_TABLET | Freq: Every day | ORAL | Status: DC
  Administered 2016-08-18 – 2016-08-20 (×3): 1 mg via ORAL
  Filled 2016-08-18 (×3): qty 2

## 2016-08-18 MED ORDER — IPRATROPIUM-ALBUTEROL 0.5-2.5 (3) MG/3ML IN SOLN
3.0000 mL | Freq: Once | RESPIRATORY_TRACT | Status: AC
  Administered 2016-08-18: 3 mL via RESPIRATORY_TRACT
  Filled 2016-08-18: qty 3

## 2016-08-18 MED ORDER — IOPAMIDOL (ISOVUE-300) INJECTION 61%
75.0000 mL | Freq: Once | INTRAVENOUS | Status: AC | PRN
  Administered 2016-08-18: 75 mL via INTRAVENOUS

## 2016-08-18 MED ORDER — ENOXAPARIN SODIUM 40 MG/0.4ML ~~LOC~~ SOLN
40.0000 mg | SUBCUTANEOUS | Status: DC
  Administered 2016-08-18: 40 mg via SUBCUTANEOUS
  Filled 2016-08-18 (×2): qty 0.4

## 2016-08-18 MED ORDER — DEXTROSE 5 % IV SOLN
500.0000 mg | INTRAVENOUS | Status: DC
  Administered 2016-08-18 – 2016-08-20 (×3): 500 mg via INTRAVENOUS
  Filled 2016-08-18 (×4): qty 500

## 2016-08-18 MED ORDER — LEVOTHYROXINE SODIUM 112 MCG PO TABS
112.0000 ug | ORAL_TABLET | Freq: Every day | ORAL | Status: DC
  Administered 2016-08-19 – 2016-08-21 (×3): 112 ug via ORAL
  Filled 2016-08-18 (×3): qty 1

## 2016-08-18 MED ORDER — ALBUTEROL SULFATE (2.5 MG/3ML) 0.083% IN NEBU
2.5000 mg | INHALATION_SOLUTION | Freq: Once | RESPIRATORY_TRACT | Status: AC
  Administered 2016-08-18: 2.5 mg via RESPIRATORY_TRACT
  Filled 2016-08-18: qty 3

## 2016-08-18 NOTE — ED Notes (Signed)
Patient walked to the bathroom with minimal assistance.  

## 2016-08-18 NOTE — ED Provider Notes (Signed)
[facility] DEPT Provider Note   CSN: [id number] Arrival date & time: 08/18/16  1609     History   Chief Complaint Chief Complaint  Patient presents with  . Shortness of Breath    HPI Dorothy [name] is a 60 y.o. female.  The history is provided by the patient.  Shortness of Breath  This is a new problem. The average episode lasts 3 days. The problem occurs continuously.The current episode started more than 2 days ago. The problem has been gradually worsening. Associated symptoms include headaches, cough and sputum production. Pertinent negatives include no fever, no rhinorrhea, no sore throat, no chest pain, no syncope, no leg pain and no leg swelling. It is unknown what precipitated the problem. Risk factors: none. She has tried nothing for the symptoms. She has had no prior hospitalizations. She has had no prior ED visits. She has had no prior ICU admissions. Associated medical issues include chronic lung disease. Associated medical issues do not include asthma, COPD, PE, CAD, past MI, DVT or recent surgery.    60 year old female who presents with shortness of breath. Remote history of ovarian cervical cancer, in remission, and history of hypertension and chronic mesenteric ischemia. Onset of productive cough and progressively worsening shortness of breath/dyspnea on exertion since 3-4 days ago. Has had fatigue, generalized weakness, and one day of chills. No fever, chest pain, lower extremity edema or pain, vomiting, or diarrhea. Symptoms also associated with generalized headache. Not taking any medications for her symptoms. No recent immobilization, exogenous hormones, personal or family history of PE/DVT.  Seen by her primary care provider today and sent for outpatient chest x-ray and CT head. Chest x-ray was concerning for left lower lobe infiltrate suggestive of pneumonia versus pulmonary infarct. She was subsequently sent to ED for further evaluation by her PCP.  Past Medical  History:  Diagnosis Date  . Anxiety   . Arthritis    hands and Knees  . Avascular necrosis of bones of both hips ([place]) 01/17/2012  . Benign essential HTN 01/17/2012  . Cancer ([place]) 1982   ovarian  . Cancer of cervix ([place])   . Constipation   . Depression    Takes Klonopin  . Depression with anxiety 01/17/2012  . GERD (gastroesophageal reflux disease)   . Hypercholesteremia   . Hypertension    takes meds  . Hypothyroidism    on Levonthyroxine  . Hypothyroidism (acquired) 01/17/2012   Since age 32  . Ovarian cyst 01/17/2012   1995  BSO  . Personal history of colonic polyps 11/07/2007  . Pneumonia    x 2- last time > 5 years ago  . Pneumonia 01/17/2012   10 yrs ago and 5 years ago   . Rash and other nonspecific skin eruption 01/17/2012   Dorsum hands & extensor surfaces arms; intermittent    Patient Active Problem List   Diagnosis Date Noted  . Pulmonary nodule 08/18/2016  . COPD with exacerbation ([place]) 08/18/2016  . 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 Acute respiratory failure with hypoxia ([place]) 09/25/2012  . Hyponatremia 07/10/2012  . Abdominal pain 07/10/2012  . Hematochezia 07/10/2012  . Avascular necrosis of bones of both hips ([place]) 01/17/2012  . Hypothyroidism (acquired) 01/17/2012  . Benign essential HTN 01/17/2012  . Depression with anxiety 01/17/2012  . Cancer of cervix ([place]) 01/17/2012  . Ovarian cyst 01/17/2012  . Pneumonia 01/17/2012  . 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 Rash and other nonspecific skin eruption 01/17/2012  .  Personal history of colonic polyps 11/07/2007    Past Surgical History:  Procedure Laterality Date  . ABDOMINAL HYSTERECTOMY  1982   ovaries retained--for CIS  . CELIAC ARTERY BYPASS  09/25/2012   Procedure: CELIAC ARTERY BYPASS;  Surgeon: Elam Dutch, MD;  Location: [facility] OR;  Service: Vascular;  Laterality: N/A;  Bypass using Hemashield Gold Vascular Graft 67mm x 25mm x 40cm  . COLONOSCOPY  2009    [place]-polyps, 5 yr FU  . JOINT REPLACEMENT    . MESENTERIC ARTERY BYPASS  09/25/2012   Procedure: MESENTERIC ARTERY BYPASS;  Surgeon: Elam Dutch, MD;  Location: [facility] OR;  Service: Vascular;  Laterality: N/A;  Bypass using Hemashield Gold Vascular Graft 52mm x 96mm x 40cm  . 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 TOTAL HIP ARTHROPLASTY  09/06/2011   Procedure: TOTAL HIP ARTHROPLASTY;  Surgeon: Ninetta Lights, MD;  Location: [facility];  Service: Orthopedics;  Laterality: Left;  TOTAL HIP ARTHROPLASTY LEFT SIDE  . TOTAL HIP ARTHROPLASTY  10/11/2011   Procedure: TOTAL HIP ARTHROPLASTY;  Surgeon: Ninetta Lights, MD;  Location: [facility];  Service: Orthopedics;  Laterality: Right;  120 MINUTES FOR THIS SURGERY  . WOUND EXPLORATION  10/02/2012   Procedure: WOUND EXPLORATION;  Surgeon: Elam Dutch, MD;  Location: [facility] OR;  Service: Vascular;  Laterality: N/A;  abdominal wound exploration    OB History    Gravida Para Term Preterm AB Living   2 1 1   1 1    SAB TAB Ectopic Multiple Live Births   1               Home Medications    Prior to Admission medications   Medication Sig Start Date End Date Taking? Authorizing Provider  ADVAIR DISKUS 250-50 MCG/DOSE AEPB  01/25/15   Historical Provider, MD  albuterol (PROVENTIL HFA;VENTOLIN HFA) 108 (90 BASE) MCG/ACT inhaler Inhale 1-2 puffs into the lungs every 6 (six) hours as needed for wheezing or shortness of breath. Patient not taking: Reported on 05/04/2016 12/10/13   Milton Ferguson, MD  ARIPiprazole (ABILIFY) 5 MG tablet Take 5 mg by mouth at bedtime.    Historical Provider, MD  CALCIUM PO Take 1 tablet by mouth 2 (two) times daily.    Historical Provider, MD  clonazePAM (KLONOPIN) 0.5 MG tablet Take 0.75 mg by mouth 2 (two) times daily as needed for anxiety.    Historical Provider, MD  hydrochlorothiazide (HYDRODIURIL) 25 MG tablet Take 12.5 mg by mouth daily.    Historical Provider, MD  levofloxacin  (LEVAQUIN) 500 MG tablet Take 500 mg by mouth daily. 10 day course starting on 08/18/2016 08/18/16   Historical Provider, MD  levothyroxine (SYNTHROID, LEVOTHROID) 112 MCG tablet Take 112 mcg by mouth daily.    Historical Provider, MD  Multiple Vitamins-Minerals (MULTIVITAMINS THER. W/MINERALS) TABS tablet Take 1 tablet by mouth daily.    Historical Provider, MD  nitrofurantoin, macrocrystal-monohydrate, (MACROBID) 100 MG capsule Take 100 mg by mouth 2 (two) times daily. 5 day course starting on 08/15/2016 08/15/16   Historical Provider, MD  predniSONE (DELTASONE) 20 MG tablet Take 20 mg by mouth 2 (two) times daily. 9 day course starting on 08/18/2016 08/18/16   Historical Provider, MD  simvastatin (ZOCOR) 20 MG tablet Take 1 tablet by mouth daily. 02/16/16   Historical Provider, MD  venlafaxine (EFFEXOR-XR) 150 MG 24 hr capsule Take 300 mg by mouth daily with breakfast.  Historical Provider, MD  zolpidem (AMBIEN) 5 MG tablet Take 1 tablet by mouth at bedtime. 02/16/16   Historical Provider, MD    Family History Family History  Problem Relation Age of Onset  . Breast cancer Sister 85    dec age 3  . Cancer Sister   . Cancer Mother     lung  . Heart failure Father   . Hypertension Son   . Ovarian cancer Paternal 17     dec age 79  . Anesthesia problems Neg Hx   . Hypotension Neg Hx   . Malignant hyperthermia Neg Hx   . Pseudochol deficiency Neg Hx   . Colon cancer Neg Hx   . Esophageal cancer Neg Hx   . Rectal cancer Neg Hx   . Stomach cancer Neg Hx     Social History Social History  Substance Use Topics  . Smoking status: Former Smoker    Packs/day: 0.50    Years: 40.00    Types: Cigarettes  . Smokeless tobacco: Never Used     Comment: pt states that she wants to quit but needs help was advised to call 1-800-QUITNOW and to speak with PCP about avaliable aids  . Alcohol use No     Allergies   Sulfa antibiotics   Review of Systems Review of Systems    Constitutional: Negative for fever.  HENT: Negative for rhinorrhea and sore throat.   Respiratory: Positive for cough, sputum production and shortness of breath.   Cardiovascular: Negative for chest pain, leg swelling and syncope.  Neurological: Positive for headaches.  All other systems reviewed and are negative.    Physical Exam Updated Vital Signs BP (!) 87/69   Pulse 72   Temp 97.8 F (36.6 C) (Oral)   Resp 21   Ht 5' 7.5" (1.715 m)   Wt 144 lb (65.3 kg)   LMP 10/23/1980 Comment: Hyst age 51(ovaries retained)  SpO2 92%   BMI 22.22 kg/m   Physical Exam Physical Exam  Nursing note and vitals reviewed. Constitutional: appears low energy, non-toxic, and in no acute distress Head: Normocephalic and atraumatic.  Mouth/Throat: Oropharynx is clear and moist.  Neck: Normal range of motion. Neck supple.  Cardiovascular: Normal rate and regular rhythm.  no edema Pulmonary/Chest: Effort normal and breath sounds with scattered wheezes.  Abdominal: Soft. There is no tenderness. There is no rebound and no guarding.  Musculoskeletal: Normal range of motion.  Neurological: Alert, no facial droop, fluent speech, moves all extremities symmetrically Skin: Skin is warm and dry.  Psychiatric: Cooperative    ED Treatments / Results  Labs (all labs ordered are listed, but only abnormal results are displayed) Labs Reviewed  CBC WITH DIFFERENTIAL/PLATELET - Abnormal; Notable for the following:       Result Value   RBC 5.67 (*)    Hemoglobin 16.4 (*)    MCHC 36.2 (*)    Platelets 133 (*)    Neutro Abs 8.1 (*)    Lymphs Abs 0.4 (*)    All other components within normal limits  BASIC METABOLIC PANEL - Abnormal; Notable for the following:    Sodium 133 (*)    Chloride 99 (*)    Glucose, Bld 154 (*)    All other components within normal limits  BRAIN NATRIURETIC PEPTIDE  I-STAT TROPOININ, ED    EKG  EKG Interpretation None       Radiology Dg Chest 2 View  Result Date:  08/18/2016 CLINICAL DATA:  Productive cough. EXAM:  CHEST  2 VIEW COMPARISON:  04/27/2014. FINDINGS: Mediastinum and hilar structures normal. Heart size normal. Mild infiltrate noted in the peripheral left lung base. Small adjacent pleural effusion. Given the peripheral nature of the of mild infiltrate possibly pulmonary infarct cannot be entirely excluded. Degenerative changes thoracic spine IMPRESSION: Mild infiltrate peripheral left lung base. This is most likely a small focus of pneumonia. Tiny associated left pleural effusion noted. Given the peripheral nature of this small infiltrate the possibly the pulmonary infarct cannot be completely excluded. Electronically Signed   By: Hagerman   On: 08/18/2016 14:12   Ct Head W & Wo Contrast  Result Date: 08/18/2016 CLINICAL DATA:  Frontal headache for 3 days.  Congestion. EXAM: CT HEAD WITHOUT AND WITH CONTRAST TECHNIQUE: Contiguous axial images were obtained from the base of the skull through the vertex without and with intravenous contrast CONTRAST:  93mL ISOVUE-300 IOPAMIDOL (ISOVUE-300) INJECTION 61%<Contrast>76mL ISOVUE-300 IOPAMIDOL (ISOVUE-300) INJECTION 61% COMPARISON:  None. FINDINGS: Brain: No acute infarct, hemorrhage, or mass lesion is present. The ventricles are of normal size. No significant extraaxial fluid collection is present. No significant white matter disease is present. The postcontrast images demonstrate no pathologic enhancement. Vascular: Atherosclerotic calcifications are present within the cavernous internal carotid arteries bilaterally. There is no significant hyperdense vessel. Skull: Calvarium is intact. No focal lytic or blastic lesions are present. Sinuses/Orbits: A single posterior left ethmoid air cell is opacified. The remaining paranasal sinuses and the mastoid air cells are clear. IMPRESSION: Negative CT of the head without and with contrast. Electronically Signed   By: San Morelle M.D.   On: 08/18/2016 13:32     Ct Angio Chest Pe W Or Wo Contrast  Result Date: 08/18/2016 CLINICAL DATA:  Dyspnea on exertion, cough for 4 days of hypoxia EXAM: CT ANGIOGRAPHY CHEST WITH CONTRAST TECHNIQUE: Multidetector CT imaging of the chest was performed using the standard protocol during bolus administration of intravenous contrast. Multiplanar CT image reconstructions and MIPs were obtained to evaluate the vascular anatomy. CONTRAST:  100 cc IV Isovue 370 COMPARISON:  Same day CXR FINDINGS: Cardiovascular: The study is of quality for the evaluation of pulmonary embolism. There are no filling defects in the central, lobar, segmental or subsegmental pulmonary artery branches to suggest acute pulmonary embolism. Great vessels are normal in course and caliber. Normal heart size. No significant pericardial fluid/thickening. Mediastinum/Nodes: No discrete thyroid nodules. Unremarkable esophagus. No pathologically enlarged axillary, mediastinal or hilar lymph nodes. Small subcentimeter paratracheal and tracheobronchial lymph nodes. Lungs/Pleura: No pneumothorax. No pleural effusion. Bilateral upper lobe predominant centrilobular emphysema. Nonspecific patchy ground-glass opacities are also seen in both upper lobes, right middle lobe and lingula. There is mild peribronchial thickening seen in both lower lobes. No effusion. There is atelectasis in the left lower lobe laterally and along the dependent aspect of both lower lobes. Small 6 mm subpleural right middle lobe lateral segment nodular density, series 9, image 65. Upper abdomen: There are up to 4 or more circumscribed left hepatic hypodensities statistically consistent with cysts or hemangiomata the largest measuring approximately 9 mm. The visualized gallbladder is unremarkable. Is no splenomegaly. No adrenal mass. Musculoskeletal: No aggressive appearing focal osseous lesions. Bone islands of the right humeral head. Review of the MIP images confirms the above findings. IMPRESSION: No  large central pulmonary embolus. Centrilobular emphysema upper lobe predominant with nonspecific bilateral upper lobe, right middle lobe and lingular ground-glass opacities which may reflect interstitial pneumonia, sequela of alveolar/pulmonary edema, pneumonitis/ alveolitis among some considerations though not  exclusive. Bibasilar dependent atelectasis. Tiny 5 mm subpleural right middle lobe density may reflect a focus of atelectasis. As a small nodule is not entirely excluded, follow up per Fleischner Society recommendations. Non-contrast chest CT at 6-12 months is recommended. If the nodule is stable at time of repeat CT, then future CT at 18-24 months (from today's scan) is considered optional for low-risk patients, but is recommended for high-risk patients. This recommendation follows the consensus statement: Guidelines for Management of Incidental Pulmonary Nodules Detected on CT Images: From the Fleischner Society 2017; Radiology 2017; 284:228-243. Electronically Signed   By: Ashley Royalty M.D.   On: 08/18/2016 18:14    Procedures Procedures (including critical care time)  Medications Ordered in ED Medications  albuterol (PROVENTIL) (2.5 MG/3ML) 0.083% nebulizer solution 2.5 mg (not administered)  cefTRIAXone (ROCEPHIN) 1 g in dextrose 5 % 50 mL IVPB (1 g Intravenous New Bag/Given 08/18/16 1850)  azithromycin (ZITHROMAX) 500 mg in dextrose 5 % 250 mL IVPB (not administered)  sodium chloride 0.9 % bolus 1,000 mL (1,000 mLs Intravenous New Bag/Given 08/18/16 1646)  ipratropium-albuterol (DUONEB) 0.5-2.5 (3) MG/3ML nebulizer solution 3 mL (3 mLs Nebulization Given 08/18/16 1656)  iopamidol (ISOVUE-370) 76 % injection 100 mL (100 mLs Intravenous Contrast Given 08/18/16 1738)  sodium chloride 0.9 % bolus 1,000 mL (1,000 mLs Intravenous New Bag/Given 08/18/16 1828)  predniSONE (DELTASONE) tablet 40 mg (40 mg Oral Given 08/18/16 1850)     Initial Impression / Assessment and Plan / ED Course  I have  reviewed the triage vital signs and the nursing notes.  Pertinent labs & imaging results that were available during my care of the patient were reviewed by me and considered in my medical decision making (see chart for details).  Clinical Course   21 -year-old female who presents with cough, shortness of breath, abnormal chest x-ray. On arrival she is 84% on room air, with 2 L oxygen requirement new here. Blood pressures in the mid to high 90 systolic, slightly lower than her baseline, but she is mentating well. She is not tachycardic or febrile. With scattered wheezes on lung exam, and she does not have prior history of COPD or emphysema but is a long-time smoker. Blood work with negative troponin, normal BNP, and no acute leukocytosis. CT PE was performed showing no evidence of acute PE. She does have some interstitial opacities suggestive of infection versus edema versus inflammation. Given that she may have clinical symptoms of pneumonia to cover her with ceftriaxone and azithromycin for potential community acquired pneumonia. Also emphysematous changes noted on CT, there could be a component of COPD exacerbation for her. Given additional breathing treatments and steroids. Given her persistent oxygen requirement, discussed with hospitalist service who will admit for ongoing management.  Final Clinical Impressions(s) / ED Diagnoses   Final diagnoses:  Community acquired pneumonia of left lung, unspecified part of lung  COPD exacerbation ([place])  Hypoxia    New Prescriptions New Prescriptions   No medications on file     Forde Dandy, MD 08/18/16 1900

## 2016-08-18 NOTE — Progress Notes (Signed)
[place] for Renal Adjustment of ABX if needed ABX:  Rocephin and Zithromax  Allergies  Allergen Reactions  . Sulfa Antibiotics Rash   Patient Measurements: Height: 5' 7.5" (171.5 cm) Weight: 148 lb 12.8 oz (67.5 kg) IBW/kg (Calculated) : 62.75  Vital Signs: Temp: 98.3 F (36.8 C) (10/27 2054) Temp Source: Oral (10/27 2054) BP: 116/70 (10/27 2054) Pulse Rate: 85 (10/27 2054)  Labs:  Recent Labs  08/18/16 1626  WBC 9.1  HGB 16.4*  PLT 133*  CREATININE 0.87   No results for input(s): VANCOTROUGH, VANCOPEAK, VANCORANDOM, GENTTROUGH, GENTPEAK, GENTRANDOM, TOBRATROUGH, TOBRAPEAK, TOBRARND, AMIKACINPEAK, AMIKACINTROU, AMIKACIN in the last 72 hours.   Medical History: Past Medical History:  Diagnosis Date  . Anxiety   . Arthritis    hands and Knees  . Avascular necrosis of bones of both hips ([place]) 01/17/2012  . Benign essential HTN 01/17/2012  . Cancer ([place]) 1982   ovarian  . Cancer of cervix ([place])   . Constipation   . Depression    Takes Klonopin  . Depression with anxiety 01/17/2012  . GERD (gastroesophageal reflux disease)   . Hypercholesteremia   . Hypertension    takes meds  . Hypothyroidism    on Levonthyroxine  . Hypothyroidism (acquired) 01/17/2012   Since age 43  . Ovarian cyst 01/17/2012   1995  BSO  . Personal history of colonic polyps 11/07/2007  . Pneumonia    x 2- last time > 5 years ago  . Pneumonia 01/17/2012   10 yrs ago and 5 years ago   . Rash and other nonspecific skin eruption 01/17/2012   Dorsum hands & extensor surfaces arms; intermittent   Assessment: 60 started on Rocephin and Zithromax.  No renal adjustment needed.  Estimated Creatinine Clearance: 69 mL/min (by C-G formula based on SCr of 0.87 mg/dL).  Plan: Continue current Rx F/U to switch Zithromax to PO  Ena Dawley, [facility] 08/18/2016

## 2016-08-18 NOTE — ED Notes (Signed)
Pt is 86% on RA, placed on 2L [place].

## 2016-08-18 NOTE — ED Triage Notes (Signed)
Pt states she had outpatient chest xray today. States she was called by pcp and told to come to ED for pneumonia and possible blood clot.

## 2016-08-18 NOTE — ED Notes (Signed)
Pt reports she was seen by PCP today, sent here for head CT and chest x-ray earlier. Was called back and told to come to ED due to pneumonia and to rule out PE. States chest "tightness" and SOB. Productive cough X1 day with clear sputum.

## 2016-08-18 NOTE — H&P (Signed)
History and Physical  [name] [id number] DOB: [birth year] DOA: 08/18/2016  Referring physician: Dr Oleta Mouse, ED physician PCP: Renee Rival, NP  Outpatient Specialists: none  Chief Complaint: Cough, shortness of breath  HPI: Dorothy [name] is a 60 y.o. female with a history of ovarian and cervical cancer status post treatment, hypertension, hyperlipidemia, depression on antidepressants and anxiolytics. She started having productive cough with white sputum production and shortness of breath 3 days ago. She saw her primary care physician today who ordered an outpatient x-ray, which showed a right infiltrate and was read as probable pneumonia, but possibly right lung infarct. Patient was instructed to come here for further evaluation. Her shortness of breath is worse with ambulation and improved with rest. Her symptoms are worsening. She denies current fevers or chills.  Course in ED: Patient had CTA showed inflammatory changes with emphysema. CBC is normal. Temperature is normal. Her initial oxygen saturation was 84%, which improved to mid 90s on 2 L nasal cannula. Patient had numerous treatments and steroids, which improved her symptoms.  Review of Systems:   Pt denies any fevers, chills, nausea, vomiting, diarrhea, constipation, abdominal pain,  orthopnea, wheezing, palpitations, headache, vision changes, lightheadedness, dizziness, melena, rectal bleeding.  Review of systems are otherwise negative  Past Medical History:  Diagnosis Date  . Anxiety   . Arthritis    hands and Knees  . Avascular necrosis of bones of both hips ([place]) 01/17/2012  . Benign essential HTN 01/17/2012  . Cancer ([place]) 1982   ovarian  . Cancer of cervix ([place])   . Constipation   . Depression    Takes Klonopin  . Depression with anxiety 01/17/2012  . GERD (gastroesophageal reflux disease)   . Hypercholesteremia   . Hypertension    takes meds  . Hypothyroidism    on Levonthyroxine  . Hypothyroidism  (acquired) 01/17/2012   Since age 72  . Ovarian cyst 01/17/2012   1995  BSO  . Personal history of colonic polyps 11/07/2007  . Pneumonia    x 2- last time > 5 years ago  . Pneumonia 01/17/2012   10 yrs ago and 5 years ago   . Rash and other nonspecific skin eruption 01/17/2012   Dorsum hands & extensor surfaces arms; intermittent   Past Surgical History:  Procedure Laterality Date  . ABDOMINAL HYSTERECTOMY  1982   ovaries retained--for CIS  . CELIAC ARTERY BYPASS  09/25/2012   Procedure: CELIAC ARTERY BYPASS;  Surgeon: Elam Dutch, MD;  Location: [facility] OR;  Service: Vascular;  Laterality: N/A;  Bypass using Hemashield Gold Vascular Graft 15mm x 31mm x 40cm  . COLONOSCOPY  2009   [place]-polyps, 5 yr FU  . JOINT REPLACEMENT    . MESENTERIC ARTERY BYPASS  09/25/2012   Procedure: MESENTERIC ARTERY BYPASS;  Surgeon: Elam Dutch, MD;  Location: [facility] OR;  Service: Vascular;  Laterality: N/A;  Bypass using Hemashield Gold Vascular Graft 31mm x 46mm x 40cm  . 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 TOTAL HIP ARTHROPLASTY  09/06/2011   Procedure: TOTAL HIP ARTHROPLASTY;  Surgeon: Ninetta Lights, MD;  Location: [place];  Service: Orthopedics;  Laterality: Left;  TOTAL HIP ARTHROPLASTY LEFT SIDE  . TOTAL HIP ARTHROPLASTY  10/11/2011   Procedure: TOTAL HIP ARTHROPLASTY;  Surgeon: Ninetta Lights, MD;  Location: [place];  Service: Orthopedics;  Laterality: Right;  120 MINUTES FOR THIS SURGERY  . WOUND EXPLORATION  10/02/2012   Procedure: WOUND EXPLORATION;  Surgeon: Elam Dutch, MD;  Location: [facility] OR;  Service: Vascular;  Laterality: N/A;  abdominal wound exploration   Social History:  reports that she has quit smoking. Her smoking use included Cigarettes. She has a 20.00 pack-year smoking history. She has never used smokeless tobacco. She reports that she does not drink alcohol or use drugs. Patient lives at [facility]  .  Sulfa Antibiotics Rash    Family History  Problem Relation Age of Onset  . Breast cancer Sister 75    dec age 95  . Cancer Sister   . Cancer Mother     lung  . Heart failure Father   . Hypertension Son   . Ovarian cancer Paternal 34     dec age 29  . Anesthesia problems Neg Hx   . Hypotension Neg Hx   . Malignant hyperthermia Neg Hx   . Pseudochol deficiency Neg Hx   . Colon cancer Neg Hx   . Esophageal cancer Neg Hx   . Rectal cancer Neg Hx   . Stomach cancer Neg Hx      Prior to Admission medications   Medication Sig Start Date End Date Taking? Authorizing Provider  ADVAIR DISKUS 250-50 MCG/DOSE AEPB Inhale 1 puff into the lungs 2 (two) times daily.  01/25/15  Yes Historical Provider, MD  albuterol (PROVENTIL HFA;VENTOLIN HFA) 108 (90 BASE) MCG/ACT inhaler Inhale 1-2 puffs into the lungs every 6 (six) hours as needed for wheezing or shortness of breath. 12/10/13  Yes Milton Ferguson, MD  levofloxacin (LEVAQUIN) 500 MG tablet Take 500 mg by mouth daily. 10 day course starting on 08/18/2016 08/18/16  Yes Historical Provider, MD  nitrofurantoin, macrocrystal-monohydrate, (MACROBID) 100 MG capsule Take 100 mg by mouth 2 (two) times daily. 5 day course starting on 08/15/2016 08/15/16  Yes Historical Provider, MD  predniSONE (DELTASONE) 20 MG tablet Take 20 mg by mouth 2 (two) times daily. 9 day course starting on 08/18/2016 08/18/16  Yes Historical Provider, MD  ARIPiprazole (ABILIFY) 5 MG tablet Take 5 mg by mouth at bedtime.    Historical Provider, MD  clonazePAM (KLONOPIN) 0.5 MG tablet Take 0.75 mg by mouth 2 (two) times daily as needed for anxiety.    Historical Provider, MD  hydrochlorothiazide (HYDRODIURIL) 25 MG tablet Take 12.5 mg by mouth daily.    Historical Provider, MD  levothyroxine (SYNTHROID, LEVOTHROID) 112 MCG tablet Take 112 mcg by mouth daily.    Historical Provider, MD  Multiple Vitamins-Minerals (MULTIVITAMINS THER. W/MINERALS) TABS tablet Take 1 tablet by  mouth daily.    Historical Provider, MD  simvastatin (ZOCOR) 20 MG tablet Take 1 tablet by mouth daily. 02/16/16   Historical Provider, MD  venlafaxine (EFFEXOR-XR) 150 MG 24 hr capsule Take 300 mg by mouth daily with breakfast.     Historical Provider, MD  zolpidem (AMBIEN) 5 MG tablet Take 1 tablet by mouth at bedtime. 02/16/16   Historical Provider, MD    Physical Exam: BP 117/63   Pulse 66   Temp 97.8 F (36.6 C) (Oral)   Resp 16   Ht 5' 7.5" (1.715 m)   Wt 65.3 kg (144 lb)   LMP 10/23/1980 Comment: Hyst age 26(ovaries retained)  SpO2 97%   BMI 22.22 kg/m   General: Middle-aged Caucasian female. Awake and alert and oriented x3. No acute cardiopulmonary distress.  HEENT: Normocephalic atraumatic.  Right and left ears normal in appearance.  Pupils equal, round,  reactive to light. Extraocular muscles are intact. Sclerae anicteric and noninjected.  Moist mucosal membranes. No mucosal lesions.  Neck: Neck supple without lymphadenopathy. No carotid bruits. No masses palpated.  Cardiovascular: Regular rate with normal S1-S2 sounds. No murmurs, rubs, gallops auscultated. No JVD.  Respiratory: Good air movement. Rales in the right midlung field and base. Slight wheezing. No accessory muscle use. Abdomen: Soft, nontender, nondistended. Active bowel sounds. No masses or hepatosplenomegaly  Skin: No rashes, lesions, or ulcerations.  Dry, warm to touch. 2+ dorsalis pedis and radial pulses. Musculoskeletal: No calf or leg pain. All major joints not erythematous nontender.  No upper or lower joint deformation.  Good ROM.  No contractures  Psychiatric: Intact judgment and insight. Pleasant and cooperative. Neurologic: No focal neurological deficits. Strength is 5/5 and symmetric in upper and lower extremities.  Cranial nerves II through XII are grossly intact.           Labs on Admission: I have personally reviewed following labs and imaging studies  CBC:  Recent Labs Lab 08/18/16 1626  WBC  9.1  NEUTROABS 8.1*  HGB 16.4*  HCT 45.3  MCV 79.9  PLT [id number]*   Basic Metabolic Panel:  Recent Labs Lab 08/18/16 1626  NA 133*  K 3.6  CL 99*  CO2 24  GLUCOSE 154*  BUN 15  CREATININE 0.87  CALCIUM 9.3   GFR: Estimated Creatinine Clearance: 69 mL/min (by C-G formula based on SCr of 0.87 mg/dL). Liver Function Tests: No results for input(s): AST, ALT, ALKPHOS, BILITOT, PROT, ALBUMIN in the last 168 hours. No results for input(s): LIPASE, AMYLASE in the last 168 hours. No results for input(s): AMMONIA in the last 168 hours. Coagulation Profile: No results for input(s): INR, PROTIME in the last 168 hours. Cardiac Enzymes: No results for input(s): CKTOTAL, CKMB, CKMBINDEX, TROPONINI in the last 168 hours. BNP (last 3 results) No results for input(s): PROBNP in the last 8760 hours. HbA1C: No results for input(s): HGBA1C in the last 72 hours. CBG: No results for input(s): GLUCAP in the last 168 hours. Lipid Profile: No results for input(s): CHOL, HDL, LDLCALC, TRIG, CHOLHDL, LDLDIRECT in the last 72 hours. Thyroid Function Tests: No results for input(s): TSH, T4TOTAL, FREET4, T3FREE, THYROIDAB in the last 72 hours. Anemia Panel: No results for input(s): VITAMINB12, FOLATE, FERRITIN, TIBC, IRON, RETICCTPCT in the last 72 hours. Urine analysis:    Component Value Date/Time   COLORURINE YELLOW 09/13/2012 1028   APPEARANCEUR CLOUDY (A) 09/13/2012 1028   LABSPEC 1.019 09/13/2012 1028   PHURINE 6.5 09/13/2012 1028   GLUCOSEU NEGATIVE 09/13/2012 1028   HGBUR NEGATIVE 09/13/2012 1028   BILIRUBINUR n 02/17/2016 1312   KETONESUR NEGATIVE 09/13/2012 1028   PROTEINUR n 02/17/2016 1312   PROTEINUR NEGATIVE 09/13/2012 1028   UROBILINOGEN negative 02/17/2016 1312   UROBILINOGEN 0.2 09/13/2012 1028   NITRITE n 02/17/2016 1312   NITRITE NEGATIVE 09/13/2012 1028   LEUKOCYTESUR Negative 02/17/2016 1312   Sepsis Labs: @LABRCNTIP (procalcitonin:4,lacticidven:4) )No results found  for this or any previous visit (from the past 240 hour(s)).   Radiological Exams on Admission: Dg Chest 2 View  Result Date: 08/18/2016 CLINICAL DATA:  Productive cough. EXAM: CHEST  2 VIEW COMPARISON:  04/27/2014. FINDINGS: Mediastinum and hilar structures normal. Heart size normal. Mild infiltrate noted in the peripheral left lung base. Small adjacent pleural effusion. Given the peripheral nature of the of mild infiltrate possibly pulmonary infarct cannot be entirely excluded. Degenerative changes thoracic spine IMPRESSION: Mild infiltrate peripheral left lung  base. This is most likely a small focus of pneumonia. Tiny associated left pleural effusion noted. Given the peripheral nature of this small infiltrate the possibly the pulmonary infarct cannot be completely excluded. Electronically Signed   By: [place]   On: 08/18/2016 14:12   Ct Head W & Wo Contrast  Result Date: 08/18/2016 CLINICAL DATA:  Frontal headache for 3 days.  Congestion. EXAM: CT HEAD WITHOUT AND WITH CONTRAST TECHNIQUE: Contiguous axial images were obtained from the base of the skull through the vertex without and with intravenous contrast CONTRAST:  43mL ISOVUE-300 IOPAMIDOL (ISOVUE-300) INJECTION 61%<Contrast>44mL ISOVUE-300 IOPAMIDOL (ISOVUE-300) INJECTION 61% COMPARISON:  None. FINDINGS: Brain: No acute infarct, hemorrhage, or mass lesion is present. The ventricles are of normal size. No significant extraaxial fluid collection is present. No significant white matter disease is present. The postcontrast images demonstrate no pathologic enhancement. Vascular: Atherosclerotic calcifications are present within the cavernous internal carotid arteries bilaterally. There is no significant hyperdense vessel. Skull: Calvarium is intact. No focal lytic or blastic lesions are present. Sinuses/Orbits: A single posterior left ethmoid air cell is opacified. The remaining paranasal sinuses and the mastoid air cells are clear.  IMPRESSION: Negative CT of the head without and with contrast. Electronically Signed   By: San Morelle M.D.   On: 08/18/2016 13:32   Ct Angio Chest Pe W Or Wo Contrast  Result Date: 08/18/2016 CLINICAL DATA:  Dyspnea on exertion, cough for 4 days of hypoxia EXAM: CT ANGIOGRAPHY CHEST WITH CONTRAST TECHNIQUE: Multidetector CT imaging of the chest was performed using the standard protocol during bolus administration of intravenous contrast. Multiplanar CT image reconstructions and MIPs were obtained to evaluate the vascular anatomy. CONTRAST:  100 cc IV Isovue 370 COMPARISON:  Same day CXR FINDINGS: Cardiovascular: The study is of quality for the evaluation of pulmonary embolism. There are no filling defects in the central, lobar, segmental or subsegmental pulmonary artery branches to suggest acute pulmonary embolism. Great vessels are normal in course and caliber. Normal heart size. No significant pericardial fluid/thickening. Mediastinum/Nodes: No discrete thyroid nodules. Unremarkable esophagus. No pathologically enlarged axillary, mediastinal or hilar lymph nodes. Small subcentimeter paratracheal and tracheobronchial lymph nodes. Lungs/Pleura: No pneumothorax. No pleural effusion. Bilateral upper lobe predominant centrilobular emphysema. Nonspecific patchy ground-glass opacities are also seen in both upper lobes, right middle lobe and lingula. There is mild peribronchial thickening seen in both lower lobes. No effusion. There is atelectasis in the left lower lobe laterally and along the dependent aspect of both lower lobes. Small 6 mm subpleural right middle lobe lateral segment nodular density, series 9, image 65. Upper abdomen: There are up to 4 or more circumscribed left hepatic hypodensities statistically consistent with cysts or hemangiomata the largest measuring approximately 9 mm. The visualized gallbladder is unremarkable. Is no splenomegaly. No adrenal mass. Musculoskeletal: No aggressive  appearing focal osseous lesions. Bone islands of the right humeral head. Review of the MIP images confirms the above findings. IMPRESSION: No large central pulmonary embolus. Centrilobular emphysema upper lobe predominant with nonspecific bilateral upper lobe, right middle lobe and lingular ground-glass opacities which may reflect interstitial pneumonia, sequela of alveolar/pulmonary edema, pneumonitis/ alveolitis among some considerations though not exclusive. Bibasilar dependent atelectasis. Tiny 5 mm subpleural right middle lobe density may reflect a focus of atelectasis. As a small nodule is not entirely excluded, follow up per Fleischner Society recommendations. Non-contrast chest CT at 6-12 months is recommended. If the nodule is stable at time of repeat CT, then future CT at 18-24 months (  from today's scan) is considered optional for low-risk patients, but is recommended for high-risk patients. This recommendation follows the consensus statement: Guidelines for Management of Incidental Pulmonary Nodules Detected on CT Images: From the Fleischner Society 2017; Radiology 2017; 284:228-243. Electronically Signed   By: Ashley Royalty M.D.   On: 08/18/2016 18:14    EKG: Independently reviewed. Sinus rhythm with normal intervals.  Assessment/Plan: Principal Problem:   Acute respiratory failure with hypoxia (HCC) Active Problems:   Hypothyroidism (acquired)   Community acquired pneumonia   Pulmonary nodule   COPD with exacerbation ([place])    This patient was discussed with the ED physician, including pertinent vitals, physical exam findings, labs, and imaging.  We also discussed care given by the ED provider.  #1 acute respiratory failure with hypoxia  Admit  Oxygen therapy #2. Community Acquired pneumonia  Antibiotics: Azithromycin and ceftriaxone  Legionella and strep antigens by urine  Sputum culture  Blood cultures #3 COPD with exacerbation  Albuterol every 6 when necessary    Continue inhaled steroids and LA bronchodilator  Solu-Medrol 60 mg IV every 12 hours  Mucinex #4 pulmonary nodule  Will need repeat CT in 6-12 months #5 hypothyroidism  Continue medication  DVT prophylaxis: Lovenox Consultants: None Code Status: Full code Family Communication: None  Disposition Plan: Patient likely to return home following admission   Truett Mainland, DO [facility] Pager [phone]  If 7PM-7AM, please contact night-coverage [email or web address] Password [email or web address]

## 2016-08-19 LAB — CBC
HEMATOCRIT: 39.5 % (ref 36.0–46.0)
HEMOGLOBIN: 14 g/dL (ref 12.0–15.0)
MCH: 28.5 pg (ref 26.0–34.0)
MCHC: 35.4 g/dL (ref 30.0–36.0)
MCV: 80.3 fL (ref 78.0–100.0)
Platelets: 144 10*3/uL — ABNORMAL LOW (ref 150–400)
RBC: 4.92 MIL/uL (ref 3.87–5.11)
RDW: 13.9 % (ref 11.5–15.5)
WBC: 7.9 10*3/uL (ref 4.0–10.5)

## 2016-08-19 LAB — URINALYSIS, ROUTINE W REFLEX MICROSCOPIC
BILIRUBIN URINE: NEGATIVE
Glucose, UA: NEGATIVE mg/dL
KETONES UR: NEGATIVE mg/dL
Leukocytes, UA: NEGATIVE
NITRITE: NEGATIVE
PROTEIN: NEGATIVE mg/dL
Specific Gravity, Urine: 1.01 (ref 1.005–1.030)
pH: 6 (ref 5.0–8.0)

## 2016-08-19 LAB — URINE MICROSCOPIC-ADD ON

## 2016-08-19 LAB — STREP PNEUMONIAE URINARY ANTIGEN: STREP PNEUMO URINARY ANTIGEN: NEGATIVE

## 2016-08-19 MED ORDER — NICOTINE 21 MG/24HR TD PT24
21.0000 mg | MEDICATED_PATCH | Freq: Every day | TRANSDERMAL | Status: DC
  Administered 2016-08-19 – 2016-08-20 (×2): 21 mg via TRANSDERMAL
  Filled 2016-08-19 (×3): qty 1

## 2016-08-19 MED ORDER — POTASSIUM CHLORIDE CRYS ER 20 MEQ PO TBCR
40.0000 meq | EXTENDED_RELEASE_TABLET | Freq: Once | ORAL | Status: AC
  Administered 2016-08-19: 40 meq via ORAL
  Filled 2016-08-19: qty 2

## 2016-08-19 MED ORDER — ORAL CARE MOUTH RINSE
15.0000 mL | Freq: Two times a day (BID) | OROMUCOSAL | Status: DC
  Administered 2016-08-19 – 2016-08-20 (×2): 15 mL via OROMUCOSAL

## 2016-08-19 MED ORDER — GUAIFENESIN ER 600 MG PO TB12
1200.0000 mg | ORAL_TABLET | Freq: Two times a day (BID) | ORAL | Status: DC
  Administered 2016-08-19 – 2016-08-21 (×5): 1200 mg via ORAL
  Filled 2016-08-19 (×5): qty 2

## 2016-08-19 NOTE — Progress Notes (Signed)
Patient found to be smoking a cigarette in the room.  Informed pt of [facility] policy on not smoking on property and in hospital.  Pt requesting nicotene patch, paged Dr Hartford Poli and verbal order given for Nicotene patch.  Security notified and cigarettes confiscated and placed in pt belongings.

## 2016-08-19 NOTE — Progress Notes (Signed)
PROGRESS NOTE  Dorothy [name]  [id number] DOB: 29-Apr-1956 DOA: 08/18/2016 PCP: Renee Rival, NP Outpatient Specialists:  Subjective: Still has no appetite, some cough. She reported burning while passing urine, was recently diagnosed with UTI she thinks the UTI did not go away.  Brief Narrative:  Dorothy [name] is a 60 y.o. female with a history of ovarian and cervical cancer status post treatment, hypertension, hyperlipidemia, depression on antidepressants and anxiolytics. She started having productive cough with white sputum production and shortness of breath 3 days ago. She saw her primary care physician today who ordered an outpatient x-ray, which showed a right infiltrate and was read as probable pneumonia, but possibly right lung infarct. Patient was instructed to come here for further evaluation. Her shortness of breath is worse with ambulation and improved with rest. Her symptoms are worsening. She denies current fevers or chills.  Assessment & Plan:   Principal Problem:   Acute respiratory failure with hypoxia (HCC) Active Problems:   Hypothyroidism (acquired)   Community acquired pneumonia   Pulmonary nodule   COPD with exacerbation ([place])   Acute respiratory failure with hypoxia -Patient is hypoxic with oxygen saturation down to 87% on room air. -Has prolonged history of tobacco abuse, CT scan shows centrilobular emphysema. -Has wheezing and x-ray shows probable pneumonia. -Provide oxygen treat COPD exacerbation and pneumonia.  Community Acquired pneumonia -Presented with cough, shortness of breath, CT scan showed no evidence of PE but probable pneumonia. -Started on antibiotics. -Continue supportive management with bronchodilators, mucolytics, antitussives and oxygen as needed.  COPD with exacerbation -Presentation as above, started on IV antibiotics and IV steroids. -Supportive management as mentioned above.  UTI -Recently treated for UTI, patient  thinks she still has LUT symptoms. -Patient is on Rocephin already for her pneumonia, continue obtain UA/culture.  Pulmonary nodule -Will need repeat CT in 6-12 months  Hypothyroidism -Continue medication   DVT prophylaxis: lovenox Code Status: Full Code Family Communication:  Disposition Plan:  Diet: Diet Heart Room service appropriate? Yes; Fluid consistency: Thin  Consultants:   None  Procedures:   None  Antimicrobials:   Rocephin and azithromycin  Objective: Vitals:   08/18/16 2054 08/19/16 0419 08/19/16 0857 08/19/16 0903  BP: 116/70 (!) 95/51    Pulse: 85 65    Resp: 15 17    Temp: 98.3 F (36.8 C) 97.6 F (36.4 C)    TempSrc: Oral Oral    SpO2: 97% 97% (!) 87% (!) 87%  Weight: 67.5 kg (148 lb 12.8 oz)     Height:        Intake/Output Summary (Last 24 hours) at 08/19/16 1048 Last data filed at 08/19/16 0300  Gross per 24 hour  Intake              250 ml  Output                0 ml  Net              250 ml   Filed Weights   08/18/16 1612 08/18/16 2054  Weight: 65.3 kg (144 lb) 67.5 kg (148 lb 12.8 oz)    Examination: General exam: Appears calm and comfortable  Respiratory system: Clear to auscultation. Respiratory effort normal. Cardiovascular system: S1 & S2 heard, RRR. No JVD, murmurs, rubs, gallops or clicks. No pedal edema. Gastrointestinal system: Abdomen is nondistended, soft and nontender. No organomegaly or masses felt. Normal bowel sounds heard. Central nervous system: Alert and oriented. No focal neurological  deficits. Extremities: Symmetric 5 x 5 power. Skin: No rashes, lesions or ulcers Psychiatry: Judgement and insight appear normal. Mood & affect appropriate.   Data Reviewed: I have personally reviewed following labs and imaging studies  CBC:  Recent Labs Lab 08/18/16 1626  WBC 9.1  NEUTROABS 8.1*  HGB 16.4*  HCT 45.3  MCV 79.9  PLT [id number]*   Basic Metabolic Panel:  Recent Labs Lab 08/18/16 1626  NA 133*  K 3.6  CL  99*  CO2 24  GLUCOSE 154*  BUN 15  CREATININE 0.87  CALCIUM 9.3   GFR: Estimated Creatinine Clearance: 69 mL/min (by C-G formula based on SCr of 0.87 mg/dL). Liver Function Tests: No results for input(s): AST, ALT, ALKPHOS, BILITOT, PROT, ALBUMIN in the last 168 hours. No results for input(s): LIPASE, AMYLASE in the last 168 hours. No results for input(s): AMMONIA in the last 168 hours. Coagulation Profile: No results for input(s): INR, PROTIME in the last 168 hours. Cardiac Enzymes: No results for input(s): CKTOTAL, CKMB, CKMBINDEX, TROPONINI in the last 168 hours. BNP (last 3 results) No results for input(s): PROBNP in the last 8760 hours. HbA1C: No results for input(s): HGBA1C in the last 72 hours. CBG: No results for input(s): GLUCAP in the last 168 hours. Lipid Profile: No results for input(s): CHOL, HDL, LDLCALC, TRIG, CHOLHDL, LDLDIRECT in the last 72 hours. Thyroid Function Tests: No results for input(s): TSH, T4TOTAL, FREET4, T3FREE, THYROIDAB in the last 72 hours. Anemia Panel: No results for input(s): VITAMINB12, FOLATE, FERRITIN, TIBC, IRON, RETICCTPCT in the last 72 hours. Urine analysis:    Component Value Date/Time   COLORURINE YELLOW 09/13/2012 1028   APPEARANCEUR CLOUDY (A) 09/13/2012 1028   LABSPEC 1.019 09/13/2012 1028   PHURINE 6.5 09/13/2012 1028   GLUCOSEU NEGATIVE 09/13/2012 1028   HGBUR NEGATIVE 09/13/2012 1028   BILIRUBINUR n 02/17/2016 1312   KETONESUR NEGATIVE 09/13/2012 1028   PROTEINUR n 02/17/2016 1312   PROTEINUR NEGATIVE 09/13/2012 1028   UROBILINOGEN negative 02/17/2016 1312   UROBILINOGEN 0.2 09/13/2012 1028   NITRITE n 02/17/2016 1312   NITRITE NEGATIVE 09/13/2012 1028   LEUKOCYTESUR Negative 02/17/2016 1312   Sepsis Labs: @LABRCNTIP (procalcitonin:4,lacticidven:4)  ) Recent Results (from the past 240 hour(s))  Culture, blood (routine x 2) Call MD if unable to obtain prior to antibiotics being given     Status: None (Preliminary  result)   Collection Time: 08/18/16  8:11 PM  Result Value Ref Range Status   Specimen Description BLOOD LEFT HAND  Final   Special Requests BOTTLES DRAWN AEROBIC AND ANAEROBIC 6CC  Final   Culture NO GROWTH < 12 HOURS  Final   Report Status PENDING  Incomplete  Culture, blood (routine x 2) Call MD if unable to obtain prior to antibiotics being given     Status: None (Preliminary result)   Collection Time: 08/18/16  8:19 PM  Result Value Ref Range Status   Specimen Description BLOOD LEFT ARM  Final   Special Requests BOTTLES DRAWN AEROBIC AND ANAEROBIC 6CC  Final   Culture NO GROWTH < 12 HOURS  Final   Report Status PENDING  Incomplete     Invalid input(s): PROCALCITONIN, LACTICACIDVEN   Radiology Studies: Dg Chest 2 View  Result Date: 08/18/2016 CLINICAL DATA:  Productive cough. EXAM: CHEST  2 VIEW COMPARISON:  04/27/2014. FINDINGS: Mediastinum and hilar structures normal. Heart size normal. Mild infiltrate noted in the peripheral left lung base. Small adjacent pleural effusion. Given the peripheral nature of the of mild  infiltrate possibly pulmonary infarct cannot be entirely excluded. Degenerative changes thoracic spine IMPRESSION: Mild infiltrate peripheral left lung base. This is most likely a small focus of pneumonia. Tiny associated left pleural effusion noted. Given the peripheral nature of this small infiltrate the possibly the pulmonary infarct cannot be completely excluded. Electronically Signed   By: [place]   On: 08/18/2016 14:12   Ct Head W & Wo Contrast  Result Date: 08/18/2016 CLINICAL DATA:  Frontal headache for 3 days.  Congestion. EXAM: CT HEAD WITHOUT AND WITH CONTRAST TECHNIQUE: Contiguous axial images were obtained from the base of the skull through the vertex without and with intravenous contrast CONTRAST:  45mL ISOVUE-300 IOPAMIDOL (ISOVUE-300) INJECTION 61%<Contrast>55mL ISOVUE-300 IOPAMIDOL (ISOVUE-300) INJECTION 61% COMPARISON:  None. FINDINGS: Brain:  No acute infarct, hemorrhage, or mass lesion is present. The ventricles are of normal size. No significant extraaxial fluid collection is present. No significant white matter disease is present. The postcontrast images demonstrate no pathologic enhancement. Vascular: Atherosclerotic calcifications are present within the cavernous internal carotid arteries bilaterally. There is no significant hyperdense vessel. Skull: Calvarium is intact. No focal lytic or blastic lesions are present. Sinuses/Orbits: A single posterior left ethmoid air cell is opacified. The remaining paranasal sinuses and the mastoid air cells are clear. IMPRESSION: Negative CT of the head without and with contrast. Electronically Signed   By: San Morelle M.D.   On: 08/18/2016 13:32   Ct Angio Chest Pe W Or Wo Contrast  Result Date: 08/18/2016 CLINICAL DATA:  Dyspnea on exertion, cough for 4 days of hypoxia EXAM: CT ANGIOGRAPHY CHEST WITH CONTRAST TECHNIQUE: Multidetector CT imaging of the chest was performed using the standard protocol during bolus administration of intravenous contrast. Multiplanar CT image reconstructions and MIPs were obtained to evaluate the vascular anatomy. CONTRAST:  100 cc IV Isovue 370 COMPARISON:  Same day CXR FINDINGS: Cardiovascular: The study is of quality for the evaluation of pulmonary embolism. There are no filling defects in the central, lobar, segmental or subsegmental pulmonary artery branches to suggest acute pulmonary embolism. Great vessels are normal in course and caliber. Normal heart size. No significant pericardial fluid/thickening. Mediastinum/Nodes: No discrete thyroid nodules. Unremarkable esophagus. No pathologically enlarged axillary, mediastinal or hilar lymph nodes. Small subcentimeter paratracheal and tracheobronchial lymph nodes. Lungs/Pleura: No pneumothorax. No pleural effusion. Bilateral upper lobe predominant centrilobular emphysema. Nonspecific patchy ground-glass opacities are  also seen in both upper lobes, right middle lobe and lingula. There is mild peribronchial thickening seen in both lower lobes. No effusion. There is atelectasis in the left lower lobe laterally and along the dependent aspect of both lower lobes. Small 6 mm subpleural right middle lobe lateral segment nodular density, series 9, image 65. Upper abdomen: There are up to 4 or more circumscribed left hepatic hypodensities statistically consistent with cysts or hemangiomata the largest measuring approximately 9 mm. The visualized gallbladder is unremarkable. Is no splenomegaly. No adrenal mass. Musculoskeletal: No aggressive appearing focal osseous lesions. Bone islands of the right humeral head. Review of the MIP images confirms the above findings. IMPRESSION: No large central pulmonary embolus. Centrilobular emphysema upper lobe predominant with nonspecific bilateral upper lobe, right middle lobe and lingular ground-glass opacities which may reflect interstitial pneumonia, sequela of alveolar/pulmonary edema, pneumonitis/ alveolitis among some considerations though not exclusive. Bibasilar dependent atelectasis. Tiny 5 mm subpleural right middle lobe density may reflect a focus of atelectasis. As a small nodule is not entirely excluded, follow up per Fleischner Society recommendations. Non-contrast chest CT at 6-12 months  is recommended. If the nodule is stable at time of repeat CT, then future CT at 18-24 months (from today's scan) is considered optional for low-risk patients, but is recommended for high-risk patients. This recommendation follows the consensus statement: Guidelines for Management of Incidental Pulmonary Nodules Detected on CT Images: From the Fleischner Society 2017; Radiology 2017; 284:228-243. Electronically Signed   By: Ashley Royalty M.D.   On: 08/18/2016 18:14        Scheduled Meds: . ARIPiprazole  5 mg Oral QHS  . azithromycin  500 mg Intravenous Q24H  . cefTRIAXone (ROCEPHIN)  IV  1 g  Intravenous Q24H  . clonazePAM  1 mg Oral QHS  . enoxaparin (LOVENOX) injection  40 mg Subcutaneous Q24H  . guaiFENesin  1,200 mg Oral BID  . hydrochlorothiazide  12.5 mg Oral Daily  . levothyroxine  112 mcg Oral QAC breakfast  . mouth rinse  15 mL Mouth Rinse BID  . methylPREDNISolone (SOLU-MEDROL) injection  60 mg Intravenous Q12H  . mometasone-formoterol  2 puff Inhalation BID  . venlafaxine XR  300 mg Oral Q breakfast  . zolpidem  5 mg Oral QHS   Continuous Infusions:    LOS: 1 day    Time spent: 35 minutes    Geordie Nooney A, MD [facility] Pager [phone]  If 7PM-7AM, please contact night-coverage [email or web address] Password [email or web address] 08/19/2016, 10:48 AM

## 2016-08-20 LAB — BASIC METABOLIC PANEL
Anion gap: 6 (ref 5–15)
BUN: 13 mg/dL (ref 6–20)
CALCIUM: 8.8 mg/dL — AB (ref 8.9–10.3)
CO2: 26 mmol/L (ref 22–32)
CREATININE: 0.61 mg/dL (ref 0.44–1.00)
Chloride: 107 mmol/L (ref 101–111)
GFR calc Af Amer: >60 mL/min (ref >60)
GLUCOSE: 108 mg/dL — AB (ref 65–99)
Potassium: 4.1 mmol/L (ref 3.5–5.1)
Sodium: 139 mmol/L (ref 135–145)

## 2016-08-20 LAB — CBC
HCT: 40.3 % (ref 36.0–46.0)
Hemoglobin: 14.1 g/dL (ref 12.0–15.0)
MCH: 28.3 pg (ref 26.0–34.0)
MCHC: 35 g/dL (ref 30.0–36.0)
MCV: 80.9 fL (ref 78.0–100.0)
Platelets: 178 10*3/uL (ref 150–400)
RBC: 4.98 MIL/uL (ref 3.87–5.11)
RDW: 14.1 % (ref 11.5–15.5)
WBC: 17.7 10*3/uL — ABNORMAL HIGH (ref 4.0–10.5)

## 2016-08-20 LAB — HIV ANTIBODY (ROUTINE TESTING W REFLEX): HIV Screen 4th Generation wRfx: NONREACTIVE

## 2016-08-20 MED ORDER — DEXTROSE 5 % IV SOLN
INTRAVENOUS | Status: AC
  Filled 2016-08-20: qty 10

## 2016-08-20 MED ORDER — DEXTROSE 5 % IV SOLN
INTRAVENOUS | Status: AC
  Filled 2016-08-20: qty 500

## 2016-08-20 NOTE — Progress Notes (Signed)
PROGRESS NOTE  [name]  [id number] DOB: 1956/05/10 DOA: 08/18/2016 PCP: Renee Rival, NP Outpatient Specialists:  Subjective: Patient still has cough with minimal sputum production Got emotional and tearful when I talked to her about smoking cessation "it's hard"  Brief Narrative:  Dorothy [name] is a 60 y.o. female with a history of ovarian and cervical cancer status post treatment, hypertension, hyperlipidemia, depression on antidepressants and anxiolytics. She started having productive cough with white sputum production and shortness of breath 3 days ago. She saw her primary care physician today who ordered an outpatient x-ray, which showed a right infiltrate and was read as probable pneumonia, but possibly right lung infarct. Patient was instructed to come here for further evaluation. Her shortness of breath is worse with ambulation and improved with rest. Her symptoms are worsening. She denies current fevers or chills.  Assessment & Plan:   Principal Problem:   Acute respiratory failure with hypoxia (HCC) Active Problems:   Hypothyroidism (acquired)   Community acquired pneumonia   Pulmonary nodule   COPD with exacerbation ([place])   Acute respiratory failure with hypoxia -Patient is hypoxic with oxygen saturation down to 87% on room air. -Has prolonged history of tobacco abuse, CT scan shows centrilobular emphysema. -Has wheezing and x-ray shows probable pneumonia. -Provide oxygen treat COPD exacerbation and pneumonia.  Community Acquired pneumonia -Presented with cough, shortness of breath, CT scan showed no evidence of PE but probable pneumonia. -Started on antibiotics. -Continue supportive management with bronchodilators, mucolytics, antitussives and oxygen as needed.  COPD with exacerbation -Presentation as above, started on IV antibiotics and IV steroids. -Continue current respiratory management.  UTI -Recently treated for UTI, patient thinks she  still has LUT symptoms. -Patient is on Rocephin already for her pneumonia, continue obtain UA/culture.  Pulmonary nodule -Will need repeat CT in 6-12 months  Hypothyroidism -Continue medication  Tobacco abuse -Patient still smokes cigarettes, she was smoking in her patient's roommate yesterday. -Was very emotional and tearful when I talked her about tobacco cessation.   DVT prophylaxis: lovenox Code Status: Full Code Family Communication:  Disposition Plan:  Diet: Diet Heart Room service appropriate? Yes; Fluid consistency: Thin  Consultants:   None  Procedures:   None  Antimicrobials:   Rocephin and azithromycin  Objective: Vitals:   08/19/16 1941 08/19/16 2216 08/20/16 0545 08/20/16 0906  BP:  111/62 (!) 129/55   Pulse:  63 (!) 57   Resp:  16 16   Temp:  97.2 F (36.2 C) 97.6 F (36.4 C)   TempSrc:  Oral Oral   SpO2: 96% 100% 97% 92%  Weight:      Height:        Intake/Output Summary (Last 24 hours) at 08/20/16 1036 Last data filed at 08/20/16 0859  Gross per 24 hour  Intake              780 ml  Output                1 ml  Net              779 ml   Filed Weights   08/18/16 1612 08/18/16 2054  Weight: 65.3 kg (144 lb) 67.5 kg (148 lb 12.8 oz)    Examination: General exam: Appears calm and comfortable  Respiratory system: Clear to auscultation. Respiratory effort normal. Cardiovascular system: S1 & S2 heard, RRR. No JVD, murmurs, rubs, gallops or clicks. No pedal edema. Gastrointestinal system: Abdomen is nondistended, soft and nontender. No  organomegaly or masses felt. Normal bowel sounds heard. Central nervous system: Alert and oriented. No focal neurological deficits. Extremities: Symmetric 5 x 5 power. Skin: No rashes, lesions or ulcers Psychiatry: Judgement and insight appear normal. Mood & affect appropriate.   Data Reviewed: I have personally reviewed following labs and imaging studies  CBC:  Recent Labs Lab 08/18/16 1626  08/19/16 0559 08/20/16 0601  WBC 9.1 7.9 17.7*  NEUTROABS 8.1*  --   --   HGB 16.4* 14.0 14.1  HCT 45.3 39.5 40.3  MCV 79.9 80.3 80.9  PLT 133* 144* 0000000   Basic Metabolic Panel:  Recent Labs Lab 08/18/16 1626 08/20/16 0601  NA 133* 139  K 3.6 4.1  CL 99* 107  CO2 24 26  GLUCOSE 154* 108*  BUN 15 13  CREATININE 0.87 0.61  CALCIUM 9.3 8.8*   GFR: Estimated Creatinine Clearance: 75.1 mL/min (by C-G formula based on SCr of 0.61 mg/dL). Liver Function Tests: No results for input(s): AST, ALT, ALKPHOS, BILITOT, PROT, ALBUMIN in the last 168 hours. No results for input(s): LIPASE, AMYLASE in the last 168 hours. No results for input(s): AMMONIA in the last 168 hours. Coagulation Profile: No results for input(s): INR, PROTIME in the last 168 hours. Cardiac Enzymes: No results for input(s): CKTOTAL, CKMB, CKMBINDEX, TROPONINI in the last 168 hours. BNP (last 3 results) No results for input(s): PROBNP in the last 8760 hours. HbA1C: No results for input(s): HGBA1C in the last 72 hours. CBG: No results for input(s): GLUCAP in the last 168 hours. Lipid Profile: No results for input(s): CHOL, HDL, LDLCALC, TRIG, CHOLHDL, LDLDIRECT in the last 72 hours. Thyroid Function Tests: No results for input(s): TSH, T4TOTAL, FREET4, T3FREE, THYROIDAB in the last 72 hours. Anemia Panel: No results for input(s): VITAMINB12, FOLATE, FERRITIN, TIBC, IRON, RETICCTPCT in the last 72 hours. Urine analysis:    Component Value Date/Time   COLORURINE YELLOW 08/19/2016 [place] 08/19/2016 1236   LABSPEC 1.010 08/19/2016 1236   PHURINE 6.0 08/19/2016 1236   GLUCOSEU NEGATIVE 08/19/2016 1236   HGBUR SMALL (A) 08/19/2016 1236   BILIRUBINUR NEGATIVE 08/19/2016 1236   BILIRUBINUR n 02/17/2016 1312   KETONESUR NEGATIVE 08/19/2016 1236   PROTEINUR NEGATIVE 08/19/2016 1236   UROBILINOGEN negative 02/17/2016 1312   UROBILINOGEN 0.2 09/13/2012 1028   NITRITE NEGATIVE 08/19/2016 1236    LEUKOCYTESUR NEGATIVE 08/19/2016 1236   Sepsis Labs: @LABRCNTIP (procalcitonin:4,lacticidven:4)  ) Recent Results (from the past 240 hour(s))  Culture, blood (routine x 2) Call MD if unable to obtain prior to antibiotics being given     Status: None (Preliminary result)   Collection Time: 08/18/16  8:11 PM  Result Value Ref Range Status   Specimen Description BLOOD LEFT HAND  Final   Special Requests BOTTLES DRAWN AEROBIC AND ANAEROBIC 6CC  Final   Culture NO GROWTH 2 DAYS  Final   Report Status PENDING  Incomplete  Culture, blood (routine x 2) Call MD if unable to obtain prior to antibiotics being given     Status: None (Preliminary result)   Collection Time: 08/18/16  8:19 PM  Result Value Ref Range Status   Specimen Description BLOOD LEFT ARM  Final   Special Requests BOTTLES DRAWN AEROBIC AND ANAEROBIC 6CC  Final   Culture NO GROWTH 2 DAYS  Final   Report Status PENDING  Incomplete  Urine culture     Status: None (Preliminary result)   Collection Time: 08/19/16 12:36 PM  Result Value Ref Range Status  Specimen Description URINE, CLEAN CATCH  Final   Special Requests NONE  Final   Culture PENDING  Incomplete   Report Status PENDING  Incomplete     Invalid input(s): PROCALCITONIN, LACTICACIDVEN   Radiology Studies: Dg Chest 2 View  Result Date: 08/18/2016 CLINICAL DATA:  Productive cough. EXAM: CHEST  2 VIEW COMPARISON:  04/27/2014. FINDINGS: Mediastinum and hilar structures normal. Heart size normal. Mild infiltrate noted in the peripheral left lung base. Small adjacent pleural effusion. Given the peripheral nature of the of mild infiltrate possibly pulmonary infarct cannot be entirely excluded. Degenerative changes thoracic spine IMPRESSION: Mild infiltrate peripheral left lung base. This is most likely a small focus of pneumonia. Tiny associated left pleural effusion noted. Given the peripheral nature of this small infiltrate the possibly the pulmonary infarct cannot be  completely excluded. Electronically Signed   By: [place]   On: 08/18/2016 14:12   Ct Head W & Wo Contrast  Result Date: 08/18/2016 CLINICAL DATA:  Frontal headache for 3 days.  Congestion. EXAM: CT HEAD WITHOUT AND WITH CONTRAST TECHNIQUE: Contiguous axial images were obtained from the base of the skull through the vertex without and with intravenous contrast CONTRAST:  36mL ISOVUE-300 IOPAMIDOL (ISOVUE-300) INJECTION 61%<Contrast>57mL ISOVUE-300 IOPAMIDOL (ISOVUE-300) INJECTION 61% COMPARISON:  None. FINDINGS: Brain: No acute infarct, hemorrhage, or mass lesion is present. The ventricles are of normal size. No significant extraaxial fluid collection is present. No significant white matter disease is present. The postcontrast images demonstrate no pathologic enhancement. Vascular: Atherosclerotic calcifications are present within the cavernous internal carotid arteries bilaterally. There is no significant hyperdense vessel. Skull: Calvarium is intact. No focal lytic or blastic lesions are present. Sinuses/Orbits: A single posterior left ethmoid air cell is opacified. The remaining paranasal sinuses and the mastoid air cells are clear. IMPRESSION: Negative CT of the head without and with contrast. Electronically Signed   By: San Morelle M.D.   On: 08/18/2016 13:32   Ct Angio Chest Pe W Or Wo Contrast  Result Date: 08/18/2016 CLINICAL DATA:  Dyspnea on exertion, cough for 4 days of hypoxia EXAM: CT ANGIOGRAPHY CHEST WITH CONTRAST TECHNIQUE: Multidetector CT imaging of the chest was performed using the standard protocol during bolus administration of intravenous contrast. Multiplanar CT image reconstructions and MIPs were obtained to evaluate the vascular anatomy. CONTRAST:  100 cc IV Isovue 370 COMPARISON:  Same day CXR FINDINGS: Cardiovascular: The study is of quality for the evaluation of pulmonary embolism. There are no filling defects in the central, lobar, segmental or subsegmental  pulmonary artery branches to suggest acute pulmonary embolism. Great vessels are normal in course and caliber. Normal heart size. No significant pericardial fluid/thickening. Mediastinum/Nodes: No discrete thyroid nodules. Unremarkable esophagus. No pathologically enlarged axillary, mediastinal or hilar lymph nodes. Small subcentimeter paratracheal and tracheobronchial lymph nodes. Lungs/Pleura: No pneumothorax. No pleural effusion. Bilateral upper lobe predominant centrilobular emphysema. Nonspecific patchy ground-glass opacities are also seen in both upper lobes, right middle lobe and lingula. There is mild peribronchial thickening seen in both lower lobes. No effusion. There is atelectasis in the left lower lobe laterally and along the dependent aspect of both lower lobes. Small 6 mm subpleural right middle lobe lateral segment nodular density, series 9, image 65. Upper abdomen: There are up to 4 or more circumscribed left hepatic hypodensities statistically consistent with cysts or hemangiomata the largest measuring approximately 9 mm. The visualized gallbladder is unremarkable. Is no splenomegaly. No adrenal mass. Musculoskeletal: No aggressive appearing focal osseous lesions. Bone islands of  the right humeral head. Review of the MIP images confirms the above findings. IMPRESSION: No large central pulmonary embolus. Centrilobular emphysema upper lobe predominant with nonspecific bilateral upper lobe, right middle lobe and lingular ground-glass opacities which may reflect interstitial pneumonia, sequela of alveolar/pulmonary edema, pneumonitis/ alveolitis among some considerations though not exclusive. Bibasilar dependent atelectasis. Tiny 5 mm subpleural right middle lobe density may reflect a focus of atelectasis. As a small nodule is not entirely excluded, follow up per Fleischner Society recommendations. Non-contrast chest CT at 6-12 months is recommended. If the nodule is stable at time of repeat CT, then  future CT at 18-24 months (from today's scan) is considered optional for low-risk patients, but is recommended for high-risk patients. This recommendation follows the consensus statement: Guidelines for Management of Incidental Pulmonary Nodules Detected on CT Images: From the Fleischner Society 2017; Radiology 2017; 284:228-243. Electronically Signed   By: Ashley Royalty M.D.   On: 08/18/2016 18:14        Scheduled Meds: . ARIPiprazole  5 mg Oral QHS  . azithromycin  500 mg Intravenous Q24H  . cefTRIAXone (ROCEPHIN)  IV  1 g Intravenous Q24H  . clonazePAM  1 mg Oral QHS  . enoxaparin (LOVENOX) injection  40 mg Subcutaneous Q24H  . guaiFENesin  1,200 mg Oral BID  . hydrochlorothiazide  12.5 mg Oral Daily  . levothyroxine  112 mcg Oral QAC breakfast  . mouth rinse  15 mL Mouth Rinse BID  . methylPREDNISolone (SOLU-MEDROL) injection  60 mg Intravenous Q12H  . mometasone-formoterol  2 puff Inhalation BID  . nicotine  21 mg Transdermal Daily  . venlafaxine XR  300 mg Oral Q breakfast  . zolpidem  5 mg Oral QHS   Continuous Infusions:    LOS: 2 days    Time spent: 35 minutes    Bryli Mantey A, MD [facility] Pager [phone]  If 7PM-7AM, please contact night-coverage [email or web address] Password [email or web address] 08/20/2016, 10:36 AM

## 2016-08-21 LAB — BASIC METABOLIC PANEL
Anion gap: 7 (ref 5–15)
BUN: 16 mg/dL (ref 6–20)
CHLORIDE: 107 mmol/L (ref 101–111)
CO2: 26 mmol/L (ref 22–32)
Calcium: 8.6 mg/dL — ABNORMAL LOW (ref 8.9–10.3)
Creatinine, Ser: 0.64 mg/dL (ref 0.44–1.00)
GFR calc Af Amer: >60 mL/min (ref >60)
GFR calc non Af Amer: >60 mL/min (ref >60)
Glucose, Bld: 108 mg/dL — ABNORMAL HIGH (ref 65–99)
POTASSIUM: 4.2 mmol/L (ref 3.5–5.1)
SODIUM: 140 mmol/L (ref 135–145)

## 2016-08-21 LAB — URINE CULTURE

## 2016-08-21 MED ORDER — GUAIFENESIN ER 600 MG PO TB12: 1200.0000 mg | ORAL_TABLET | Freq: Two times a day (BID) | ORAL | 0 refills | Status: DC

## 2016-08-21 MED ORDER — LEVOFLOXACIN 750 MG PO TABS: 750.0000 mg | ORAL_TABLET | Freq: Every day | ORAL | 0 refills | Status: DC

## 2016-08-21 MED ORDER — ALBUTEROL SULFATE (2.5 MG/3ML) 0.083% IN NEBU: 2.5000 mg | INHALATION_SOLUTION | RESPIRATORY_TRACT | 12 refills | Status: DC | PRN

## 2016-08-21 MED ORDER — PREDNISONE 10 MG PO TABS: ORAL_TABLET | ORAL | 0 refills | Status: DC

## 2016-08-21 NOTE — Discharge Summary (Signed)
Physician Discharge Summary  Dorothy [name] [id number] DOB: Dec 20, 1955 DOA: 08/18/2016  PCP: Dorothy Rival, NP  Admit date: 08/18/2016 Discharge date: 08/21/2016  Admitted From: Home Disposition: Home  Recommendations for Outpatient Follow-up:  1. Follow up with PCP in 1-2 weeks 2. Please obtain BMP/CBC in one week  Home Health: NA Equipment/Devices:NA  Discharge Condition: Stable CODE STATUS: Full Code Diet recommendation: Diet Heart Room service appropriate? Yes; Fluid consistency: Thin Diet - low sodium heart healthy  Brief/Interim Summary: Dorothy Beel Wilsonis a 60 y.o.femalewith a history of ovarian and cervical cancer status post treatment, hypertension, hyperlipidemia, depression on antidepressants and anxiolytics. She started having productive cough with white sputum production and shortness of breath 3 days ago. She saw her primary care physician today who ordered an outpatient x-ray, which showed a right infiltrate and was read as probable pneumonia, but possibly right lung infarct. Patient was instructed to come here for further evaluation. Her shortness of breath is worse with ambulation and improved with rest. Her symptoms are worsening. She denies current fevers or chills.  Discharge Diagnoses:  Principal Problem:   Acute respiratory failure with hypoxia ([place]) Active Problems:   Hypothyroidism (acquired)   Community acquired pneumonia   Pulmonary nodule   COPD with exacerbation ([place])   Acute respiratory failure with hypoxia -Patient is hypoxic with oxygen saturation down to 87% on room air. -Has prolonged history of tobacco abuse, CT scan shows centrilobular emphysema. -Has wheezing and x-ray shows probable pneumonia. -This is resolved prior to discharge, sats in the mid 90s on room air.  Community Acquired pneumonia -Presented with cough, shortness of breath, CT scan showed no evidence of PE but probable pneumonia. -Started on  antibiotics. -Continue supportive management with bronchodilators, mucolytics, antitussives and oxygen as needed. -Discharged home on levofloxacin for 5 more days, prednisone taper and Mucinex.  COPD with exacerbation -Presentation as above, started on IV antibiotics and IV steroids. -Requested refill for her nebulizer albuterol. -Discharged home on levofloxacin for 5 more days, prednisone taper and Mucinex.  UTI -Recently treated for UTI, patient thinks she still has LUT symptoms. -Received Rocephin already in the hospital for her pneumonia, urine culture did not grow significant: Ms.  Pulmonary nodule -Will need repeat CT in 6-45months, discussed with the patient.  Hypothyroidism -Continuemedication  Tobacco abuse -Patient still smokes cigarettes, she was smoking in her patient's roommate yesterday. -Was very emotional and tearful when I talked her about tobacco cessation.  Discharge Instructions  Discharge Instructions    Diet - low sodium heart healthy    Complete by:  As directed    Increase activity slowly    Complete by:  As directed        Medication List    STOP taking these medications   nitrofurantoin (macrocrystal-monohydrate) 100 MG capsule Commonly known as:  MACROBID     TAKE these medications   ADVAIR DISKUS 250-50 MCG/DOSE Aepb Generic drug:  Fluticasone-Salmeterol Inhale 1 puff into the lungs daily as needed (for shortness of breath).   albuterol 108 (90 Base) MCG/ACT inhaler Commonly known as:  PROVENTIL HFA;VENTOLIN HFA Inhale 1-2 puffs into the lungs every 6 (six) hours as needed for wheezing or shortness of breath. What changed:  Another medication with the same name was added. Make sure you understand how and when to take each.   albuterol (2.5 MG/3ML) 0.083% nebulizer solution Commonly known as:  PROVENTIL Take 3 mLs (2.5 mg total) by nebulization every 4 (four) hours as needed for wheezing or  shortness of breath. What changed:  You  were already taking a medication with the same name, and this prescription was added. Make sure you understand how and when to take each.   ARIPiprazole 5 MG tablet Commonly known as:  ABILIFY Take 5 mg by mouth at bedtime.   clonazePAM 0.5 MG tablet Commonly known as:  KLONOPIN Take 1 mg by mouth at bedtime.   guaiFENesin 600 MG 12 hr tablet Commonly known as:  MUCINEX Take 2 tablets (1,200 mg total) by mouth 2 (two) times daily.   hydrochlorothiazide 25 MG tablet Commonly known as:  HYDRODIURIL Take 12.5 mg by mouth daily.   levofloxacin 750 MG tablet Commonly known as:  LEVAQUIN Take 1 tablet (750 mg total) by mouth daily. 10 day course starting on 08/18/2016 What changed:  medication strength  how much to take   levothyroxine 112 MCG tablet Commonly known as:  SYNTHROID, LEVOTHROID Take 112 mcg by mouth daily.   multivitamins ther. w/minerals Tabs tablet Take 1 tablet by mouth daily.   predniSONE 10 MG tablet Commonly known as:  DELTASONE Take 6-5-4-3-2-1 PO daily till gone What changed:  medication strength  how much to take  how to take this  when to take this  additional instructions   simvastatin 20 MG tablet Commonly known as:  ZOCOR Take 1 tablet by mouth daily.   venlafaxine XR 150 MG 24 hr capsule Commonly known as:  EFFEXOR-XR Take 300 mg by mouth daily with breakfast.   zolpidem 5 MG tablet Commonly known as:  AMBIEN Take 5 mg by mouth at bedtime.       Allergies  Allergen Reactions  . Sulfa Antibiotics Rash    Consultations:  None   Procedures (Echo, Carotid, EGD, Colonoscopy, ERCP)   Radiological studies: Dg Chest 2 View  Result Date: 08/18/2016 CLINICAL DATA:  Productive cough. EXAM: CHEST  2 VIEW COMPARISON:  04/27/2014. FINDINGS: Mediastinum and hilar structures normal. Heart size normal. Mild infiltrate noted in the peripheral left lung base. Small adjacent pleural effusion. Given the peripheral nature of the of mild  infiltrate possibly pulmonary infarct cannot be entirely excluded. Degenerative changes thoracic spine IMPRESSION: Mild infiltrate peripheral left lung base. This is most likely a small focus of pneumonia. Tiny associated left pleural effusion noted. Given the peripheral nature of this small infiltrate the possibly the pulmonary infarct cannot be completely excluded. Electronically Signed   By: [place]   On: 08/18/2016 14:12   Ct Head W & Wo Contrast  Result Date: 08/18/2016 CLINICAL DATA:  Frontal headache for 3 days.  Congestion. EXAM: CT HEAD WITHOUT AND WITH CONTRAST TECHNIQUE: Contiguous axial images were obtained from the base of the skull through the vertex without and with intravenous contrast CONTRAST:  24mL ISOVUE-300 IOPAMIDOL (ISOVUE-300) INJECTION 61%<Contrast>82mL ISOVUE-300 IOPAMIDOL (ISOVUE-300) INJECTION 61% COMPARISON:  None. FINDINGS: Brain: No acute infarct, hemorrhage, or mass lesion is present. The ventricles are of normal size. No significant extraaxial fluid collection is present. No significant white matter disease is present. The postcontrast images demonstrate no pathologic enhancement. Vascular: Atherosclerotic calcifications are present within the cavernous internal carotid arteries bilaterally. There is no significant hyperdense vessel. Skull: Calvarium is intact. No focal lytic or blastic lesions are present. Sinuses/Orbits: A single posterior left ethmoid air cell is opacified. The remaining paranasal sinuses and the mastoid air cells are clear. IMPRESSION: Negative CT of the head without and with contrast. Electronically Signed   By: San Morelle M.D.   On: 08/18/2016  13:32   Ct Angio Chest Pe W Or Wo Contrast  Result Date: 08/18/2016 CLINICAL DATA:  Dyspnea on exertion, cough for 4 days of hypoxia EXAM: CT ANGIOGRAPHY CHEST WITH CONTRAST TECHNIQUE: Multidetector CT imaging of the chest was performed using the standard protocol during bolus administration  of intravenous contrast. Multiplanar CT image reconstructions and MIPs were obtained to evaluate the vascular anatomy. CONTRAST:  100 cc IV Isovue 370 COMPARISON:  Same day CXR FINDINGS: Cardiovascular: The study is of quality for the evaluation of pulmonary embolism. There are no filling defects in the central, lobar, segmental or subsegmental pulmonary artery branches to suggest acute pulmonary embolism. Great vessels are normal in course and caliber. Normal heart size. No significant pericardial fluid/thickening. Mediastinum/Nodes: No discrete thyroid nodules. Unremarkable esophagus. No pathologically enlarged axillary, mediastinal or hilar lymph nodes. Small subcentimeter paratracheal and tracheobronchial lymph nodes. Lungs/Pleura: No pneumothorax. No pleural effusion. Bilateral upper lobe predominant centrilobular emphysema. Nonspecific patchy ground-glass opacities are also seen in both upper lobes, right middle lobe and lingula. There is mild peribronchial thickening seen in both lower lobes. No effusion. There is atelectasis in the left lower lobe laterally and along the dependent aspect of both lower lobes. Small 6 mm subpleural right middle lobe lateral segment nodular density, series 9, image 65. Upper abdomen: There are up to 4 or more circumscribed left hepatic hypodensities statistically consistent with cysts or hemangiomata the largest measuring approximately 9 mm. The visualized gallbladder is unremarkable. Is no splenomegaly. No adrenal mass. Musculoskeletal: No aggressive appearing focal osseous lesions. Bone islands of the right humeral head. Review of the MIP images confirms the above findings. IMPRESSION: No large central pulmonary embolus. Centrilobular emphysema upper lobe predominant with nonspecific bilateral upper lobe, right middle lobe and lingular ground-glass opacities which may reflect interstitial pneumonia, sequela of alveolar/pulmonary edema, pneumonitis/ alveolitis among some  considerations though not exclusive. Bibasilar dependent atelectasis. Tiny 5 mm subpleural right middle lobe density may reflect a focus of atelectasis. As a small nodule is not entirely excluded, follow up per Fleischner Society recommendations. Non-contrast chest CT at 6-12 months is recommended. If the nodule is stable at time of repeat CT, then future CT at 18-24 months (from today's scan) is considered optional for low-risk patients, but is recommended for high-risk patients. This recommendation follows the consensus statement: Guidelines for Management of Incidental Pulmonary Nodules Detected on CT Images: From the Fleischner Society 2017; Radiology 2017; 284:228-243. Electronically Signed   By: Ashley Royalty M.D.   On: 08/18/2016 18:14     Subjective:  Discharge Exam: Vitals:   08/20/16 2131 08/21/16 0458 08/21/16 0814 08/21/16 0818  BP: (!) 92/52 (!) 104/48    Pulse: 66 (!) 59    Resp: 16 18    Temp: 97.5 F (36.4 C) 98.2 F (36.8 C)    TempSrc: Oral Oral    SpO2: 96% 95% 94% 94%  Weight:      Height:       General: Pt is alert, awake, not in acute distress Cardiovascular: RRR, S1/S2 +, no rubs, no gallops Respiratory: CTA bilaterally, no wheezing, no rhonchi Abdominal: Soft, NT, ND, bowel sounds + Extremities: no edema, no cyanosis   The results of significant diagnostics from this hospitalization (including imaging, microbiology, ancillary and laboratory) are listed below for reference.    Microbiology: Recent Results (from the past 240 hour(s))  Culture, blood (routine x 2) Call MD if unable to obtain prior to antibiotics being given     Status: None (  Preliminary result)   Collection Time: 08/18/16  8:11 PM  Result Value Ref Range Status   Specimen Description BLOOD LEFT HAND  Final   Special Requests BOTTLES DRAWN AEROBIC AND ANAEROBIC 6CC  Final   Culture NO GROWTH 3 DAYS  Final   Report Status PENDING  Incomplete  Culture, blood (routine x 2) Call MD if unable to  obtain prior to antibiotics being given     Status: None (Preliminary result)   Collection Time: 08/18/16  8:19 PM  Result Value Ref Range Status   Specimen Description BLOOD LEFT ARM  Final   Special Requests BOTTLES DRAWN AEROBIC AND ANAEROBIC [place]  Final   Culture NO GROWTH 3 DAYS  Final   Report Status PENDING  Incomplete  Urine culture     Status: Abnormal   Collection Time: 08/19/16 12:36 PM  Result Value Ref Range Status   Specimen Description URINE, CLEAN CATCH  Final   Special Requests NONE  Final   Culture (A)  Final    <10,000 COLONIES/mL INSIGNIFICANT GROWTH Performed at [facility]    Report Status 08/21/2016 FINAL  Final     Labs: BNP (last 3 results)  Recent Labs  08/18/16 1626  BNP [id number]   Basic Metabolic Panel:  Recent Labs Lab 08/18/16 1626 08/20/16 0601 08/21/16 0608  NA 133* 139 140  K 3.6 4.1 4.2  CL 99* 107 107  CO2 24 26 26   GLUCOSE 154* 108* 108*  BUN 15 13 16   CREATININE 0.87 0.61 0.64  CALCIUM 9.3 8.8* 8.6*   Liver Function Tests: No results for input(s): AST, ALT, ALKPHOS, BILITOT, PROT, ALBUMIN in the last 168 hours. No results for input(s): LIPASE, AMYLASE in the last 168 hours. No results for input(s): AMMONIA in the last 168 hours. CBC:  Recent Labs Lab 08/18/16 1626 08/19/16 0559 08/20/16 0601  WBC 9.1 7.9 17.7*  NEUTROABS 8.1*  --   --   HGB 16.4* 14.0 14.1  HCT 45.3 39.5 40.3  MCV 79.9 80.3 80.9  PLT 133* 144* 178   Cardiac Enzymes: No results for input(s): CKTOTAL, CKMB, CKMBINDEX, TROPONINI in the last 168 hours. BNP: Invalid input(s): POCBNP CBG: No results for input(s): GLUCAP in the last 168 hours. D-Dimer No results for input(s): DDIMER in the last 72 hours. Hgb A1c No results for input(s): HGBA1C in the last 72 hours. Lipid Profile No results for input(s): CHOL, HDL, LDLCALC, TRIG, CHOLHDL, LDLDIRECT in the last 72 hours. Thyroid function studies No results for input(s): TSH, T4TOTAL, T3FREE,  THYROIDAB in the last 72 hours.  Invalid input(s): FREET3 Anemia work up No results for input(s): VITAMINB12, FOLATE, FERRITIN, TIBC, IRON, RETICCTPCT in the last 72 hours. Urinalysis    Component Value Date/Time   COLORURINE YELLOW 08/19/2016 1236   APPEARANCEUR CLEAR 08/19/2016 1236   LABSPEC 1.010 08/19/2016 1236   PHURINE 6.0 08/19/2016 1236   GLUCOSEU NEGATIVE 08/19/2016 1236   HGBUR SMALL (A) 08/19/2016 1236   BILIRUBINUR NEGATIVE 08/19/2016 1236   BILIRUBINUR n 02/17/2016 1312   KETONESUR NEGATIVE 08/19/2016 1236   PROTEINUR NEGATIVE 08/19/2016 1236   UROBILINOGEN negative 02/17/2016 1312   UROBILINOGEN 0.2 09/13/2012 1028   NITRITE NEGATIVE 08/19/2016 1236   LEUKOCYTESUR NEGATIVE 08/19/2016 1236   Sepsis Labs Invalid input(s): PROCALCITONIN,  WBC,  LACTICIDVEN Microbiology Recent Results (from the past 240 hour(s))  Culture, blood (routine x 2) Call MD if unable to obtain prior to antibiotics being given     Status: None (Preliminary  result)   Collection Time: 08/18/16  8:11 PM  Result Value Ref Range Status   Specimen Description BLOOD LEFT HAND  Final   Special Requests BOTTLES DRAWN AEROBIC AND ANAEROBIC 6CC  Final   Culture NO GROWTH 3 DAYS  Final   Report Status PENDING  Incomplete  Culture, blood (routine x 2) Call MD if unable to obtain prior to antibiotics being given     Status: None (Preliminary result)   Collection Time: 08/18/16  8:19 PM  Result Value Ref Range Status   Specimen Description BLOOD LEFT ARM  Final   Special Requests BOTTLES DRAWN AEROBIC AND ANAEROBIC [place]  Final   Culture NO GROWTH 3 DAYS  Final   Report Status PENDING  Incomplete  Urine culture     Status: Abnormal   Collection Time: 08/19/16 12:36 PM  Result Value Ref Range Status   Specimen Description URINE, CLEAN CATCH  Final   Special Requests NONE  Final   Culture (A)  Final    <10,000 COLONIES/mL INSIGNIFICANT GROWTH Performed at [facility]    Report Status  08/21/2016 FINAL  Final     Time coordinating discharge: Over 30 minutes  SIGNED:   Birdie Hopes, MD  [facility] 08/21/2016, 10:15 AM Pager   If 7PM-7AM, please contact night-coverage [email or web address] Password [email or web address]

## 2016-08-21 NOTE — Care Management Note (Signed)
Case Management Note  Patient Details  Name: Dorothy [name] MRN: [id number] Date of Birth: [birth year]  Subjective/Objective:                  Pt admitted with resp distress. She is from home and ind with ADL's. She has PCP, drives to appointments and has no difficulty affording medications. She plans to return home with self care. Pt being [place] home today.   Action/Plan: No CM needs.   Expected Discharge Date:      08/21/2016            Expected Discharge Plan:  Home/Self Care  In-House Referral:  NA  Discharge planning Services  CM Consult  Post Acute Care Choice:  NA Choice offered to:  NA  Status of Service:  Completed, signed off  Sherald Barge, RN 08/21/2016, 9:51 AM

## 2016-08-21 NOTE — Care Management Important Message (Signed)
Important Message  Patient Details  Name: Dorothy [name] MRN: [id number] Date of Birth: 01-Dec-1955   Medicare Important Message Given:  Yes    Sherald Barge, RN 08/21/2016, 9:50 AM

## 2016-08-23 LAB — CULTURE, BLOOD (ROUTINE X 2)
CULTURE: NO GROWTH
Culture: NO GROWTH

## 2016-10-23 DIAGNOSIS — M8580 Other specified disorders of bone density and structure, unspecified site: Secondary | ICD-10-CM

## 2016-10-23 HISTORY — DX: Other specified disorders of bone density and structure, unspecified site: M85.80

## 2017-01-24 ENCOUNTER — Other Ambulatory Visit: Payer: Self-pay | Admitting: Nurse Practitioner

## 2017-01-24 DIAGNOSIS — Z1231 Encounter for screening mammogram for malignant neoplasm of breast: Secondary | ICD-10-CM

## 2017-02-15 ENCOUNTER — Ambulatory Visit
Admission: RE | Admit: 2017-02-15 | Discharge: 2017-02-15 | Disposition: A | Discharge status: Home / Self Care | Payer: Medicare HMO | Source: Ambulatory Visit | Attending: Nurse Practitioner | Admitting: Nurse Practitioner

## 2017-02-15 DIAGNOSIS — Z1231 Encounter for screening mammogram for malignant neoplasm of breast: Secondary | ICD-10-CM

## 2017-02-22 NOTE — Progress Notes (Signed)
61 y.o. G37P1011 Widowed Caucasian female here for annual exam.    Hx VAIN III. Status post Effudex in June and July 2015   No vaginal bleeding.   Still smoking.  Tried Chantix and had nightmares.  Did not do well with nicotine patches as well.  PCP prescribing her medications to treat depression and anxiety.   PCP:   Angelina Ok, NP  Patient's last menstrual period was 10/23/1980.           Sexually active: Yes.   female The current method of family planning is status post hysterectomy.    Exercising: Yes.    Walks daily and works in the yard Smoker:  Yes, smokes 1ppd  Health Maintenance: Pap: 02/17/16 - negative and negative HR HPV; Pap 02/12/15 - negative and negative HR HPV;09-23-14 Ascus:Pos HR HPV;02-05-14 HGSIL;may include CIN 2 or CIN 3 or CIS--colposcopy revealed multifocal areas of the cuff with acetowhite change. 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  Colposcopy of the vagina 03/05/14 showed multifocal areas of the cuff with acetowhite change. 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 MMG: 02-15-17 Density B/Neg/BiRads1:TBC Colonoscopy: 2009 benign polyp with Dr. Verdell Face due 10/2017.  BMD: n/a Result  n/a TDaP: PCP Gardasil:   no HIV: 08-08-16 Neg Hep C: 02-17-16 Neg Screening Labs:  Hb today: PCP, Urine today: not done   reports that she has been smoking Cigarettes.  She has a 40.00 pack-year smoking history. She has never used smokeless tobacco. She reports that she does not drink alcohol or use drugs.  Past Medical History:  Diagnosis Date  . Anxiety   . Arthritis    hands and Knees  . Avascular necrosis of bones of both hips ([place]) 01/17/2012  . Benign essential HTN 01/17/2012  . Cancer ([place]) 1982   ovarian  . Cancer of cervix ([place])   . Constipation   . Depression    Takes Klonopin  . Depression with anxiety 01/17/2012  . GERD (gastroesophageal reflux disease)   .  Hypercholesteremia   . Hypertension    takes meds  . Hypothyroidism    on Levonthyroxine  . Hypothyroidism (acquired) 01/17/2012   Since age 71  . Ovarian cyst 01/17/2012   1995  BSO  . Personal history of colonic polyps 11/07/2007  . Pneumonia    x 2- last time > 5 years ago  . Pneumonia 01/17/2012   10 yrs ago and 5 years ago   . Rash and other nonspecific skin eruption 01/17/2012   Dorsum hands & extensor surfaces arms; intermittent    Past Surgical History:  Procedure Laterality Date  . ABDOMINAL HYSTERECTOMY  1982   ovaries retained--for CIS  . CELIAC ARTERY BYPASS  09/25/2012   Procedure: CELIAC ARTERY BYPASS;  Surgeon: Elam Dutch, MD;  Location: [facility];  Service: Vascular;  Laterality: N/A;  Bypass using Hemashield Gold Vascular Graft 23mm x 51mm x 40cm  . COLONOSCOPY  2009   [place]-polyps, 5 yr FU  . JOINT REPLACEMENT    . MESENTERIC ARTERY BYPASS  09/25/2012   Procedure: MESENTERIC ARTERY BYPASS;  Surgeon: Elam Dutch, MD;  Location: [facility] OR;  Service: Vascular;  Laterality: N/A;  Bypass using Hemashield Gold Vascular Graft 4mm x 26mm x 40cm  . 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 TOTAL HIP ARTHROPLASTY  09/06/2011  Procedure: TOTAL HIP ARTHROPLASTY;  Surgeon: Ninetta Lights, MD;  Location: [facility];  Service: Orthopedics;  Laterality: Left;  TOTAL HIP ARTHROPLASTY LEFT SIDE  . TOTAL HIP ARTHROPLASTY  10/11/2011   Procedure: TOTAL HIP ARTHROPLASTY;  Surgeon: Ninetta Lights, MD;  Location: [facility];  Service: Orthopedics;  Laterality: Right;  120 MINUTES FOR THIS SURGERY  . WOUND EXPLORATION  10/02/2012   Procedure: WOUND EXPLORATION;  Surgeon: Elam Dutch, MD;  Location: [facility] OR;  Service: Vascular;  Laterality: N/A;  abdominal wound exploration    Current Outpatient Prescriptions  Medication Sig Dispense Refill  . ADVAIR DISKUS 250-50 MCG/DOSE AEPB Inhale 1 puff into the lungs daily as needed (for shortness of breath).      Marland Kitchen albuterol (PROVENTIL HFA;VENTOLIN HFA) 108 (90 BASE) MCG/ACT inhaler Inhale 1-2 puffs into the lungs every 6 (six) hours as needed for wheezing or shortness of breath. 1 Inhaler 0  . albuterol (PROVENTIL) (2.5 MG/3ML) 0.083% nebulizer solution Take 3 mLs (2.5 mg total) by nebulization every 4 (four) hours as needed for wheezing or shortness of breath. 75 mL 12  . ARIPiprazole (ABILIFY) 10 MG tablet Take 1 tablet by mouth at bedtime.    Marland Kitchen CALCIUM PO Take 1 tablet by mouth daily.    . clonazePAM (KLONOPIN) 0.5 MG tablet Take 1 mg by mouth 2 (two) times daily.     . hydrochlorothiazide (HYDRODIURIL) 25 MG tablet Take 12.5 mg by mouth daily.    Marland Kitchen levothyroxine (SYNTHROID, LEVOTHROID) 100 MCG tablet Take 1 tablet by mouth daily.    Marland Kitchen lovastatin (MEVACOR) 20 MG tablet Take 1 tablet by mouth daily.    . Multiple Vitamins-Minerals (MULTIVITAMINS THER. W/MINERALS) TABS tablet Take 1 tablet by mouth daily.    Marland Kitchen venlafaxine (EFFEXOR-XR) 150 MG 24 hr capsule Take 300 mg by mouth daily with breakfast.      No current facility-administered medications for this visit.     Family History  Problem Relation Age of Onset  . Breast cancer Sister 33    dec age 37  . Cancer Sister   . Cancer Mother     lung  . Heart failure Father   . Hypertension Son   . Ovarian cancer Paternal 2     dec age 77  . Heart attack Brother 68  . Anesthesia problems Neg Hx   . Hypotension Neg Hx   . Malignant hyperthermia Neg Hx   . Pseudochol deficiency Neg Hx   . Colon cancer Neg Hx   . Esophageal cancer Neg Hx   . Rectal cancer Neg Hx   . Stomach cancer Neg Hx     ROS:  Pertinent items are noted in HPI.  Otherwise, a comprehensive ROS was negative.  Exam:   BP 118/78 (BP Location: Left Arm, Patient Position: Sitting, Cuff Size: Normal)   Pulse 66   Resp 14   Ht 5\' 6"  (1.676 m)   Wt 156 lb 6.4 oz (70.9 kg)   LMP 10/23/1980 Comment: Hyst age 45(ovaries retained)  BMI 25.24 kg/m     General  appearance: alert, cooperative and appears stated age Head: Normocephalic, without obvious abnormality, atraumatic Neck: no adenopathy, supple, symmetrical, trachea midline and thyroid normal to inspection and palpation Lungs: clear to auscultation bilaterally Breasts: normal appearance, no masses or tenderness, No nipple retraction or dimpling, No nipple discharge or bleeding, No axillary or supraclavicular adenopathy Heart: regular rate and rhythm Abdomen: soft, non-tender; no masses, no organomegaly Extremities: extremities normal, atraumatic,  no cyanosis or edema Skin: Skin color, texture, turgor normal. No rashes or lesions Lymph nodes: Cervical, supraclavicular, and axillary nodes normal. No abnormal inguinal nodes palpated Neurologic: Grossly normal  Pelvic: External genitalia:  no lesions              Urethra:  normal appearing urethra with no masses, tenderness or lesions              Bartholins and Skenes: normal                 Vagina: normal appearing vagina with normal color and discharge, no lesions              Cervix:  Absent.               Pap taken: Yes.   Bimanual Exam:  Uterus:   Absent.               Adnexa: no mass, fullness, tenderness              Rectal exam: Yes.  .  Confirms.              Anus:  normal sphincter tone, no lesions  Chaperone was present for exam.  Assessment:   Well woman visit with normal exam.  Discussed 3D. Former ERT patient.  Status post total vaginal hysterectomy for CIS.  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 Status post Effudex.  FH of ovarian cancer in paternal grandmother and breast cancer in sister. Menopausal symptoms.  Hx mesenteric ischemia and bypass surgery. Smoker.   Plan: Mammogram screening discussed.  Discussed 3D.  Recommended self breast awareness. Pap and HR HPV as above. Guidelines for Calcium, Vitamin D, regular exercise program including cardiovascular and weight bearing  exercise. Referral for genetic counseling and testing.  Discussed smoking cessation classed through [facility].  I did mention Wellbutrin but patient is already on medications for depression and anxiety.  BND ordered.  [facility] will [facility] to schedule.  Follow up annually and prn.    After visit summary provided.

## 2017-02-23 ENCOUNTER — Encounter: Payer: Self-pay | Admitting: Obstetrics and Gynecology

## 2017-02-23 ENCOUNTER — Ambulatory Visit (INDEPENDENT_AMBULATORY_CARE_PROVIDER_SITE_OTHER): Payer: Medicare HMO | Admitting: Obstetrics and Gynecology

## 2017-02-23 VITALS — BP 118/78 | HR 66 | Resp 14 | Ht 66.0 in | Wt 156.4 lb

## 2017-02-23 DIAGNOSIS — Z01419 Encounter for gynecological examination (general) (routine) without abnormal findings: Secondary | ICD-10-CM | POA: Diagnosis not present

## 2017-02-23 DIAGNOSIS — Z8603 Personal history of neoplasm of uncertain behavior: Secondary | ICD-10-CM

## 2017-02-23 DIAGNOSIS — Z803 Family history of malignant neoplasm of breast: Secondary | ICD-10-CM

## 2017-02-23 DIAGNOSIS — Z780 Asymptomatic menopausal state: Secondary | ICD-10-CM | POA: Diagnosis not present

## 2017-02-23 DIAGNOSIS — Z8041 Family history of malignant neoplasm of ovary: Secondary | ICD-10-CM | POA: Diagnosis not present

## 2017-02-23 NOTE — Patient Instructions (Signed)
EXERCISE AND DIET:  We recommended that you start or continue a regular exercise program for good health. Regular exercise means any activity that makes your heart beat faster and makes you sweat.  We recommend exercising at least 30 minutes per day at least 3 days a week, preferably 4 or 5.  We also recommend a diet low in fat and sugar.  Inactivity, poor dietary choices and obesity can cause diabetes, heart attack, stroke, and kidney damage, among others.    ALCOHOL AND SMOKING:  Women should limit their alcohol intake to no more than 7 drinks/beers/glasses of wine (combined, not each!) per week. Moderation of alcohol intake to this level decreases your risk of breast cancer and liver damage. And of course, no recreational drugs are part of a healthy lifestyle.  And absolutely no smoking or even second hand smoke. Most people know smoking can cause heart and lung diseases, but did you know it also contributes to weakening of your bones? Aging of your skin?  Yellowing of your teeth and nails?  CALCIUM AND VITAMIN D:  Adequate intake of calcium and Vitamin D are recommended.  The recommendations for exact amounts of these supplements seem to change often, but generally speaking 600 mg of calcium (either carbonate or citrate) and 800 units of Vitamin D per day seems prudent. Certain women may benefit from higher intake of Vitamin D.  If you are among these women, your doctor will have told you during your visit.    PAP SMEARS:  Pap smears, to check for cervical cancer or precancers,  have traditionally been done yearly, although recent scientific advances have shown that most women can have pap smears less often.  However, every woman still should have a physical exam from her gynecologist every year. It will include a breast check, inspection of the vulva and vagina to check for abnormal growths or skin changes, a visual exam of the cervix, and then an exam to evaluate the size and shape of the uterus and  ovaries.  And after 60 years of age, a rectal exam is indicated to check for rectal cancers. We will also provide age appropriate advice regarding health maintenance, like when you should have certain vaccines, screening for sexually transmitted diseases, bone density testing, colonoscopy, mammograms, etc.   MAMMOGRAMS:  All women over 40 years old should have a yearly mammogram. Many facilities now offer a "3D" mammogram, which may cost around $50 extra out of pocket. If possible,  we recommend you accept the option to have the 3D mammogram performed.  It both reduces the number of women who will be called back for extra views which then turn out to be normal, and it is better than the routine mammogram at detecting truly abnormal areas.    COLONOSCOPY:  Colonoscopy to screen for colon cancer is recommended for all women at age 50.  We know, you hate the idea of the prep.  We agree, BUT, having colon cancer and not knowing it is worse!!  Colon cancer so often starts as a polyp that can be seen and removed at colonscopy, which can quite literally save your life!  And if your first colonoscopy is normal and you have no family history of colon cancer, most women don't have to have it again for 10 years.  Once every ten years, you can do something that may end up saving your life, right?  We will be happy to help you get it scheduled when you are ready.    Be sure to check your insurance coverage so you understand how much it will cost.  It may be covered as a preventative service at no cost, but you should check your particular policy.     Bupropion tablets (Depression/Mood Disorders) What is this medicine? BUPROPION (byoo PROE pee on) is used to treat depression. This medicine may be used for other purposes; ask your health care provider or pharmacist if you have questions. COMMON BRAND NAME(S): Wellbutrin What should I tell my health care provider before I take this medicine? They need to know if you have  any of these conditions: -an eating disorder, such as anorexia or bulimia -bipolar disorder or psychosis -diabetes or high blood sugar, treated with medication -glaucoma -heart disease, previous heart attack, or irregular heart beat -head injury or brain tumor -high blood pressure -kidney or liver disease -seizures -suicidal thoughts or a previous suicide attempt -Tourette's syndrome -weight loss -an unusual or allergic reaction to bupropion, other medicines, foods, dyes, or preservatives -breast-feeding -pregnant or trying to become pregnant How should I use this medicine? Take this medicine by mouth with a glass of water. Follow the directions on the prescription label. You can take it with or without food. If it upsets your stomach, take it with food. Take your medicine at regular intervals. Do not take your medicine more often than directed. Do not stop taking this medicine suddenly except upon the advice of your doctor. Stopping this medicine too quickly may cause serious side effects or your condition may worsen. A special MedGuide will be given to you by the pharmacist with each prescription and refill. Be sure to read this information carefully each time. Talk to your pediatrician regarding the use of this medicine in children. Special care may be needed. Overdosage: If you think you have taken too much of this medicine contact a poison control center or emergency room at once. NOTE: This medicine is only for you. Do not share this medicine with others. What if I miss a dose? If you miss a dose, take it as soon as you can. If it is less than four hours to your next dose, take only that dose and skip the missed dose. Do not take double or extra doses. What may interact with this medicine? Do not take this medicine with any of the following medications: -linezolid -MAOIs like Azilect, Carbex, Eldepryl, Marplan, Nardil, and Parnate -methylene blue (injected into a vein) -other  medicines that contain bupropion like Zyban This medicine may also interact with the following medications: -alcohol -certain medicines for anxiety or sleep -certain medicines for blood pressure like metoprolol, propranolol -certain medicines for depression or psychotic disturbances -certain medicines for HIV or AIDS like efavirenz, lopinavir, nelfinavir, ritonavir -certain medicines for irregular heart beat like propafenone, flecainide -certain medicines for Parkinson's disease like amantadine, levodopa -certain medicines for seizures like carbamazepine, phenytoin, phenobarbital -cimetidine -clopidogrel -cyclophosphamide -digoxin -furazolidone -isoniazid -nicotine -orphenadrine -procarbazine -steroid medicines like prednisone or cortisone -stimulant medicines for attention disorders, weight loss, or to stay awake -tamoxifen -theophylline -thiotepa -ticlopidine -tramadol -warfarin This list may not describe all possible interactions. Give your health care provider a list of all the medicines, herbs, non-prescription drugs, or dietary supplements you use. Also tell them if you smoke, drink alcohol, or use illegal drugs. Some items may interact with your medicine. What should I watch for while using this medicine? Tell your doctor if your symptoms do not get better or if they get worse. Visit your doctor or health   care professional for regular checks on your progress. Because it may take several weeks to see the full effects of this medicine, it is important to continue your treatment as prescribed by your doctor. Patients and their families should watch out for new or worsening thoughts of suicide or depression. Also watch out for sudden changes in feelings such as feeling anxious, agitated, panicky, irritable, hostile, aggressive, impulsive, severely restless, overly excited and hyperactive, or not being able to sleep. If this happens, especially at the beginning of treatment or after a  change in dose, call your health care professional. Avoid alcoholic drinks while taking this medicine. Drinking excessive alcoholic beverages, using sleeping or anxiety medicines, or quickly stopping the use of these agents while taking this medicine may increase your risk for a seizure. Do not drive or use heavy machinery until you know how this medicine affects you. This medicine can impair your ability to perform these tasks. Do not take this medicine close to bedtime. It may prevent you from sleeping. Your mouth may get dry. Chewing sugarless gum or sucking hard candy, and drinking plenty of water may help. Contact your doctor if the problem does not go away or is severe. What side effects may I notice from receiving this medicine? Side effects that you should report to your doctor or health care professional as soon as possible: -allergic reactions like skin rash, itching or hives, swelling of the face, lips, or tongue -breathing problems -changes in vision -confusion -elevated mood, decreased need for sleep, racing thoughts, impulsive behavior -fast or irregular heartbeat -hallucinations, loss of contact with reality -increased blood pressure -redness, blistering, peeling or loosening of the skin, including inside the mouth -seizures -suicidal thoughts or other mood changes -unusually weak or tired -vomiting Side effects that usually do not require medical attention (report to your doctor or health care professional if they continue or are bothersome): -constipation -headache -loss of appetite -nausea -tremors -weight loss This list may not describe all possible side effects. Call your doctor for medical advice about side effects. You may report side effects to FDA at 1-800-FDA-1088. Where should I keep my medicine? Keep out of the reach of children. Store at room temperature between 20 and 25 degrees C (68 and 77 degrees F), away from direct sunlight and moisture. Keep tightly  closed. Throw away any unused medicine after the expiration date. NOTE: This sheet is a summary. It may not cover all possible information. If you have questions about this medicine, talk to your doctor, pharmacist, or health care provider.  2018 Elsevier/Gold Standard (2016-03-31 13:44:21)  

## 2017-02-28 LAB — IPS PAP TEST WITH HPV

## 2017-03-20 ENCOUNTER — Other Ambulatory Visit (HOSPITAL_COMMUNITY): Payer: Self-pay | Admitting: Nurse Practitioner

## 2017-03-20 DIAGNOSIS — R9389 Abnormal findings on diagnostic imaging of other specified body structures: Secondary | ICD-10-CM

## 2017-03-21 ENCOUNTER — Telehealth: Payer: Self-pay | Admitting: Obstetrics and Gynecology

## 2017-03-21 NOTE — Telephone Encounter (Signed)
Patient is returning Becky's call to schedule appointment for a bone scan.

## 2017-03-22 NOTE — Telephone Encounter (Signed)
Returned call to patient. Left voicemail message requesting a return call °

## 2017-03-23 ENCOUNTER — Other Ambulatory Visit: Payer: Self-pay | Admitting: Obstetrics and Gynecology

## 2017-03-23 ENCOUNTER — Telehealth: Payer: Self-pay

## 2017-03-23 DIAGNOSIS — E2839 Other primary ovarian failure: Secondary | ICD-10-CM

## 2017-03-23 NOTE — Telephone Encounter (Signed)
Thank you for the update.  Encounter closed. 

## 2017-03-23 NOTE — Telephone Encounter (Signed)
Patient returned call. Confirmed for patient she can call [facility] at [phone], to schedule a bone density test. Also advised patient [facility] had been trying to reach her to schedule an appointment with a genetics counselor.  I have provided patient with their phone number as well. Patient is agreeable to contacting their office to discuss benefits and scheduling.  Routing to Dr Quincy Simmonds

## 2017-03-23 NOTE — Telephone Encounter (Signed)
Spoke with Cherish at the [facility] at time of incoming call. Per Cherish the patient's BMD diagnosis needs to be changed to estrogen deficiency as insurance will not coverage menopausal for study. BMD ordered with estrogen deficiency diagnosis to the [facility].  Routing to provider for final review. Patient agreeable to disposition. Will close encounter.

## 2017-03-25 ENCOUNTER — Telehealth: Payer: Self-pay | Admitting: Genetics

## 2017-03-25 ENCOUNTER — Encounter: Payer: Self-pay | Admitting: Genetics

## 2017-03-25 NOTE — Telephone Encounter (Signed)
Appt has been scheduled for the pt to see Mal Misty on 7/3 at 10am. Pt agreed to the appt date and time. Demographics verified. Letter mailed to the pt.

## 2017-03-27 ENCOUNTER — Ambulatory Visit (HOSPITAL_COMMUNITY)
Admission: RE | Admit: 2017-03-27 | Discharge: 2017-03-27 | Disposition: A | Discharge status: Home / Self Care | Payer: Medicare HMO | Source: Ambulatory Visit | Attending: Nurse Practitioner | Admitting: Nurse Practitioner

## 2017-03-27 DIAGNOSIS — I2510 Atherosclerotic heart disease of native coronary artery without angina pectoris: Secondary | ICD-10-CM | POA: Insufficient documentation

## 2017-03-27 DIAGNOSIS — I700 Atherosclerosis of aorta: Secondary | ICD-10-CM | POA: Diagnosis not present

## 2017-03-27 DIAGNOSIS — R918 Other nonspecific abnormal finding of lung field: Secondary | ICD-10-CM | POA: Insufficient documentation

## 2017-03-27 DIAGNOSIS — R9389 Abnormal findings on diagnostic imaging of other specified body structures: Secondary | ICD-10-CM

## 2017-03-27 DIAGNOSIS — R938 Abnormal findings on diagnostic imaging of other specified body structures: Secondary | ICD-10-CM | POA: Diagnosis present

## 2017-04-18 ENCOUNTER — Encounter: Payer: Self-pay | Admitting: Obstetrics and Gynecology

## 2017-04-18 ENCOUNTER — Ambulatory Visit
Admission: RE | Admit: 2017-04-18 | Discharge: 2017-04-18 | Disposition: A | Discharge status: Home / Self Care | Payer: Medicare HMO | Source: Ambulatory Visit | Attending: Obstetrics and Gynecology | Admitting: Obstetrics and Gynecology

## 2017-04-18 DIAGNOSIS — E2839 Other primary ovarian failure: Secondary | ICD-10-CM

## 2017-04-24 ENCOUNTER — Other Ambulatory Visit: Payer: Medicare HMO

## 2017-04-24 ENCOUNTER — Encounter: Payer: Medicare HMO | Admitting: Genetics

## 2017-05-10 ENCOUNTER — Other Ambulatory Visit: Payer: Self-pay

## 2017-05-10 DIAGNOSIS — K551 Chronic vascular disorders of intestine: Secondary | ICD-10-CM

## 2017-05-11 ENCOUNTER — Encounter: Payer: Self-pay | Admitting: Vascular Surgery

## 2017-05-17 ENCOUNTER — Ambulatory Visit (HOSPITAL_COMMUNITY)
Admission: RE | Admit: 2017-05-17 | Discharge: 2017-05-17 | Disposition: A | Discharge status: Home / Self Care | Payer: Medicare HMO | Source: Ambulatory Visit | Attending: Vascular Surgery | Admitting: Vascular Surgery

## 2017-05-17 ENCOUNTER — Ambulatory Visit (INDEPENDENT_AMBULATORY_CARE_PROVIDER_SITE_OTHER): Payer: Medicare HMO | Admitting: Vascular Surgery

## 2017-05-17 ENCOUNTER — Encounter: Payer: Self-pay | Admitting: Vascular Surgery

## 2017-05-17 VITALS — BP 125/73 | HR 74 | Temp 97.3°F | Resp 16 | Ht 66.0 in | Wt 147.0 lb

## 2017-05-17 DIAGNOSIS — K551 Chronic vascular disorders of intestine: Secondary | ICD-10-CM

## 2017-05-17 DIAGNOSIS — K55059 Acute (reversible) ischemia of intestine, part and extent unspecified: Secondary | ICD-10-CM | POA: Diagnosis not present

## 2017-05-17 DIAGNOSIS — R109 Unspecified abdominal pain: Secondary | ICD-10-CM | POA: Diagnosis present

## 2017-05-17 NOTE — Progress Notes (Signed)
Patient is a 61 year old female who presents for followup today. 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 Her weight has stayed the same. She denies any pain with eating. Unfortunate she continues to smoke 1 pack of cigarettes per day. She states that she will again try to quit.  Review of systems: She denies chest pain. She denies shortness of breath.  Physical exam:  Vitals:   05/17/17 0950  BP: 125/73  Pulse: 74  Resp: 16  Temp: (!) 97.3 F (36.3 C)  TempSrc: Oral  SpO2: 98%  Weight: 147 lb (66.7 kg)  Height: 5\' 6"  (1.676 m)    Neck: No carotid bruits or chest: Clear to auscultation bilaterally  Cardiac: Regular rate and rhythm without murmur.  Abdomen: Soft nontender no mass  Extremities: 2+ femorals 2+ posterior tibial pulses bilaterally  Data: Patient had a mesenteric duplex exam today which showed no increased velocities in the bypass graft and both limbs to the celiac and superior mesenteric artery widely patent.  Assessment: Doing well status post aortomesenteric bypass.  Plan: Follow-up 1 year with her nurse practitioner in a mesenteric ultrasound. She will follow-up sooner if she is losing weight for his abdominal pain.  Ruta Hinds, MD [facility] Office: [phone] Pager: [phone]

## 2017-10-29 ENCOUNTER — Telehealth: Payer: Self-pay | Admitting: Obstetrics and Gynecology

## 2017-10-29 NOTE — Telephone Encounter (Signed)
Patient has been bleeding and thinks she may have a uti.

## 2017-10-29 NOTE — Telephone Encounter (Signed)
Spoke with patient. Patient states that her urine has an odor. Reports she notices this intermittently. Denies any urinary frequency, urgency, pain, fever, chills, or lower back pain. States 1 month ago and one day last week she had one day of vaginal spotting. Denies any current bleeding. Requesting to be seen by Dr.Silva only on 11/05/2017. Appointment scheduled for 11/05/2017 at 12:30 pm with Dr.Silva. Patient is agreeable and verbalizes understanding. Aware if symptoms worsen or develops new symptoms will need to be seen for further evaluation earlier with our office or local urgent care/ER patient is agreeable.  Routing to covering provider for final review. Patient agreeable to disposition. Will close encounter.

## 2017-10-31 NOTE — Progress Notes (Deleted)
GYNECOLOGY  VISIT   HPI: 61 y.o.   Widowed  Caucasian  female   [id number] with Patient's last menstrual period was 10/23/1980.   here for     GYNECOLOGIC HISTORY: Patient's last menstrual period was 10/23/1980. Contraception:  Hysterectomy Menopausal hormone therapy:  none Last mammogram:   02-15-17 Density B/Neg/BiRads1:TBC Last pap smear: 02-23-17 Neg:Neg HR HPV, 02-17-16 Neg:neg HR HPV. **Patient does have history of abnormal paps.         OB History    Gravida Para Term Preterm AB Living   2 1 1   1 1    SAB TAB Ectopic Multiple Live Births   1                 Patient Active Problem List   Diagnosis Date Noted  . Pulmonary nodule 08/18/2016  . COPD with exacerbation ([place]) 08/18/2016  . 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 Acute respiratory failure with hypoxia ([place]) 09/25/2012  . Hyponatremia 07/10/2012  . Abdominal pain 07/10/2012  . Hematochezia 07/10/2012  . Avascular necrosis of bones of both hips ([place]) 01/17/2012  . Hypothyroidism (acquired) 01/17/2012  . Benign essential HTN 01/17/2012  . Depression with anxiety 01/17/2012  . Cancer of cervix ([place]) 01/17/2012  . Ovarian cyst 01/17/2012  . Community acquired pneumonia 01/17/2012  . 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 Rash and other nonspecific skin eruption 01/17/2012  . Personal history of colonic polyps 11/07/2007    Past Medical History:  Diagnosis Date  . Anxiety   . Arthritis    hands and Knees  . Avascular necrosis of bones of both hips ([place]) 01/17/2012  . Benign essential HTN 01/17/2012  . Cancer ([place]) 1982   ovarian  . Cancer of cervix ([place])   . Constipation   . Depression    Takes Klonopin  . Depression with anxiety 01/17/2012  . GERD (gastroesophageal reflux disease)   . Hypercholesteremia   . Hypertension    takes meds  . Hypothyroidism    on Levonthyroxine  . Hypothyroidism (acquired) 01/17/2012   Since age 67  . Osteopenia 2018   forearm  . Ovarian cyst 01/17/2012   1995  BSO  . Personal history of colonic polyps 11/07/2007  . Pneumonia    x 2- last time > 5 years ago  . Pneumonia 01/17/2012   10 yrs ago and 5 years ago   . Rash and other nonspecific skin eruption 01/17/2012   Dorsum hands & extensor surfaces arms; intermittent    Past Surgical History:  Procedure Laterality Date  . ABDOMINAL HYSTERECTOMY  1982   ovaries retained--for CIS  . CELIAC ARTERY BYPASS  09/25/2012   Procedure: CELIAC ARTERY BYPASS;  Surgeon: Elam Dutch, MD;  Location: [facility] OR;  Service: Vascular;  Laterality: N/A;  Bypass using Hemashield Gold Vascular Graft 45mm x 40mm x 40cm  . COLONOSCOPY  2009   [place]-polyps, 5 yr FU  . JOINT REPLACEMENT    . MESENTERIC ARTERY BYPASS  09/25/2012   Procedure: MESENTERIC ARTERY BYPASS;  Surgeon: Elam Dutch, MD;  Location: [facility] OR;  Service: Vascular;  Laterality: N/A;  Bypass using Hemashield Gold Vascular Graft 76mm x 48mm x 40cm  . 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 TOTAL HIP ARTHROPLASTY  09/06/2011   Procedure: TOTAL HIP ARTHROPLASTY;  Surgeon: Ninetta Lights, MD;  Location: [place];  Service: Orthopedics;  Laterality: Left;  TOTAL HIP ARTHROPLASTY  LEFT SIDE  . TOTAL HIP ARTHROPLASTY  10/11/2011   Procedure: TOTAL HIP ARTHROPLASTY;  Surgeon: Ninetta Lights, MD;  Location: [place];  Service: Orthopedics;  Laterality: Right;  120 MINUTES FOR THIS SURGERY  . WOUND EXPLORATION  10/02/2012   Procedure: WOUND EXPLORATION;  Surgeon: Elam Dutch, MD;  Location: [place] [facility];  Service: Vascular;  Laterality: N/A;  abdominal wound exploration    Current Outpatient Medications  Medication Sig Dispense Refill  . ADVAIR DISKUS 250-50 MCG/DOSE AEPB Inhale 1 puff into the lungs daily as needed (for shortness of breath).     . ARIPiprazole (ABILIFY) 10 MG tablet Take 1 tablet by mouth at bedtime.    Marland Kitchen CALCIUM PO Take 1 tablet by mouth daily.    . clonazePAM (KLONOPIN) 0.5 MG tablet Take 1 mg by  mouth 2 (two) times daily.     . hydrochlorothiazide (HYDRODIURIL) 25 MG tablet Take 12.5 mg by mouth daily.    Marland Kitchen levothyroxine (SYNTHROID, LEVOTHROID) 100 MCG tablet Take 1 tablet by mouth daily.    Marland Kitchen lovastatin (MEVACOR) 20 MG tablet Take 1 tablet by mouth daily.    . Multiple Vitamins-Minerals (MULTIVITAMINS THER. W/MINERALS) TABS tablet Take 1 tablet by mouth daily.    Marland Kitchen venlafaxine (EFFEXOR-XR) 150 MG 24 hr capsule Take 300 mg by mouth daily with breakfast.      No current facility-administered medications for this visit.      ALLERGIES: Sulfa antibiotics  Family History  Problem Relation Age of Onset  . Breast cancer Sister 46       dec age 51  . Cancer Sister   . Cancer Mother        lung  . Heart failure Father   . Hypertension Son   . Ovarian cancer Paternal 3        dec age 34  . Heart attack Brother 55  . Anesthesia problems Neg Hx   . Hypotension Neg Hx   . Malignant hyperthermia Neg Hx   . Pseudochol deficiency Neg Hx   . Colon cancer Neg Hx   . Esophageal cancer Neg Hx   . Rectal cancer Neg Hx   . Stomach cancer Neg Hx     Social History   Socioeconomic History  . Marital status: Widowed    Spouse name: Not on file  . Number of children: 1  . Years of education: Not on file  . Highest education level: Not on file  Social Needs  . Financial resource strain: Not on file  . Food insecurity - worry: Not on file  . Food insecurity - inability: Not on file  . Transportation needs - medical: Not on file  . Transportation needs - non-medical: Not on file  Occupational History  . Occupation: disabled  Tobacco Use  . Smoking status: Current Every Day Smoker    Packs/day: 1.00    Years: 40.00    Pack years: 40.00    Types: Cigarettes  . Smokeless tobacco: Never Used  . Tobacco comment: pt states that she wants to quit but needs help was advised to call 1-800-QUITNOW and to speak with PCP about avaliable aids  Substance and Sexual Activity  .  Alcohol use: No    Alcohol/week: 0.0 oz  . Drug use: No  . Sexual activity: Yes    Partners: Male    Birth control/protection: Surgical    Comment: TVH/ovaries retained  Other Topics Concern  . Not on file  Social History Narrative  .  Not on file    ROS:  Pertinent items are noted in HPI.  PHYSICAL EXAMINATION:    LMP 10/23/1980 Comment: Hyst age 56(ovaries retained)    General appearance: alert, cooperative and appears stated age Head: Normocephalic, without obvious abnormality, atraumatic Neck: no adenopathy, supple, symmetrical, trachea midline and thyroid normal to inspection and palpation Lungs: clear to auscultation bilaterally Breasts: normal appearance, no masses or tenderness, No nipple retraction or dimpling, No nipple discharge or bleeding, No axillary or supraclavicular adenopathy Heart: regular rate and rhythm Abdomen: soft, non-tender, no masses,  no organomegaly Extremities: extremities normal, atraumatic, no cyanosis or edema Skin: Skin color, texture, turgor normal. No rashes or lesions Lymph nodes: Cervical, supraclavicular, and axillary nodes normal. No abnormal inguinal nodes palpated Neurologic: Grossly normal  Pelvic: External genitalia:  no lesions              Urethra:  normal appearing urethra with no masses, tenderness or lesions              Bartholins and Skenes: normal                 Vagina: normal appearing vagina with normal color and discharge, no lesions              Cervix: no lesions                Bimanual Exam:  Uterus:  normal size, contour, position, consistency, mobility, non-tender              Adnexa: no mass, fullness, tenderness              Rectal exam: {yes no:[id number]}.  Confirms.              Anus:  normal sphincter tone, no lesions  Chaperone was present for exam.  ASSESSMENT     PLAN     An After Visit Summary was printed and given to the patient.  ______ minutes face to face time of which over 50% was spent in  counseling.

## 2017-11-01 ENCOUNTER — Telehealth: Payer: Self-pay

## 2017-11-01 NOTE — Telephone Encounter (Signed)
Called patient to r/s appointment on 11-05-17 since Dr.Silva out of office on FMLA. Cell # mailbox "Full", left message on ans.machine at HM# to call office.

## 2017-11-02 NOTE — Telephone Encounter (Signed)
Patient is returning a call to [place]. Patient said to please call her cell phone [phone].

## 2017-11-02 NOTE — Telephone Encounter (Signed)
Spoke with patient and cancelled appointment with Dr.Silva 11-05-17 and rescheduled with Dr.Jertson at 10:00am.

## 2017-11-05 ENCOUNTER — Ambulatory Visit: Payer: Medicare HMO | Admitting: Obstetrics and Gynecology

## 2017-11-06 ENCOUNTER — Other Ambulatory Visit: Payer: Self-pay

## 2017-11-06 ENCOUNTER — Ambulatory Visit: Payer: Medicare HMO | Admitting: Certified Nurse Midwife

## 2017-11-06 ENCOUNTER — Encounter: Payer: Self-pay | Admitting: Certified Nurse Midwife

## 2017-11-06 VITALS — BP 120/76 | HR 64 | Temp 98.1°F | Resp 16 | Ht 66.0 in | Wt 142.0 lb

## 2017-11-06 DIAGNOSIS — N952 Postmenopausal atrophic vaginitis: Secondary | ICD-10-CM

## 2017-11-06 DIAGNOSIS — N390 Urinary tract infection, site not specified: Secondary | ICD-10-CM

## 2017-11-06 DIAGNOSIS — Z8742 Personal history of other diseases of the female genital tract: Secondary | ICD-10-CM

## 2017-11-06 DIAGNOSIS — R319 Hematuria, unspecified: Secondary | ICD-10-CM | POA: Diagnosis not present

## 2017-11-06 DIAGNOSIS — N930 Postcoital and contact bleeding: Secondary | ICD-10-CM | POA: Diagnosis not present

## 2017-11-06 LAB — POCT URINALYSIS DIPSTICK
BILIRUBIN UA: NEGATIVE
Glucose, UA: NEGATIVE
Ketones, UA: NEGATIVE
NITRITE UA: NEGATIVE
PH UA: 5 (ref 5.0–8.0)
PROTEIN UA: NEGATIVE
UROBILINOGEN UA: NEGATIVE U/dL — AB

## 2017-11-06 NOTE — Progress Notes (Signed)
62 y.o. Widowed Caucasian female G2P1011 here with complaint of blood from vagina in 12/18 and occurred again one week ago after sexual activity. Had some soreness after sexual activity. Denies any pain today or vaginal bleeding.. Having no urinary frequency,no urgency, no pain with urination. Patient denies fever, chills,  or back pain. Some nausea last week with bloating, resolved. No new personal products. Patient is sexually active, but no STD concerns or testing needed.. Denies any vaginal symptoms of itching or burning or bleeding today.. When noted blood from vagina, placed finger in vagina to make sure of source and it was after sexual activity. Patient has had TAH due cervical cancer.. Does not use vaginal lubrication with sexual activity. She increased her water intake when she thought a UTI might be present and they resolved.. Symptoms not present today. No other health concerns today.  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 Discussed options for treatment, will not use estrogen due to cervical cancer history. Would prefer Coconut oil trial. Instructions given and discussed use with sexual activity or Astroglide to prevent bleeding and no hand  stimulation in vagina due atrophy. Questions addressed. Patient will call if bleeding occurs again.  Will check for vaginal infection which can also cause irritation. Will treat if indicated Lab: Affirm TOI:ZTIWP micro, culture Reviewed warning signs and symptoms of UTI and need to advise if occurring. Will treat if indicated Encouraged to limit soda, tea, and coffee and be sure to increase water intake.   RV prn,aex

## 2017-11-06 NOTE — Patient Instructions (Signed)
Urinary Tract Infection, Adult A urinary tract infection (UTI) is an infection of any part of the urinary tract. The urinary tract includes the:  Kidneys.  Ureters.  Bladder.  Urethra.  These organs make, store, and get rid of pee (urine) in the body. Follow these instructions at home:  Take over-the-counter and prescription medicines only as told by your doctor.  If you were prescribed an antibiotic medicine, take it as told by your doctor. Do not stop taking the antibiotic even if you start to feel better.  Avoid the following drinks: ? Alcohol. ? Caffeine. ? Tea. ? Carbonated drinks.  Drink enough fluid to keep your pee clear or pale yellow.  Keep all follow-up visits as told by your doctor. This is important.  Make sure to: ? Empty your bladder often and completely. Do not to hold pee for long periods of time. ? Empty your bladder before and after sex. ? Wipe from front to back after a bowel movement if you are female. Use each tissue one time when you wipe. Contact a doctor if:  You have back pain.  You have a fever.  You feel sick to your stomach (nauseous).  You throw up (vomit).  Your symptoms do not get better after 3 days.  Your symptoms go away and then come back. Get help right away if:  You have very bad back pain.  You have very bad lower belly (abdominal) pain.  You are throwing up and cannot keep down any medicines or water. This information is not intended to replace advice given to you by your health care provider. Make sure you discuss any questions you have with your health care provider. Document Released: 03/27/2008 Document Revised: 03/16/2016 Document Reviewed: 08/30/2015 Elsevier Interactive Patient Education  2018 [place]. Atrophic Vaginitis Atrophic vaginitis is when the tissues that line the vagina become dry and thin. This is caused by a drop in estrogen. Estrogen helps:  To keep the vagina moist.  To make a clear fluid  that helps: ? To lubricate the vagina for sex. ? To protect the vagina from infection.  If the lining of the vagina is dry and thin, it may:  Make sex painful. It may also cause bleeding.  Cause a feeling of: ? Burning. ? Irritation. ? Itchiness.  Make an exam of your vagina painful. It may also cause bleeding.  Make you lose interest in sex.  Cause a burning feeling when you pee.  Make your vaginal fluid (discharge) brown or yellow.  For some women, there are no symptoms. This condition is most common in women who do not get their regular menstrual periods anymore (menopause). This often starts when a woman is 64-70 years old. Follow these instructions at home:  Take medicines only as told by your doctor. Do not use any herbal or alternative medicines unless your doctor says it is okay.  Use over-the-counter products for dryness only as told by your doctor. These include: ? Creams. ? Lubricants. ? Moisturizers.  Do not douche.  Do not use products that can make your vagina dry. These include: ? Scented feminine sprays. ? Scented tampons. ? Scented soaps.  If it hurts to have sex, tell your sexual partner. Contact a doctor if:  Your discharge looks different than normal.  Your vagina has an unusual smell.  You have new symptoms.  Your symptoms do not get better with treatment.  Your symptoms get worse. This information is not intended to replace advice given  to you by your health care provider. Make sure you discuss any questions you have with your health care provider. Document Released: 03/27/2008 Document Revised: 03/16/2016 Document Reviewed: 09/30/2014 Elsevier Interactive Patient Education  [employer].

## 2017-11-07 ENCOUNTER — Other Ambulatory Visit: Payer: Self-pay

## 2017-11-07 LAB — URINALYSIS, MICROSCOPIC ONLY
Casts: NONE SEEN /lpf
EPITHELIAL CELLS (NON RENAL): NONE SEEN /HPF (ref 0–10)

## 2017-11-07 LAB — VAGINITIS/VAGINOSIS, DNA PROBE
CANDIDA SPECIES: NEGATIVE
Gardnerella vaginalis: POSITIVE — AB
Trichomonas vaginosis: NEGATIVE

## 2017-11-07 MED ORDER — METRONIDAZOLE 0.75 % VA GEL: VAGINAL | 0 refills | Status: DC

## 2017-11-09 ENCOUNTER — Other Ambulatory Visit: Payer: Self-pay | Admitting: Certified Nurse Midwife

## 2017-11-09 DIAGNOSIS — N390 Urinary tract infection, site not specified: Secondary | ICD-10-CM

## 2017-11-09 DIAGNOSIS — R319 Hematuria, unspecified: Principal | ICD-10-CM

## 2017-11-09 LAB — URINE CULTURE

## 2017-11-09 MED ORDER — CIPROFLOXACIN HCL 500 MG PO TABS: ORAL_TABLET | ORAL | 0 refills | Status: DC

## 2017-11-29 ENCOUNTER — Ambulatory Visit: Payer: Medicare HMO | Admitting: Certified Nurse Midwife

## 2018-02-27 ENCOUNTER — Ambulatory Visit: Payer: Medicare HMO | Admitting: Obstetrics and Gynecology

## 2018-03-20 ENCOUNTER — Encounter: Payer: Self-pay | Admitting: Obstetrics and Gynecology

## 2018-03-20 ENCOUNTER — Other Ambulatory Visit (HOSPITAL_COMMUNITY)
Admission: RE | Admit: 2018-03-20 | Discharge: 2018-03-20 | Disposition: A | Discharge status: Home / Self Care | Payer: Medicare HMO | Source: Ambulatory Visit | Attending: Obstetrics and Gynecology | Admitting: Obstetrics and Gynecology

## 2018-03-20 ENCOUNTER — Ambulatory Visit (INDEPENDENT_AMBULATORY_CARE_PROVIDER_SITE_OTHER): Payer: Medicare HMO | Admitting: Obstetrics and Gynecology

## 2018-03-20 ENCOUNTER — Other Ambulatory Visit: Payer: Self-pay

## 2018-03-20 VITALS — BP 112/60 | HR 72 | Resp 16 | Ht 66.25 in | Wt 147.0 lb

## 2018-03-20 DIAGNOSIS — R350 Frequency of micturition: Secondary | ICD-10-CM | POA: Diagnosis not present

## 2018-03-20 DIAGNOSIS — Z01419 Encounter for gynecological examination (general) (routine) without abnormal findings: Secondary | ICD-10-CM | POA: Insufficient documentation

## 2018-03-20 LAB — POCT URINALYSIS DIPSTICK
BILIRUBIN UA: NEGATIVE
Glucose, UA: NEGATIVE
KETONES UA: NEGATIVE
Nitrite, UA: NEGATIVE
PROTEIN UA: NEGATIVE
Urobilinogen, UA: 0.2 E.U./dL
pH, UA: 7 (ref 5.0–8.0)

## 2018-03-20 NOTE — Progress Notes (Signed)
62 y.o. G42P1011 Widowed Caucasian female here for annual exam. Patient complains of having frequent and urgent urination for a few weeks.   Can have urinary leakage prior to getting to the bathroom.  Drinks at least a 6 pack of [facility] per day.   No new partner.  Seen earlier this year due to bleeding with intercourse.  Dx with BV and tx with Metrogel.  Started using coconut oil, and this stopped.  Denies spontaneous vaginal bleeding.   Labs with PCP.   PCP: Angelina Ok, PA-C    Patient's last menstrual period was 10/23/1980.           Sexually active: Yes.    The current method of family planning is status post hysterectomy.    Exercising: Yes.    walking Smoker:  Yes, 1ppd  Health Maintenance: Pap:  02/23/17 Pap and HR HPV negative History of abnormal Pap:  Yes, 02/17/16 - negative and negative HR HPV; Pap 02/12/15 - negative and negative HR HPV;09-23-14 [place]:Pos HR HPV;02-05-14 HGSIL;may include CIN 2 or CIN 3 or CIS--colposcopy revealed multifocal areas of the cuff with acetowhite change. 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  [facility].  Colonoscopy:  03/07/13 Polyp removed.  Due in 2024.  BMD:   04/18/17  Result  Osteopenia of forearm.  TDaP:  PCP Gardasil:   n/a HIV: 08/08/16 Negative Hep C: 02/17/16 Negative Screening Labs:  PCP   reports that she has been smoking cigarettes.  She has a 40.00 pack-year smoking history. She has never used smokeless tobacco. She reports that she does not drink alcohol or use drugs.  Past Medical History:  Diagnosis Date  . Anxiety   . Arthritis    hands and Knees  . Avascular necrosis of bones of both hips ([place]) 01/17/2012  . Benign essential HTN 01/17/2012  . Cancer ([place]) 1982   ovarian  . Cancer of cervix ([place])   . Constipation   . Depression    Takes Klonopin  . Depression with anxiety 01/17/2012  . GERD (gastroesophageal reflux disease)   . Hypercholesteremia   . Hypertension     takes meds  . Hypothyroidism    on Levonthyroxine  . Hypothyroidism (acquired) 01/17/2012   Since age 60  . Osteopenia 2018   forearm  . Ovarian cyst 01/17/2012   1995  BSO  . Personal history of colonic polyps 11/07/2007  . Pneumonia    x 2- last time > 5 years ago  . Pneumonia 01/17/2012   10 yrs ago and 5 years ago   . Rash and other nonspecific skin eruption 01/17/2012   Dorsum hands & extensor surfaces arms; intermittent    Past Surgical History:  Procedure Laterality Date  . ABDOMINAL HYSTERECTOMY  1982   ovaries retained--for CIS  . CELIAC ARTERY BYPASS  09/25/2012   Procedure: CELIAC ARTERY BYPASS;  Surgeon: Elam Dutch, MD;  Location: [facility] OR;  Service: Vascular;  Laterality: N/A;  Bypass using Hemashield Gold Vascular Graft 63mm x 71mm x 40cm  . COLONOSCOPY  2009   [place]-polyps, 5 yr FU  . JOINT REPLACEMENT    . MESENTERIC ARTERY BYPASS  09/25/2012   Procedure: MESENTERIC ARTERY BYPASS;  Surgeon: Elam Dutch, MD;  Location[facility];  Service: Vascular;  Laterality: N/A;  Bypass using Hemashield Gold Vascular Graft 24mm x 47mm x 40cm  . OVARIAN CYST REMOVAL  1997   -BSO - mucinous tumor of low  malignant potential  . TOTAL HIP ARTHROPLASTY  09/06/2011   Procedure: TOTAL HIP ARTHROPLASTY;  Surgeon: Ninetta Lights, MD;  Location: [place];  Service: Orthopedics;  Laterality: Left;  TOTAL HIP ARTHROPLASTY LEFT SIDE  . TOTAL HIP ARTHROPLASTY  10/11/2011   Procedure: TOTAL HIP ARTHROPLASTY;  Surgeon: Ninetta Lights, MD;  Location: [place];  Service: Orthopedics;  Laterality: Right;  120 MINUTES FOR THIS SURGERY  . WOUND EXPLORATION  10/02/2012   Procedure: WOUND EXPLORATION;  Surgeon: Elam Dutch, MD;  Location: [place];  Service: Vascular;  Laterality: N/A;  abdominal wound exploration    Current Outpatient Medications  Medication Sig Dispense Refill  . ADVAIR DISKUS 250-50 MCG/DOSE AEPB Inhale 1 puff into the lungs daily as needed (for shortness of breath).     .  ARIPiprazole (ABILIFY) 10 MG tablet Take by mouth. Takes 1/2 daily    . CALCIUM PO Take 1 tablet by mouth daily.    . clonazePAM (KLONOPIN) 0.5 MG tablet Take 1 mg by mouth 2 (two) times daily.     . hydrochlorothiazide (HYDRODIURIL) 25 MG tablet Take 12.5 mg by mouth daily.    Marland Kitchen levothyroxine (SYNTHROID, LEVOTHROID) 100 MCG tablet Take 1 tablet by mouth daily.    . Multiple Vitamins-Minerals (MULTIVITAMINS THER. W/MINERALS) TABS tablet Take 1 tablet by mouth daily.    Marland Kitchen venlafaxine (EFFEXOR-XR) 150 MG 24 hr capsule Take 300 mg by mouth daily with breakfast.      No current facility-administered medications for this visit.     Family History  Problem Relation Age of Onset  . Breast cancer Sister 68       dec age 26  . Cancer Sister   . Cancer Mother        lung  . Heart failure Father   . Hypertension Son   . Ovarian cancer Paternal 28        dec age 56  . Heart attack Brother 9  . Anesthesia problems Neg Hx   . Hypotension Neg Hx   . Malignant hyperthermia Neg Hx   . Pseudochol deficiency Neg Hx   . Colon cancer Neg Hx   . Esophageal cancer Neg Hx   . Rectal cancer Neg Hx   . Stomach cancer Neg Hx     Review of Systems  Constitutional: Negative.   HENT: Negative.   Eyes: Negative.   Respiratory: Negative.   Cardiovascular: Negative.   Gastrointestinal: Negative.   Endocrine: Negative.   Genitourinary: Positive for frequency and urgency.  Musculoskeletal: Negative.   Skin: Negative.   Allergic/Immunologic: Negative.   Neurological: Negative.   Hematological: Negative.   Psychiatric/Behavioral: Negative.     Exam:   BP 112/60 (BP Location: Right Arm, Patient Position: Sitting, Cuff Size: Normal)   Pulse 72   Resp 16   Ht 5' 6.25" (1.683 m)   Wt 147 lb (66.7 kg)   LMP 10/23/1980 Comment: Hyst age 54(ovaries retained)  BMI 23.55 kg/m     General appearance: alert, cooperative and appears stated age Head: Normocephalic, without obvious abnormality,  atraumatic Neck: no adenopathy, supple, symmetrical, trachea midline and thyroid normal to inspection and palpation Lungs: clear to auscultation bilaterally Breasts: normal appearance, no masses or tenderness, No nipple retraction or dimpling, No nipple discharge or bleeding, No axillary or supraclavicular adenopathy Heart: regular rate and rhythm Abdomen: soft, non-tender; no masses, no organomegaly Extremities: extremities normal, atraumatic, no cyanosis or edema Skin: Skin color, texture, turgor normal. No rashes or  lesions Lymph nodes: Cervical, supraclavicular, and axillary nodes normal. No abnormal inguinal nodes palpated Neurologic: Grossly normal  Pelvic: External genitalia:  no lesions              Urethra:  normal appearing urethra with no masses, tenderness or lesions              Bartholins and Skenes: normal                 Vagina: normal appearing vagina with normal color and discharge, no lesions.              Cervix:  absent              Pap taken: Yes.   Bimanual Exam:  Uterus:   absent              Adnexa: no mass, fullness, tenderness              Rectal exam: Yes.  .  Confirms.              Anus:  normal sphincter tone, no lesions  Chaperone was present for exam.  Assessment:   Well woman visit with normal exam. Former ERT patient.  Status post total vaginal hysterectomy for CIS.  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 Status post Effudex.  FH of ovarian cancer in paternal grandmother and breast cancer in sister. Menopausal symptoms.  Hx mesenteric ischemia and bypass surgery. Smoker.  Urinary frequency.  Heavy use of caffeine soft drinks.  Osteopenia.   Plan: Mammogram screening.  She will schedule.   We discussed 3D. Recommended self breast awareness. Pap and HR HPV as above. Guidelines for Calcium, Vitamin D, regular exercise program including cardiovascular and weight bearing exercise. Urine micro and culture.   Wean off [employer].  Declines assistance for smoking cessation.  BMD in next 1 - 2 years.  Follow up annually and prn.    After visit summary provided.

## 2018-03-20 NOTE — Patient Instructions (Signed)

## 2018-03-21 ENCOUNTER — Other Ambulatory Visit: Payer: Self-pay | Admitting: Obstetrics and Gynecology

## 2018-03-21 DIAGNOSIS — Z1231 Encounter for screening mammogram for malignant neoplasm of breast: Secondary | ICD-10-CM

## 2018-03-21 LAB — URINALYSIS, MICROSCOPIC ONLY: CASTS: NONE SEEN /LPF

## 2018-03-22 ENCOUNTER — Telehealth: Payer: Self-pay | Admitting: Obstetrics and Gynecology

## 2018-03-22 LAB — CYTOLOGY - PAP: HPV (WINDOPATH): NOT DETECTED

## 2018-03-22 LAB — URINE CULTURE

## 2018-03-22 MED ORDER — NITROFURANTOIN MONOHYD MACRO 100 MG PO CAPS: 100.0000 mg | ORAL_CAPSULE | Freq: Two times a day (BID) | ORAL | 0 refills | Status: DC

## 2018-03-22 NOTE — Telephone Encounter (Signed)
Phone call to patient regarding UC showing Klebsiella.  Rx for Macrobid 100 mg po bid x 5 days sent to her pharmacy.

## 2018-03-29 ENCOUNTER — Telehealth: Payer: Self-pay

## 2018-03-29 NOTE — Telephone Encounter (Signed)
Spoke with patient. Results given. Patient verbalizes understanding. Vaginal colposcopy scheduled for 04/08/18 at 10 am with Dr.Silva. Pre procedure instructions given.  Motrin instructions given. Motrin=Advil=Ibuprofen, 800 mg one hour before appointment. Eat a meal and hydrate well before appointment.  Patient verbalizes understanding.  Routing to provider for final review. Patient agreeable to disposition. Will close encounter.

## 2018-03-29 NOTE — Telephone Encounter (Signed)
-----   Message from Nunzio Cobbs, MD sent at 03/25/2018  1:38 PM EDT ----- Please contact patient regarding pap results.  She has LGSIL and she needs a colposcopy with me.  She has a long history of dysplasia and is status post hysterectomy for CIS.   I contacted her during the weekend and started her on Macrobid for her UTI.  I hope she is doing better with her bladder symptoms.

## 2018-04-03 ENCOUNTER — Other Ambulatory Visit: Payer: Self-pay

## 2018-04-03 DIAGNOSIS — R87622 Low grade squamous intraepithelial lesion on cytologic smear of vagina (LGSIL): Secondary | ICD-10-CM

## 2018-04-08 ENCOUNTER — Ambulatory Visit: Payer: Medicare HMO | Admitting: Obstetrics and Gynecology

## 2018-04-08 ENCOUNTER — Other Ambulatory Visit: Payer: Self-pay

## 2018-04-08 ENCOUNTER — Encounter: Payer: Self-pay | Admitting: Obstetrics and Gynecology

## 2018-04-08 DIAGNOSIS — R87622 Low grade squamous intraepithelial lesion on cytologic smear of vagina (LGSIL): Secondary | ICD-10-CM

## 2018-04-08 NOTE — Progress Notes (Signed)
  Subjective:     Patient ID: Dorothy [name], female   DOB: Mar 15, 1956, 62 y.o.   MRN: [id number]  HPI  Pap History: 03/20/18 LGSIL; Neg HR HPV 02/23/17 Pap and HR HPV negative 02/17/16 - negative and negative HR HPV; Pap 02/12/15 - negative and negative HR HPV;09-23-14 Ascus:Pos HR HPV;02-05-14 HGSIL;may include CIN 2 or CIN 3 or CIS--colposcopy revealed multifocal areas of the cuff with acetowhite change. 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  Consent for procedure.  3% acetic acid used.  Atrophy noted throughout.  Petechiae developed during exam.  Cervix absent.  Lugol's placed.  Decreased uptake in a patch left vaginal apex and bx taken and sent to pathology.  Decreased uptake throughout posterior vaginal wall and bx taken in midportion and sent to pathology. Monsel's placed.  Minimal EBL.  No complications.      Assessment:     Status post hysterectomy.  Hx VAIN II.  LGSIL pap and negative HR HPV currently.     Plan:     FU biopsy results.  Final plan to follow.  I anticipate pap and HR HRV in one year.   After visit summary to patient.

## 2018-04-08 NOTE — Patient Instructions (Signed)
Colposcopy, Care After  This sheet gives you information about how to care for yourself after your procedure. Your doctor may also give you more specific instructions. If you have problems or questions, contact your doctor.  What can I expect after the procedure?  If you did not have a tissue sample removed (did not have a biopsy), you may only have some spotting for a few days. You can go back to your normal activities.  If you had a tissue sample removed, it is common to have:  · Soreness and pain. This may last for a few days.  · Light-headedness.  · Mild bleeding from your vagina or dark-colored, grainy discharge from your vagina. This may last for a few days. You may need to wear a sanitary pad.  · Spotting for at least 48 hours after the procedure.    Follow these instructions at home:  · Take over-the-counter and prescription medicines only as told by your doctor. Ask your doctor what medicines you can start taking again. This is very important if you take blood-thinning medicine.  · Do not drive or use heavy machinery while taking prescription pain medicine.  · For 3 days, or as long as your doctor tells you, avoid:  ? Douching.  ? Using tampons.  ? Having sex.  · If you use birth control (contraception), keep using it.  · Limit activity for the first day after the procedure. Ask your doctor what activities are safe for you.  · It is up to you to get the results of your procedure. Ask your doctor when your results will be ready.  · Keep all follow-up visits as told by your doctor. This is important.  Contact a doctor if:  · You get a skin rash.  Get help right away if:  · You are bleeding a lot from your vagina. It is a lot of bleeding if you are using more than one pad an hour for 2 hours in a row.  · You have clumps of blood (blood clots) coming from your vagina.  · You have a fever.  · You have chills  · You have pain in your lower belly (pelvic area).  · You have signs of infection, such as vaginal  discharge that is:  ? Different than usual.  ? Yellow.  ? Bad-smelling.  · You have very pain or cramps in your lower belly that do not get better with medicine.  · You feel light-headed.  · You feel dizzy.  · You pass out (faint).  Summary  · If you did not have a tissue sample removed (did not have a biopsy), you may only have some spotting for a few days. You can go back to your normal activities.  · If you had a tissue sample removed, it is common to have mild pain and spotting for 48 hours.  · For 3 days, or as long as your doctor tells you, avoid douching, using tampons and having sex.  · Get help right away if you have bleeding, very bad pain, or signs of infection.  This information is not intended to replace advice given to you by your health care provider. Make sure you discuss any questions you have with your health care provider.  Document Released: 03/27/2008 Document Revised: 06/28/2016 Document Reviewed: 06/28/2016  Elsevier Interactive Patient Education © 2018 Elsevier Inc.

## 2018-04-10 ENCOUNTER — Ambulatory Visit
Admission: RE | Admit: 2018-04-10 | Discharge: 2018-04-10 | Disposition: A | Discharge status: Home / Self Care | Payer: Medicare HMO | Source: Ambulatory Visit | Attending: Obstetrics and Gynecology | Admitting: Obstetrics and Gynecology

## 2018-04-10 DIAGNOSIS — Z1231 Encounter for screening mammogram for malignant neoplasm of breast: Secondary | ICD-10-CM

## 2018-05-13 ENCOUNTER — Other Ambulatory Visit: Payer: Self-pay

## 2018-05-13 DIAGNOSIS — K551 Chronic vascular disorders of intestine: Secondary | ICD-10-CM

## 2018-05-13 DIAGNOSIS — K55059 Acute (reversible) ischemia of intestine, part and extent unspecified: Secondary | ICD-10-CM

## 2018-05-30 ENCOUNTER — Ambulatory Visit (HOSPITAL_COMMUNITY)
Admission: RE | Admit: 2018-05-30 | Discharge: 2018-05-30 | Disposition: A | Discharge status: Home / Self Care | Payer: Medicare HMO | Source: Ambulatory Visit | Attending: Family | Admitting: Family

## 2018-05-30 ENCOUNTER — Ambulatory Visit (INDEPENDENT_AMBULATORY_CARE_PROVIDER_SITE_OTHER): Payer: Medicare HMO | Admitting: Family

## 2018-05-30 ENCOUNTER — Encounter: Payer: Self-pay | Admitting: Family

## 2018-05-30 ENCOUNTER — Other Ambulatory Visit: Payer: Self-pay

## 2018-05-30 VITALS — BP 133/85 | HR 68 | Resp 18 | Ht 66.25 in | Wt 144.5 lb

## 2018-05-30 DIAGNOSIS — R109 Unspecified abdominal pain: Secondary | ICD-10-CM | POA: Diagnosis present

## 2018-05-30 DIAGNOSIS — F17200 Nicotine dependence, unspecified, uncomplicated: Secondary | ICD-10-CM | POA: Diagnosis not present

## 2018-05-30 DIAGNOSIS — K55059 Acute (reversible) ischemia of intestine, part and extent unspecified: Secondary | ICD-10-CM | POA: Diagnosis not present

## 2018-05-30 DIAGNOSIS — K551 Chronic vascular disorders of intestine: Secondary | ICD-10-CM | POA: Diagnosis not present

## 2018-05-30 NOTE — Progress Notes (Signed)
CC: Follow up Mesenteric Ischemia   History of Present Illness  Dorothy [name] is a 62 y.o. (1956-07-17) female who presents for followup today.  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 Oneida Alar.  Her weight has stayed the same. She denies any pain with eating.  Unfortunately she continues to smoke 1 pack of cigarettes per day.   Dr. Oneida Alar last evaluated pt on 05-17-17. At that time mesenteric duplex exam showed no increased velocities in the bypass graft and both limbs to the celiac and superior mesenteric artery widely patent. Pt was doing well status post aortomesenteric bypass. Dr. Oneida Alar advised follow-up in 1 year with nurse practitioner and a mesenteric ultrasound. She was to follow-up sooner if she is losing weight or has post prandial abdominal pain.  She denies chest pain. She denies shortness of breath.  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  She denies claudication type symptoms with walking, denies any known history of stroke or TIA.   Diabetic: no Tobacco use: 1 ppd, started in her teens  She is not taking a statin, states her cholesterol has been ok.   Past Medical History:  Diagnosis Date  . Anxiety   . Arthritis    hands and Knees  . Avascular necrosis of bones of both hips ([place]) 01/17/2012  . Benign essential HTN 01/17/2012  . Cancer ([place]) 1982   ovarian  . Cancer of cervix ([place])   . Constipation   . Depression    Takes Klonopin  . Depression with anxiety 01/17/2012  . GERD (gastroesophageal reflux disease)   . Hypercholesteremia   . Hypertension    takes meds  . Hypothyroidism    on Levonthyroxine  . Hypothyroidism (acquired) 01/17/2012   Since age 70  . Osteopenia 2018   forearm  . Ovarian cyst 01/17/2012   1995  BSO  . Personal history of colonic polyps 11/07/2007  . Pneumonia    x 2- last time > 5 years ago  .  Pneumonia 01/17/2012   10 yrs ago and 5 years ago   . Rash and other nonspecific skin eruption 01/17/2012   D[place]um hands & extensor surfaces arms; intermittent    Social History Social History   Tobacco Use  . Smoking status: Current Every Day Smoker    Packs/day: 1.00    Years: 40.00    Pack years: 40.00    Types: Cigarettes  . Smokeless tobacco: Never Used  . Tobacco comment: pt states that she wants to quit but needs help was advised to call 1-800-QUITNOW and to speak with PCP about avaliable aids  Substance Use Topics  . Alcohol use: No    Alcohol/week: 0.0 standard drinks  . Drug use: No    Family History Family History  Problem Relation Age of Onset  . Breast cancer Sister 51       dec age 36  . Cancer Sister   . Cancer Mother        lung  . Heart failure Father   . Hypertension Son   . Ovarian cancer Paternal 6        dec age 87  . Heart attack Brother 7  . Anesthesia problems Neg Hx   . Hypotension Neg Hx   . Malignant hyperthermia Neg Hx   . Pseudochol deficiency Neg Hx   . Colon cancer Neg Hx   .  Esophageal cancer Neg Hx   . Rectal cancer Neg Hx   . Stomach cancer Neg Hx     Surgical History Past Surgical History:  Procedure Laterality Date  . ABDOMINAL HYSTERECTOMY  1982   ovaries retained--for CIS  . CELIAC ARTERY BYPASS  09/25/2012   Procedure: CELIAC ARTERY BYPASS;  Surgeon: Elam Dutch, MD;  Location[facility] OR;  Service: Vascular;  Laterality: N/A;  Bypass using Hemashield Gold Vascular Graft 7mm x 68mm x 40cm  . COLONOSCOPY  2009   [place]-polyps, 5 yr FU  . JOINT REPLACEMENT    . MESENTERIC ARTERY BYPASS  09/25/2012   Procedure: MESENTERIC ARTERY BYPASS;  Surgeon: Elam Dutch, MD;  Location: [facility] OR;  Service: Vascular;  Laterality: N/A;  Bypass using Hemashield Gold Vascular Graft 87mm x 56mm x 40cm  . 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 TOTAL HIP ARTHROPLASTY  09/06/2011    Procedure: TOTAL HIP ARTHROPLASTY;  Surgeon: Ninetta Lights, MD;  Location: [facility];  Service: Orthopedics;  Laterality: Left;  TOTAL HIP ARTHROPLASTY LEFT SIDE  . TOTAL HIP ARTHROPLASTY  10/11/2011   Procedure: TOTAL HIP ARTHROPLASTY;  Surgeon: Ninetta Lights, MD;  Location: [facility];  Service: Orthopedics;  Laterality: Right;  120 MINUTES FOR THIS SURGERY  . WOUND EXPLORATION  10/02/2012   Procedure: WOUND EXPLORATION;  Surgeon: Elam Dutch, MD;  Location: [facility] OR;  Service: Vascular;  Laterality: N/A;  abdominal wound exploration    Allergies  Allergen Reactions  . Sulfa Antibiotics Rash    Current Outpatient Medications  Medication Sig Dispense Refill  . ADVAIR DISKUS 250-50 MCG/DOSE AEPB Inhale 1 puff into the lungs daily as needed (for shortness of breath).     . ARIPiprazole (ABILIFY) 10 MG tablet Take by mouth. Takes 1/2 daily    . CALCIUM PO Take 1 tablet by mouth daily.    . clonazePAM (KLONOPIN) 0.5 MG tablet Take 1 mg by mouth 2 (two) times daily.     . hydrochlorothiazide (HYDRODIURIL) 25 MG tablet Take 12.5 mg by mouth daily.    Marland Kitchen levothyroxine (SYNTHROID, LEVOTHROID) 100 MCG tablet Take 1 tablet by mouth daily.    . Multiple Vitamins-Minerals (MULTIVITAMINS THER. W/MINERALS) TABS tablet Take 1 tablet by mouth daily.    Marland Kitchen venlafaxine (EFFEXOR-XR) 150 MG 24 hr capsule Take 300 mg by mouth daily with breakfast.      No current facility-administered medications for this visit.     ROS: see HPI for pertinent positives and negatives    Physical Examination  Vitals:   05/30/18 0922  BP: 133/85  Pulse: 68  Resp: 18  SpO2: 99%  Weight: 144 lb 8 oz (65.5 kg)  Height: 5' 6.25" (1.683 m)   Body mass index is 23.15 kg/m.  General: A&O x 3, WDWN, female Gait: normal HEENT: no gross abnormalities  Pulmonary: Respirations are non labored, limited air movement in all fields, CTAB, no rales or rhonchi, + wheezing. Occasional dry cough. Cardiac: Regular rate and rhythm, no  detected murmur.  Vascular: Vessel Right Left  Radial 1+Palpable 1+Palpable  Brachial Palpable Palpable  Carotid  without bruit  without bruit  Aorta Not palpable N/A  Femoral 2+Palpable Not Palpable  Popliteal 2+ palpable Not palpable  PT 2+Palpable Not Palpable  DP Not Palpable Not Palpable   Gastrointestinal: soft, NTND, -G/R, - HSM, - masses, - CVAT. Musculoskeletal: M/S 5/5 throughout, Extremities without ischemic changes. Neurologic: Pain  and light touch intact in extremities, Motor exam as listed above. CN 2-12 intact.  Skin: No rash, no cellulitis, no ulcers noted. Tanned.  Psychiatric: Normal thought content, mood appropriate to clinical situation.    Non-Invasive Vascular Imaging  Mesenteric Duplex (Date: 05/30/2018):   Ao: 77 cm/s PSV  Celiac artery: 54 cm/s  SMA origin: 283 cm/s (was 173 cm/s on 05-17-17)  SMA proximal: 225 cm/s  SMA mid: 132 cm/s  SMA distal: 127 cm/s  Increased stenosis in the proximal SMA compared to a year ago.   Medical Decision Making  DARNELL STIMSON is a 62 y.o. 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 This was done for mesenteric ischemia.  She remains symptom free: has no post prandial abdominal pain, no food fear, her weight remains stable.   Her primary atherosclerotic risk factor remains her continued smoking since her teens.  Fortunately she does not have DM. Over 3 minutes was spent counseling patient re smoking cessation, and patient was given several free resources re smoking cessation.   She is not taking any antiplatelet agents nor a statin. Consider adding one or both to reduce her risk of CVD events, will defer to her PCP.    Based on her exam and studies, I have offered the patient return in 1 year for mesenteric duplex.  I advised her to notify us if she develops pain in her abdomen after eating, fear of eating, or weight loss.   I advised pt to notify us  if she develops abdominal pain after eating or fear of eating.   I discussed in depth with the patient the nature of atherosclerosis, and emphasized the importance of maximal medical management including strict control of blood pressure, blood glucose, and lipid levels, obtaining regular exercise, and cessation of smoking.    The patient is aware that without maximal medical management the underlying atherosclerotic disease process will progress, limiting the benefit of any interventions.  Thank you for allowing [place] to participate in this patient's care.  Clemon Chambers, RN, MSN, FNP-C [facility] Office: [phone]  Clinic MD: Oneida Alar  05/30/2018, 9:51 AM

## 2018-05-30 NOTE — Patient Instructions (Addendum)
Before your next abdominal ultrasound:  Avoid gas forming foods and beverages the day before the test.   Take two Extra-Strength Gas-X capsules at bedtime the night before the test. Take another two Extra-Strength Gas-X capsules in the middle of the night if you get up to the restroom, if not, first thing in the morning with water.  Do not chew gum.      Steps to Quit Smoking Smoking tobacco can be bad for your health. It can also affect almost every organ in your body. Smoking puts you and people around you at risk for many serious long-lasting (chronic) diseases. Quitting smoking is hard, but it is one of the best things that you can do for your health. It is never too late to quit. What are the benefits of quitting smoking? When you quit smoking, you lower your risk for getting serious diseases and conditions. They can include:  Lung cancer or lung disease.  Heart disease.  Stroke.  Heart attack.  Not being able to have children (infertility).  Weak bones (osteoporosis) and broken bones (fractures).  If you have coughing, wheezing, and shortness of breath, those symptoms may get better when you quit. You may also get sick less often. If you are pregnant, quitting smoking can help to lower your chances of having a baby of low birth weight. What can I do to help me quit smoking? Talk with your doctor about what can help you quit smoking. Some things you can do (strategies) include:  Quitting smoking totally, instead of slowly cutting back how much you smoke over a period of time.  Going to in-person counseling. You are more likely to quit if you go to many counseling sessions.  Using resources and support systems, such as: ? Database administrator with a Social worker. ? Phone quitlines. ? Careers information officer. ? Support groups or group counseling. ? Text messaging programs. ? Mobile phone apps or applications.  Taking medicines. Some of these medicines may have nicotine in them.  If you are pregnant or breastfeeding, do not take any medicines to quit smoking unless your doctor says it is okay. Talk with your doctor about counseling or other things that can help you.  Talk with your doctor about using more than one strategy at the same time, such as taking medicines while you are also going to in-person counseling. This can help make quitting easier. What things can I do to make it easier to quit? Quitting smoking might feel very hard at first, but there is a lot that you can do to make it easier. Take these steps:  Talk to your family and friends. Ask them to support and encourage you.  Call phone quitlines, reach out to support groups, or work with a Social worker.  Ask people who smoke to not smoke around you.  Avoid places that make you want (trigger) to smoke, such as: ? Bars. ? Parties. ? Smoke-break areas at work.  Spend time with people who do not smoke.  Lower the stress in your life. Stress can make you want to smoke. Try these things to help your stress: ? Getting regular exercise. ? Deep-breathing exercises. ? Yoga. ? Meditating. ? Doing a body scan. To do this, close your eyes, focus on one area of your body at a time from head to toe, and notice which parts of your body are tense. Try to relax the muscles in those areas.  Download or buy apps on your mobile phone or tablet that  can help you stick to your quit plan. There are many free apps, such as QuitGuide from the [employer] Office manager for Disease Control and Prevention). You can find more support from smokefree.gov and other websites.  This information is not intended to replace advice given to you by your health care provider. Make sure you discuss any questions you have with your health care provider. Document Released: 08/05/2009 Document Revised: 06/06/2016 Document Reviewed: 02/23/2015 Elsevier Interactive Patient Education  2018 [employer].     Chronic Mesenteric Ischemia Mesenteric ischemia  is poor blood flow (circulation) in the vessels that supply blood to the stomach, intestines, and liver (mesenteric organs). Chronic mesenteric ischemia, also called mesenteric angina or intestinal angina, is a long-term (chronic) condition. It happens when an artery or vein that provides blood to the mesenteric organs gradually becomes blocked or narrow, restricting the blood supply to the organs. When the blood supply is severely restricted, the mesenteric organs cannot work properly. What are the causes? This condition is commonly caused by fatty deposits that build up in an artery (plaque), which can narrow the artery and restrict blood flow. Other causes include:  Weakened areas in blood vessel walls (aneurysms).  Conditions that cause twisting or inflammation of blood vessels, such as fibromuscular dysplasia or arteritis.  A disorder in which blood clots form in the veins (venous thrombosis).  Scarring and thickening (fibrosis) of blood vessels caused by radiation therapy.  A tear in the aorta, the body's main artery (aortic dissection).  Blood vessel problems after illegal drug use, such as use of cocaine.  Tumors in the nervous system (neurofibromatosis).  Certain autoimmune diseases, such as lupus.  What increases the risk? The following factors may make you more likely to develop this condition:  Being female.  Being over age 5, especially if you have a history of heart problems.  Smoking.  Congestive heart failure.  Irregular heartbeat (arrhythmia).  Having a history of heart attack or stroke.  Diabetes.  High cholesterol.  High blood pressure (hypertension).  Being overweight or obese.  Kidney disease (renal disease) requiring dialysis.  What are the signs or symptoms? Symptoms of this condition include:  Abdomen (abdominal) pain or cramps that develop 15-60 minutes after a meal. This pain may last for 1-3 hours. Some people may develop a fear of eating  because of this symptom.  Weight loss.  Diarrhea.  Bloody stool.  Nausea.  Vomiting.  Bloating.  Abdominal pain after stress or with exercise.  How is this diagnosed? This condition is diagnosed based on:  Your medical history.  A physical exam.  Tests, such as: ? Ultrasound. ? CT scan. ? Blood tests. ? Urine tests. ? An imaging test that involves injecting a dye into your arteries to show blood flow through blood vessels (angiogram). This can help to show if there are any blockages in the vessels that lead to the intestines. ? Passing a small probe through the mouth and into the stomach to measure the output of carbon dioxide (gastric tonometry). This can help to indicate whether there is decreased blood flow to the stomach and intestines.  How is this treated? This condition may be treated with:  Dietary changes such as eating smaller, low-fat, meals more frequently.  Lifestyle changes to treat underlying conditions that contribute to the disease, such as high cholesterol and high blood pressure.  Medicines to reduce blood clotting and increase blood flow.  Surgery to remove the blockage, repair arteries or veins, and restore blood  flow. This may involve: ? Angioplasty. This is surgery to widen the affected artery, reduce the blockage, and sometimes insert a small, mesh tube (stent). ? Bypass surgery. This may be done to go around (bypass) the blockage and reconnect healthy arteries or veins. ? Placing a stent in the affected area. This may be done to help keep blocked arteries open.  Follow these instructions at home: Eating and drinking  Eat a heart-healthy diet. This includes fresh fruits and vegetables, whole grains, and lean proteins like chicken, fish, eggs, and beans.  Avoid foods that contain a lot of: ? Salt (sodium). ? Sugar. ? Saturated fat (such as red meat). ? Trans fat (such as fried foods).  Stay hydrated. Drink enough fluid to keep your urine  clear or pale yellow. Lifestyle  Stay active and get regular exercise as told by your health care provider. Aim for 150 minutes of moderate activity or 75 minutes of vigorous activity a week. Ask your health care provider what activities and forms of exercise are safe for you.  Maintain a healthy weight.  Work with your health care provider to manage your cholesterol.  Manage any other health problems you have, such as high blood pressure, diabetes, or heart rhythm problems.  Do not use any products that contain nicotine or tobacco, such as cigarettes and e-cigarettes. If you need help quitting, ask your health care provider. General instructions  Take over-the-counter and prescription medicines only as told by your health care provider.  Keep all follow-up visits as told by your health care provider. This is important. Contact a health care provider if:  Your symptoms do not improve or they return after treatment.  You have a fever. Get help right away if:  You have severe abdominal pain.  You have severe chest pain.  You have shortness of breath.  You feel weak or dizzy.  You have palpitations.  You have numbness or weakness in your face, arm, or leg.  You are confused.  You have trouble speaking or people have trouble understanding what you are saying.  You are constipated.  You have trouble urinating.  You have blood in your stool.  You have severe nausea, vomiting, or persistent diarrhea. Summary  Mesenteric ischemia is poor circulation in the vessels that supply blood to the the stomach, intestines, and liver (mesenteric organs).  This condition happens when an artery or vein that provides blood to the mesenteric organs gradually becomes blocked or narrow, restricting the blood supply to the organs.  This condition is commonly caused by fatty deposits that build up in an artery (plaque), which can narrow the artery and restrict blood flow.  You are more  likely to develop this condition if you are over age 1 and have a history of heart problems, high blood pressure, diabetes, or high cholesterol.  This condition is usually treated with medicines, dietary and lifestyle changes, and surgery to remove the blockage, repair arteries or veins, and restore blood flow. This information is not intended to replace advice given to you by your health care provider. Make sure you discuss any questions you have with your health care provider. Document Released: 05/29/2011 Document Revised: 09/23/2016 Document Reviewed: 09/23/2016 Elsevier Interactive Patient Education  2017 [employer].

## 2018-07-31 ENCOUNTER — Other Ambulatory Visit (HOSPITAL_COMMUNITY): Payer: Self-pay | Admitting: Nurse Practitioner

## 2018-07-31 DIAGNOSIS — R9389 Abnormal findings on diagnostic imaging of other specified body structures: Secondary | ICD-10-CM

## 2018-08-19 ENCOUNTER — Ambulatory Visit (HOSPITAL_COMMUNITY)
Admission: RE | Admit: 2018-08-19 | Discharge: 2018-08-19 | Disposition: A | Discharge status: Home / Self Care | Payer: Medicare HMO | Source: Ambulatory Visit | Attending: Nurse Practitioner | Admitting: Nurse Practitioner

## 2018-08-19 DIAGNOSIS — I2510 Atherosclerotic heart disease of native coronary artery without angina pectoris: Secondary | ICD-10-CM | POA: Insufficient documentation

## 2018-08-19 DIAGNOSIS — R911 Solitary pulmonary nodule: Secondary | ICD-10-CM | POA: Diagnosis not present

## 2018-08-19 DIAGNOSIS — R9389 Abnormal findings on diagnostic imaging of other specified body structures: Secondary | ICD-10-CM

## 2018-08-19 DIAGNOSIS — J432 Centrilobular emphysema: Secondary | ICD-10-CM | POA: Diagnosis not present

## 2019-04-15 ENCOUNTER — Other Ambulatory Visit: Payer: Self-pay

## 2019-04-16 ENCOUNTER — Ambulatory Visit (INDEPENDENT_AMBULATORY_CARE_PROVIDER_SITE_OTHER): Payer: Medicare HMO | Admitting: Obstetrics and Gynecology

## 2019-04-16 ENCOUNTER — Other Ambulatory Visit (HOSPITAL_COMMUNITY)
Admission: RE | Admit: 2019-04-16 | Discharge: 2019-04-16 | Disposition: A | Discharge status: Home / Self Care | Payer: Medicare HMO | Source: Ambulatory Visit | Attending: Obstetrics and Gynecology | Admitting: Obstetrics and Gynecology

## 2019-04-16 ENCOUNTER — Encounter: Payer: Self-pay | Admitting: Obstetrics and Gynecology

## 2019-04-16 VITALS — BP 104/60 | HR 84 | Temp 97.7°F | Resp 14 | Ht 67.0 in | Wt 166.0 lb

## 2019-04-16 DIAGNOSIS — Z01419 Encounter for gynecological examination (general) (routine) without abnormal findings: Secondary | ICD-10-CM

## 2019-04-16 DIAGNOSIS — Z780 Asymptomatic menopausal state: Secondary | ICD-10-CM

## 2019-04-16 DIAGNOSIS — M8580 Other specified disorders of bone density and structure, unspecified site: Secondary | ICD-10-CM | POA: Diagnosis not present

## 2019-04-16 NOTE — Patient Instructions (Signed)

## 2019-04-16 NOTE — Progress Notes (Signed)
63 y.o. G2P1011 Widowed Caucasian female here for annual exam.    Her voiding is interrupted.  No dysuria or hematuria.  No leakage of urine.  6 pack of [place] per day.  Bowel function is [name].   20 pound weight gain since last visit.  Feeling tired.   Still smoking.  Had nightmares with Chantix.   Labs last week with PCP.  She was told her thyroid was off and they will review to see if she needs to make any changes in her Synthroid.   Still sitting with an elderly gentleman.   PCP: Dorothy Ok, NP    Patient's last menstrual period was 10/23/1980.           Sexually active: Yes.    The current method of family planning is status post hysterectomy.    Exercising: Yes.    walking Smoker:  yes  Health Maintenance: Pap: 04/08/18 Colpo biopsies were both benign 03/20/18 LGSIL; Neg HR HPV 02/23/17 Pap and HR HPV negative 02/17/16 - negative and negative HR HPV; Pap 02/12/15 - negative and negative HR HPV;09-23-14 Ascus:Pos HR HPV;02-05-14 HGSIL;may include CIN 2 or CIN 3 or CIS--colposcopy revealed multifocal areas of the cuff with acetowhite change. 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  She will schedule.  Colonoscopy:  03/07/13 Polyp removed.  Due in 2024 BMD:   04/18/17  Result  Osteopenia of forearm TDaP:  unsure HIV: 08/08/16 Negative Hep C: 02/17/16 Negative Screening Labs:  PCP   reports that she has been smoking cigarettes. She has a 40.00 pack-year smoking history. She has never used smokeless tobacco. She reports that she does not drink alcohol or use drugs.  Past Medical History:  Diagnosis Date  . Anxiety   . Arthritis    hands and Knees  . Avascular necrosis of bones of both hips ([place]) 01/17/2012  . Benign essential HTN 01/17/2012  . Cancer ([place]) 1982   ovarian  . Cancer of cervix ([place])   . Constipation   . Depression    Takes Klonopin  . Depression with anxiety 01/17/2012  . GERD  (gastroesophageal reflux disease)   . Hypercholesteremia   . Hypertension    takes meds  . Hypothyroidism    on Levonthyroxine  . Hypothyroidism (acquired) 01/17/2012   Since age 26  . Osteopenia 2018   forearm  . Ovarian cyst 01/17/2012   1995  BSO  . Personal history of colonic polyps 11/07/2007  . Pneumonia    x 2- last time > 5 years ago  . Pneumonia 01/17/2012   10 yrs ago and 5 years ago   . Rash and other nonspecific skin eruption 01/17/2012   Dorsum hands & extensor surfaces arms; intermittent    Past Surgical History:  Procedure Laterality Date  . ABDOMINAL HYSTERECTOMY  1982   ovaries retained--for CIS  . CELIAC ARTERY BYPASS  09/25/2012   Procedure: CELIAC ARTERY BYPASS;  Surgeon: Elam Dutch, MD;  Location: [facility] OR;  Service: Vascular;  Laterality: N/A;  Bypass using Hemashield Gold Vascular Graft 44mm x 50mm x 40cm  . COLONOSCOPY  2009   [place]-polyps, 5 yr FU  . JOINT REPLACEMENT    . MESENTERIC ARTERY BYPASS  09/25/2012   Procedure: MESENTERIC ARTERY BYPASS;  Surgeon: Elam Dutch, MD;  Location: [facility] OR;  Service: Vascular;  Laterality: N/A;  Bypass using Hemashield Gold Vascular Graft 73mm x 64mm x 40cm  .  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 TOTAL HIP ARTHROPLASTY  09/06/2011   Procedure: TOTAL HIP ARTHROPLASTY;  Surgeon: Ninetta Lights, MD;  Location: [place];  Service: Orthopedics;  Laterality: Left;  TOTAL HIP ARTHROPLASTY LEFT SIDE  . TOTAL HIP ARTHROPLASTY  10/11/2011   Procedure: TOTAL HIP ARTHROPLASTY;  Surgeon: Ninetta Lights, MD;  Location: [place];  Service: Orthopedics;  Laterality: Right;  120 MINUTES FOR THIS SURGERY  . WOUND EXPLORATION  10/02/2012   Procedure: WOUND EXPLORATION;  Surgeon: Elam Dutch, MD;  Location: [place];  Service: Vascular;  Laterality: N/A;  abdominal wound exploration    Current Outpatient Medications  Medication Sig Dispense Refill  . ADVAIR DISKUS 250-50 MCG/DOSE AEPB  Inhale 1 puff into the lungs daily as needed (for shortness of breath).     . ARIPiprazole (ABILIFY) 10 MG tablet Take by mouth. Takes 1/2 daily    . CALCIUM PO Take 1 tablet by mouth daily.    . clonazePAM (KLONOPIN) 0.5 MG tablet Take 1 mg by mouth 2 (two) times daily.     . hydrochlorothiazide (HYDRODIURIL) 25 MG tablet Take 12.5 mg by mouth daily.    Marland Kitchen levothyroxine (SYNTHROID, LEVOTHROID) 100 MCG tablet Take 1 tablet by mouth daily.    . Multiple Vitamins-Minerals (MULTIVITAMINS THER. W/MINERALS) TABS tablet Take 1 tablet by mouth daily.    Marland Kitchen venlafaxine (EFFEXOR-XR) 150 MG 24 hr capsule Take 300 mg by mouth daily with breakfast.     . zolpidem (AMBIEN) 10 MG tablet      No current facility-administered medications for this visit.     Family History  Problem Relation Age of Onset  . Breast cancer Sister 88       dec age 40  . Cancer Sister   . Cancer Mother        lung  . Heart failure Father   . Hypertension Son   . Ovarian cancer Paternal 23        dec age 7  . Heart attack Brother 33  . Anesthesia problems Neg Hx   . Hypotension Neg Hx   . Malignant hyperthermia Neg Hx   . Pseudochol deficiency Neg Hx   . Colon cancer Neg Hx   . Esophageal cancer Neg Hx   . Rectal cancer Neg Hx   . Stomach cancer Neg Hx     Review of Systems  Constitutional: Negative.   HENT: Negative.   Eyes: Negative.   Respiratory: Negative.   Cardiovascular: Negative.   Gastrointestinal: Negative.   Endocrine: Negative.   Genitourinary: Negative.   Musculoskeletal: Negative.   Skin: Negative.   Allergic/Immunologic: Negative.   Neurological: Negative.   Hematological: Negative.   Psychiatric/Behavioral: Negative.     Exam:   BP 104/60 (BP Location: Right Arm, Patient Position: Sitting, Cuff Size: Normal)   Pulse 84   Temp 97.7 F (36.5 C) (Temporal)   Resp 14   Ht 5\' 7"  (1.702 m)   Wt 166 lb (75.3 kg)   LMP 10/23/1980 Comment: Hyst age 33(ovaries retained)  BMI 26.00  kg/m     General appearance: alert, cooperative and appears stated age Head: normocephalic, without obvious abnormality, atraumatic Neck: no adenopathy, supple, symmetrical, trachea midline and thyroid normal to inspection and palpation Lungs: clear to auscultation bilaterally Breasts: normal appearance, no masses or tenderness, No nipple retraction or dimpling, No nipple discharge or bleeding, No axillary adenopathy Heart: regular rate  and rhythm Abdomen: soft, non-tender; no masses, no organomegaly Extremities: extremities normal, atraumatic, no cyanosis or edema Skin: skin color, texture, turgor normal. No rashes or lesions Lymph nodes: cervical, supraclavicular, and axillary nodes normal. Neurologic: grossly normal  Pelvic: External genitalia:  no lesions              No abnormal inguinal nodes palpated.              Urethra:  normal appearing urethra with no masses, tenderness or lesions              Bartholins and Skenes: normal                 Vagina: normal appearing vagina with normal color and discharge, no lesions              Cervix: absent              Pap taken: Yes.   Bimanual Exam:  Uterus:  absent              Adnexa: no mass, fullness, tenderness              Rectal exam: Yes.  .  Confirms.              Anus:  normal sphincter tone, no lesions  Chaperone was present for exam.  Assessment:   Well woman visit with normal exam. Former ERT patient.  Status post total vaginal hysterectomy for CIS.  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 Status post Effudex. LGSIL pap last year with normal colpo biopsy. FH of ovarian cancer in paternal grandmother and breast cancer in sister. Hx mesenteric ischemia and bypass surgery. Smoker. Urinary hesitancy. Heavy use of caffeine soft drinks.  Osteopenia.  Hypothyroidism.  Fatigue and weight gain.  Plan: Mammogram screening discussed. Self breast awareness reviewed. Pap and HR HPV as  above. Guidelines for Calcium, Vitamin D, regular exercise program including cardiovascular and weight bearing exercise. BMD this year with mammogram. We discussed nicotine replacement and reduction of caffeine.  She will contact her PCP regarding her thyroid function.  Follow up annually and prn.   After visit summary provided.

## 2019-04-18 ENCOUNTER — Other Ambulatory Visit: Payer: Self-pay | Admitting: Obstetrics and Gynecology

## 2019-04-18 DIAGNOSIS — Z1231 Encounter for screening mammogram for malignant neoplasm of breast: Secondary | ICD-10-CM

## 2019-04-18 LAB — CYTOLOGY - PAP
Diagnosis: NEGATIVE
HPV: NOT DETECTED

## 2019-07-04 ENCOUNTER — Ambulatory Visit
Admission: RE | Admit: 2019-07-04 | Discharge: 2019-07-04 | Disposition: A | Discharge status: Home / Self Care | Payer: Medicare HMO | Source: Ambulatory Visit | Attending: Obstetrics and Gynecology | Admitting: Obstetrics and Gynecology

## 2019-07-04 ENCOUNTER — Other Ambulatory Visit: Payer: Self-pay

## 2019-07-04 DIAGNOSIS — Z1231 Encounter for screening mammogram for malignant neoplasm of breast: Secondary | ICD-10-CM

## 2019-07-04 DIAGNOSIS — Z780 Asymptomatic menopausal state: Secondary | ICD-10-CM

## 2019-07-04 DIAGNOSIS — M8580 Other specified disorders of bone density and structure, unspecified site: Secondary | ICD-10-CM

## 2019-07-06 ENCOUNTER — Encounter: Payer: Self-pay | Admitting: Obstetrics and Gynecology

## 2020-01-13 ENCOUNTER — Encounter: Payer: Self-pay | Admitting: Certified Nurse Midwife

## 2020-05-03 NOTE — Progress Notes (Deleted)
64 y.o. G82P1011 Widowed Caucasian female here for annual exam.    PCP:     Patient's last menstrual period was 10/23/1980.           Sexually active: {yes no:314532}  The current method of family planning is status post hysterectomy.    Exercising: {yes no:314532}  {types:19826} Smoker:  {YES NO:22349}  Health Maintenance: Pap: 04-16-19 Neg:Neg HR HPV, 03-20-18 LGSIL:Neg HR HPV, 02-23-17 Neg:Neg HR HPV History of abnormal Pap:  Yes, 04/08/18 Colpo biopsies were both benign 03/20/18 LGSIL; Neg HR HPV 02/23/17 Pap and HR HPV negative 02/17/16 - negative and negative HR HPV; Pap 02/12/15 - negative and negative HR HPV;09-23-14 Ascus:Pos HR HPV;02-05-14 HGSIL;may include CIN 2 or CIN 3 or CIS--colposcopy revealed multifocal areas of the cuff with acetowhite change. 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 Due in 2024 BMD: 07-04-19 Result :Osteopenia TDaP: *** Gardasil:   no HIV: 08/08/16 Negative Hep C:  02/17/16 Negative Screening Labs:  Hb today: ***, Urine today: ***   reports that she has been smoking cigarettes. She has a 40.00 pack-year smoking history. She has never used smokeless tobacco. She reports that she does not drink alcohol and does not use drugs.  Past Medical History:  Diagnosis Date  . Anxiety   . Arthritis    hands and Knees  . Avascular necrosis of bones of both hips ([place]) 01/17/2012  . Benign essential HTN 01/17/2012  . Cancer ([place]) 1982   ovarian  . Cancer of cervix ([place])   . Constipation   . Depression    Takes Klonopin  . Depression with anxiety 01/17/2012  . GERD (gastroesophageal reflux disease)   . Hypercholesteremia   . Hypertension    takes meds  . Hypothyroidism    on Levonthyroxine  . Hypothyroidism (acquired) 01/17/2012   Since age 77  . Osteopenia 2018   forearm  . Ovarian cyst 01/17/2012   1995  BSO  . Personal history of colonic polyps 11/07/2007  . Pneumonia    x 2- last time >  5 years ago  . Pneumonia 01/17/2012   10 yrs ago and 5 years ago   . Rash and other nonspecific skin eruption 01/17/2012   Dorsum hands & extensor surfaces arms; intermittent    Past Surgical History:  Procedure Laterality Date  . ABDOMINAL HYSTERECTOMY  1982   ovaries retained--for CIS  . CELIAC ARTERY BYPASS  09/25/2012   Procedure: CELIAC ARTERY BYPASS;  Surgeon: Elam Dutch, MD;  Location: [facility] OR;  Service: Vascular;  Laterality: N/A;  Bypass using Hemashield Gold Vascular Graft 91mm x 81mm x 40cm  . COLONOSCOPY  2009   [place]-polyps, 5 yr FU  . JOINT REPLACEMENT    . MESENTERIC ARTERY BYPASS  09/25/2012   Procedure: MESENTERIC ARTERY BYPASS;  Surgeon: Elam Dutch, MD;  Location: [facility] OR;  Service: Vascular;  Laterality: N/A;  Bypass using Hemashield Gold Vascular Graft 77mm x 78mm x 40cm  . 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 TOTAL HIP ARTHROPLASTY  09/06/2011   Procedure: TOTAL HIP ARTHROPLASTY;  Surgeon: Ninetta Lights, MD;  Location: [place];  Service: Orthopedics;  Laterality: Left;  TOTAL HIP ARTHROPLASTY LEFT SIDE  . TOTAL HIP ARTHROPLASTY  10/11/2011   Procedure: TOTAL HIP ARTHROPLASTY;  Surgeon: Ninetta Lights, MD;  Location: [place];  Service: Orthopedics;  Laterality: Right;  120 MINUTES  FOR THIS SURGERY  . WOUND EXPLORATION  10/02/2012   Procedure: WOUND EXPLORATION;  Surgeon: Elam Dutch, MD;  Location: [employer];  Service: Vascular;  Laterality: N/A;  abdominal wound exploration    Current Outpatient Medications  Medication Sig Dispense Refill  . ADVAIR DISKUS 250-50 MCG/DOSE AEPB Inhale 1 puff into the lungs daily as needed (for shortness of breath).     . ARIPiprazole (ABILIFY) 10 MG tablet Take by mouth. Takes 1/2 daily    . CALCIUM PO Take 1 tablet by mouth daily.    . clonazePAM (KLONOPIN) 0.5 MG tablet Take 1 mg by mouth 2 (two) times daily.     . hydrochlorothiazide (HYDRODIURIL) 25 MG tablet Take 12.5 mg by  mouth daily.    Marland Kitchen levothyroxine (SYNTHROID, LEVOTHROID) 100 MCG tablet Take 1 tablet by mouth daily.    . Multiple Vitamins-Minerals (MULTIVITAMINS THER. W/MINERALS) TABS tablet Take 1 tablet by mouth daily.    Marland Kitchen venlafaxine (EFFEXOR-XR) 150 MG 24 hr capsule Take 300 mg by mouth daily with breakfast.     . zolpidem (AMBIEN) 10 MG tablet      No current facility-administered medications for this visit.    Family History  Problem Relation Age of Onset  . Breast cancer Sister 41       dec age 67  . Cancer Sister   . Cancer Mother        lung  . Heart failure Father   . Hypertension Son   . Ovarian cancer Paternal 34        dec age 30  . Heart attack Brother 70  . Anesthesia problems Neg Hx   . Hypotension Neg Hx   . Malignant hyperthermia Neg Hx   . Pseudochol deficiency Neg Hx   . Colon cancer Neg Hx   . Esophageal cancer Neg Hx   . Rectal cancer Neg Hx   . Stomach cancer Neg Hx     Review of Systems  Exam:   LMP 10/23/1980 Comment: Hyst age 64(ovaries retained)    General appearance: alert, cooperative and appears stated age Head: normocephalic, without obvious abnormality, atraumatic Neck: no adenopathy, supple, symmetrical, trachea midline and thyroid normal to inspection and palpation Lungs: clear to auscultation bilaterally Breasts: normal appearance, no masses or tenderness, No nipple retraction or dimpling, No nipple discharge or bleeding, No axillary adenopathy Heart: regular rate and rhythm Abdomen: soft, non-tender; no masses, no organomegaly Extremities: extremities normal, atraumatic, no cyanosis or edema Skin: skin color, texture, turgor normal. No rashes or lesions Lymph nodes: cervical, supraclavicular, and axillary nodes normal. Neurologic: grossly normal  Pelvic: External genitalia:  no lesions              No abnormal inguinal nodes palpated.              Urethra:  normal appearing urethra with no masses, tenderness or lesions               Bartholins and Skenes: normal                 Vagina: normal appearing vagina with normal color and discharge, no lesions              Cervix: no lesions              Pap taken: {yes no:314532} Bimanual Exam:  Uterus:  normal size, contour, position, consistency, mobility, non-tender              Adnexa:  no mass, fullness, tenderness              Rectal exam: {yes no:314532}.  Confirms.              Anus:  normal sphincter tone, no lesions  Chaperone was present for exam.  Assessment:   Well woman visit with normal exam.   Plan: Mammogram screening discussed. Self breast awareness reviewed. Pap and HR HPV as above. Guidelines for Calcium, Vitamin D, regular exercise program including cardiovascular and weight bearing exercise.   Follow up annually and prn.   Additional counseling given.  {yes [id number]. _______ minutes face to face time of which over 50% was spent in counseling.    After visit summary provided.

## 2020-05-05 ENCOUNTER — Encounter: Payer: Self-pay | Admitting: Obstetrics and Gynecology

## 2020-05-05 ENCOUNTER — Ambulatory Visit: Payer: Medicare HMO | Admitting: Obstetrics and Gynecology

## 2020-06-02 ENCOUNTER — Other Ambulatory Visit: Payer: Self-pay | Admitting: Obstetrics and Gynecology

## 2020-06-02 DIAGNOSIS — Z1231 Encounter for screening mammogram for malignant neoplasm of breast: Secondary | ICD-10-CM

## 2020-07-06 ENCOUNTER — Other Ambulatory Visit: Payer: Self-pay

## 2020-07-06 ENCOUNTER — Ambulatory Visit
Admission: RE | Admit: 2020-07-06 | Discharge: 2020-07-06 | Disposition: A | Discharge status: Home / Self Care | Payer: Medicare HMO | Source: Ambulatory Visit | Attending: Obstetrics and Gynecology | Admitting: Obstetrics and Gynecology

## 2020-07-06 DIAGNOSIS — Z1231 Encounter for screening mammogram for malignant neoplasm of breast: Secondary | ICD-10-CM

## 2020-07-13 NOTE — Progress Notes (Signed)
64 y.o. G62P1011 Widowed Caucasian female here for annual exam.    Denies vaginal bleeding, abdominal or pelvic pain.  No bladder or bowel concerns.  No leakage or urgency.  New partner.  Not using condoms.  On new medication for her blood pressure.   Still sitting with elderly people for work.   PCP: Angelina Ok, NP    Patient's last menstrual period was 10/23/1980.           Sexually active: Yes.    The current method of family planning is status post hysterectomy.    Exercising: No.  some walking Smoker: yes, 1ppd  Health Maintenance: Pap: 04-16-19 Neg:Neg HR HPV, 03-20-18 LGSIL:Neg HR HPV, 02-23-17 Neg:Neg HR HPV  History of abnormal Pap:  Yes, 04/08/18 Colpo biopsies of vagia benign.  03/20/18 pap LGSIL; Neg HR HPV. 09-23-14 pap Ascus:Pos HR HPV;02-05-14 pap HGSIL;may include CIN 2 or CIN 3 or CIS--colposcopy revealed multifocal areas of the cuff with acetowhite change. 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 MMG:  07-06-20  3D/Neg/density B/BiRads1 Colonoscopy:  03-07-13 polyp removed;next 2024. BMD: 07-04-19  Result :Osteopenia TDaP: PCP Gardasil:   no HIV: 08-08-16 NR Hep C:02-17-16 Neg Screening Labs:  PCP.    reports that she has been smoking cigarettes. She has a 40.00 pack-year smoking history. She has never used smokeless tobacco. She reports that she does not drink alcohol and does not use drugs.  Past Medical History:  Diagnosis Date   Anxiety    Arthritis    hands and Knees   Avascular necrosis of bones of both hips ([place]) 01/17/2012   Benign essential HTN 01/17/2012   Cancer ([place]) 1982   ovarian   Cancer of cervix ([place])    Constipation    Depression    Takes Klonopin   Depression with anxiety 01/17/2012   GERD (gastroesophageal reflux disease)    Hypercholesteremia    Hypertension    takes meds   Hypothyroidism    on Levonthyroxine   Hypothyroidism (acquired) 01/17/2012   Since age 6   Osteopenia 2018   forearm   Ovarian cyst 01/17/2012    1995  BSO   Personal history of colonic polyps 11/07/2007   Pneumonia    x 2- last time > 5 years ago   Pneumonia 01/17/2012   10 yrs ago and 5 years ago    Rash and other nonspecific skin eruption 01/17/2012   Dorsum hands & extensor surfaces arms; intermittent    Past Surgical History:  Procedure Laterality Date   ABDOMINAL HYSTERECTOMY  1982   ovaries retained--for CIS   CELIAC ARTERY BYPASS  09/25/2012   Procedure: CELIAC ARTERY BYPASS;  Surgeon: Elam Dutch, MD;  Location: [place];  Service: Vascular;  Laterality: N/A;  Bypass using Hemashield Gold Vascular Graft 30mm x 50mm x 40cm   COLONOSCOPY  2009   [place]-polyps, 5 yr FU   JOINT REPLACEMENT     MESENTERIC ARTERY BYPASS  09/25/2012   Procedure: MESENTERIC ARTERY BYPASS;  Surgeon: Elam Dutch, MD;  Location: [place];  Service: Vascular;  Laterality: N/A;  Bypass using Hemashield Gold Vascular Graft 1mm x 14mm x 40cm   OVARIAN CYST REMOVAL  1997   -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   TOTAL HIP ARTHROPLASTY  09/06/2011   Procedure: TOTAL HIP ARTHROPLASTY;  Surgeon: Ninetta Lights, MD;  Location: [place];  Service: Orthopedics;  Laterality: Left;  TOTAL HIP ARTHROPLASTY LEFT SIDE   TOTAL HIP ARTHROPLASTY  10/11/2011   Procedure: TOTAL HIP ARTHROPLASTY;  Surgeon: Ninetta Lights, MD;  Location: [place];  Service: Orthopedics;  Laterality: Right;  120 MINUTES FOR THIS SURGERY   WOUND EXPLORATION  10/02/2012   Procedure: WOUND EXPLORATION;  Surgeon: Elam Dutch, MD;  [facility];  Service: Vascular;  Laterality: N/A;  abdominal wound exploration    Current Outpatient Medications  Medication Sig Dispense Refill   ADVAIR DISKUS 250-50 MCG/DOSE AEPB Inhale 1 puff into the lungs daily as needed (for shortness of breath).      ARIPiprazole (ABILIFY) 10 MG tablet Take by mouth. Takes 1/2 daily     CALCIUM PO Take 1 tablet by mouth daily.     clonazePAM (KLONOPIN) 0.5 MG tablet Take 1 mg by  mouth 2 (two) times daily.      hydrochlorothiazide (HYDRODIURIL) 25 MG tablet Take 12.5 mg by mouth daily.     ketorolac (ACULAR) 0.5 % ophthalmic solution      levothyroxine (SYNTHROID) 125 MCG tablet Take 125 mcg by mouth daily.     lisinopril (ZESTRIL) 20 MG tablet Take 20 mg by mouth daily.     moxifloxacin (VIGAMOX) 0.5 % ophthalmic solution      Multiple Vitamins-Minerals (MULTIVITAMINS THER. W/MINERALS) TABS tablet Take 1 tablet by mouth daily.     prednisoLONE acetate (PRED FORTE) 1 % ophthalmic suspension      venlafaxine (EFFEXOR-XR) 150 MG 24 hr capsule Take 300 mg by mouth daily with breakfast.      zolpidem (AMBIEN) 10 MG tablet      No current facility-administered medications for this visit.  Taking Vit D 2 tabs daily.   Family History  Problem Relation Age of Onset   Breast cancer Sister 34       dec age 24   Cancer Sister    Cancer Mother        lung   Heart failure Father    Hypertension Son    Ovarian cancer Paternal Grandmother        dec age 72   Heart attack Brother 80   Anesthesia problems Neg Hx    Hypotension Neg Hx    Malignant hyperthermia Neg Hx    Pseudochol deficiency Neg Hx    Colon cancer Neg Hx    Esophageal cancer Neg Hx    Rectal cancer Neg Hx    Stomach cancer Neg Hx     Review of Systems  All other systems reviewed and are negative.   Exam:   BP (!) 160/82    Pulse 80    Resp 14    Ht 5\' 6"  (1.676 m)    Wt 168 lb (76.2 kg)    LMP 10/23/1980 Comment: Hyst age 60(ovaries retained)   BMI 27.12 kg/m     General appearance: alert, cooperative and appears stated age Head: normocephalic, without obvious abnormality, atraumatic Neck: no adenopathy, supple, symmetrical, trachea midline and thyroid normal to inspection and palpation Lungs: clear to auscultation bilaterally Breasts: normal appearance, no masses or tenderness, No nipple retraction or dimpling, No nipple discharge or bleeding, No axillary  adenopathy Heart: regular rate and rhythm Abdomen: vertical midline incision, abdomen is soft, non-tender; no masses, no organomegaly Extremities: extremities normal, atraumatic, no cyanosis or edema Skin: skin color, texture, turgor normal. No rashes or lesions Lymph nodes: cervical, supraclavicular, and axillary nodes normal. Neurologic: grossly normal  Pelvic: External genitalia:  no lesions  No abnormal inguinal nodes palpated.              Urethra:  normal appearing urethra with no masses, tenderness or lesions              Bartholins and Skenes: normal                 Vagina: normal appearing vagina with normal color and discharge, no lesions              Cervix: absent              Pap taken: Yes.   Bimanual Exam:  Uterus:  absent              Adnexa: no mass, fullness, tenderness              Rectal exam: Yes.  .  Confirms.              Anus:  normal sphincter tone, no lesions.  Mild erythema around anal opening.   Chaperone was present for exam.  Assessment:   Well woman visit with normal exam. Former ERT patient.  Status post total vaginal hysterectomy for CIS.  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 Status post Effudex. LGSIL pap last year with normal colpo biopsy. FH of ovarian cancer in paternal grandmother and breast cancer in sister. Sister had genetic testing but patient is unaware of the results.  Hx mesenteric ischemia and bypass surgery. Smoker. Osteopenia. Hypothyroidism.    Plan: Mammogram screening discussed. Self breast awareness reviewed. Pap and HR HPV as above. Guidelines for Calcium, Vitamin D, regular exercise program including cardiovascular and weight bearing exercise. STD screening. BMD next year.  We discussed genetic counseling and testing, and she declines.  She declines smoking cessation today.  We reviewed benefits on heart, lung, brain and bone for smoking cessation.  Follow up  annually and prn.  After visit summary provided.

## 2020-07-14 ENCOUNTER — Other Ambulatory Visit: Payer: Self-pay

## 2020-07-14 ENCOUNTER — Other Ambulatory Visit (HOSPITAL_COMMUNITY)
Admission: RE | Admit: 2020-07-14 | Discharge: 2020-07-14 | Disposition: A | Discharge status: Home / Self Care | Payer: Medicare HMO | Source: Ambulatory Visit | Attending: Obstetrics and Gynecology | Admitting: Obstetrics and Gynecology

## 2020-07-14 ENCOUNTER — Ambulatory Visit (INDEPENDENT_AMBULATORY_CARE_PROVIDER_SITE_OTHER): Payer: Medicare HMO | Admitting: Obstetrics and Gynecology

## 2020-07-14 ENCOUNTER — Encounter: Payer: Self-pay | Admitting: Obstetrics and Gynecology

## 2020-07-14 VITALS — BP 160/82 | HR 80 | Resp 14 | Ht 66.0 in | Wt 168.0 lb

## 2020-07-14 DIAGNOSIS — Z780 Asymptomatic menopausal state: Secondary | ICD-10-CM

## 2020-07-14 DIAGNOSIS — Z8619 Personal history of other infectious and parasitic diseases: Secondary | ICD-10-CM | POA: Diagnosis not present

## 2020-07-14 DIAGNOSIS — Z113 Encounter for screening for infections with a predominantly sexual mode of transmission: Secondary | ICD-10-CM | POA: Diagnosis not present

## 2020-07-14 DIAGNOSIS — Z1151 Encounter for screening for human papillomavirus (HPV): Secondary | ICD-10-CM | POA: Insufficient documentation

## 2020-07-14 DIAGNOSIS — Z01419 Encounter for gynecological examination (general) (routine) without abnormal findings: Secondary | ICD-10-CM

## 2020-07-14 NOTE — Patient Instructions (Signed)

## 2020-07-15 LAB — HEP, RPR, HIV PANEL
HIV Screen 4th Generation wRfx: NONREACTIVE
Hepatitis B Surface Ag: NEGATIVE
RPR Ser Ql: NONREACTIVE

## 2020-07-15 LAB — HEPATITIS C ANTIBODY: Hep C Virus Ab: <0.1 s/co ratio (ref 0.0–0.9)

## 2020-07-19 ENCOUNTER — Telehealth: Payer: Self-pay

## 2020-07-19 DIAGNOSIS — R87612 Low grade squamous intraepithelial lesion on cytologic smear of cervix (LGSIL): Secondary | ICD-10-CM

## 2020-07-19 LAB — CYTOLOGY - PAP
Chlamydia: NEGATIVE
Comment: NEGATIVE
Comment: NEGATIVE
Comment: NEGATIVE
Comment: NORMAL
High risk HPV: NEGATIVE
Neisseria Gonorrhea: NEGATIVE
Trichomonas: NEGATIVE

## 2020-07-19 NOTE — Telephone Encounter (Signed)
-----   Message from Nunzio Cobbs, MD sent at 07/18/2020  4:04 PM EDT ----- Please contact patient with results of her blood work which is negative for HIV, syphilis, and hepatitis B and C.   The remaining testing is still in process.   I am highlighting this result so you know to contact the patient.

## 2020-07-19 NOTE — Telephone Encounter (Signed)
Patient is returning call.  °

## 2020-07-19 NOTE — Telephone Encounter (Signed)
Left message to call Samba Cumba, CMA. °

## 2020-07-19 NOTE — Telephone Encounter (Signed)
Spoke with Dorothy [name]. Dorothy [name] given results and recommendations per Dr Quincy Simmonds. Dorothy [name] verbalized understanding.   Pap smear results are back. Dorothy [name] aware of review by Dr Quincy Simmonds and a return call. Dorothy [name] agreeable.   Routing to Dr Quincy Simmonds. Please advise.

## 2020-07-22 NOTE — Telephone Encounter (Signed)
Spoke with pt. Pt given results and recommendations per Dr Quincy Simmonds. Pt agreeable and verbalized understanding. Given pt brief explanation of Colpo procedure. Pt advised to take Motrin 800 mg with food and water one hour before procedure. Pt agreeable.   Pt scheduled for Colpo with Dr Quincy Simmonds on 08/04/20 at 8 am. Pt agreeable and verbalized understanding of date and time of appt. Cancellation policy reviewed. Pt aware of call from business office for benefits.   Cc: Alfonse Spruce for precert  Orders placed.  Routing to Dr Quincy Simmonds for review. Encounter closed.

## 2020-07-22 NOTE — Telephone Encounter (Signed)
Patient's pap shows LGSIL.  Her testing is negative for HR HPV.  There is no sign of cancer.  I do recommend a colposcopy with me.  Please precert and schedule.  She tested negative for gonorrhea, chlamydia, and trichomonas.

## 2020-07-23 ENCOUNTER — Telehealth: Payer: Self-pay

## 2020-07-23 NOTE — Telephone Encounter (Signed)
Patient called wanting to reschedule procedure to to having eye surgery scheduled for the day before.

## 2020-07-23 NOTE — Telephone Encounter (Signed)
Call placed to convey benefits for colposcopy.Dorothy [name] Spoke with the patient and conveyed the benefits. Patient understands/agreeable with the benefits. Patient is aware of the cancellation policy. Appointment scheduled 08/04/20.

## 2020-07-23 NOTE — Telephone Encounter (Signed)
Spoke with pt. Pt states having cataract surgery on 10/12 and will not be able to keep Colpo appt with Dr Quincy Simmonds on 10/13 and will need to reschedule.  Pt scheduled for Colpo on 10/25 at 1130 am with Dr Quincy Simmonds. Pt agreeable to date and time of appt.  Encounter closed.

## 2020-08-04 ENCOUNTER — Ambulatory Visit: Payer: Self-pay | Admitting: Obstetrics and Gynecology

## 2020-08-12 NOTE — Progress Notes (Signed)
  Subjective:     Patient ID: Dorothy [name], female   DOB: 05/13/1956, 64 y.o.   MRN: [id number]  HPI  Patient here today for colposcopy with pap 07-14-20 LGSIL:Neg HR HPV.  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   LMP: Hyst Contraception: Hyst     Objective:   Physical Exam Genitourinary:     Colposcopy - vagina. Consent for procedure.  3% acetic acid and then Lugol's solution used in vagina. White light and green light filter used for acetic acid. Colposcopy satisfactory:  NA.  Cervix absent. Findings:    Vagina:  Islands of lesions at 6:00, 12:00, 4:00, and 11:00. Biopsies:   Of all the above islands done. Monsel's placed.  Minimal EBL. No complications.     Assessment:     LGSIL pap.  Status post hysterectomy for CIS.  Hx HGSIL of vagina.    Plan:     Fu biopsies.  Post colpo instructions to patient. Plan for follow up pap and HR HPV in one year if LGSIL.  If HGSIL, we discussed excision, Effudex, versus laser treatment.

## 2020-08-16 ENCOUNTER — Ambulatory Visit: Payer: Medicare HMO | Admitting: Obstetrics and Gynecology

## 2020-08-16 ENCOUNTER — Other Ambulatory Visit: Payer: Self-pay

## 2020-08-16 ENCOUNTER — Other Ambulatory Visit (HOSPITAL_COMMUNITY)
Admission: RE | Admit: 2020-08-16 | Discharge: 2020-08-16 | Disposition: A | Discharge status: Home / Self Care | Payer: Medicare HMO | Source: Ambulatory Visit | Attending: Obstetrics and Gynecology | Admitting: Obstetrics and Gynecology

## 2020-08-16 ENCOUNTER — Encounter: Payer: Self-pay | Admitting: Obstetrics and Gynecology

## 2020-08-16 VITALS — BP 148/82 | HR 68 | Ht 66.0 in | Wt 168.0 lb

## 2020-08-16 DIAGNOSIS — R87612 Low grade squamous intraepithelial lesion on cytologic smear of cervix (LGSIL): Secondary | ICD-10-CM | POA: Diagnosis not present

## 2020-08-16 DIAGNOSIS — R87622 Low grade squamous intraepithelial lesion on cytologic smear of vagina (LGSIL): Secondary | ICD-10-CM | POA: Insufficient documentation

## 2020-08-16 NOTE — Patient Instructions (Signed)

## 2020-08-19 LAB — SURGICAL PATHOLOGY

## 2020-08-25 ENCOUNTER — Ambulatory Visit: Payer: Medicare HMO | Admitting: Obstetrics and Gynecology

## 2020-08-25 ENCOUNTER — Other Ambulatory Visit: Payer: Self-pay

## 2020-08-25 ENCOUNTER — Encounter: Payer: Self-pay | Admitting: Obstetrics and Gynecology

## 2020-08-25 VITALS — BP 160/110 | HR 77 | Ht 66.0 in | Wt 164.0 lb

## 2020-08-25 DIAGNOSIS — N952 Postmenopausal atrophic vaginitis: Secondary | ICD-10-CM

## 2020-08-25 DIAGNOSIS — N890 Mild vaginal dysplasia: Secondary | ICD-10-CM

## 2020-08-25 MED ORDER — ESTRADIOL 10 MCG VA TABS: 1.0000 | ORAL_TABLET | VAGINAL | 12 refills | Status: DC

## 2020-08-25 NOTE — Patient Instructions (Signed)
Estradiol vaginal tablets What is this medicine? ESTRADIOL (es tra DYE ole) vaginal tablet is used to help relieve symptoms of vaginal irritation and dryness that occurs in some women during menopause. This medicine may be used for other purposes; ask your health care provider or pharmacist if you have questions. COMMON BRAND NAME(S): Vagifem What should I tell my health care provider before I take this medicine? They need to know if you have any of these conditions:  abnormal vaginal bleeding  blood vessel disease or blood clots  breast, cervical, endometrial, ovarian, liver, or uterine cancer  dementia  diabetes  gallbladder disease  heart disease or recent heart attack  high blood pressure  high cholesterol  high level of calcium in the blood  hysterectomy  kidney disease  liver disease  migraine headaches  protein C deficiency  protein S deficiency  stroke  systemic lupus erythematosus (SLE)  tobacco smoker  an unusual or allergic reaction to estrogens, other hormones, medicines, foods, dyes, or preservatives  pregnant or trying to get pregnant  breast-feeding How should I use this medicine? This medicine is only for use in the vagina. Do not take by mouth. Wash and dry your hands before and after use. Read package directions carefully. Unwrap the applicator package. Be sure to use a new applicator for each dose. Use at the same time each day. If the tablet has fallen out of the applicator, but is still in the package, carefully place it back into the applicator. If the tablet has fallen out of the package, that applicator should be thrown out and you should use a new applicator containing a new tablet. Lie on your back, part and bend your knees. Gently insert the applicator as far as comfortably possible into the vagina. Then, gently press the plunger until the plunger is fully depressed. This will release the tablet into the vagina. Gently remove the  applicator. Throw away the applicator after use. Do not use your medicine more often than directed. Do not stop using except on the advice of your doctor or health care professional. Talk to your pediatrician regarding the use of this medicine in children. This medicine is not approved for use in children. A patient package insert for the product will be given with each prescription and refill. Read this sheet carefully each time. The sheet may change frequently. Overdosage: If you think you have taken too much of this medicine contact a poison control center or emergency room at once. NOTE: This medicine is only for you. Do not share this medicine with others. What if I miss a dose? If you miss a dose, take it as soon as you can. If it is almost time for your next dose, take only that dose. Do not take double or extra doses. What may interact with this medicine? Do not take this medicine with any of the following medications:  aromatase inhibitors like aminoglutethimide, anastrozole, exemestane, letrozole, testolactone This medicine may also interact with the following medications:  antibiotics used to treat tuberculosis like rifabutin, rifampin and rifapentene  raloxifene or tamoxifen  warfarin This list may not describe all possible interactions. Give your health care provider a list of all the medicines, herbs, non-prescription drugs, or dietary supplements you use. Also tell them if you smoke, drink alcohol, or use illegal drugs. Some items may interact with your medicine. What should I watch for while using this medicine? Visit your health care professional for regular checks on your progress. You will need  a regular breast and pelvic exam. You should also discuss the need for regular mammograms with your health care professional, and follow his or her guidelines. This medicine can make your body retain fluid, making your fingers, hands, or ankles swell. Your blood pressure can go up.  Contact your doctor or health care professional if you feel you are retaining fluid. If you have any reason to think you are pregnant; stop taking this medicine at once and contact your doctor or health care professional. Tobacco smoking increases the risk of getting a blood clot or having a stroke, especially if you are more than 63 years old. You are strongly advised not to smoke. If you wear contact lenses and notice visual changes, or if the lenses begin to feel uncomfortable, consult your eye care specialist. If you are going to have elective surgery, you may need to stop taking this medicine beforehand. Consult your health care professional for advice prior to scheduling the surgery. What side effects may I notice from receiving this medicine? Side effects that you should report to your doctor or health care professional as soon as possible:  allergic reactions like skin rash, itching or hives, swelling of the face, lips, or tongue  breast tissue changes or discharge  changes in vision  chest pain  confusion, trouble speaking or understanding  dark urine  general ill feeling or flu-like symptoms  light-colored stools  nausea, vomiting  pain, swelling, warmth in the leg  right upper belly pain  severe headaches  shortness of breath  sudden numbness or weakness of the face, arm or leg  trouble walking, dizziness, loss of balance or coordination  unusual vaginal bleeding  yellowing of the eyes or skin Side effects that usually do not require medical attention (report to your doctor or health care professional if they continue or are bothersome):  hair loss  increased hunger or thirst  increased urination  symptoms of vaginal infection like itching, irritation or unusual discharge  unusually weak or tired This list may not describe all possible side effects. Call your doctor for medical advice about side effects. You may report side effects to FDA at  1-800-FDA-1088. Where should I keep my medicine? Keep out of the reach of children. Store at room temperature between 15 and 30 degrees C (59 and 86 degrees F). Throw away any unused medicine after the expiration date. NOTE: This sheet is a summary. It may not cover all possible information. If you have questions about this medicine, talk to your doctor, pharmacist, or health care provider.  2020 Elsevier/Gold Standard (2014-09-23 09:22:51)

## 2020-08-25 NOTE — Progress Notes (Signed)
GYNECOLOGY  VISIT   HPI: 64 y.o.   Widowed  Caucasian  female   [id number] with Dorothy [name]'s last menstrual period was 10/23/1980.   here for follow up after vaginal colposcopy.   Presents with her female friend to discuss test results.   Dorothy [name] had recent pap showing LGSIL and negative HR HPV.  She has a history of cervical and vaginal dysplasia and therefore underwent a colposcopy which showed LGSIL in multiple biopsies of the vagina.   Vagina is generally uncomfortable.  Intercourse painful.  GYNECOLOGIC HISTORY: Dorothy [name]'s last menstrual period was 10/23/1980. Contraception:  Hyst Menopausal hormone therapy:  None Last mammogram:  07-06-20  3D/Neg/density B/BiRads1 Last pap smear: 07-14-20 LGSIL:Neg HR HPV,  04-16-19 Neg:Neg HR HPV, 03-20-18 LGSIL:Neg HR HPV, 02-23-17 Neg:Neg HR HPV        OB History    Gravida  2   Para  1   Term  1   Preterm      AB  1   Living  1     SAB  1   TAB      Ectopic      Multiple      Live Births                 Dorothy [name] Active Problem List   Diagnosis Date Noted  . Pulmonary nodule 08/18/2016  . COPD with exacerbation ([place]) 08/18/2016  . 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 Acute respiratory failure with hypoxia ([place]) 09/25/2012  . Hyponatremia 07/10/2012  . Abdominal pain 07/10/2012  . Hematochezia 07/10/2012  . Avascular necrosis of bones of both hips ([place]) 01/17/2012  . Hypothyroidism (acquired) 01/17/2012  . Benign essential HTN 01/17/2012  . Depression with anxiety 01/17/2012  . Cancer of cervix ([place]) 01/17/2012  . Ovarian cyst 01/17/2012  . Community acquired pneumonia 01/17/2012  . 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 Rash and other nonspecific skin eruption 01/17/2012  . Personal history of colonic polyps 11/07/2007    Past Medical History:  Diagnosis Date  . Anxiety   . Arthritis    hands and Knees  . Avascular necrosis of bones of both hips ([place]) 01/17/2012  . Benign essential HTN  01/17/2012  . Cancer ([place]) 1982   ovarian  . Cancer of cervix ([place])   . Constipation   . Depression    Takes Klonopin  . Depression with anxiety 01/17/2012  . GERD (gastroesophageal reflux disease)   . Hypercholesteremia   . Hypertension    takes meds  . Hypothyroidism    on Levonthyroxine  . Hypothyroidism (acquired) 01/17/2012   Since age 45  . Osteopenia 2018   forearm  . Ovarian cyst 01/17/2012   1995  BSO  . Personal history of colonic polyps 11/07/2007  . Pneumonia    x 2- last time > 5 years ago  . Pneumonia 01/17/2012   10 yrs ago and 5 years ago   . Rash and other nonspecific skin eruption 01/17/2012   Dorsum hands & extensor surfaces arms; intermittent    Past Surgical History:  Procedure Laterality Date  . ABDOMINAL HYSTERECTOMY  1982   ovaries retained--for CIS  . CELIAC ARTERY BYPASS  09/25/2012   Procedure: CELIAC ARTERY BYPASS;  Surgeon: Elam Dutch, MD;  Location: [facility] OR;  Service: Vascular;  Laterality: N/A;  Bypass using Hemashield Gold Vascular Graft 65mm x 4mm x 40cm  . COLONOSCOPY  2009   [place]-polyps, 5 yr FU  . JOINT REPLACEMENT    .  MESENTERIC ARTERY BYPASS  09/25/2012   Procedure: MESENTERIC ARTERY BYPASS;  Surgeon: Elam Dutch, MD;  Location: [facility] OR;  Service: Vascular;  Laterality: N/A;  Bypass using Hemashield Gold Vascular Graft 74mm x 70mm x 40cm  . 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 TOTAL HIP ARTHROPLASTY  09/06/2011   Procedure: TOTAL HIP ARTHROPLASTY;  Surgeon: Ninetta Lights, MD;  Location: [place];  Service: Orthopedics;  Laterality: Left;  TOTAL HIP ARTHROPLASTY LEFT SIDE  . TOTAL HIP ARTHROPLASTY  10/11/2011   Procedure: TOTAL HIP ARTHROPLASTY;  Surgeon: Ninetta Lights, MD;  Location: [place];  Service: Orthopedics;  Laterality: Right;  120 MINUTES FOR THIS SURGERY  . WOUND EXPLORATION  10/02/2012   Procedure: WOUND EXPLORATION;  Surgeon: Elam Dutch, MD;  Location: [place];  Service:  Vascular;  Laterality: N/A;  abdominal wound exploration    Current Outpatient Medications  Medication Sig Dispense Refill  . ADVAIR DISKUS 250-50 MCG/DOSE AEPB Inhale 1 puff into the lungs daily as needed (for shortness of breath).     . ARIPiprazole (ABILIFY) 10 MG tablet Take by mouth. Takes 1/2 daily    . CALCIUM PO Take 1 tablet by mouth daily.    . clonazePAM (KLONOPIN) 0.5 MG tablet Take 1 mg by mouth 2 (two) times daily.     . hydrochlorothiazide (HYDRODIURIL) 25 MG tablet Take 12.5 mg by mouth daily.    Marland Kitchen ketorolac (ACULAR) 0.5 % ophthalmic solution     . levothyroxine (SYNTHROID) 125 MCG tablet Take 125 mcg by mouth daily.    Marland Kitchen lisinopril (ZESTRIL) 20 MG tablet Take 20 mg by mouth daily.    Marland Kitchen moxifloxacin (VIGAMOX) 0.5 % ophthalmic solution     . Multiple Vitamins-Minerals (MULTIVITAMINS THER. W/MINERALS) TABS tablet Take 1 tablet by mouth daily.    . prednisoLONE acetate (PRED FORTE) 1 % ophthalmic suspension     . venlafaxine (EFFEXOR-XR) 150 MG 24 hr capsule Take 300 mg by mouth daily with breakfast.     . zolpidem (AMBIEN) 10 MG tablet      No current facility-administered medications for this visit.     ALLERGIES: Sulfa antibiotics  Family History  Problem Relation Age of Onset  . Breast cancer Sister 36       dec age 28  . Cancer Sister   . Cancer Mother        lung  . Heart failure Father   . Hypertension Son   . Ovarian cancer Paternal 56        dec age 2  . Heart attack Brother 73  . Anesthesia problems Neg Hx   . Hypotension Neg Hx   . Malignant hyperthermia Neg Hx   . Pseudochol deficiency Neg Hx   . Colon cancer Neg Hx   . Esophageal cancer Neg Hx   . Rectal cancer Neg Hx   . Stomach cancer Neg Hx     Social History   Socioeconomic History  . Marital status: Widowed    Spouse name: Not on file  . Number of children: 1  . Years of education: Not on file  . Highest education level: Not on file  Occupational History  . Occupation:  disabled  Tobacco Use  . Smoking status: Current Every Day Smoker    Packs/day: 1.00    Years: 40.00    Pack years: 40.00    Types: Cigarettes  . Smokeless tobacco: Never Used  .  Tobacco comment: pt states that she wants to quit but needs help was advised to call 1-800-QUITNOW and to speak with PCP about avaliable aids  Vaping Use  . Vaping Use: Never used  Substance and Sexual Activity  . Alcohol use: No    Alcohol/week: 0.0 standard drinks  . Drug use: No  . Sexual activity: Yes    Partners: Male    Birth control/protection: Surgical    Comment: TVH/ovaries retained  Other Topics Concern  . Not on file  Social History Narrative  . Not on file   Social Determinants of Health   Financial Resource Strain:   . Difficulty of Paying Living Expenses: Not on file  Food Insecurity:   . Worried About Charity fundraiser in the Last Year: Not on file  . Ran Out of Food in the Last Year: Not on file  Transportation Needs:   . Lack of Transportation (Medical): Not on file  . Lack of Transportation (Non-Medical): Not on file  Physical Activity:   . Days of Exercise per Week: Not on file  . Minutes of Exercise per Session: Not on file  Stress:   . Feeling of Stress : Not on file  Social Connections:   . Frequency of Communication with Friends and Family: Not on file  . Frequency of Social Gatherings with Friends and Family: Not on file  . Attends Religious Services: Not on file  . Active Member of Clubs or Organizations: Not on file  . Attends Archivist Meetings: Not on file  . Marital Status: Not on file  Intimate Partner Violence:   . Fear of Current or Ex-Partner: Not on file  . Emotionally Abused: Not on file  . Physically Abused: Not on file  . Sexually Abused: Not on file    Review of Systems  All other systems reviewed and are negative.   PHYSICAL EXAMINATION:    BP (!) 160/110   Pulse 77   Ht 5\' 6"  (1.676 m)   Wt 164 lb (74.4 kg)   LMP 10/23/1980  Comment: Hyst age 14(ovaries retained)  SpO2 99%   BMI 26.47 kg/m     General appearance: alert, cooperative and appears stated age   ASSESSMENT  Colposcopic biopsy showing LGSIL.  Vaginal atrophy.   PLAN  We discussed her pathology report and that she does not have cancer of the vagina.  Options of treating vaginal atrophy reviewed: water based lubricants, cooking oils, and vaginal estrogen.  Vaginal estrogen cream, tablets and ring offered.  Potential effect on breast cancer reviewed.  She will start Vagifem.  Instructed in use.  Next pap and HR HPV testing in one year.  Questions invited and answered.  22 min  total time was spent for this Dorothy [name] encounter, including preparation, face-to-face counseling with the Dorothy [name], coordination of care, and documentation of the encounter.

## 2020-11-04 DIAGNOSIS — H2513 Age-related nuclear cataract, bilateral: Secondary | ICD-10-CM | POA: Insufficient documentation

## 2020-11-04 DIAGNOSIS — H211X1 Other vascular disorders of iris and ciliary body, right eye: Secondary | ICD-10-CM | POA: Insufficient documentation

## 2020-11-04 DIAGNOSIS — H30033 Focal chorioretinal inflammation, peripheral, bilateral: Secondary | ICD-10-CM | POA: Insufficient documentation

## 2020-11-04 DIAGNOSIS — H35033 Hypertensive retinopathy, bilateral: Secondary | ICD-10-CM | POA: Insufficient documentation

## 2020-11-04 DIAGNOSIS — H348311 Tributary (branch) retinal vein occlusion, right eye, with retinal neovascularization: Secondary | ICD-10-CM | POA: Insufficient documentation

## 2020-11-24 ENCOUNTER — Ambulatory Visit: Payer: Medicare HMO | Admitting: Obstetrics and Gynecology

## 2021-03-09 ENCOUNTER — Other Ambulatory Visit: Payer: Self-pay

## 2021-03-09 DIAGNOSIS — K551 Chronic vascular disorders of intestine: Secondary | ICD-10-CM

## 2021-03-10 ENCOUNTER — Other Ambulatory Visit: Payer: Self-pay | Admitting: Nurse Practitioner

## 2021-03-10 ENCOUNTER — Other Ambulatory Visit: Payer: Self-pay | Admitting: Obstetrics and Gynecology

## 2021-03-10 DIAGNOSIS — M8580 Other specified disorders of bone density and structure, unspecified site: Secondary | ICD-10-CM

## 2021-03-10 DIAGNOSIS — Z1231 Encounter for screening mammogram for malignant neoplasm of breast: Secondary | ICD-10-CM

## 2021-03-23 ENCOUNTER — Other Ambulatory Visit: Payer: Self-pay

## 2021-03-23 ENCOUNTER — Ambulatory Visit (HOSPITAL_COMMUNITY)
Admission: RE | Admit: 2021-03-23 | Discharge: 2021-03-23 | Disposition: A | Discharge status: Home / Self Care | Payer: Medicare HMO | Source: Ambulatory Visit | Attending: Vascular Surgery | Admitting: Vascular Surgery

## 2021-03-23 ENCOUNTER — Ambulatory Visit: Payer: Medicare HMO | Admitting: Physician Assistant

## 2021-03-23 VITALS — BP 179/88 | HR 71 | Temp 97.6°F | Resp 20 | Ht 66.0 in | Wt 158.6 lb

## 2021-03-23 DIAGNOSIS — K551 Chronic vascular disorders of intestine: Secondary | ICD-10-CM | POA: Diagnosis not present

## 2021-03-23 NOTE — Progress Notes (Signed)
Office Note     CC:  follow up Requesting Provider:  Renee Rival, NP  HPI: Dorothy [name] is a 65 y.o. 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 Oneida Alar on September 25, 2012.  The patient was last seen 3 years ago.  She recovered well from her surgery.  She currently denies abdominal pain, decreased appetite, food fear or weight loss.  She denies lower extremity pain with exercise or history of stroke/TIA.  She states she has not been placed on statin or aspirin therapy since discussion with her primary care physician.  She continues to smoke cigarettes.  She states she is treated for hypertension and is a seems to be in good control.  States she sees her primary care provider Angelina Ok, NP regularly.  Past Medical History:  Diagnosis Date  . Anxiety   . Arthritis    hands and Knees  . Avascular necrosis of bones of both hips ([place]) 01/17/2012  . Benign essential HTN 01/17/2012  . Cancer ([place]) 1982   ovarian  . Cancer of cervix ([place])   . Constipation   . Depression    Takes Klonopin  . Depression with anxiety 01/17/2012  . GERD (gastroesophageal reflux disease)   . Hypercholesteremia   . Hypertension    takes meds  . Hypothyroidism    on Levonthyroxine  . Hypothyroidism (acquired) 01/17/2012   Since age 62  . Osteopenia 2018   forearm  . Ovarian cyst 01/17/2012   1995  BSO  . Personal history of colonic polyps 11/07/2007  . Pneumonia    x 2- last time > 5 years ago  . Pneumonia 01/17/2012   10 yrs ago and 5 years ago   . Rash and other nonspecific skin eruption 01/17/2012   Dorsum hands & extensor surfaces arms; intermittent    Past Surgical History:  Procedure Laterality Date  . ABDOMINAL HYSTERECTOMY  1982   ovaries retained--for CIS  . CELIAC ARTERY BYPASS  09/25/2012   Procedure: CELIAC ARTERY BYPASS;  Surgeon: Elam Dutch, MD;  Location: [facility] OR;  Service: Vascular;   Laterality: N/A;  Bypass using Hemashield Gold Vascular Graft 75mm x 42mm x 40cm  . COLONOSCOPY  2009   [place]-polyps, 5 yr FU  . JOINT REPLACEMENT    . MESENTERIC ARTERY BYPASS  09/25/2012   Procedure: MESENTERIC ARTERY BYPASS;  Surgeon: Elam Dutch, MD;  Location: [facility] OR;  Service: Vascular;  Laterality: N/A;  Bypass using Hemashield Gold Vascular Graft 51mm x 5mm x 40cm  . 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 TOTAL HIP ARTHROPLASTY  09/06/2011   Procedure: TOTAL HIP ARTHROPLASTY;  Surgeon: Ninetta Lights, MD;  Location: [place];  Service: Orthopedics;  Laterality: Left;  TOTAL HIP ARTHROPLASTY LEFT SIDE  . TOTAL HIP ARTHROPLASTY  10/11/2011   Procedure: TOTAL HIP ARTHROPLASTY;  Surgeon: Ninetta Lights, MD;  Location: [place];  Service: Orthopedics;  Laterality: Right;  120 MINUTES FOR THIS SURGERY  . WOUND EXPLORATION  10/02/2012   Procedure: WOUND EXPLORATION;  Surgeon: Elam Dutch, MD;  Location: [facility] OR;  Service: Vascular;  Laterality: N/A;  abdominal wound exploration    Social History   Socioeconomic History  . Marital status: Widowed    Spouse name: Not on file  . Number of children: 1  . Years of education: Not on file  .  Highest education level: Not on file  Occupational History  . Occupation: disabled  Tobacco Use  . Smoking status: Current Every Day Smoker    Packs/day: 1.00    Years: 40.00    Pack years: 40.00    Types: Cigarettes  . Smokeless tobacco: Never Used  . Tobacco comment: pt states that she wants to quit but needs help was advised to call 1-800-QUITNOW and to speak with PCP about avaliable aids  Vaping Use  . Vaping Use: Never used  Substance and Sexual Activity  . Alcohol use: No    Alcohol/week: 0.0 standard drinks  . Drug use: No  . Sexual activity: Yes    Partners: Male    Birth control/protection: Surgical    Comment: TVH/ovaries retained  Other Topics Concern  . Not on file  Social History  Narrative  . Not on file   Social Determinants of Health   Financial Resource Strain: Not on file  Food Insecurity: Not on file  Transportation Needs: Not on file  Physical Activity: Not on file  Stress: Not on file  Social Connections: Not on file  Intimate Partner Violence: Not on file   Family History  Problem Relation Age of Onset  . Breast cancer Sister 55       dec age 73  . Cancer Sister   . Cancer Mother        lung  . Heart failure Father   . Hypertension Son   . Ovarian cancer Paternal 82        dec age 58  . Heart attack Brother 33  . Anesthesia problems Neg Hx   . Hypotension Neg Hx   . Malignant hyperthermia Neg Hx   . Pseudochol deficiency Neg Hx   . Colon cancer Neg Hx   . Esophageal cancer Neg Hx   . Rectal cancer Neg Hx   . Stomach cancer Neg Hx     Current Outpatient Medications  Medication Sig Dispense Refill  . ADVAIR DISKUS 250-50 MCG/DOSE AEPB Inhale 1 puff into the lungs daily as needed (for shortness of breath).     Marland Kitchen albuterol (VENTOLIN HFA) 108 (90 Base) MCG/ACT inhaler Inhale into the lungs.    . ARIPiprazole (ABILIFY) 10 MG tablet Take by mouth. Takes 1/2 daily    . CALCIUM PO Take 1 tablet by mouth daily.    . clonazePAM (KLONOPIN) 0.5 MG tablet Take 1 mg by mouth 2 (two) times daily.    . dorzolamide-timolol (COSOPT) 22.3-6.8 MG/ML ophthalmic solution SMARTSIG:In Eye(s)    . Estradiol (VAGIFEM) 10 MCG TABS vaginal tablet Place 1 tablet (10 mcg total) vaginally 2 (two) times a week. Use every night before bed for two weeks when you first begin this medicine, then after the first two weeks, begin using it twice a week. 18 tablet 12  . hydrochlorothiazide (HYDRODIURIL) 25 MG tablet Take 12.5 mg by mouth daily.    Marland Kitchen ibuprofen (ADVIL) 800 MG tablet Take by mouth.    Marland Kitchen ketorolac (ACULAR) 0.5 % ophthalmic solution     . levothyroxine (SYNTHROID) 125 MCG tablet Take 125 mcg by mouth daily.    Marland Kitchen lisinopril (ZESTRIL) 20 MG tablet Take 20 mg  by mouth daily.    Marland Kitchen moxifloxacin (VIGAMOX) 0.5 % ophthalmic solution     . Multiple Vitamins-Minerals (MULTIVITAMINS THER. W/MINERALS) TABS tablet Take 1 tablet by mouth daily.    . prednisoLONE acetate (PRED FORTE) 1 % ophthalmic suspension     . venlafaxine (  EFFEXOR-XR) 150 MG 24 hr capsule Take 300 mg by mouth daily with breakfast.    . zolpidem (AMBIEN) 10 MG tablet      No current facility-administered medications for this visit.    Allergies  Allergen Reactions  . Sulfa Antibiotics Rash     REVIEW OF SYSTEMS:   [X]  denotes positive finding, [ ]  denotes negative finding Cardiac  Comments:  Chest pain or chest pressure:    Shortness of breath upon exertion:    Short of breath when lying flat:    Irregular heart rhythm:        Vascular    Pain in calf, thigh, or hip brought on by ambulation:    Pain in feet at night that wakes you up from your sleep:     Blood clot in your veins:    Leg swelling:         Pulmonary    Oxygen at home:    Productive cough:     Wheezing:         Neurologic    Sudden weakness in arms or legs:     Sudden numbness in arms or legs:     Sudden onset of difficulty speaking or slurred speech:    Temporary loss of vision in one eye:     Problems with dizziness:         Gastrointestinal    Blood in stool:     Vomited blood:         Genitourinary    Burning when urinating:     Blood in urine:        Psychiatric    Major depression:         Hematologic    Bleeding problems:    Problems with blood clotting too easily:        Skin    Rashes or ulcers:        Constitutional    Fever or chills:      PHYSICAL EXAMINATION:  Vitals:   03/23/21 0948  BP: (!) 179/88  Pulse: 71  Resp: 20  Temp: 97.6 F (36.4 C)  TempSrc: Temporal  SpO2: 98%  Weight: 158 lb 9.6 oz (71.9 kg)  Height: 5\' 6"  (1.676 m)    General:  WDWN in NAD; vital signs documented above Gait: Not observed HENT: WNL, normocephalic Pulmonary: normal  non-labored breathing , without Rales, rhonchi,  wheezing Cardiac: regular HR, without  Murmurs without carotid bruit Abdomen: soft, NT, no masses Skin: without rashes Vascular Exam/Pulses: 2+ radial and Steer tibial pulses bilaterally. Extremities: without ischemic changes, without Gangrene , without cellulitis; without open wounds;  Musculoskeletal: no muscle wasting or atrophy  Neurologic: A&O X 3;  No focal weakness or paresthesias are detected Psychiatric:  The pt has Normal affect.   Non-Invasive Vascular Imaging:   03/23/2021  Duplex Findings:  +----------------------+--------+--------+------+--------+  Mesenteric      PSV cm/sEDV cm/sPlaqueComments  +----------------------+--------+--------+------+--------+  Aorta Prox        51               +----------------------+--------+--------+------+--------+  Celiac Artery Origin   52               +----------------------+--------+--------+------+--------+  Celiac Artery Proximal  56               +----------------------+--------+--------+------+--------+  SMA Origin       242               +----------------------+--------+--------+------+--------+  SMA  Proximal      228               +----------------------+--------+--------+------+--------+  SMA Mid         131               +----------------------+--------+--------+------+--------+  SMA Distal       197               +----------------------+--------+--------+------+--------+     Mesenteric Technologist observations: Velocities of the celiac and superior mesenteric arteries above are of the bypass grafts.  Summary:  Mesenteric: Widely patent celiac and superior mesenteric artery bypass  graft.   ASSESSMENT/PLAN:: 65 y.o. 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  She is doing well without symptoms referable to mesenteric ischemia.  Duplex ultrasound today is stable and unchanged from 3 years ago.  Is patent.  Recommend consideration to statin and aspirin therapy, tight blood pressure control and smoking cessation.  Follow-up in 1 year with mesenteric duplex.  I encouraged her to call us sooner for reevaluation should she develop symptoms.    Barbie Banner, PA-C [facility] [phone]  Clinic MD:   Dr. Scot Dock

## 2021-05-10 ENCOUNTER — Ambulatory Visit: Payer: Medicare HMO | Admitting: Nurse Practitioner

## 2021-05-10 ENCOUNTER — Encounter: Payer: Self-pay | Admitting: Nurse Practitioner

## 2021-05-10 ENCOUNTER — Other Ambulatory Visit: Payer: Self-pay

## 2021-05-10 VITALS — BP 122/80

## 2021-05-10 DIAGNOSIS — L089 Local infection of the skin and subcutaneous tissue, unspecified: Secondary | ICD-10-CM

## 2021-05-10 MED ORDER — DOXYCYCLINE MONOHYDRATE 100 MG PO CAPS: 100.0000 mg | ORAL_CAPSULE | Freq: Two times a day (BID) | ORAL | 0 refills | Status: AC

## 2021-05-10 NOTE — Progress Notes (Signed)
   Acute Office Visit  Subjective:    Patient ID: Dorothy [name], female    DOB: 1955/11/06, 65 y.o.   MRN: [id number]   HPI 65 y.o. presents today for vaginal bump. Bump has been present for weeks per patient and then started to drain on its own yesterday. Drainage was pink-tinged/bloody. No fevers.    Review of Systems  Constitutional: Negative.  Negative for fever.  Skin:  Positive for wound (on pubic area, red, no longer draining).      Objective:    Physical Exam Constitutional:      Appearance: Normal appearance.  Skin:    Findings: Abscess and wound present.         BP 122/80   LMP 10/23/1980 Comment: Hyst age 85(ovaries retained) Wt Readings from Last 3 Encounters:  03/23/21 158 lb 9.6 oz (71.9 kg)  08/25/20 164 lb (74.4 kg)  08/16/20 168 lb (76.2 kg)        Assessment & Plan:   Problem List Items Addressed This Visit   None Visit Diagnoses     Soft tissue infection    -  Primary   Relevant Medications   doxycycline (MONODOX) 100 MG capsule      Plan: Appears that abscess has drained naturally but some evidence of soft skin infection present. Doxycycline 100 mg twice daily x 14 days. Moist, warm compresses and Tylenol/Ibuprofen as needed for pain. If symptoms worsen or do not improve she will return to office. She is agreeable to plan.      Tamela Gammon DNP, 11:35 AM 05/10/2021

## 2021-07-19 ENCOUNTER — Ambulatory Visit: Payer: Medicare HMO | Admitting: Obstetrics and Gynecology

## 2021-07-25 DIAGNOSIS — M810 Age-related osteoporosis without current pathological fracture: Secondary | ICD-10-CM

## 2021-07-25 HISTORY — DX: Age-related osteoporosis without current pathological fracture: M81.0

## 2021-08-10 NOTE — Progress Notes (Addendum)
65 y.o. G61P1011 Widowed Caucasian female here for annual exam.    She is followed for abnormal paps and osteopenia.   Wants to continue Vagifem.  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   Desires STD screening.   PCP:   Angelina Ok, NP  Patient's last menstrual period was 10/23/1980.           Sexually active: Yes.    The current method of family planning is status post hysterectomy.    Exercising: No.   Exercise  Smoker:  yes  Health Maintenance: Pap:  07-14-20 LGSIL HPV HR neg/colpo 08/16/20:  vaginal cuff biopsies LGSIL. pap 04-16-19 neg HPV HR neg, 03-20-18 LGSIL HPV HR neg History of abnormal Pap:  yes MMG:  07-06-20 category b density birads 1:neg.  Scheduled for next week.  Colonoscopy:  03-07-13 polyp removed f/u 18yrs BMD:   07-04-19  Result  osteopenia of spine and radius.   Scheduled  for next week.  TDaP:  pcp Gardasil:   n/a HIV: neg 2021 Hep C: neg 2021 Screening Labs:  PCP Covid boosters x 2.  Flu vaccine:  completed.   reports that she has been smoking cigarettes. She has a 40.00 pack-year smoking history. She has never used smokeless tobacco. She reports that she does not drink alcohol and does not use drugs.  Past Medical History:  Diagnosis Date   Anxiety    Arthritis    hands and Knees   Avascular necrosis of bones of both hips ([place]) 01/17/2012   Benign essential HTN 01/17/2012   Cancer ([place]) 1982   ovarian   Cancer of cervix ([place])    Constipation    Depression    Takes Klonopin   Depression with anxiety 01/17/2012   GERD (gastroesophageal reflux disease)    Hypercholesteremia    Hypertension    takes meds   Hypothyroidism    on Levonthyroxine   Hypothyroidism (acquired) 01/17/2012   Since age 60   Osteopenia 2018   forearm   Ovarian cyst 01/17/2012   1995  BSO   Personal history of colonic polyps 11/07/2007   Pneumonia    x 2- last time > 5 years ago   Pneumonia 01/17/2012   10 yrs ago and 5 years ago    Rash and  other nonspecific skin eruption 01/17/2012   Dorsum hands & extensor surfaces arms; intermittent    Past Surgical History:  Procedure Laterality Date   ABDOMINAL HYSTERECTOMY  1982   ovaries retained--for CIS   CELIAC ARTERY BYPASS  09/25/2012   Procedure: CELIAC ARTERY BYPASS;  Surgeon: Elam Dutch, MD;  Location: [place];  Service: Vascular;  Laterality: N/A;  Bypass using Hemashield Gold Vascular Graft 37mm x 70mm x 40cm   COLONOSCOPY  2009   [place]-polyps, 5 yr FU   JOINT REPLACEMENT     MESENTERIC ARTERY BYPASS  09/25/2012   Procedure: MESENTERIC ARTERY BYPASS;  Surgeon: Elam Dutch, MD;  Location: [place];  Service: Vascular;  Laterality: N/A;  Bypass using Hemashield Gold Vascular Graft 38mm x 60mm x 40cm   OVARIAN CYST REMOVAL  1997   -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 OR;  Service: Vascular;  Laterality: N/A;  abdominal wound exploration    Current Outpatient Medications  Medication Sig Dispense Refill   ADVAIR DISKUS 250-50 MCG/DOSE AEPB Inhale 1 puff into the lungs daily as needed (for shortness of breath).      albuterol (VENTOLIN HFA) 108 (90 Base) MCG/ACT inhaler Inhale into the lungs.     ARIPiprazole (ABILIFY) 10 MG tablet Take by mouth. Takes 1/2 daily     CALCIUM PO Take 1 tablet by mouth daily.     clonazePAM (KLONOPIN) 0.5 MG tablet Take 1 mg by mouth 2 (two) times daily.     ELDERBERRY PO Take by mouth.     Estradiol (VAGIFEM) 10 MCG TABS vaginal  tablet Place 1 tablet (10 mcg total) vaginally 2 (two) times a week. Use every night before bed for two weeks when you first begin this medicine, then after the first two weeks, begin using it twice a week. 18 tablet 12   hydrochlorothiazide (HYDRODIURIL) 25 MG tablet Take 25 mg by mouth daily.     ibuprofen (ADVIL) 800 MG tablet Take by mouth.     levothyroxine (SYNTHROID) 125 MCG tablet Take 125 mcg by mouth daily.     lisinopril (ZESTRIL) 20 MG tablet Take 20 mg by mouth daily.     Multiple Vitamins-Minerals (MULTIVITAMINS THER. W/MINERALS) TABS tablet Take 1 tablet by mouth daily.     venlafaxine (EFFEXOR-XR) 150 MG 24 hr capsule Take 300 mg by mouth daily with breakfast.     VITAMIN D PO Take by mouth.     zolpidem (AMBIEN) 10 MG tablet      No current facility-administered medications for this visit.    Family History  Problem Relation Age of Onset   Breast cancer Sister 48       dec age 12   Cancer Sister    Cancer Mother        lung   Heart failure Father    Hypertension Son    Ovarian cancer Paternal Grandmother        dec age 71   Heart attack Brother 60   Anesthesia problems Neg Hx    Hypotension Neg Hx    Malignant hyperthermia Neg Hx    Pseudochol deficiency Neg Hx    Colon cancer Neg Hx    Esophageal cancer Neg Hx    Rectal cancer Neg Hx    Stomach cancer Neg Hx     Review of Systems  Constitutional: Negative.   HENT: Negative.    Eyes: Negative.   Respiratory: Negative.    Cardiovascular: Negative.   Gastrointestinal: Negative.   Endocrine: Negative.   Genitourinary: Negative.   Musculoskeletal: Negative.   Skin: Negative.   Allergic/Immunologic: Negative.   Neurological: Negative.   Hematological: Negative.   Psychiatric/Behavioral: Negative.     Exam:   BP 110/80   Pulse 68   Resp 16   Ht 5' 5.75" (1.67 m)   Wt 157 lb (71.2 kg)   LMP 10/23/1980 Comment: Hyst age 69(ovaries retained)  BMI 25.53 kg/m     General appearance: alert, cooperative  and appears stated age Head: normocephalic, without obvious abnormality, atraumatic Neck: no adenopathy, supple, symmetrical, trachea midline and thyroid normal to inspection and palpation Lungs: clear to  auscultation bilaterally Breasts: normal appearance, no masses or tenderness, No nipple retraction or dimpling, No nipple discharge or bleeding, No axillary adenopathy Heart: regular rate and rhythm Abdomen: soft, non-tender; no masses, no organomegaly Extremities: extremities normal, atraumatic, no cyanosis or edema Skin: skin color, texture, turgor normal. No rashes or lesions Lymph nodes: cervical, supraclavicular, and axillary nodes normal. Neurologic: grossly normal  Pelvic: External genitalia:  no lesions              No abnormal inguinal nodes palpated.              Urethra:  normal appearing urethra with no masses, tenderness or lesions              Bartholins and Skenes: normal                 Vagina: normal appearing vagina with normal color and discharge, no lesions              Cervix: absent.              Pap taken: yes Bimanual Exam:  Uterus:  absent              Adnexa: no mass, fullness, tenderness              Rectal exam: yes.  Confirms.              Anus:  normal sphincter tone, no lesions  Chaperone was present for exam:  Joy, CMA.   Assessment:   Well woman visit with gynecologic exam. GYN Exam for high risk Medicare patient.  Former ERT patient.   Status post total vaginal hysterectomy for CIS.   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  Status post Effudex.  LGSIL pap last year with normal colpo biopsy. FH of ovarian cancer in paternal grandmother and breast cancer in sister. Sister had genetic testing but patient is unaware of the results.  Hx mesenteric ischemia and bypass surgery. Hx retinal vein occlusion.  Smoker.  Osteopenia.  Hypothyroidism.   STD screening.   Plan: Mammogram screening discussed. Self breast  awareness reviewed. Pap and HR HPV as above. Guidelines for Calcium, Vitamin D, regular exercise program including cardiovascular and weight bearing exercise. I recommend she stop vaginal estrogen and start OTC vaginal vit E suppositories and cooking oils.  STD screening.  BMD scheduled  Follow up annually and prn.   After visit summary provided.   33 min  total time was spent for this patient encounter, including preparation, face-to-face counseling with the patient, coordination of care, and documentation of the encounter.

## 2021-08-12 ENCOUNTER — Encounter: Payer: Self-pay | Admitting: Obstetrics and Gynecology

## 2021-08-12 ENCOUNTER — Ambulatory Visit (INDEPENDENT_AMBULATORY_CARE_PROVIDER_SITE_OTHER): Payer: Medicare HMO | Admitting: Obstetrics and Gynecology

## 2021-08-12 ENCOUNTER — Other Ambulatory Visit (HOSPITAL_COMMUNITY)
Admission: RE | Admit: 2021-08-12 | Discharge: 2021-08-12 | Disposition: A | Discharge status: Home / Self Care | Payer: Medicare HMO | Source: Ambulatory Visit | Attending: Obstetrics and Gynecology | Admitting: Obstetrics and Gynecology

## 2021-08-12 ENCOUNTER — Other Ambulatory Visit: Payer: Self-pay

## 2021-08-12 VITALS — BP 110/80 | HR 68 | Resp 16 | Ht 65.75 in | Wt 157.0 lb

## 2021-08-12 DIAGNOSIS — M8589 Other specified disorders of bone density and structure, multiple sites: Secondary | ICD-10-CM

## 2021-08-12 DIAGNOSIS — Z113 Encounter for screening for infections with a predominantly sexual mode of transmission: Secondary | ICD-10-CM | POA: Insufficient documentation

## 2021-08-12 DIAGNOSIS — Z01419 Encounter for gynecological examination (general) (routine) without abnormal findings: Secondary | ICD-10-CM | POA: Diagnosis not present

## 2021-08-12 DIAGNOSIS — Z87411 Personal history of vaginal dysplasia: Secondary | ICD-10-CM | POA: Insufficient documentation

## 2021-08-12 DIAGNOSIS — Z8543 Personal history of malignant neoplasm of ovary: Secondary | ICD-10-CM | POA: Diagnosis not present

## 2021-08-12 DIAGNOSIS — Z1151 Encounter for screening for human papillomavirus (HPV): Secondary | ICD-10-CM | POA: Diagnosis not present

## 2021-08-12 DIAGNOSIS — Z9289 Personal history of other medical treatment: Secondary | ICD-10-CM

## 2021-08-12 DIAGNOSIS — Z8742 Personal history of other diseases of the female genital tract: Secondary | ICD-10-CM

## 2021-08-12 DIAGNOSIS — Z9189 Other specified personal risk factors, not elsewhere classified: Secondary | ICD-10-CM | POA: Diagnosis not present

## 2021-08-12 MED ORDER — ESTRADIOL 10 MCG VA TABS: 1.0000 | ORAL_TABLET | VAGINAL | 12 refills | Status: DC

## 2021-08-12 NOTE — Patient Instructions (Addendum)
Consider vaginal vitamin E suppositories for the vagina to help with vaginal dryness symptoms.  You can buy this on [employer].    EXERCISE AND DIET:  We recommended that you start or continue a regular exercise program for good health. Regular exercise means any activity that makes your heart beat faster and makes you sweat.  We recommend exercising at least 30 minutes per day at least 3 days a week, preferably 4 or 5.  We also recommend a diet low in fat and sugar.  Inactivity, poor dietary choices and obesity can cause diabetes, heart attack, stroke, and kidney damage, among others.    ALCOHOL AND SMOKING:  Women should limit their alcohol intake to no more than 7 drinks/beers/glasses of wine (combined, not each!) per week. Moderation of alcohol intake to this level decreases your risk of breast cancer and liver damage. And of course, no recreational drugs are part of a healthy lifestyle.  And absolutely no smoking or even second hand smoke. Most people know smoking can cause heart and lung diseases, but did you know it also contributes to weakening of your bones? Aging of your skin?  Yellowing of your teeth and nails?  CALCIUM AND VITAMIN D:  Adequate intake of calcium and Vitamin D are recommended.  The recommendations for exact amounts of these supplements seem to change often, but generally speaking 600 mg of calcium (either carbonate or citrate) and 800 units of Vitamin D per day seems prudent. Certain women may benefit from higher intake of Vitamin D.  If you are among these women, your doctor will have told you during your visit.    PAP SMEARS:  Pap smears, to check for cervical cancer or precancers,  have traditionally been done yearly, although recent scientific advances have shown that most women can have pap smears less often.  However, every woman still should have a physical exam from her gynecologist every year. It will include a breast check, inspection of the vulva and vagina to check for  abnormal growths or skin changes, a visual exam of the cervix, and then an exam to evaluate the size and shape of the uterus and ovaries.  And after 64 years of age, a rectal exam is indicated to check for rectal cancers. We will also provide age appropriate advice regarding health maintenance, like when you should have certain vaccines, screening for sexually transmitted diseases, bone density testing, colonoscopy, mammograms, etc.   MAMMOGRAMS:  All women over 30 years old should have a yearly mammogram. Many facilities now offer a "3D" mammogram, which may cost around $50 extra out of pocket. If possible,  we recommend you accept the option to have the 3D mammogram performed.  It both reduces the number of women who will be called back for extra views which then turn out to be normal, and it is better than the routine mammogram at detecting truly abnormal areas.    COLONOSCOPY:  Colonoscopy to screen for colon cancer is recommended for all women at age 64.  We know, you hate the idea of the prep.  We agree, BUT, having colon cancer and not knowing it is worse!!  Colon cancer so often starts as a polyp that can be seen and removed at colonscopy, which can quite literally save your life!  And if your first colonoscopy is normal and you have no family history of colon cancer, most women don't have to have it again for 10 years.  Once every ten years, you can do  something that may end up saving your life, right?  We will be happy to help you get it scheduled when you are ready.  Be sure to check your insurance coverage so you understand how much it will cost.  It may be covered as a preventative service at no cost, but you should check your particular policy.

## 2021-08-15 LAB — HEPATITIS B SURFACE ANTIGEN: Hepatitis B Surface Ag: NONREACTIVE

## 2021-08-15 LAB — HEPATITIS C ANTIBODY
Hepatitis C Ab: NONREACTIVE
SIGNAL TO CUT-OFF: 0.09 (ref <1.00)

## 2021-08-15 LAB — HIV ANTIBODY (ROUTINE TESTING W REFLEX): HIV 1&2 Ab, 4th Generation: NONREACTIVE

## 2021-08-15 LAB — RPR: RPR Ser Ql: NONREACTIVE

## 2021-08-16 LAB — CYTOLOGY - PAP
Adequacy: ABSENT
Chlamydia: NEGATIVE
Comment: NEGATIVE
Comment: NEGATIVE
Comment: NEGATIVE
Comment: NORMAL
Diagnosis: NEGATIVE
High risk HPV: NEGATIVE
Neisseria Gonorrhea: NEGATIVE
Trichomonas: NEGATIVE

## 2021-08-22 ENCOUNTER — Other Ambulatory Visit: Payer: Self-pay

## 2021-08-22 ENCOUNTER — Ambulatory Visit
Admission: RE | Admit: 2021-08-22 | Discharge: 2021-08-22 | Disposition: A | Discharge status: Home / Self Care | Payer: Medicare HMO | Source: Ambulatory Visit | Attending: Nurse Practitioner | Admitting: Nurse Practitioner

## 2021-08-22 DIAGNOSIS — M8580 Other specified disorders of bone density and structure, unspecified site: Secondary | ICD-10-CM

## 2021-08-22 DIAGNOSIS — Z1231 Encounter for screening mammogram for malignant neoplasm of breast: Secondary | ICD-10-CM

## 2021-08-24 ENCOUNTER — Encounter: Payer: Self-pay | Admitting: Obstetrics and Gynecology

## 2021-09-12 ENCOUNTER — Encounter: Payer: Self-pay | Admitting: Obstetrics and Gynecology

## 2021-09-12 ENCOUNTER — Ambulatory Visit: Payer: Medicare HMO | Admitting: Obstetrics and Gynecology

## 2021-09-12 ENCOUNTER — Other Ambulatory Visit: Payer: Self-pay

## 2021-09-12 VITALS — BP 130/80

## 2021-09-12 DIAGNOSIS — M810 Age-related osteoporosis without current pathological fracture: Secondary | ICD-10-CM | POA: Diagnosis not present

## 2021-09-12 NOTE — Patient Instructions (Signed)
Osteoporosis Osteoporosis is when the bones get thin and weak. This can cause your bones to break (fracture) more easily. What are the causes? The exact cause of this condition is not known. What increases the risk? Having family members with this condition. Not eating enough healthy foods. Taking certain medicines. Being female. Being age 64 or older. Smoking or using other products that contain nicotine or tobacco, such as e-cigarettes or chewing tobacco. Not exercising. Being of European or Asian ancestry. Having a small body frame. What are the signs or symptoms? A broken bone might be the first sign, especially if the break results from a fall or injury that usually would not cause a bone to break. Other signs and symptoms include: Pain in the neck or low back. Being hunched over (stooped posture). Getting shorter. How is this treated? Eating more foods with more calcium and vitamin D in them. Doing exercises. Stopping tobacco use. Limiting how much alcohol you drink. Taking medicines to slow bone loss or help make the bones stronger. Taking supplements of calcium and vitamin D every day. Taking medicines to replace chemicals in the body (hormone replacement medicines). Monitoring your levels of calcium and vitamin D. The goal of treatment is to strengthen your bones and lower your risk for a bone break. Follow these instructions at home: Eating and drinking Eat plenty of calcium and vitamin D. These nutrients are good for your bones. Good sources of calcium and vitamin D include: Some fish, such as salmon and tuna. Foods that have calcium and vitamin D added to them (fortified foods), such as some breakfast cereals. Egg yolks. Cheese. Liver.  Activity Do exercises as told by your doctor. Ask your doctor what exercises are safe for you. You should do: Exercises that make your muscles work to hold your body weight up (weight-bearing exercises). These include tai chi,  yoga, and walking. Exercises to make your muscles stronger. One example is lifting weights. Lifestyle Do not drink alcohol if: Your doctor tells you not to drink. You are pregnant, may be pregnant, or are planning to become pregnant. If you drink alcohol: Limit how much you use to: 0-1 drink a day for women. 0-2 drinks a day for men. Know how much alcohol is in your drink. In the [place] one drink equals one 12 oz bottle of beer (355 mL), one 5 oz glass of wine (148 mL), or one 1 oz glass of hard liquor (44 mL). Do not smoke or use any products that contain nicotine or tobacco. If you need help quitting, ask your doctor. Preventing falls Use tools to help you move around (mobility aids) as needed. These include canes, walkers, scooters, and crutches. Keep rooms well-lit. Put away things on the floor that could make you trip. These include cords and rugs. Install safety rails on stairs. Install grab bars in bathrooms. Use rubber mats in slippery areas, like bathrooms. Wear shoes that: Fit you well. Support your feet. Have closed toes. Have rubber soles or low heels. Tell your doctor about all of the medicines you are taking. Some medicines can make you more likely to fall. General instructions Take over-the-counter and prescription medicines only as told by your doctor. Keep all follow-up visits. Contact a doctor if: You have not been tested (screened) for osteoporosis and you are: A woman who is age 65 or older. A man who is age 70 or older. Get help right away if: You fall. You get hurt. Summary Osteoporosis happens when your bones   get thin and weak. Weak bones can break (fracture) more easily. Eat plenty of calcium and vitamin D. These are good for your bones. Tell your doctor about all of the medicines that you take. This information is not intended to replace advice given to you by your health care provider. Make sure you discuss any questions you have with your health care  provider. Document Revised: 03/25/2020 Document Reviewed: 03/25/2020 Elsevier Patient Education  [place].

## 2021-09-12 NOTE — Progress Notes (Signed)
GYNECOLOGY  VISIT   HPI: 65 y.o.   Widowed  Caucasian  female   [id number] with Patient's last menstrual period was 10/23/1980.   here to discuss DEXA.  Accompanied by family today.   Early menopause.   Smoking 1 pack per day.  Chantix caused nightmares.  No steroid use.  No chronic diarrhea.  Occasional glass of wine.  She did [employer] screening.  She was told she has low blood flow in her left leg.   Just had a lot of dental surgery and she had multiple extractions and has a upper and lower partials, starting 6 weeks ago.   This last surgery was a while ago.    GYNECOLOGIC HISTORY: Patient's last menstrual period was 10/23/1980. 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 11/04/2020   Nuclear sclerotic cataract of both eyes 11/04/2020   Peripheral focal chorioretinal inflammation of both eyes 11/04/2020   Pulmonary nodule 08/18/2016   COPD with exacerbation ([place]) 08/18/2016   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   Acute respiratory failure with hypoxia ([place]) 09/25/2012   Hyponatremia 07/10/2012   Abdominal pain 07/10/2012   Hematochezia 07/10/2012   Avascular necrosis of bones of both hips ([place]) 01/17/2012   Hypothyroidism (acquired) 01/17/2012   Benign essential HTN 01/17/2012   Depression with anxiety 01/17/2012   Cancer of cervix ([place]) 01/17/2012   Ovarian cyst 01/17/2012   Community acquired pneumonia 01/17/2012   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   Rash and other nonspecific skin eruption 01/17/2012   Personal history of colonic polyps 11/07/2007    Past Medical History:  Diagnosis Date   Anxiety    Arthritis    hands and Knees   Avascular necrosis of bones of both hips ([place]) 01/17/2012   Benign essential HTN 01/17/2012   Cancer ([place]) 1982   ovarian   Cancer of cervix ([place])    Constipation    Depression    Takes Klonopin   Depression with anxiety 01/17/2012   GERD (gastroesophageal reflux disease)    Hypercholesteremia    Hypertension    takes meds   Hypothyroidism    on Levonthyroxine   Hypothyroidism (acquired) 01/17/2012   Since age 71   Osteopenia 2018   forearm   Osteoporosis 07/25/2021   forearm   Ovarian cyst 01/17/2012   1995  BSO   Personal history of colonic polyps 11/07/2007   Pneumonia    x 2- last time > 5 years ago   Pneumonia 01/17/2012   10 yrs ago and 5 years ago    Rash and other nonspecific skin eruption 01/17/2012   Dorsum hands & extensor surfaces arms; intermittent    Past Surgical History:  Procedure Laterality Date   ABDOMINAL HYSTERECTOMY  1982   ovaries retained--for CIS   CELIAC ARTERY BYPASS  09/25/2012   Procedure: CELIAC ARTERY BYPASS;  Surgeon: Elam Dutch, MD;  Location: [place];  Service: Vascular;  Laterality: N/A;  Bypass using Hemashield Gold Vascular Graft 74mm x 38mm x 40cm   COLONOSCOPY  2009   [place]-polyps, 5 yr FU   JOINT REPLACEMENT     MESENTERIC ARTERY BYPASS  09/25/2012   Procedure: MESENTERIC ARTERY BYPASS;  Surgeon: Elam Dutch, MD;  Location: [facility];  Service: Vascular;  Laterality: N/A;  Bypass using Hemashield Gold Vascular Graft 13mm x 38mm x 40cm   OVARIAN CYST REMOVAL  1997   -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   TOTAL HIP ARTHROPLASTY  09/06/2011   Procedure: TOTAL HIP ARTHROPLASTY;  Surgeon: Ninetta Lights, MD;  Location: [facility];  Service: Orthopedics;  Laterality: Left;  TOTAL HIP ARTHROPLASTY LEFT SIDE   TOTAL  HIP ARTHROPLASTY  10/11/2011   Procedure: TOTAL HIP ARTHROPLASTY;  Surgeon: Ninetta Lights, MD;  Location: [facility] [place];  Service: Orthopedics;  Laterality: Right;  120 MINUTES FOR THIS SURGERY   WOUND EXPLORATION  10/02/2012   Procedure: WOUND EXPLORATION;  Surgeon: Elam Dutch, MD;  Location[facility];  Service: Vascular;  Laterality: N/A;  abdominal wound exploration    Current Outpatient Medications  Medication Sig Dispense Refill   ADVAIR DISKUS 250-50 MCG/DOSE AEPB Inhale 1 puff into the lungs daily as needed (for shortness of breath).      albuterol (VENTOLIN HFA) 108 (90 Base) MCG/ACT inhaler Inhale into the lungs.     ARIPiprazole (ABILIFY) 10 MG tablet Take by mouth. Takes 1/2 daily     CALCIUM PO Take 1 tablet by mouth daily.     clonazePAM (KLONOPIN) 0.5 MG tablet Take 1 mg by mouth 2 (two) times daily.     ELDERBERRY PO Take by mouth.     hydrochlorothiazide (HYDRODIURIL) 25 MG tablet Take 25 mg by mouth daily.     ibuprofen (ADVIL) 800 MG tablet Take by mouth.     levothyroxine (SYNTHROID) 125 MCG tablet Take 125 mcg by mouth daily.     lisinopril (ZESTRIL) 20 MG tablet Take 20 mg by mouth daily.     Multiple Vitamins-Minerals (MULTIVITAMINS THER. W/MINERALS) TABS tablet Take 1 tablet by mouth daily.     venlafaxine (EFFEXOR-XR) 150 MG 24 hr capsule Take 300 mg by mouth daily with breakfast.     VITAMIN D PO Take by mouth.     zolpidem (AMBIEN) 10 MG tablet      No current facility-administered medications for this visit.     ALLERGIES: Sulfa antibiotics  Family History  Problem Relation Age of Onset   Breast cancer Sister 81       dec age 56   Cancer Sister    Cancer Mother        lung   Heart failure Father    Hypertension Son    Ovarian cancer Paternal Grandmother        dec age 8   Heart attack Brother 35   Anesthesia problems Neg Hx    Hypotension Neg Hx    Malignant hyperthermia Neg Hx    Pseudochol deficiency Neg Hx    Colon cancer Neg Hx    Esophageal  cancer Neg Hx    Rectal cancer Neg Hx    Stomach cancer Neg Hx     Social History  Socioeconomic History   Marital status: Widowed    Spouse name: Not on file   Number of children: 1   Years of education: Not on file   Highest education level: Not on file  Occupational History   Occupation: disabled  Tobacco Use   Smoking status: Every Day    Packs/day: 1.00    Years: 40.00    Pack years: 40.00    Types: Cigarettes   Smokeless tobacco: Never   Tobacco comments:    pt states that she wants to quit but needs help was advised to call 1-800-QUITNOW and to speak with PCP about avaliable aids  Vaping Use   Vaping Use: Never used  Substance and Sexual Activity   Alcohol use: No    Alcohol/week: 0.0 standard drinks   Drug use: No   Sexual activity: Yes    Partners: Male    Birth control/protection: Surgical    Comment: TVH/ovaries retained  Other Topics Concern   Not on file  Social History Narrative   Not on file   Social Determinants of Health   Financial Resource Strain: Not on file  Food Insecurity: Not on file  Transportation Needs: Not on file  Physical Activity: Not on file  Stress: Not on file  Social Connections: Not on file  Intimate Partner Violence: Not on file    Review of Systems  See HPI.   PHYSICAL EXAMINATION:    BP 130/80 (BP Location: Right Arm, Patient Position: Sitting, Cuff Size: Normal)   LMP 10/23/1980 Comment: Hyst age 3(ovaries retained)    General appearance: alert, cooperative and appears stated age    ASSESSMENT  Osteoporosis.   PLAN  BMD reviewed.  Increased fracture risk explained.  Risk factors reviewed.  She declines smoking cessation.  I did discuss nicotine gum and patches.  Will check PTH, calcium, vit D, and phosphorus. Medication reviewed:  Fosamax and Actonel as starting medications.  She will check with her dentist about when it could be safe for her to start this kind of medication.  Fall risk reduction  reviewed.  Calcium and vit D goals and food choices reviewed.  Weight bearing and vigorous exercise recommended.  Next BMD in 2 years or 2 years after starting therapy.  Fu prn.   She will discuss her circulation changes with her PCP and get a copy of the report to bring to that appointment.    An After Visit Summary was printed and given to the patient.  29 min  total time was spent for this patient encounter, including preparation, face-to-face counseling with the patient, coordination of care, and documentation of the encounter.

## 2021-09-13 LAB — PTH, INTACT AND CALCIUM
Calcium: 9.9 mg/dL (ref 8.6–10.4)
PTH: 16 pg/mL (ref 16–77)

## 2021-09-13 LAB — PHOSPHORUS: Phosphorus: 4.5 mg/dL (ref 2.5–4.5)

## 2021-09-13 LAB — VITAMIN D 25 HYDROXY (VIT D DEFICIENCY, FRACTURES): Vit D, 25-Hydroxy: 68 ng/mL (ref 30–100)

## 2021-09-20 ENCOUNTER — Other Ambulatory Visit (HOSPITAL_COMMUNITY): Payer: Self-pay | Admitting: Nurse Practitioner

## 2021-09-20 ENCOUNTER — Other Ambulatory Visit: Payer: Self-pay | Admitting: Nurse Practitioner

## 2021-09-20 DIAGNOSIS — Z720 Tobacco use: Secondary | ICD-10-CM

## 2021-09-20 DIAGNOSIS — R0989 Other specified symptoms and signs involving the circulatory and respiratory systems: Secondary | ICD-10-CM

## 2021-09-23 ENCOUNTER — Telehealth: Payer: Self-pay | Admitting: *Deleted

## 2021-09-23 NOTE — Telephone Encounter (Signed)
Call transferred from front office.  Spoke with Dr. Neila Gear, patients Periodontist. He spoke with this patient on 09/22/21, calling to confirm what is needed from him for osteoporosis medications.   Advised per review of last OV, medications reviewed were Fosamax and Actonel. Patient to discuss with dentist about when it could be safe for her to start this kind of medication.   Dr. Neila Gear states patient has a couple of teeth that should be extracted before beginning medication, he will plan to discuss with patient directly next week when she returns to his office. Provider thankful for consideration before starting medication.  Questions answered.   Routing to Dr. Antony Blackbird.   Encounter closed.

## 2021-11-02 ENCOUNTER — Ambulatory Visit (HOSPITAL_COMMUNITY): Payer: Medicare HMO

## 2021-11-02 ENCOUNTER — Ambulatory Visit (HOSPITAL_COMMUNITY): Admission: RE | Admit: 2021-11-02 | Payer: Medicare HMO | Source: Ambulatory Visit

## 2021-11-02 ENCOUNTER — Encounter (HOSPITAL_COMMUNITY): Payer: Self-pay

## 2021-11-08 ENCOUNTER — Emergency Department (HOSPITAL_COMMUNITY): Payer: Medicare HMO

## 2021-11-08 ENCOUNTER — Inpatient Hospital Stay (HOSPITAL_COMMUNITY)
Admission: EM | Admit: 2021-11-08 | Discharge: 2021-11-10 | DRG: 640 | Disposition: A | Discharge status: Home / Self Care | Payer: Medicare HMO | Attending: Internal Medicine | Admitting: Internal Medicine

## 2021-11-08 ENCOUNTER — Other Ambulatory Visit: Payer: Self-pay

## 2021-11-08 ENCOUNTER — Encounter (HOSPITAL_COMMUNITY): Payer: Self-pay | Admitting: *Deleted

## 2021-11-08 DIAGNOSIS — M810 Age-related osteoporosis without current pathological fracture: Secondary | ICD-10-CM | POA: Diagnosis present

## 2021-11-08 DIAGNOSIS — N179 Acute kidney failure, unspecified: Secondary | ICD-10-CM | POA: Diagnosis present

## 2021-11-08 DIAGNOSIS — F17210 Nicotine dependence, cigarettes, uncomplicated: Secondary | ICD-10-CM | POA: Diagnosis present

## 2021-11-08 DIAGNOSIS — E860 Dehydration: Secondary | ICD-10-CM | POA: Diagnosis present

## 2021-11-08 DIAGNOSIS — F418 Other specified anxiety disorders: Secondary | ICD-10-CM | POA: Diagnosis not present

## 2021-11-08 DIAGNOSIS — E039 Hypothyroidism, unspecified: Secondary | ICD-10-CM | POA: Diagnosis not present

## 2021-11-08 DIAGNOSIS — E7800 Pure hypercholesterolemia, unspecified: Secondary | ICD-10-CM | POA: Diagnosis present

## 2021-11-08 DIAGNOSIS — M19042 Primary osteoarthritis, left hand: Secondary | ICD-10-CM | POA: Diagnosis present

## 2021-11-08 DIAGNOSIS — J410 Simple chronic bronchitis: Secondary | ICD-10-CM | POA: Diagnosis present

## 2021-11-08 DIAGNOSIS — K219 Gastro-esophageal reflux disease without esophagitis: Secondary | ICD-10-CM | POA: Diagnosis present

## 2021-11-08 DIAGNOSIS — Z7982 Long term (current) use of aspirin: Secondary | ICD-10-CM

## 2021-11-08 DIAGNOSIS — M170 Bilateral primary osteoarthritis of knee: Secondary | ICD-10-CM | POA: Diagnosis present

## 2021-11-08 DIAGNOSIS — Z8701 Personal history of pneumonia (recurrent): Secondary | ICD-10-CM

## 2021-11-08 DIAGNOSIS — J479 Bronchiectasis, uncomplicated: Secondary | ICD-10-CM | POA: Diagnosis not present

## 2021-11-08 DIAGNOSIS — D72829 Elevated white blood cell count, unspecified: Secondary | ICD-10-CM | POA: Diagnosis not present

## 2021-11-08 DIAGNOSIS — M19041 Primary osteoarthritis, right hand: Secondary | ICD-10-CM | POA: Diagnosis present

## 2021-11-08 DIAGNOSIS — E871 Hypo-osmolality and hyponatremia: Secondary | ICD-10-CM | POA: Diagnosis not present

## 2021-11-08 DIAGNOSIS — J189 Pneumonia, unspecified organism: Secondary | ICD-10-CM | POA: Diagnosis not present

## 2021-11-08 DIAGNOSIS — Z90710 Acquired absence of both cervix and uterus: Secondary | ICD-10-CM

## 2021-11-08 DIAGNOSIS — Z79899 Other long term (current) drug therapy: Secondary | ICD-10-CM

## 2021-11-08 DIAGNOSIS — Z20822 Contact with and (suspected) exposure to covid-19: Secondary | ICD-10-CM | POA: Diagnosis present

## 2021-11-08 DIAGNOSIS — Z79890 Hormone replacement therapy (postmenopausal): Secondary | ICD-10-CM

## 2021-11-08 DIAGNOSIS — J470 Bronchiectasis with acute lower respiratory infection: Secondary | ICD-10-CM | POA: Diagnosis present

## 2021-11-08 DIAGNOSIS — E063 Autoimmune thyroiditis: Secondary | ICD-10-CM | POA: Diagnosis present

## 2021-11-08 DIAGNOSIS — I10 Essential (primary) hypertension: Secondary | ICD-10-CM | POA: Diagnosis present

## 2021-11-08 DIAGNOSIS — Z882 Allergy status to sulfonamides status: Secondary | ICD-10-CM

## 2021-11-08 DIAGNOSIS — F419 Anxiety disorder, unspecified: Secondary | ICD-10-CM | POA: Diagnosis present

## 2021-11-08 DIAGNOSIS — I959 Hypotension, unspecified: Secondary | ICD-10-CM | POA: Diagnosis present

## 2021-11-08 DIAGNOSIS — Z8541 Personal history of malignant neoplasm of cervix uteri: Secondary | ICD-10-CM | POA: Diagnosis not present

## 2021-11-08 DIAGNOSIS — Z8249 Family history of ischemic heart disease and other diseases of the circulatory system: Secondary | ICD-10-CM

## 2021-11-08 DIAGNOSIS — F32A Depression, unspecified: Secondary | ICD-10-CM | POA: Diagnosis present

## 2021-11-08 LAB — CBC WITH DIFFERENTIAL/PLATELET
Abs Immature Granulocytes: 0.89 10*3/uL — ABNORMAL HIGH (ref 0.00–0.07)
Basophils Absolute: 0.1 10*3/uL (ref 0.0–0.1)
Basophils Relative: 0 %
Eosinophils Absolute: 0 10*3/uL (ref 0.0–0.5)
Eosinophils Relative: 0 %
HCT: 47 % — ABNORMAL HIGH (ref 36.0–46.0)
Hemoglobin: 17.2 g/dL — ABNORMAL HIGH (ref 12.0–15.0)
Immature Granulocytes: 3 %
Lymphocytes Relative: 4 %
Lymphs Abs: 1.1 10*3/uL (ref 0.7–4.0)
MCH: 28.8 pg (ref 26.0–34.0)
MCHC: 36.6 g/dL — ABNORMAL HIGH (ref 30.0–36.0)
MCV: 78.7 fL — ABNORMAL LOW (ref 80.0–100.0)
Monocytes Absolute: 0.7 10*3/uL (ref 0.1–1.0)
Monocytes Relative: 3 %
Neutro Abs: 24 10*3/uL — ABNORMAL HIGH (ref 1.7–7.7)
Neutrophils Relative %: 90 %
Platelets: 264 10*3/uL (ref 150–400)
RBC: 5.97 MIL/uL — ABNORMAL HIGH (ref 3.87–5.11)
RDW: 14.7 % (ref 11.5–15.5)
WBC: 26.9 10*3/uL — ABNORMAL HIGH (ref 4.0–10.5)
nRBC: 0 % (ref 0.0–0.2)

## 2021-11-08 LAB — TROPONIN I (HIGH SENSITIVITY)
Troponin I (High Sensitivity): 2 ng/L (ref <18)
Troponin I (High Sensitivity): <2 ng/L (ref <18)

## 2021-11-08 LAB — BRAIN NATRIURETIC PEPTIDE: B Natriuretic Peptide: 30 pg/mL (ref 0.0–100.0)

## 2021-11-08 LAB — COMPREHENSIVE METABOLIC PANEL
ALT: 21 U/L (ref 0–44)
AST: 15 U/L (ref 15–41)
Albumin: 3.9 g/dL (ref 3.5–5.0)
Alkaline Phosphatase: 77 U/L (ref 38–126)
Anion gap: 12 (ref 5–15)
BUN: 24 mg/dL — ABNORMAL HIGH (ref 8–23)
CO2: 24 mmol/L (ref 22–32)
Calcium: 8.9 mg/dL (ref 8.9–10.3)
Chloride: 84 mmol/L — ABNORMAL LOW (ref 98–111)
Creatinine, Ser: 1.31 mg/dL — ABNORMAL HIGH (ref 0.44–1.00)
GFR, Estimated: 45 mL/min — ABNORMAL LOW (ref >60)
Glucose, Bld: 110 mg/dL — ABNORMAL HIGH (ref 70–99)
Potassium: 4.2 mmol/L (ref 3.5–5.1)
Sodium: 120 mmol/L — ABNORMAL LOW (ref 135–145)
Total Bilirubin: 0.5 mg/dL (ref 0.3–1.2)
Total Protein: 7.6 g/dL (ref 6.5–8.1)

## 2021-11-08 LAB — CORTISOL: Cortisol, Plasma: >100 ug/dL

## 2021-11-08 LAB — MAGNESIUM: Magnesium: 2.2 mg/dL (ref 1.7–2.4)

## 2021-11-08 LAB — RESP PANEL BY RT-PCR (FLU A&B, COVID) ARPGX2
Influenza A by PCR: NEGATIVE
Influenza B by PCR: NEGATIVE
SARS Coronavirus 2 by RT PCR: NEGATIVE

## 2021-11-08 LAB — PROCALCITONIN: Procalcitonin: <0.1 ng/mL

## 2021-11-08 LAB — LACTIC ACID, PLASMA
Lactic Acid, Venous: 1.3 mmol/L (ref 0.5–1.9)
Lactic Acid, Venous: 1.5 mmol/L (ref 0.5–1.9)

## 2021-11-08 LAB — TSH: TSH: 0.782 u[IU]/mL (ref 0.350–4.500)

## 2021-11-08 LAB — PROTIME-INR
INR: 1.1 (ref 0.8–1.2)
Prothrombin Time: 13.8 seconds (ref 11.4–15.2)

## 2021-11-08 MED ORDER — ONDANSETRON HCL 4 MG PO TABS: 4.0000 mg | ORAL_TABLET | Freq: Four times a day (QID) | ORAL | Status: DC | PRN

## 2021-11-08 MED ORDER — POLYETHYLENE GLYCOL 3350 17 G PO PACK: 17.0000 g | PACK | Freq: Every day | ORAL | Status: DC | PRN

## 2021-11-08 MED ORDER — CLONAZEPAM 0.5 MG PO TABS
1.0000 mg | ORAL_TABLET | Freq: Two times a day (BID) | ORAL | Status: DC
  Administered 2021-11-08 – 2021-11-10 (×4): 1 mg via ORAL
  Filled 2021-11-08 (×4): qty 2

## 2021-11-08 MED ORDER — SODIUM CHLORIDE 0.9 % IV BOLUS
1000.0000 mL | Freq: Once | INTRAVENOUS | Status: AC
  Administered 2021-11-08: 1000 mL via INTRAVENOUS

## 2021-11-08 MED ORDER — VANCOMYCIN HCL 750 MG/150ML IV SOLN: 750.0000 mg | INTRAVENOUS | Status: DC

## 2021-11-08 MED ORDER — ALBUTEROL SULFATE (2.5 MG/3ML) 0.083% IN NEBU: 3.0000 mL | INHALATION_SOLUTION | RESPIRATORY_TRACT | Status: DC | PRN

## 2021-11-08 MED ORDER — LACTATED RINGERS IV SOLN: INTRAVENOUS | Status: DC

## 2021-11-08 MED ORDER — ARIPIPRAZOLE 10 MG PO TABS
10.0000 mg | ORAL_TABLET | Freq: Every day | ORAL | Status: DC
  Administered 2021-11-09 – 2021-11-10 (×2): 10 mg via ORAL
  Filled 2021-11-08 (×2): qty 1

## 2021-11-08 MED ORDER — VANCOMYCIN HCL IN DEXTROSE 1-5 GM/200ML-% IV SOLN
1000.0000 mg | Freq: Once | INTRAVENOUS | Status: AC
  Administered 2021-11-08: 1000 mg via INTRAVENOUS
  Filled 2021-11-08: qty 200

## 2021-11-08 MED ORDER — METRONIDAZOLE 500 MG/100ML IV SOLN
500.0000 mg | Freq: Once | INTRAVENOUS | Status: AC
  Administered 2021-11-08: 500 mg via INTRAVENOUS
  Filled 2021-11-08: qty 100

## 2021-11-08 MED ORDER — LACTATED RINGERS IV BOLUS (SEPSIS)
1000.0000 mL | Freq: Once | INTRAVENOUS | Status: AC
  Administered 2021-11-08: 1000 mL via INTRAVENOUS

## 2021-11-08 MED ORDER — ENOXAPARIN SODIUM 40 MG/0.4ML IJ SOSY
40.0000 mg | PREFILLED_SYRINGE | INTRAMUSCULAR | Status: DC
  Administered 2021-11-08 – 2021-11-09 (×2): 40 mg via SUBCUTANEOUS
  Filled 2021-11-08 (×2): qty 0.4

## 2021-11-08 MED ORDER — SODIUM CHLORIDE 0.9 % IV SOLN
2.0000 g | Freq: Two times a day (BID) | INTRAVENOUS | Status: DC
  Administered 2021-11-09: 2 g via INTRAVENOUS
  Filled 2021-11-08: qty 2

## 2021-11-08 MED ORDER — SODIUM CHLORIDE 0.9 % IV SOLN
INTRAVENOUS | Status: AC
  Administered 2021-11-08: 1000 mL via INTRAVENOUS

## 2021-11-08 MED ORDER — VENLAFAXINE HCL ER 75 MG PO CP24
300.0000 mg | ORAL_CAPSULE | Freq: Every day | ORAL | Status: DC
  Administered 2021-11-09 – 2021-11-10 (×2): 300 mg via ORAL
  Filled 2021-11-08 (×2): qty 4

## 2021-11-08 MED ORDER — HYDROCORTISONE SOD SUC (PF) 100 MG IJ SOLR
100.0000 mg | Freq: Once | INTRAMUSCULAR | Status: AC
  Administered 2021-11-08: 100 mg via INTRAVENOUS
  Filled 2021-11-08: qty 2

## 2021-11-08 MED ORDER — METRONIDAZOLE 500 MG/100ML IV SOLN
500.0000 mg | Freq: Two times a day (BID) | INTRAVENOUS | Status: DC
  Administered 2021-11-09: 500 mg via INTRAVENOUS
  Filled 2021-11-08: qty 100

## 2021-11-08 MED ORDER — LACTATED RINGERS IV BOLUS (SEPSIS)
250.0000 mL | Freq: Once | INTRAVENOUS | Status: AC
  Administered 2021-11-08: 250 mL via INTRAVENOUS

## 2021-11-08 MED ORDER — SODIUM CHLORIDE 0.9 % IV SOLN
2.0000 g | Freq: Once | INTRAVENOUS | Status: AC
  Administered 2021-11-08: 2 g via INTRAVENOUS
  Filled 2021-11-08: qty 2

## 2021-11-08 MED ORDER — ONDANSETRON HCL 4 MG/2ML IJ SOLN: 4.0000 mg | Freq: Four times a day (QID) | INTRAMUSCULAR | Status: DC | PRN

## 2021-11-08 MED ORDER — ACETAMINOPHEN 325 MG PO TABS: 650.0000 mg | ORAL_TABLET | Freq: Four times a day (QID) | ORAL | Status: DC | PRN

## 2021-11-08 MED ORDER — ACETAMINOPHEN 650 MG RE SUPP: 650.0000 mg | Freq: Four times a day (QID) | RECTAL | Status: DC | PRN

## 2021-11-08 MED ORDER — NICOTINE 14 MG/24HR TD PT24
14.0000 mg | MEDICATED_PATCH | Freq: Every day | TRANSDERMAL | Status: DC
  Administered 2021-11-08 – 2021-11-10 (×3): 14 mg via TRANSDERMAL
  Filled 2021-11-08 (×3): qty 1

## 2021-11-08 MED ORDER — LEVOTHYROXINE SODIUM 25 MCG PO TABS
125.0000 ug | ORAL_TABLET | Freq: Every day | ORAL | Status: DC
  Administered 2021-11-09 – 2021-11-10 (×2): 125 ug via ORAL
  Filled 2021-11-08 (×2): qty 1

## 2021-11-08 NOTE — ED Triage Notes (Signed)
Referred from [facility] for low blood pressure

## 2021-11-08 NOTE — ED Provider Notes (Signed)
[facility] Provider Note   CSN: [id number] Arrival date & time: 11/08/21  1418     History  Chief Complaint  Patient presents with   Hypotension    Dorothy [name] is a 66 y.o. female.  HPI Patient presents for hypotension.  Her medical history is notable for autoimmune hypothyroidism, HLD, HTN, anxiety, depression, GERD.  Listed home medications include Klonopin, HCTZ, Synthroid, lisinopril.  She takes her medications daily at 7:30 AM, including this morning.  This patient has felt fatigue and generalized weakness for the past week.  This prompted her to go to an outpatient medical clinic earlier today.  When they checked her blood pressure, it was found to be low and she was sent to the emergency department.  Currently, she denies continued fatigue and generalized weakness but denies any other symptoms.  She has not had any recent fevers or chills.  She denies any recent cough, dysuria, vomiting, diarrhea.  She states that her p.o. intake has been normal.    Home Medications Prior to Admission medications   Medication Sig Start Date End Date Taking? Authorizing Provider  albuterol (VENTOLIN HFA) 108 (90 Base) MCG/ACT inhaler Inhale into the lungs. 12/13/20  Yes [provider]  ARIPiprazole (ABILIFY) 10 MG tablet Take 10 mg by mouth daily. Takes 1/2 daily 02/16/17  Yes [provider]  aspirin EC 81 MG tablet Take 81 mg by mouth daily. Swallow whole.   Yes [provider]  CALCIUM PO Take 1 tablet by mouth daily.   Yes [provider]  clonazePAM (KLONOPIN) 0.5 MG tablet Take 1 mg by mouth 2 (two) times daily.   Yes [provider]  dextromethorphan-guaiFENesin (MUCINEX DM) 30-600 MG 12hr tablet Take 1 tablet by mouth 2 (two) times daily. 11/10/21  Yes Barton Dubois, MD  ELDERBERRY PO Take by mouth.   Yes [provider]  levothyroxine (SYNTHROID) 125 MCG tablet Take 125 mcg by mouth daily. 07/06/20  Yes  [provider]  lisinopril (ZESTRIL) 20 MG tablet Take 20 mg by mouth daily. 07/05/20  Yes [provider]  Multiple Vitamins-Minerals (MULTIVITAMINS THER. W/MINERALS) TABS tablet Take 1 tablet by mouth daily.   Yes [provider]  venlafaxine (EFFEXOR-XR) 150 MG 24 hr capsule Take 300 mg by mouth daily with breakfast.   Yes [provider]  VITAMIN D PO Take 2,000 Units by mouth daily.   Yes [provider]  zolpidem (AMBIEN) 10 MG tablet Take 10 mg by mouth at bedtime as needed for sleep. 02/04/19  Yes [provider]  amoxicillin-clavulanate (AUGMENTIN) 875-125 MG tablet Take 1 tablet by mouth every 12 (twelve) hours for 5 days. 11/10/21 11/15/21  Barton Dubois, MD  hydrochlorothiazide (HYDRODIURIL) 25 MG tablet Take 0.5 tablets (12.5 mg total) by mouth daily. 11/12/21   Barton Dubois, MD  mometasone-formoterol [facility]) 200-5 MCG/ACT AERO Inhale 2 puffs into the lungs 2 (two) times daily. 11/10/21   Barton Dubois, MD  nicotine (NICODERM CQ - DOSED IN MG/24 HOURS) 14 mg/24hr patch Place 1 patch (14 mg total) onto the skin daily. 11/11/21   Barton Dubois, MD      Allergies    Sulfa antibiotics    Review of Systems   Review of Systems  Constitutional:  Positive for fatigue. Negative for activity change, appetite change, chills, diaphoresis and fever.  HENT:  Negative for congestion, ear pain and sore throat.   Eyes:  Negative for pain and visual disturbance.  Respiratory:  Negative for cough, chest tightness, shortness of breath and wheezing.   Cardiovascular:  Negative for chest pain, palpitations and leg swelling.  Gastrointestinal:  Negative for abdominal pain, diarrhea, nausea and vomiting.  Genitourinary:  Negative for dysuria, flank pain, hematuria, pelvic pain and urgency.  Musculoskeletal:  Negative for arthralgias, back pain, joint swelling, myalgias and neck pain.  Skin:  Negative for color change and rash.  Neurological:   Positive for weakness (Generalized). Negative for dizziness, seizures, syncope, light-headedness, numbness and headaches.  Hematological:  Does not bruise/bleed easily.  Psychiatric/Behavioral:  Negative for confusion and decreased concentration.   All other systems reviewed and are negative.  Physical Exam Updated Vital Signs BP 131/73    Pulse 60    Temp 97.9 F (36.6 C)    Resp 18    Ht 5\' 5"  (1.651 m)    Wt 63.5 kg    LMP 10/23/1980 Comment: Hyst age 87(ovaries retained)   SpO2 92%    BMI 23.30 kg/m  Physical Exam Vitals and nursing note reviewed.  Constitutional:      General: She is not in acute distress.    Appearance: Normal appearance. She is well-developed and normal weight. She is not ill-appearing, toxic-appearing or diaphoretic.  HENT:     Head: Normocephalic and atraumatic.     Right Ear: External ear normal.     Left Ear: External ear normal.     Nose: Nose normal.     Mouth/Throat:     Mouth: Mucous membranes are moist.     Pharynx: Posterior oropharyngeal erythema present.     Comments: There is mild erythema and lesions in area of soft palate and oropharynx. Eyes:     General: No scleral icterus.    Extraocular Movements: Extraocular movements intact.     Conjunctiva/sclera: Conjunctivae normal.  Cardiovascular:     Rate and Rhythm: Normal rate and regular rhythm.     Heart sounds: No murmur heard. Pulmonary:     Effort: Pulmonary effort is normal. No respiratory distress.     Breath sounds: Normal breath sounds. No wheezing or rales.  Abdominal:     General: Abdomen is flat.     Palpations: Abdomen is soft.     Tenderness: There is no abdominal tenderness. There is no right CVA tenderness or left CVA tenderness.  Musculoskeletal:        General: No swelling. Normal range of motion.     Cervical back: Normal range of motion. No rigidity.     Right lower leg: No edema.     Left lower leg: No edema.  Skin:    General: Skin is warm and dry.     Capillary  Refill: Capillary refill takes less than 2 seconds.     Coloration: Skin is not jaundiced or pale.  Neurological:     General: No focal deficit present.     Mental Status: She is alert and oriented to person, place, and time.     Cranial Nerves: No cranial nerve deficit.     Sensory: No sensory deficit.     Motor: No weakness.     Coordination: Coordination normal.  Psychiatric:        Mood and Affect: Mood normal.        Behavior: Behavior normal.        Thought Content: Thought content normal.        Judgment: Judgment normal.    ED Results / Procedures / Treatments   Labs (all labs  ordered are listed, but only abnormal results are displayed) Labs Reviewed  CBC WITH DIFFERENTIAL/PLATELET - Abnormal; Notable for the following components:      Result Value   WBC 26.9 (*)    RBC 5.97 (*)    Hemoglobin 17.2 (*)    HCT 47.0 (*)    MCV 78.7 (*)    MCHC 36.6 (*)    Neutro Abs 24.0 (*)    Abs Immature Granulocytes 0.89 (*)    All other components within normal limits  COMPREHENSIVE METABOLIC PANEL - Abnormal; Notable for the following components:   Sodium 120 (*)    Chloride 84 (*)    Glucose, Bld 110 (*)    BUN 24 (*)    Creatinine, Ser 1.31 (*)    GFR, Estimated 45 (*)    All other components within normal limits  URINALYSIS, ROUTINE W REFLEX MICROSCOPIC - Abnormal; Notable for the following components:   Specific Gravity, Urine <1.005 (*)    Hgb urine dipstick TRACE (*)    All other components within normal limits  BASIC METABOLIC PANEL - Abnormal; Notable for the following components:   Sodium 131 (*)    Calcium 8.5 (*)    All other components within normal limits  CBC - Abnormal; Notable for the following components:   WBC 18.0 (*)    MCV 77.7 (*)    All other components within normal limits  CBC - Abnormal; Notable for the following components:   WBC 10.6 (*)    MCV 79.2 (*)    All other components within normal limits  BASIC METABOLIC PANEL - Abnormal; Notable  for the following components:   Sodium 133 (*)    Glucose, Bld 68 (*)    Calcium 8.0 (*)    All other components within normal limits  RESP PANEL BY RT-PCR (FLU A&B, COVID) ARPGX2  CULTURE, BLOOD (ROUTINE X 2)  CULTURE, BLOOD (ROUTINE X 2)  URINE CULTURE  TSH  BRAIN NATRIURETIC PEPTIDE  LACTIC ACID, PLASMA  MAGNESIUM  LACTIC ACID, PLASMA  CORTISOL  PROTIME-INR  PROCALCITONIN  URINALYSIS, MICROSCOPIC (REFLEX)  TROPONIN I (HIGH SENSITIVITY)  TROPONIN I (HIGH SENSITIVITY)    EKG EKG Interpretation  Date/Time:  Tuesday November 08 2021 15:37:05 EST Ventricular Rate:  74 PR Interval:  185 QRS Duration: 90 QT Interval:  369 QTC Calculation: 410 R Axis:   80 Text Interpretation: Sinus rhythm Biatrial enlargement Minimal ST elevation, inferior leads Confirmed by Godfrey Pick [phone]) on 11/08/2021 3:43:21 PM  Radiology DG Chest 2 View  Result Date: 11/09/2021 CLINICAL DATA:  Generalized weakness over the past week. EXAM: CHEST - 2 VIEW COMPARISON:  Chest radiograph dated November 08, 2021. FINDINGS: The heart size and mediastinal contours are within normal limits. Questionable nodular densities projecting over the right upper lobe. The visualized skeletal structures are unremarkable. IMPRESSION: Questionable nodular densities projecting over the right upper lobe of indeterminate significance. It may be due to overlapping of soft tissues. Further evaluation with CT examination would be helpful. No acute cardiopulmonary process. Electronically Signed   By: Keane Police D.O.   On: 11/09/2021 10:05   DG Chest Port 1 View  Result Date: 11/08/2021 CLINICAL DATA:  Questionable sepsis. EXAM: PORTABLE CHEST 1 VIEW COMPARISON:  Chest x-ray 04/27/2014. FINDINGS: There are 2 nodular densities projecting over the lateral right upper lung which may be related to overlying lead artifact. Lungs otherwise appear clear. There is no pleural effusion or pneumothorax. Cardiomediastinal silhouette is within  normal limits.  No acute fractures are seen. IMPRESSION: 1. Questionable nodular densities versus overlying lead artifact in the right upper lobe. Recommend follow-up PA and lateral chest x-ray without overlying cardiac leads or follow-up nonemergent chest CT. 2. No evidence for pneumonia or edema. Electronically Signed   By: Ronney Asters M.D.   On: 11/08/2021 15:55    Procedures Procedures    Medications Ordered in ED Medications  nicotine (NICODERM CQ - dosed in mg/24 hours) patch 14 mg (14 mg Transdermal Patch Applied 11/10/21 0808)  enoxaparin (LOVENOX) injection 40 mg (40 mg Subcutaneous Given 11/09/21 2103)  0.9 %  sodium chloride infusion (0 mLs Intravenous Stopped 11/09/21 1750)  acetaminophen (TYLENOL) tablet 650 mg (has no administration in time range)    Or  acetaminophen (TYLENOL) suppository 650 mg (has no administration in time range)  ondansetron (ZOFRAN) tablet 4 mg (has no administration in time range)    Or  ondansetron (ZOFRAN) injection 4 mg (has no administration in time range)  polyethylene glycol (MIRALAX / GLYCOLAX) packet 17 g (has no administration in time range)  albuterol (PROVENTIL) (2.5 MG/3ML) 0.083% nebulizer solution 3 mL (has no administration in time range)  ARIPiprazole (ABILIFY) tablet 10 mg (10 mg Oral Given 11/10/21 0804)  clonazePAM (KLONOPIN) tablet 1 mg (1 mg Oral Given 11/10/21 0804)  levothyroxine (SYNTHROID) tablet 125 mcg (125 mcg Oral Given 11/10/21 0532)  venlafaxine XR (EFFEXOR-XR) 24 hr capsule 300 mg (300 mg Oral Given 11/10/21 0804)  amoxicillin-clavulanate (AUGMENTIN) 875-125 MG per tablet 1 tablet (1 tablet Oral Given 11/10/21 0803)  0.9 %  sodium chloride infusion (0 mLs Intravenous Stopped 11/10/21 0809)  mometasone-formoterol (DULERA) 200-5 MCG/ACT inhaler 2 puff (2 puffs Inhalation Given 11/10/21 0738)  sodium chloride 0.9 % bolus 1,000 mL (0 mLs Intravenous Stopped 11/08/21 1631)  lactated ringers bolus 1,000 mL (0 mLs Intravenous Stopped  11/08/21 1631)    And  lactated ringers bolus 250 mL (0 mLs Intravenous Stopped 11/08/21 1608)  hydrocortisone sodium succinate (SOLU-CORTEF) 100 MG injection 100 mg (100 mg Intravenous Given 11/08/21 1638)  ceFEPIme (MAXIPIME) 2 g in sodium chloride 0.9 % 100 mL IVPB (0 g Intravenous Stopped 11/08/21 1709)  metroNIDAZOLE (FLAGYL) IVPB 500 mg (0 mg Intravenous Stopped 11/08/21 1742)  vancomycin (VANCOCIN) IVPB 1000 mg/200 mL premix (0 mg Intravenous Stopped 11/08/21 1744)    ED Course/ Medical Decision Making/ A&P                           Medical Decision Making Amount and/or Complexity of Data Reviewed Labs: ordered. Radiology: ordered. ECG/medicine tests: ordered.  Risk Prescription drug management. Decision regarding hospitalization.   This patient presents to the ED for concern of hypotension, this involves an extensive number of treatment options, and is a complaint that carries with it a high risk of complications and morbidity.  The differential diagnosis includes sepsis, CHF, cardiac valvular disease, polypharmacy, adrenal insufficiency, dehydration, acute anemia.   Co morbidities that complicate the patient evaluation  autoimmune hypothyroidism, HLD, HTN, anxiety, depression, GERD   Additional history obtained:  Additional history obtained from patient's brother, who does states she had a recent cough. External records from outside source obtained and reviewed including EMR, paperwork from previous clinic visit   Lab Tests:  I Ordered, and personally interpreted labs.  The pertinent results include: Leukocytosis concerning for acute infection and/or stress reaction; hemoconcentration evident on CBC, hyponatremia (new), AKI.  Imaging Studies ordered:  I ordered imaging studies including  chest x-ray I independently visualized and interpreted imaging which showed questionable nodular densities but otherwise no acute findings I agree with the radiologist  interpretation   Cardiac Monitoring:  The patient was maintained on a cardiac monitor.  I personally viewed and interpreted the cardiac monitored which showed an underlying rhythm of: Sinus rhythm   Medicines ordered and prescription drug management:  I ordered medication including IV fluids for dehydration and concern of sepsis; broad-spectrum antibiotics for empiric treatment of infection of unknown source; Solu-Cortef for empiric treatment of adrenal insufficiency Reevaluation of the patient after these medicines showed that the patient stayed the same I have reviewed the patients home medicines and have made adjustments as needed  Problem List / ED Course:  Healthy 66 year old female draining for concern of hypotension.  This was identified at outpatient clinic visit earlier today.  She has had 1 week of fatigue and generalized weakness which prompted her outpatient visit today.  On arrival in the ED, patient's blood pressure remains low.  Etiology of this is undetermined at this time.  She does take 2 home antihypertensives, which she took today at 69.  She has not had any recent dosing changes.  She states that her p.o. intake has been normal.  She has not had vomiting or diarrhea.  IV fluids were initiated and the patient underwent a laboratory work-up.  Laboratory work-up was concerning for large leukocytosis.  Patient remained hypotensive following 1 L of IV fluid.  Despite her hypotension, lactic acid remains normal.  While in the ED, patient was accompanied at bedside by her brother.  Her brother states that she has had a recent cough which has recently improved.  Patient's other lab work is notable for a hyponatremia of 120.  Given this in the setting of unexplained hypotension, patient may be experiencing adrenal insufficiency.  100 mg of Solu-Cortef was ordered.  Patient will require hospitalization for continued treatment and further diagnostic testing.   Reevaluation:  After the  interventions noted above, I reevaluated the patient and found that they have :stayed the same   Social Determinants of Health:  Patient has access to outpatient medical care, including a PCP.   Dispostion:  After consideration of the diagnostic results and the patients response to treatment, I feel that the patent would benefit from admission to the hospital.   CRITICAL CARE Performed by: Godfrey Pick   Total critical care time: 35 minutes  Critical care time was exclusive of separately billable procedures and treating other patients.  Critical care was necessary to treat or prevent imminent or life-threatening deterioration.  Critical care was time spent personally by me on the following activities: development of treatment plan with patient and/or surrogate as well as nursing, discussions with consultants, evaluation of patient's response to treatment, examination of patient, obtaining history from patient or surrogate, ordering and performing treatments and interventions, ordering and review of laboratory studies, ordering and review of radiographic studies, pulse oximetry and re-evaluation of patient's condition.          Final Clinical Impression(s) / ED Diagnoses Final diagnoses:  AKI (acute kidney injury) ([place])  Hypotension, unspecified hypotension type  Hyponatremia    Rx / DC Orders ED Discharge Orders          Ordered    hydrochlorothiazide (HYDRODIURIL) 25 MG tablet  Daily        11/10/21 0955    nicotine (NICODERM CQ - DOSED IN MG/24 HOURS) 14 mg/24hr patch  Daily  11/10/21 0955    mometasone-formoterol (DULERA) 200-5 MCG/ACT AERO  2 times daily        11/10/21 0955    amoxicillin-clavulanate (AUGMENTIN) 875-125 MG tablet  Every 12 hours        11/10/21 0955    Increase activity slowly        11/10/21 0955    Diet - low sodium heart healthy        11/10/21 0955    Discharge instructions       Comments: Arrange follow-up with PCP in 10 days Stop  smoking Maintain adequate hydration Follow heart healthy diet Be compliant with medications, especially inhaler management   11/10/21 0955    dextromethorphan-guaiFENesin (MUCINEX DM) 30-600 MG 12hr tablet  2 times daily        11/10/21 0955              Godfrey Pick, MD 11/10/21 1312

## 2021-11-08 NOTE — Plan of Care (Signed)
Patient alert and oriented. No acute distress noted. Settled into room, admission assessment completed. Will continue to monitor as per orders.

## 2021-11-08 NOTE — Progress Notes (Signed)
Pharmacy Antibiotic Note  EVOLEHT HOVATTER is a 66 y.o. female admitted on 11/08/2021 for hypotension now with concerns for sepsis.  Pharmacy has been consulted for vancomycin and cefepime dosing.  Unsure baseline renal function. Scr elevated at 1.31. Previous Scr ~0.6-0.7 in 2017 (none recently per chart review). Will dose cautiously and continue to monitor.  Plan: Cefepime 2g Q12h Vancomycin 1000mg  x1, then vancomycin 750mg  Q24h (eAUC 436, Scr 1.31, goal AUC 400-550)   Temp (24hrs), Avg:97.9 F (36.6 C), Min:97.5 F (36.4 C), Max:98.2 F (36.8 C)  Recent Labs  Lab 11/08/21 1522 11/08/21 1537 11/08/21 1715  WBC 26.9*  --   --   CREATININE 1.31*  --   --   LATICACIDVEN  --  1.3 1.5    Estimated Creatinine Clearance: 40.2 mL/min (A) (by C-G formula based on SCr of 1.31 mg/dL (H)).    Allergies  Allergen Reactions   Sulfa Antibiotics Rash     Microbiology results: 01/17 BCx: pending 01/17 UCx: pending    Thank you for allowing pharmacy to be a part of this patients care.  Ardyth Harps, PharmD Clinical Pharmacist

## 2021-11-08 NOTE — H&P (Addendum)
History and Physical    Dorothy [name] FYB:[id number] DOB: 1956-10-22 DOA: 11/08/2021  PCP: Renee Rival, NP   Patient coming from: Home  I have personally briefly reviewed patient's old medical records in [place]  Chief Complaint: low blood pressure  HPI: Dorothy [name] is a 66 y.o. female with medical history significant for hypertension, COPD, hypothyroidism, depression, avascular necrosis. Patient presented to the ED with complaints of generalized weakness over the past week.  She denies dizziness.  No chest pain.  No difficulty breathing.  She is a smoker, she has chronic cough, but about a week ago she had worsening of her cough, but this is resolving now.  She reports good oral intake, no vomiting no loose stools, no abdominal pain, no pain with urination..  She reports she drinks a lot of diet [facility], more than 4 to 5 cans a day.  Patient went to see her primary care provider, she was referred to the ED due to low blood pressure.  ED Course: Temperature 97.5.  Heart rate 72-83.  Blood pressure down to 78/60, improved after 1 L bolus.  Respiratory rate 15-20.  WBC 26.  Sodium 120.  Lactic acid 1.3 > 1.5.  UA pending. Blood cultures obtained. IV Vanco cefepime and metronidazole started.  Blood cultures ordered.  Hospitalist to admit for hyponatremia, SIRS.  Review of Systems: As per HPI all other systems reviewed and negative.  Past Medical History:  Diagnosis Date   Anxiety    Arthritis    hands and Knees   Avascular necrosis of bones of both hips ([place]) 01/17/2012   Benign essential HTN 01/17/2012   Cancer ([place]) 1982   ovarian   Cancer of cervix ([place])    Constipation    Depression    Takes Klonopin   Depression with anxiety 01/17/2012   GERD (gastroesophageal reflux disease)    Hypercholesteremia    Hypertension    takes meds   Hypothyroidism    on Levonthyroxine   Hypothyroidism (acquired) 01/17/2012   Since age 78   Osteopenia 2018    forearm   Osteoporosis 07/25/2021   forearm   Ovarian cyst 01/17/2012   1995  BSO   Personal history of colonic polyps 11/07/2007   Pneumonia    x 2- last time > 5 years ago   Pneumonia 01/17/2012   10 yrs ago and 5 years ago    Rash and other nonspecific skin eruption 01/17/2012   Dorsum hands & extensor surfaces arms; intermittent    Past Surgical History:  Procedure Laterality Date   ABDOMINAL HYSTERECTOMY  1982   ovaries retained--for CIS   CELIAC ARTERY BYPASS  09/25/2012   Procedure: CELIAC ARTERY BYPASS;  Surgeon: Elam Dutch, MD;  Location: [place];  Service: Vascular;  Laterality: N/A;  Bypass using Hemashield Gold Vascular Graft 87mm x 48mm x 40cm   COLONOSCOPY  2009   [place]-polyps, 5 yr FU   JOINT REPLACEMENT     MESENTERIC ARTERY BYPASS  09/25/2012   Procedure: MESENTERIC ARTERY BYPASS;  Surgeon: Elam Dutch, MD;  Location: 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   TOTAL HIP ARTHROPLASTY  09/06/2011   Procedure: TOTAL HIP ARTHROPLASTY;  Surgeon: Ninetta Lights, MD;  Location: [facility];  Service: Orthopedics;  Laterality: Left;  TOTAL HIP ARTHROPLASTY LEFT SIDE   TOTAL HIP ARTHROPLASTY  10/11/2011   Procedure: TOTAL HIP ARTHROPLASTY;  Surgeon: Ninetta Lights, MD;  Location: [place];  Service: Orthopedics;  Laterality: Right;  120 MINUTES FOR THIS SURGERY   WOUND EXPLORATION  10/02/2012   Procedure: WOUND EXPLORATION;  Surgeon: Elam Dutch, MD;  Location: [facility] OR;  Service: Vascular;  Laterality: N/A;  abdominal wound exploration     reports that she has been smoking cigarettes. She has a 40.00 pack-year smoking history. She has never used smokeless tobacco. She reports that she does not drink alcohol and does not use drugs.  Allergies  Allergen Reactions   Sulfa Antibiotics Rash    Family History  Problem  Relation Age of Onset   Breast cancer Sister 49       dec age 26   Cancer Sister    Cancer Mother        lung   Heart failure Father    Hypertension Son    Ovarian cancer Paternal Grandmother        dec age 55   Heart attack Brother 57   Anesthesia problems Neg Hx    Hypotension Neg Hx    Malignant hyperthermia Neg Hx    Pseudochol deficiency Neg Hx    Colon cancer Neg Hx    Esophageal cancer Neg Hx    Rectal cancer Neg Hx    Stomach cancer Neg Hx     Prior to Admission medications   Medication Sig Start Date End Date Taking? Authorizing Provider  ADVAIR DISKUS 250-50 MCG/DOSE AEPB Inhale 1 puff into the lungs daily as needed (for shortness of breath).    Yes [provider]  albuterol (VENTOLIN HFA) 108 (90 Base) MCG/ACT inhaler Inhale into the lungs. 12/13/20  Yes [provider]  ARIPiprazole (ABILIFY) 10 MG tablet Take 10 mg by mouth daily. Takes 1/2 daily 02/16/17  Yes [provider]  aspirin EC 81 MG tablet Take 81 mg by mouth daily. Swallow whole.   Yes [provider]  CALCIUM PO Take 1 tablet by mouth daily.   Yes [provider]  clonazePAM (KLONOPIN) 0.5 MG tablet Take 1 mg by mouth 2 (two) times daily.   Yes [provider]  ELDERBERRY PO Take by mouth.   Yes [provider]  hydrochlorothiazide (HYDRODIURIL) 25 MG tablet Take 25 mg by mouth daily.   Yes [provider]  ibuprofen (ADVIL) 800 MG tablet Take by mouth. 03/11/21  Yes [provider]  levothyroxine (SYNTHROID) 125 MCG tablet Take 125 mcg by mouth daily. 07/06/20  Yes [provider]  lisinopril (ZESTRIL) 20 MG tablet Take 20 mg by mouth daily. 07/05/20  Yes [provider]  Multiple Vitamins-Minerals (MULTIVITAMINS THER. W/MINERALS) TABS tablet Take 1 tablet by mouth daily.   Yes [provider]  venlafaxine (EFFEXOR-XR) 150 MG 24 hr capsule Take 300 mg by mouth daily with breakfast.   Yes [provider]  VITAMIN D PO Take 2,000 Units by mouth daily.   Yes [provider]  zolpidem (AMBIEN) 10 MG tablet Take 10 mg by mouth at bedtime as needed for sleep. 02/04/19  Yes [provider]  levofloxacin (LEVAQUIN) 500 MG tablet Take 500 mg by mouth daily. Patient not taking: Reported on 11/08/2021 10/31/21   [provider]  predniSONE (DELTASONE) 20 MG tablet Take 20 mg by mouth. tapper Patient not taking: Reported on 11/08/2021 10/31/21   [provider]    Physical Exam: Vitals:   11/08/21 1515 11/08/21 1615 11/08/21 1715 11/08/21 1730  BP: (!) 78/60 94/60 107/64 92/64  Pulse: 80 76 73 72  Resp: 17 16 15 17   Temp:      TempSrc:      SpO2: 94% 94% 91% 92%    Constitutional: NAD, calm, comfortable Vitals:   11/08/21 1515 11/08/21 1615 11/08/21 1715 11/08/21 1730  BP: (!) 78/60 94/60 107/64 92/64  Pulse: 80 76 73 72  Resp: 17 16 15 17   Temp:      TempSrc:      SpO2: 94% 94% 91% 92%   Eyes: PERRL, lids and conjunctivae normal ENMT: Mucous membranes are moist.   Neck: normal, supple, no masses, no thyromegaly Respiratory: clear to auscultation bilaterally, no wheezing, no crackles. Normal respiratory effort. No accessory muscle use.  Cardiovascular: Regular rate and rhythm, no murmurs / rubs / gallops. No extremity edema. 2+ pedal pulses.  Abdomen: no tenderness, no masses palpated. No hepatosplenomegaly. Bowel sounds positive.  Musculoskeletal: no clubbing / cyanosis. No joint deformity upper and lower extremities. Good ROM, no contractures. Normal muscle tone.  Skin: no rashes, lesions, ulcers. No induration Neurologic: No apparent cranial abnormality, moving extremities spontaneously. Psychiatric: Normal judgment and insight. Alert and oriented x 3. Normal mood.   Labs on Admission: I have personally reviewed following labs and imaging studies  CBC: Recent Labs  Lab 11/08/21 1522  WBC 26.9*  NEUTROABS 24.0*  HGB 17.2*  HCT 47.0*   MCV 78.7*  PLT 081   Basic Metabolic Panel: Recent Labs  Lab 11/08/21 1522  NA 120*  K 4.2  CL 84*  CO2 24  GLUCOSE 110*  BUN 24*  CREATININE 1.31*  CALCIUM 8.9  MG 2.2   Liver Function Tests: Recent Labs  Lab 11/08/21 1522  AST 15  ALT 21  ALKPHOS 77  BILITOT 0.5  PROT 7.6  ALBUMIN 3.9   Coagulation Profile: Recent Labs  Lab 11/08/21 1715  INR 1.1   Thyroid Function Tests: Recent Labs    11/08/21 1522  TSH 0.782   Radiological Exams on Admission: DG Chest Port 1 View  Result Date: 11/08/2021 CLINICAL DATA:  Questionable sepsis. EXAM: PORTABLE CHEST 1 VIEW COMPARISON:  Chest x-ray 04/27/2014. FINDINGS: There are 2 nodular densities projecting over the lateral right upper lung which may be related to overlying lead artifact. Lungs otherwise appear clear. There is no pleural effusion or pneumothorax. Cardiomediastinal silhouette is within normal limits. No acute fractures are seen. IMPRESSION: 1. Questionable nodular densities versus overlying lead artifact in the right upper lobe. Recommend follow-up PA and lateral chest x-ray without overlying cardiac leads or follow-up nonemergent chest CT. 2. No evidence for pneumonia or edema. Electronically Signed   By: Ronney Asters M.D.   On: 11/08/2021 15:55    EKG: Independently reviewed.  Sinus rhythm rate 74, QTc 410.  No significant change from prior.  Assessment/Plan Principal Problem:   Hyponatremia Active Problems:   Hypotension   Hypothyroidism (acquired)   Benign essential HTN   Depression with anxiety   Hyponatremia-sodium 120, last checked 5 years ago, baseline sodium  133- 140.  Reporting generalized fatigue at this time, otherwise no other symptoms.  Denies GI losses.  Likely combination of HCTZ, and large amount of diet [facility] patient drinks every day. - 1.25 L LR bolu+ maintenance fluids, d/c LR, continue N/s 100cc/hr x 20hrs -Patient counseled to limit [facility] intake -Hold HCTZ for  now  Hypotension-blood pressure down to 78/60, etiology as yet undetermined.  Significant leukocytosis of 26.  Normal lactic acid 1.3.  Patient is otherwise well-appearing.  Differentials include antihypertensives, infectious etiology versus adrenal insufficiency. -She is currently on a steroid taper, last dose this morning, she was started on 20 mg of prednisone 1/9.  This may be related to her hypotension. - Chest X-ray showing questionable densities RUL overlying lead artifact, follow up requested -Continue broad-spectrum antibiotics IV vancomycin cefepime and metronidazole -Follow-up blood and urine cultures -UA pending - Hydrate - Check procalcitonin - Hold HCTZ and lisinopril - IV Solucortef 100mg  x 1 given in ED., hold further steroids for now - Follow up cortisol levels - Repeat Chest xray- PA and lateral in a.m  COPD, ongoing tobacco use.  40-pack-year smoking history.  She is not ready at this time.  But knows she should quit. - Counseled to quit smoking.   -Nicotine patch - PRN albuterol  Depression/anxiety -Resume venlafaxine, clonazepam, Abilify  DVT prophylaxis: Lovenox Code Status: Full code Family Communication: None at bedside Disposition Plan: ~ 2days Consults called: None Admission status: Inpt, tele I certify that at the point of admission it is my clinical judgment that the patient will require inpatient hospital care spanning beyond 2 midnights from the point of admission due to high intensity of service, high risk for further deterioration and high frequency of surveillance required.   Bethena Roys MD [facility]  11/08/2021, 6:47 PM

## 2021-11-09 ENCOUNTER — Inpatient Hospital Stay (HOSPITAL_COMMUNITY): Payer: Medicare HMO

## 2021-11-09 DIAGNOSIS — E871 Hypo-osmolality and hyponatremia: Principal | ICD-10-CM

## 2021-11-09 DIAGNOSIS — F418 Other specified anxiety disorders: Secondary | ICD-10-CM

## 2021-11-09 DIAGNOSIS — I959 Hypotension, unspecified: Secondary | ICD-10-CM

## 2021-11-09 DIAGNOSIS — J189 Pneumonia, unspecified organism: Secondary | ICD-10-CM

## 2021-11-09 DIAGNOSIS — J479 Bronchiectasis, uncomplicated: Secondary | ICD-10-CM

## 2021-11-09 DIAGNOSIS — I10 Essential (primary) hypertension: Secondary | ICD-10-CM

## 2021-11-09 DIAGNOSIS — E039 Hypothyroidism, unspecified: Secondary | ICD-10-CM

## 2021-11-09 LAB — CBC
HCT: 39.3 % (ref 36.0–46.0)
Hemoglobin: 13.8 g/dL (ref 12.0–15.0)
MCH: 27.3 pg (ref 26.0–34.0)
MCHC: 35.1 g/dL (ref 30.0–36.0)
MCV: 77.7 fL — ABNORMAL LOW (ref 80.0–100.0)
Platelets: 206 10*3/uL (ref 150–400)
RBC: 5.06 MIL/uL (ref 3.87–5.11)
RDW: 13.9 % (ref 11.5–15.5)
WBC: 18 10*3/uL — ABNORMAL HIGH (ref 4.0–10.5)
nRBC: 0 % (ref 0.0–0.2)

## 2021-11-09 LAB — BASIC METABOLIC PANEL
Anion gap: 9 (ref 5–15)
BUN: 22 mg/dL (ref 8–23)
CO2: 24 mmol/L (ref 22–32)
Calcium: 8.5 mg/dL — ABNORMAL LOW (ref 8.9–10.3)
Chloride: 98 mmol/L (ref 98–111)
Creatinine, Ser: 0.9 mg/dL (ref 0.44–1.00)
GFR, Estimated: >60 mL/min (ref >60)
Glucose, Bld: 99 mg/dL (ref 70–99)
Potassium: 3.9 mmol/L (ref 3.5–5.1)
Sodium: 131 mmol/L — ABNORMAL LOW (ref 135–145)

## 2021-11-09 MED ORDER — AMOXICILLIN-POT CLAVULANATE 875-125 MG PO TABS
1.0000 | ORAL_TABLET | Freq: Two times a day (BID) | ORAL | Status: DC
  Administered 2021-11-09 – 2021-11-10 (×2): 1 via ORAL
  Filled 2021-11-09 (×2): qty 1

## 2021-11-09 MED ORDER — MOMETASONE FURO-FORMOTEROL FUM 200-5 MCG/ACT IN AERO
2.0000 | INHALATION_SPRAY | Freq: Two times a day (BID) | RESPIRATORY_TRACT | Status: DC
  Administered 2021-11-09 – 2021-11-10 (×2): 2 via RESPIRATORY_TRACT
  Filled 2021-11-09: qty 8.8

## 2021-11-09 MED ORDER — SODIUM CHLORIDE 0.9 % IV SOLN: INTRAVENOUS | Status: AC

## 2021-11-09 NOTE — Progress Notes (Signed)
PROGRESS NOTE    Dorothy [name]  BHA:[id number] DOB: 05-04-1956 DOA: 11/08/2021 PCP: Renee Rival, NP    Chief Complaint  Patient presents with   Hypotension    Brief Narrative:  As per H&P written by Dr. Denton Brick on 11/08/2021 Dorothy [name] is a 66 y.o. female with medical history significant for hypertension, COPD, hypothyroidism, depression, avascular necrosis. Patient presented to the ED with complaints of generalized weakness over the past week.  She denies dizziness.  No chest pain.  No difficulty breathing.  She is a smoker, she has chronic cough, but about a week ago she had worsening of her cough, but this is resolving now.  She reports good oral intake, no vomiting no loose stools, no abdominal pain, no pain with urination..  She reports she drinks a lot of diet [facility], more than 4 to 5 cans a day.   Patient went to see her primary care provider, she was referred to the ED due to low blood pressure.   ED Course: Temperature 97.5.  Heart rate 72-83.  Blood pressure down to 78/60, improved after 1 L bolus.  Respiratory rate 15-20.  WBC 26.  Sodium 120.  Lactic acid 1.3 > 1.5.  UA pending. Blood cultures obtained. IV Vanco cefepime and metronidazole started.  Blood cultures ordered.  Hospitalist to admit for hyponatremia, SIRS.  Assessment & Plan: 1-hyponatremia -Improved with hydration -Continue to follow electrolytes trend -Patient reeducated about the importance of maintaining regular hydration and minimizing the use of soft drinks. -Continue holding HCTZ.  2-hypertension -In the setting of dehydration and continued use of antihypertensive agents/diuretics. -Normal cortisol -Continue holding the use of his steroids -Continue holding antihypertensive agent -Advised to maintain adequate hydration -Discontinuing broad-spectrum antibiotics at this point; and narrowing treatment for bronchiectasis/pneumonia -Continue to follow clinical response. -Patient  oriented x3 and otherwise hemodynamically stable.  3-history of COPD/ongoing tobacco abuse -Cessation counseling provided -Continue nicotine patch -Continue bronchodilator -Started on Dulera -Patient has used finish steroids tapering as part of acute COPD management as an outpatient. -Continue antibiotics for bronchiectasis/pneumonia as mentioned below.  4-bronchiectasis/right lower lobe pneumonia -We will continue treatment with Augmentin -Continue bronchodilators and initiation of Dulera -Patient advised to use flutter valve.  5-depression/anxiety -Continue Effexor, Abilify and as needed clonazepam. -Overall mood stable.  6-hypothyroidism -Continue Synthroid.   DVT prophylaxis: Lovenox Code Status: Full code Family Communication: No family at bedside. Disposition:   Status is: Inpatient    Consultants:  None  Procedures:  See below for x-ray report.  Antimicrobials:  Vancomycin/cefepime at time of admission Augmentin.   Subjective: Afebrile, no chest pain, no nausea or vomiting.  Reports breathing is a stable.  Intermittent coughing spells appreciated reported.  Objective: Vitals:   11/08/21 1900 11/08/21 2219 11/09/21 0507 11/09/21 1600  BP: 105/66 125/73 139/77 108/70  Pulse: 71 66 75 73  Resp:  20 15 18   Temp: 98.2 F (36.8 C) 97.7 F (36.5 C) 98.2 F (36.8 C) 98.5 F (36.9 C)  TempSrc: Oral Oral Oral Oral  SpO2: 92% 90% 92% 99%  Weight:      Height:        Intake/Output Summary (Last 24 hours) at 11/09/2021 1855 Last data filed at 11/09/2021 1750 Gross per 24 hour  Intake 928.4 ml  Output --  Net 928.4 ml   Filed Weights   11/08/21 1842  Weight: 63.5 kg    Examination:  General exam: Appears calm and comfortable; reports intermittent coughing spells, no nausea,  no vomiting, stable breathing. Respiratory system: Good air movement bilaterally; no expiratory wheezing, positive rhonchi.  No using accessory muscle.  Good saturation on room  air. Cardiovascular system: S1 & S2 heard, RRR. No JVD, murmurs, rubs, gallops or clicks. No pedal edema. Gastrointestinal system: Abdomen is nondistended, soft and nontender. No organomegaly or masses felt. Normal bowel sounds heard. Central nervous system: Alert and oriented. No focal neurological deficits. Extremities: No cyanosis or clubbing. Skin: No petechiae. Psychiatry: Judgement and insight appear normal. Mood & affect appropriate.     Data Reviewed: I have personally reviewed following labs and imaging studies  CBC: Recent Labs  Lab 11/08/21 1522 11/09/21 0628  WBC 26.9* 18.0*  NEUTROABS 24.0*  --   HGB 17.2* 13.8  HCT 47.0* 39.3  MCV 78.7* 77.7*  PLT 264 619    Basic Metabolic Panel: Recent Labs  Lab 11/08/21 1522 11/09/21 0628  NA 120* 131*  K 4.2 3.9  CL 84* 98  CO2 24 24  GLUCOSE 110* 99  BUN 24* 22  CREATININE 1.31* 0.90  CALCIUM 8.9 8.5*  MG 2.2  --     GFR: Estimated Creatinine Clearance: 56.8 mL/min (by C-G formula based on SCr of 0.9 mg/dL).  Liver Function Tests: Recent Labs  Lab 11/08/21 1522  AST 15  ALT 21  ALKPHOS 77  BILITOT 0.5  PROT 7.6  ALBUMIN 3.9    CBG: No results for input(s): GLUCAP in the last 168 hours.   Recent Results (from the past 240 hour(s))  Resp Panel by RT-PCR (Flu A&B, Covid) Nasopharyngeal Swab     Status: None   Collection Time: 11/08/21  4:04 PM   Specimen: Nasopharyngeal Swab; Nasopharyngeal(NP) swabs in vial transport medium  Result Value Ref Range Status   SARS Coronavirus 2 by RT PCR NEGATIVE NEGATIVE Final    Comment: (NOTE) SARS-CoV-2 target nucleic acids are NOT DETECTED.  The SARS-CoV-2 RNA is generally detectable in upper respiratory specimens during the acute phase of infection. The lowest concentration of SARS-CoV-2 viral copies this assay can detect is 138 copies/mL. A negative result does not preclude SARS-Cov-2 infection and should not be used as the sole basis for treatment  or other patient management decisions. A negative result may occur with  improper specimen collection/handling, submission of specimen other than nasopharyngeal swab, presence of viral mutation(s) within the areas targeted by this assay, and inadequate number of viral copies(<138 copies/mL). A negative result must be combined with clinical observations, patient history, and epidemiological information. The expected result is Negative.  Fact Sheet for Patients:  EntrepreneurPulse.com.au  Fact Sheet for Healthcare Providers:  IncredibleEmployment.be  This test is no t yet approved or cleared by the [place] FDA and  has been authorized for detection and/or diagnosis of SARS-CoV-2 by FDA under an Emergency Use Authorization (EUA). This EUA will remain  in effect (meaning this test can be used) for the duration of the COVID-19 declaration under Section 564(b)(1) of the Act, 21 U.S.C.section 360bbb-3(b)(1), unless the authorization is terminated  or revoked sooner.       Influenza A by PCR NEGATIVE NEGATIVE Final   Influenza B by PCR NEGATIVE NEGATIVE Final    Comment: (NOTE) The Xpert Xpress SARS-CoV-2/FLU/RSV plus assay is intended as an aid in the diagnosis of influenza from Nasopharyngeal swab specimens and should not be used as a sole basis for treatment. Nasal washings and aspirates are unacceptable for Xpert Xpress SARS-CoV-2/FLU/RSV testing.  Fact Sheet for Patients: EntrepreneurPulse.com.au  Fact Sheet  for Healthcare Providers: IncredibleEmployment.be  This test is not yet approved or cleared by the [place] and has been authorized for detection and/or diagnosis of SARS-CoV-2 by FDA under an Emergency Use Authorization (EUA). This EUA will remain in effect (meaning this test can be used) for the duration of the COVID-19 declaration under Section 564(b)(1) of the Act, 21 U.S.C. section  360bbb-3(b)(1), unless the authorization is terminated or revoked.  Performed at [facility], [address], [place], [place] [address]   Blood Culture (routine x 2)     Status: None (Preliminary result)   Collection Time: 11/08/21  4:04 PM   Specimen: BLOOD LEFT FOREARM  Result Value Ref Range Status   Specimen Description BLOOD LEFT FOREARM  Final   Special Requests   Final    BOTTLES DRAWN AEROBIC AND ANAEROBIC Blood Culture adequate volume   Culture   Final    NO GROWTH < 24 HOURS Performed at [facility], [address], [place], [place] [address]    Report Status PENDING  Incomplete  Blood Culture (routine x 2)     Status: None (Preliminary result)   Collection Time: 11/08/21  4:04 PM   Specimen: Right Antecubital; Blood  Result Value Ref Range Status   Specimen Description RIGHT ANTECUBITAL  Final   Special Requests   Final    BOTTLES DRAWN AEROBIC AND ANAEROBIC Blood Culture adequate volume   Culture   Final    NO GROWTH < 24 HOURS Performed at [facility], [address] [place], [place] [address]    Report Status PENDING  Incomplete     Radiology Studies: DG Chest 2 View  Result Date: 11/09/2021 CLINICAL DATA:  Generalized weakness over the past week. EXAM: CHEST - 2 VIEW COMPARISON:  Chest radiograph dated November 08, 2021. FINDINGS: The heart size and mediastinal contours are within normal limits. Questionable nodular densities projecting over the right upper lobe. The visualized skeletal structures are unremarkable. IMPRESSION: Questionable nodular densities projecting over the right upper lobe of indeterminate significance. It may be due to overlapping of soft tissues. Further evaluation with CT examination would be helpful. No acute cardiopulmonary process. Electronically Signed   By: Keane Police D.O.   On: 11/09/2021 10:05   DG Chest Port 1 View  Result Date: 11/08/2021 CLINICAL DATA:  Questionable sepsis. EXAM: PORTABLE CHEST 1 VIEW COMPARISON:  Chest x-ray  04/27/2014. FINDINGS: There are 2 nodular densities projecting over the lateral right upper lung which may be related to overlying lead artifact. Lungs otherwise appear clear. There is no pleural effusion or pneumothorax. Cardiomediastinal silhouette is within normal limits. No acute fractures are seen. IMPRESSION: 1. Questionable nodular densities versus overlying lead artifact in the right upper lobe. Recommend follow-up PA and lateral chest x-ray without overlying cardiac leads or follow-up nonemergent chest CT. 2. No evidence for pneumonia or edema. Electronically Signed   By: Ronney Asters M.D.   On: 11/08/2021 15:55    Scheduled Meds:  amoxicillin-clavulanate  1 tablet Oral Q12H   ARIPiprazole  10 mg Oral Daily   clonazePAM  1 mg Oral BID   enoxaparin (LOVENOX) injection  40 mg Subcutaneous Q24H   levothyroxine  125 mcg Oral Q0600   mometasone-formoterol  2 puff Inhalation BID   nicotine  14 mg Transdermal Daily   venlafaxine XR  300 mg Oral Q breakfast   Continuous Infusions:  sodium chloride 75 mL/hr at 11/09/21 1750     LOS: 1 day  Barton Dubois, MD [facility]   To contact the attending provider between 7A-7P or the covering provider during after hours 7P-7A, please log into the web site [email or web address] and access using universal [place] password for that web site. If you do not have the password, please call the hospital operator.  11/09/2021, 6:55 PM

## 2021-11-09 NOTE — TOC Progression Note (Signed)
Transition of Care [facility]) - Progression Note    Patient Details  Name: Dorothy [name] MRN: [id number] Date of Birth: [birth year]  Transition of Care [facility]) CM/SW Contact  Salome Arnt, [place] Phone Number: 11/09/2021, 11:04 AM  Clinical Narrative:    Transition of Care [facility]) Screening Note   Patient Details  Name: Dorothy [name] Date of Birth: [birth year]   Transition of Care [facility]) CM/SW Contact:    Salome Arnt, [place] Phone Number: 11/09/2021, 11:04 AM    Transition of Care Department [facility]) has reviewed patient and no TOC needs have been identified at this time. We will continue to monitor patient advancement through interdisciplinary progression rounds. If new patient transition needs arise, please place a TOC consult.        Barriers to Discharge: Continued Medical Work up  Expected Discharge Plan and Services                                                 Social Determinants of Health (SDOH) Interventions    Readmission Risk Interventions No flowsheet data found.

## 2021-11-10 LAB — URINALYSIS, ROUTINE W REFLEX MICROSCOPIC
Bilirubin Urine: NEGATIVE
Glucose, UA: NEGATIVE mg/dL
Ketones, ur: NEGATIVE mg/dL
Leukocytes,Ua: NEGATIVE
Nitrite: NEGATIVE
Protein, ur: NEGATIVE mg/dL
Specific Gravity, Urine: <1.005 — ABNORMAL LOW (ref 1.005–1.030)
pH: 6.5 (ref 5.0–8.0)

## 2021-11-10 LAB — BASIC METABOLIC PANEL
Anion gap: 7 (ref 5–15)
BUN: 16 mg/dL (ref 8–23)
CO2: 22 mmol/L (ref 22–32)
Calcium: 8 mg/dL — ABNORMAL LOW (ref 8.9–10.3)
Chloride: 104 mmol/L (ref 98–111)
Creatinine, Ser: 0.86 mg/dL (ref 0.44–1.00)
GFR, Estimated: >60 mL/min (ref >60)
Glucose, Bld: 68 mg/dL — ABNORMAL LOW (ref 70–99)
Potassium: 3.9 mmol/L (ref 3.5–5.1)
Sodium: 133 mmol/L — ABNORMAL LOW (ref 135–145)

## 2021-11-10 LAB — CBC
HCT: 40 % (ref 36.0–46.0)
Hemoglobin: 13.8 g/dL (ref 12.0–15.0)
MCH: 27.3 pg (ref 26.0–34.0)
MCHC: 34.5 g/dL (ref 30.0–36.0)
MCV: 79.2 fL — ABNORMAL LOW (ref 80.0–100.0)
Platelets: 186 10*3/uL (ref 150–400)
RBC: 5.05 MIL/uL (ref 3.87–5.11)
RDW: 14.6 % (ref 11.5–15.5)
WBC: 10.6 10*3/uL — ABNORMAL HIGH (ref 4.0–10.5)
nRBC: 0 % (ref 0.0–0.2)

## 2021-11-10 LAB — URINALYSIS, MICROSCOPIC (REFLEX)
Bacteria, UA: NONE SEEN
Squamous Epithelial / HPF: NONE SEEN (ref 0–5)
WBC, UA: NONE SEEN WBC/hpf (ref 0–5)

## 2021-11-10 MED ORDER — AMOXICILLIN-POT CLAVULANATE 875-125 MG PO TABS: 1.0000 | ORAL_TABLET | Freq: Two times a day (BID) | ORAL | 0 refills | Status: AC

## 2021-11-10 MED ORDER — NICOTINE 14 MG/24HR TD PT24: 14.0000 mg | MEDICATED_PATCH | Freq: Every day | TRANSDERMAL | 0 refills | Status: DC

## 2021-11-10 MED ORDER — HYDROCHLOROTHIAZIDE 25 MG PO TABS: 12.5000 mg | ORAL_TABLET | Freq: Every day | ORAL | Status: DC

## 2021-11-10 MED ORDER — MOMETASONE FURO-FORMOTEROL FUM 200-5 MCG/ACT IN AERO: 2.0000 | INHALATION_SPRAY | Freq: Two times a day (BID) | RESPIRATORY_TRACT | 5 refills | Status: DC

## 2021-11-10 MED ORDER — DM-GUAIFENESIN ER 30-600 MG PO TB12: 1.0000 | ORAL_TABLET | Freq: Two times a day (BID) | ORAL | 0 refills | Status: DC

## 2021-11-10 NOTE — Discharge Summary (Signed)
Physician Discharge Summary  Dorothy [name] IRC:[id number] DOB: 1956/02/10 DOA: 11/08/2021  PCP: Renee Rival, NP  Admit date: 11/08/2021 Discharge date: 11/10/2021  Time spent: 35 minutes  Recommendations for Outpatient Follow-up:  Repeat chest x-ray in 6-8 weeks to assure complete resolution of infiltrates. Arrange Outpatient follow-up with pulmonologist for PFTs and further evaluation/management of her COPD maintenance therapy. Repeat basic metabolic panel to follow to lites and renal function. Reassess blood pressure with further adjustment to antihypertensive treatment as required. Continue assisting patient with tobacco cessation.   Discharge Diagnoses:  Community-acquired pneumonia Hyponatremia Hypothyroidism (acquired) Benign essential HTN Depression with anxiety Hypertension/hypotension History of COPD/ongoing tobacco abuse   Discharge Condition: Stable and improved.  Discharged home with instruction to follow-up with PCP in 10 days.  CODE STATUS: Full code.  Diet recommendation: Heart healthy diet.  Filed Weights   11/08/21 1842  Weight: 63.5 kg    History of present illness:  As per H&P written by Dr. Denton Brick on 11/08/2021 Dorothy [name] is a 66 y.o. female with medical history significant for hypertension, COPD, hypothyroidism, depression, avascular necrosis. Patient presented to the ED with complaints of generalized weakness over the past week.  She denies dizziness.  No chest pain.  No difficulty breathing.  She is a smoker, she has chronic cough, but about a week ago she had worsening of her cough, but this is resolving now.  She reports good oral intake, no vomiting no loose stools, no abdominal pain, no pain with urination..  She reports she drinks a lot of diet [facility], more than 4 to 5 cans a day.   Patient went to see her primary care provider, she was referred to the ED due to low blood pressure.   ED Course: Temperature 97.5.  Heart rate  72-83.  Blood pressure down to 78/60, improved after 1 L bolus.  Respiratory rate 15-20.  WBC 26.  Sodium 120.  Lactic acid 1.3 > 1.5.  UA pending. Blood cultures obtained. IV Vanco cefepime and metronidazole started.  Blood cultures ordered.  Hospitalist to admit for hyponatremia, SIRS.  Hospital Course:  1-hyponatremia -Improved/resolved with IV fluids resuscitation and proper hydration.   -Continue to follow electrolytes trend with repeat basic metabolic panel at follow-up visit. -Patient reeducated about the importance of maintaining regular hydration and minimizing the use of soft drinks. -Okay to resume HCTZ at half the dose currently.   2-hypertension -In the setting of dehydration and continued use of antihypertensive agents/diuretics. -Normal cortisol -Safe to resume home antihypertensive regimen and adjust the dosages. -Advised to follow heart healthy diet. -Maintain adequate hydration -Reassess blood pressure and further adjust management during follow-up visit.    3-history of COPD/ongoing tobacco abuse -Cessation counseling provided -Continue nicotine patch -Continue bronchodilator -Patient has been started on Dulera and advised to be compliant with medication. -Patient has just finish steroids tapering as part of acute COPD management as an outpatient. -Continue antibiotics for bronchiectasis/pneumonia as mentioned below. -Repeat chest x-ray in 6-8 weeks.   4-bronchiectasis/right lower lobe pneumonia -Will continue treatment with Augmentin -Continue bronchodilators and initiation of Dulera as mentioned above. -Patient advised to continue the use of flutter valve. -Advised to stop smoking -Will recommend outpatient follow-up with pulmonologist.   5-depression/anxiety -Continue Effexor, Abilify and as needed clonazepam. -Overall mood stable. -Continue outpatient follow-up with psychiatry service.   6-hypothyroidism -Continue Synthroid. -Continue to follow thyroid  panel as an outpatient.    Procedures: See below for x-ray reports.  Consultations: None  Discharge Exam: Vitals:   11/10/21 0443 11/10/21 0738  BP: 131/73   Pulse: 60   Resp:    Temp: 97.9 F (36.6 C)   SpO2: 90% 92%    General: Afebrile, hemodynamically stable and in no acute distress.  Feeling ready to go home.  No requiring oxygen supplementation and with negative orthostatics. Cardiovascular: S1 and S2, no rubs, no gallops, no JVD. Respiratory: Positive scattered rhonchi, mild expiratory wheezing; no using accessory muscle.  Good saturation on room air Abdomen: Soft, nontender, nondistended, positive bowel sounds. Extremities: No cyanosis or clubbing.  Discharge Instructions   Discharge Instructions     Diet - low sodium heart healthy   Complete by: As directed    Discharge instructions   Complete by: As directed    Arrange follow-up with PCP in 10 days Stop smoking Maintain adequate hydration Follow heart healthy diet Be compliant with medications, especially inhaler management   Increase activity slowly   Complete by: As directed       Allergies as of 11/10/2021       Reactions   Sulfa Antibiotics Rash        Medication List     STOP taking these medications    Advair Diskus 250-50 MCG/ACT Aepb Generic drug: fluticasone-salmeterol   ibuprofen 800 MG tablet Commonly known as: ADVIL   levofloxacin 500 MG tablet Commonly known as: LEVAQUIN   predniSONE 20 MG tablet Commonly known as: DELTASONE       TAKE these medications    albuterol 108 (90 Base) MCG/ACT inhaler Commonly known as: VENTOLIN HFA Inhale into the lungs.   amoxicillin-clavulanate 875-125 MG tablet Commonly known as: AUGMENTIN Take 1 tablet by mouth every 12 (twelve) hours for 5 days.   ARIPiprazole 10 MG tablet Commonly known as: ABILIFY Take 10 mg by mouth daily. Takes 1/2 daily   aspirin EC 81 MG tablet Take 81 mg by mouth daily. Swallow whole.   CALCIUM  PO Take 1 tablet by mouth daily.   clonazePAM 0.5 MG tablet Commonly known as: KLONOPIN Take 1 mg by mouth 2 (two) times daily.   dextromethorphan-guaiFENesin 30-600 MG 12hr tablet Commonly known as: MUCINEX DM Take 1 tablet by mouth 2 (two) times daily.   ELDERBERRY PO Take by mouth.   hydrochlorothiazide 25 MG tablet Commonly known as: HYDRODIURIL Take 0.5 tablets (12.5 mg total) by mouth daily. Start taking on: November 12, 2021 What changed:  how much to take These instructions start on November 12, 2021. If you are unsure what to do until then, ask your doctor or other care provider.   levothyroxine 125 MCG tablet Commonly known as: SYNTHROID Take 125 mcg by mouth daily.   lisinopril 20 MG tablet Commonly known as: ZESTRIL Take 20 mg by mouth daily.   mometasone-formoterol 200-5 MCG/ACT Aero Commonly known as: DULERA Inhale 2 puffs into the lungs 2 (two) times daily.   multivitamins ther. w/minerals Tabs tablet Take 1 tablet by mouth daily.   nicotine 14 mg/24hr patch Commonly known as: NICODERM CQ - dosed in mg/24 hours Place 1 patch (14 mg total) onto the skin daily. Start taking on: November 11, 2021   venlafaxine XR 150 MG 24 hr capsule Commonly known as: EFFEXOR-XR Take 300 mg by mouth daily with breakfast.   VITAMIN D PO Take 2,000 Units by mouth daily.   zolpidem 10 MG tablet Commonly known as: AMBIEN Take 10 mg by mouth at bedtime as needed for sleep.  Allergies  Allergen Reactions   Sulfa Antibiotics Rash    Follow-up Information     Renee Rival, NP. Schedule an appointment as soon as possible for a visit in 10 day(s).   Specialty: Nurse Practitioner Contact information: [address] [place] [address] [phone]                  The results of significant diagnostics from this hospitalization (including imaging, microbiology, ancillary and laboratory) are listed below for reference.    Significant  Diagnostic Studies: DG Chest 2 View  Result Date: 11/09/2021 CLINICAL DATA:  Generalized weakness over the past week. EXAM: CHEST - 2 VIEW COMPARISON:  Chest radiograph dated November 08, 2021. FINDINGS: The heart size and mediastinal contours are within normal limits. Questionable nodular densities projecting over the right upper lobe. The visualized skeletal structures are unremarkable. IMPRESSION: Questionable nodular densities projecting over the right upper lobe of indeterminate significance. It may be due to overlapping of soft tissues. Further evaluation with CT examination would be helpful. No acute cardiopulmonary process. Electronically Signed   By: Keane Police D.O.   On: 11/09/2021 10:05   DG Chest Port 1 View  Result Date: 11/08/2021 CLINICAL DATA:  Questionable sepsis. EXAM: PORTABLE CHEST 1 VIEW COMPARISON:  Chest x-ray 04/27/2014. FINDINGS: There are 2 nodular densities projecting over the lateral right upper lung which may be related to overlying lead artifact. Lungs otherwise appear clear. There is no pleural effusion or pneumothorax. Cardiomediastinal silhouette is within normal limits. No acute fractures are seen. IMPRESSION: 1. Questionable nodular densities versus overlying lead artifact in the right upper lobe. Recommend follow-up PA and lateral chest x-ray without overlying cardiac leads or follow-up nonemergent chest CT. 2. No evidence for pneumonia or edema. Electronically Signed   By: Ronney Asters M.D.   On: 11/08/2021 15:55    Microbiology: Recent Results (from the past 240 hour(s))  Resp Panel by RT-PCR (Flu A&B, Covid) Nasopharyngeal Swab     Status: None   Collection Time: 11/08/21  4:04 PM   Specimen: Nasopharyngeal Swab; Nasopharyngeal(NP) swabs in vial transport medium  Result Value Ref Range Status   SARS Coronavirus 2 by RT PCR NEGATIVE NEGATIVE Final    Comment: (NOTE) SARS-CoV-2 target nucleic acids are NOT DETECTED.  The SARS-CoV-2 RNA is generally detectable  in upper respiratory specimens during the acute phase of infection. The lowest concentration of SARS-CoV-2 viral copies this assay can detect is 138 copies/mL. A negative result does not preclude SARS-Cov-2 infection and should not be used as the sole basis for treatment or other patient management decisions. A negative result may occur with  improper specimen collection/handling, submission of specimen other than nasopharyngeal swab, presence of viral mutation(s) within the areas targeted by this assay, and inadequate number of viral copies(<138 copies/mL). A negative result must be combined with clinical observations, patient history, and epidemiological information. The expected result is Negative.  Fact Sheet for Patients:  EntrepreneurPulse.com.au  Fact Sheet for Healthcare Providers:  IncredibleEmployment.be  This test is no t yet approved or cleared by the [place] FDA and  has been authorized for detection and/or diagnosis of SARS-CoV-2 by FDA under an Emergency Use Authorization (EUA). This EUA will remain  in effect (meaning this test can be used) for the duration of the COVID-19 declaration under Section 564(b)(1) of the Act, 21 U.S.C.section 360bbb-3(b)(1), unless the authorization is terminated  or revoked sooner.       Influenza A by PCR NEGATIVE NEGATIVE  Final   Influenza B by PCR NEGATIVE NEGATIVE Final    Comment: (NOTE) The Xpert Xpress SARS-CoV-2/FLU/RSV plus assay is intended as an aid in the diagnosis of influenza from Nasopharyngeal swab specimens and should not be used as a sole basis for treatment. Nasal washings and aspirates are unacceptable for Xpert Xpress SARS-CoV-2/FLU/RSV testing.  Fact Sheet for Patients: EntrepreneurPulse.com.au  Fact Sheet for Healthcare Providers: IncredibleEmployment.be  This test is not yet approved or cleared by the [place] FDA and has  been authorized for detection and/or diagnosis of SARS-CoV-2 by FDA under an Emergency Use Authorization (EUA). This EUA will remain in effect (meaning this test can be used) for the duration of the COVID-19 declaration under Section 564(b)(1) of the Act, 21 U.S.C. section 360bbb-3(b)(1), unless the authorization is terminated or revoked.  Performed at [facility], [address] [place], [place] [address]   Blood Culture (routine x 2)     Status: None (Preliminary result)   Collection Time: 11/08/21  4:04 PM   Specimen: BLOOD LEFT FOREARM  Result Value Ref Range Status   Specimen Description BLOOD LEFT FOREARM  Final   Special Requests   Final    BOTTLES DRAWN AEROBIC AND ANAEROBIC Blood Culture adequate volume   Culture   Final    NO GROWTH 2 DAYS Performed at [facility], [address], [place], [place] [address]    Report Status PENDING  Incomplete  Blood Culture (routine x 2)     Status: None (Preliminary result)   Collection Time: 11/08/21  4:04 PM   Specimen: Right Antecubital; Blood  Result Value Ref Range Status   Specimen Description RIGHT ANTECUBITAL  Final   Special Requests   Final    BOTTLES DRAWN AEROBIC AND ANAEROBIC Blood Culture adequate volume   Culture   Final    NO GROWTH 2 DAYS Performed at [facility], [address], [place], [place] [address]    Report Status PENDING  Incomplete     Labs: Basic Metabolic Panel: Recent Labs  Lab 11/08/21 1522 11/09/21 0628 11/10/21 0557  NA 120* 131* 133*  K 4.2 3.9 3.9  CL 84* 98 104  CO2 24 24 22   GLUCOSE 110* 99 68*  BUN 24* 22 16  CREATININE 1.31* 0.90 0.86  CALCIUM 8.9 8.5* 8.0*  MG 2.2  --   --    Liver Function Tests: Recent Labs  Lab 11/08/21 1522  AST 15  ALT 21  ALKPHOS 77  BILITOT 0.5  PROT 7.6  ALBUMIN 3.9   CBC: Recent Labs  Lab 11/08/21 1522 11/09/21 0628 11/10/21 0557  WBC 26.9* 18.0* 10.6*  NEUTROABS 24.0*  --   --   HGB 17.2* 13.8 13.8  HCT 47.0* 39.3 40.0  MCV 78.7*  77.7* 79.2*  PLT 264 206 186    Signed:  Barton Dubois MD.  [facility] 11/10/2021, 9:56 AM

## 2021-11-11 DIAGNOSIS — F4323 Adjustment disorder with mixed anxiety and depressed mood: Secondary | ICD-10-CM | POA: Insufficient documentation

## 2021-11-11 DIAGNOSIS — H348111 Central retinal vein occlusion, right eye, with retinal neovascularization: Secondary | ICD-10-CM | POA: Insufficient documentation

## 2021-11-11 DIAGNOSIS — E063 Autoimmune thyroiditis: Secondary | ICD-10-CM | POA: Insufficient documentation

## 2021-11-11 DIAGNOSIS — F17210 Nicotine dependence, cigarettes, uncomplicated: Secondary | ICD-10-CM | POA: Insufficient documentation

## 2021-11-11 DIAGNOSIS — H4089 Other specified glaucoma: Secondary | ICD-10-CM | POA: Insufficient documentation

## 2021-11-12 LAB — URINE CULTURE: Culture: NO GROWTH

## 2021-11-13 LAB — CULTURE, BLOOD (ROUTINE X 2)
Culture: NO GROWTH
Culture: NO GROWTH
Special Requests: ADEQUATE
Special Requests: ADEQUATE

## 2021-11-14 ENCOUNTER — Ambulatory Visit: Payer: Medicare HMO | Admitting: Obstetrics and Gynecology

## 2021-12-06 ENCOUNTER — Ambulatory Visit (HOSPITAL_COMMUNITY)
Admission: RE | Admit: 2021-12-06 | Discharge: 2021-12-06 | Disposition: A | Discharge status: Home / Self Care | Payer: Medicare HMO | Source: Ambulatory Visit | Attending: Nurse Practitioner | Admitting: Nurse Practitioner

## 2021-12-06 ENCOUNTER — Other Ambulatory Visit: Payer: Self-pay

## 2021-12-06 DIAGNOSIS — F17210 Nicotine dependence, cigarettes, uncomplicated: Secondary | ICD-10-CM | POA: Insufficient documentation

## 2021-12-06 DIAGNOSIS — Z122 Encounter for screening for malignant neoplasm of respiratory organs: Secondary | ICD-10-CM | POA: Insufficient documentation

## 2021-12-06 DIAGNOSIS — J439 Emphysema, unspecified: Secondary | ICD-10-CM | POA: Diagnosis not present

## 2021-12-06 DIAGNOSIS — I700 Atherosclerosis of aorta: Secondary | ICD-10-CM | POA: Insufficient documentation

## 2021-12-06 DIAGNOSIS — R0989 Other specified symptoms and signs involving the circulatory and respiratory systems: Secondary | ICD-10-CM | POA: Insufficient documentation

## 2021-12-06 DIAGNOSIS — I6523 Occlusion and stenosis of bilateral carotid arteries: Secondary | ICD-10-CM | POA: Diagnosis not present

## 2021-12-06 DIAGNOSIS — Z720 Tobacco use: Secondary | ICD-10-CM

## 2022-01-04 ENCOUNTER — Encounter: Payer: Self-pay | Admitting: Vascular Surgery

## 2022-01-04 ENCOUNTER — Other Ambulatory Visit: Payer: Self-pay

## 2022-01-04 ENCOUNTER — Ambulatory Visit: Payer: Medicare HMO | Admitting: Vascular Surgery

## 2022-01-04 VITALS — BP 111/75 | HR 80 | Ht 65.75 in | Wt 144.0 lb

## 2022-01-04 DIAGNOSIS — I6523 Occlusion and stenosis of bilateral carotid arteries: Secondary | ICD-10-CM | POA: Diagnosis not present

## 2022-01-04 NOTE — Progress Notes (Signed)
? ? ?[facility] ? ?Patient name: Dorothy [name] MRN: [id number] DOB: 1956-08-04 Sex: female ? ?REASON FOR CONSULT: Evaluation carotid disease ? ?HPI: ?Dorothy [name] is a 66 y.o. female, who is here today for evaluation of carotid disease.  She is here with her friend and caregiver who helps give history.  Her friend is concerned that she has become much more forgetful and not able to take care of her affairs.  She apparently was found to have carotid bruit and that is what initiated this carotid duplex finding.  She does have a history of prior peripheral vascular disease.  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 Oneida Alar 10 years ago.  She has done extremely well from this and has had no recurrent symptoms.  She specifically denies any focal events of amaurosis fugax, aphasia, TIA or stroke.  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  I do not have these specific evaluation but there is no mention of branch retinal artery or retinal artery occlusion ? ?Past Medical History:  ?Diagnosis Date  ? Anxiety   ? Arthritis   ? hands and Knees  ? Avascular necrosis of bones of both hips ([facility] [place]) 01/17/2012  ? Benign essential HTN 01/17/2012  ? Cancer [facility]) 1982  ? ovarian  ? Cancer of cervix ([place])   ? Constipation   ? Depression   ? Takes Klonopin  ? Depression with anxiety 01/17/2012  ? GERD (gastroesophageal reflux disease)   ? Hypercholesteremia   ? Hypertension   ? takes meds  ? Hypothyroidism   ? on Levonthyroxine  ? Hypothyroidism (acquired) 01/17/2012  ? Since age 67  ? Osteopenia 2018  ? forearm  ? Osteoporosis 07/25/2021  ? forearm  ? Ovarian cyst 01/17/2012  ? 1995  BSO  ? Personal history of colonic polyps 11/07/2007  ? Pneumonia   ? x 2- last time > 5 years ago  ? Pneumonia 01/17/2012  ? 10 yrs ago and 5 years ago   ? Rash and other nonspecific skin  eruption 01/17/2012  ? Dorsum hands & extensor surfaces arms; intermittent  ? ? ?Family History  ?Problem Relation Age of Onset  ? Breast cancer Sister 11  ?     dec age 60  ? Cancer Sister   ? Cancer Mother   ?     lung  ? Heart failure Father   ? Hypertension Son   ? Ovarian cancer Paternal Grandmother   ?     dec age 31  ? Heart attack Brother 15  ? Anesthesia problems Neg Hx   ? Hypotension Neg Hx   ? Malignant hyperthermia Neg Hx   ? Pseudochol deficiency Neg Hx   ? Colon cancer Neg Hx   ? Esophageal cancer Neg Hx   ? Rectal cancer Neg Hx   ? Stomach cancer Neg Hx   ? ? ?SOCIAL HISTORY: ?Social History  ? ?Socioeconomic History  ? Marital status: Widowed  ?  Spouse name: Not on file  ? Number of children: 1  ? Years of education: Not on file  ? Highest education level: Not on file  ?Occupational History  ? Occupation: disabled  ?Tobacco Use  ? Smoking status: Former  ?  Packs/day: 1.00  ?  Years: 40.00  ?  Pack years: 40.00  ?  Types: Cigarettes  ?  Quit date: 10/2021  ?  Years since quitting: 0.2  ? Smokeless tobacco: Never  ? Tobacco comments:  ?  pt states that she wants to quit but needs help was advised to call 1-800-QUITNOW and to speak with PCP about avaliable aids  ?Vaping Use  ? Vaping Use: Never used  ?Substance and Sexual Activity  ? Alcohol use: No  ?  Alcohol/week: 0.0 standard drinks  ? Drug use: No  ? Sexual activity: Yes  ?  Partners: Male  ?  Birth control/protection: Surgical  ?  Comment: TVH/ovaries retained  ?Other Topics Concern  ? Not on file  ?Social History Narrative  ? Not on file  ? ?Social Determinants of Health  ? ?Financial Resource Strain: Not on file  ?Food Insecurity: Not on file  ?Transportation Needs: Not on file  ?Physical Activity: Not on file  ?Stress: Not on file  ?Social Connections: Not on file  ?Intimate Partner Violence: Not on file  ? ? ?Allergies  ?Allergen Reactions  ? Sulfa Antibiotics Rash  ? ? ?Current Outpatient Medications  ?Medication Sig Dispense Refill  ?  ADVAIR DISKUS 250-50 MCG/ACT AEPB Inhale 1 puff into the lungs in the morning and at bedtime.    ? ARIPiprazole (ABILIFY) 10 MG tablet Take 10 mg by mouth daily. Takes 1/2 daily    ? aspirin EC 81 MG tablet Take 81 mg by mouth daily. Swallow whole.    ? brimonidine (ALPHAGAN) 0.2 % ophthalmic solution Place 1 drop into the right eye 3 (three) times daily.    ? CALCIUM PO Take 1 tablet by mouth daily.    ? clonazePAM (KLONOPIN) 0.5 MG tablet Take 1 mg by mouth 2 (two) times daily.    ? dorzolamide-timolol (COSOPT) 22.3-6.8 MG/ML ophthalmic solution Place 1 drop into the right eye 2 (two) times daily.    ? ELDERBERRY PO Take by mouth.    ? hydrochlorothiazide (HYDRODIURIL) 25 MG tablet Take 0.5 tablets (12.5 mg total) by mouth daily.    ? levothyroxine (SYNTHROID) 125 MCG tablet Take 125 mcg by mouth daily.    ? lisinopril (ZESTRIL) 20 MG tablet Take 20 mg by mouth daily.    ? Multiple Vitamins-Minerals (MULTIVITAMINS THER. W/MINERALS) TABS tablet Take 1 tablet by mouth daily.    ? nicotine (NICODERM CQ - DOSED IN MG/24 HOURS) 14 mg/24hr patch Place 1 patch (14 mg total) onto the skin daily. 28 patch 0  ? venlafaxine (EFFEXOR-XR) 150 MG 24 hr capsule Take 300 mg by mouth daily with breakfast.    ? VITAMIN D PO Take 2,000 Units by mouth daily.    ? zolpidem (AMBIEN) 10 MG tablet Take 10 mg by mouth at bedtime as needed for sleep.    ? dextromethorphan-guaiFENesin (MUCINEX DM) 30-600 MG 12hr tablet Take 1 tablet by mouth 2 (two) times daily. (Patient not taking: Reported on 01/04/2022) 20 tablet 0  ? ?No current facility-administered medications for this visit.  ? ? ?REVIEW OF SYSTEMS:  ?'[X]'$  denotes positive finding, '[ ]'$  denotes negative finding ?Cardiac  Comments:  ?Chest pain or chest pressure:    ?Shortness of breath upon exertion:    ?Short of breath when lying flat:    ?Irregular heart rhythm:    ?    ?Vascular    ?Pain in calf, thigh, or hip brought on by ambulation:    ?Pain in feet at night that wakes you up from  your sleep:     ?Blood clot in your veins:    ?Leg swelling:     ?    ?  Pulmonary    ?Oxygen at home:    ?Productive cough:     ?Wheezing:     ?    ?Neurologic    ?Sudden weakness in arms or legs:     ?Sudden numbness in arms or legs:     ?Sudden onset of difficulty speaking or slurred speech:    ?Temporary loss of vision in one eye:     ?Problems with dizziness:     ?    ?Gastrointestinal    ?Blood in stool:     ?Vomited blood:     ?    ?Genitourinary    ?Burning when urinating:     ?Blood in urine:    ?    ?Psychiatric    ?Major depression:     ?    ?Hematologic    ?Bleeding problems:    ?Problems with blood clotting too easily:    ?    ?Skin    ?Rashes or ulcers:    ?    ?Constitutional    ?Fever or chills:    ? ? ?PHYSICAL EXAM: ?Vitals:  ? 01/04/22 1409  ?BP: 111/75  ?Pulse: 80  ?Weight: 144 lb (65.3 kg)  ?Height: 5' 5.75" (1.67 m)  ? ? ?GENERAL: The patient is a well-nourished female, in no acute distress. The vital signs are documented above. ?CARDIOVASCULAR: I do not appreciate carotid bruits bilaterally.  She has 2+ radial pulses bilaterally. ?PULMONARY: There is good air exchange  ?MUSCULOSKELETAL: There are no major deformities or cyanosis. ?NEUROLOGIC: No focal weakness or paresthesias are detected. ?SKIN: There are no ulcers or rashes noted. ?PSYCHIATRIC: The patient has a normal affect. ? 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  Her i end diastolic velocities would suggest the lower edge of this range ? ?MEDICAL ISSUES: ?Had long discussion with the patient and her friend regarding these findings.  I explained there is no treatment for her occluded internal carotid artery and that she has adequate [place] and has been asymptomatic.  I explained that it would be very unlikely that her carotid disease have any bearing on her forgetfulness and  decreased ability to take care of her own affairs.  I have recommended CT angiogram for further evaluation.  I explained that since she does have significant stenosis in her left internal carotid artery with a c

## 2022-01-11 ENCOUNTER — Other Ambulatory Visit: Payer: Self-pay

## 2022-01-11 DIAGNOSIS — I6523 Occlusion and stenosis of bilateral carotid arteries: Secondary | ICD-10-CM

## 2022-01-24 ENCOUNTER — Ambulatory Visit
Admission: RE | Admit: 2022-01-24 | Discharge: 2022-01-24 | Disposition: A | Discharge status: Home / Self Care | Payer: Medicare HMO | Source: Ambulatory Visit | Attending: Vascular Surgery | Admitting: Vascular Surgery

## 2022-01-24 DIAGNOSIS — I6523 Occlusion and stenosis of bilateral carotid arteries: Secondary | ICD-10-CM

## 2022-01-24 MED ORDER — IOPAMIDOL (ISOVUE-370) INJECTION 76%
75.0000 mL | Freq: Once | INTRAVENOUS | Status: AC | PRN
  Administered 2022-01-24: 75 mL via INTRAVENOUS

## 2022-02-01 ENCOUNTER — Encounter: Payer: Self-pay | Admitting: Vascular Surgery

## 2022-02-01 ENCOUNTER — Ambulatory Visit: Payer: Medicare HMO | Admitting: Vascular Surgery

## 2022-02-01 VITALS — BP 122/79 | HR 68 | Temp 97.5°F | Resp 16 | Ht 66.0 in | Wt 152.6 lb

## 2022-02-01 DIAGNOSIS — I6523 Occlusion and stenosis of bilateral carotid arteries: Secondary | ICD-10-CM | POA: Diagnosis not present

## 2022-02-01 NOTE — Progress Notes (Signed)
? ? [facility] [place] ? ?Patient name: Dorothy [name] MRN: [id number] DOB: 1956-01-20 Sex: female ? ?REASON FOR VISIT: Follow-up CT scan of head and neck ? ?HPI: ?Dorothy [name] is a 66 y.o. female here today for follow-up.  I initially seen her 2 weeks ago for office visit.  She is here today with her daughter-in-law.  The daughter-in-law confirms that she has been having some episodes of confusion and mental status changes making it difficult to care for her affairs.  No focal deficits. ? ?Past Medical History:  ?Diagnosis Date  ? Anxiety   ? Arthritis   ? hands and Knees  ? Avascular necrosis of bones of both hips ([place]) 01/17/2012  ? Benign essential HTN 01/17/2012  ? Cancer [facility]) 1982  ? ovarian  ? Cancer of cervix ([place])   ? Constipation   ? Depression   ? Takes Klonopin  ? Depression with anxiety 01/17/2012  ? GERD (gastroesophageal reflux disease)   ? Hypercholesteremia   ? Hypertension   ? takes meds  ? Hypothyroidism   ? on Levonthyroxine  ? Hypothyroidism (acquired) 01/17/2012  ? Since age 15  ? Osteopenia 2018  ? forearm  ? Osteoporosis 07/25/2021  ? forearm  ? Ovarian cyst 01/17/2012  ? 1995  BSO  ? Personal history of colonic polyps 11/07/2007  ? Pneumonia   ? x 2- last time > 5 years ago  ? Pneumonia 01/17/2012  ? 10 yrs ago and 5 years ago   ? Rash and other nonspecific skin eruption 01/17/2012  ? Dorsum hands & extensor surfaces arms; intermittent  ? ? ?Family History  ?Problem Relation Age of Onset  ? Breast cancer Sister 91  ?     dec age 29  ? Cancer Sister   ? Cancer Mother   ?     lung  ? Heart failure Father   ? Hypertension Son   ? Ovarian cancer Paternal Grandmother   ?     dec age 49  ? Heart attack Brother 30  ? Anesthesia problems Neg Hx   ? Hypotension Neg Hx   ? Malignant hyperthermia Neg Hx   ? Pseudochol deficiency Neg Hx   ? Colon cancer Neg Hx   ? Esophageal cancer Neg Hx   ? Rectal cancer Neg Hx   ? Stomach cancer Neg Hx    ? ? ?SOCIAL HISTORY: ?Social History  ? ?Tobacco Use  ? Smoking status: Former  ?  Packs/day: 1.00  ?  Years: 40.00  ?  Pack years: 40.00  ?  Types: Cigarettes  ?  Quit date: 10/2021  ?  Years since quitting: 0.2  ? Smokeless tobacco: Never  ? Tobacco comments:  ?  pt states that she wants to quit but needs help was advised to call 1-800-QUITNOW and to speak with PCP about avaliable aids  ?Substance Use Topics  ? Alcohol use: No  ?  Alcohol/week: 0.0 standard drinks  ? ? ?Allergies  ?Allergen Reactions  ? Sulfa Antibiotics Rash  ? ? ?Current Outpatient Medications  ?Medication Sig Dispense Refill  ? ADVAIR DISKUS 250-50 MCG/ACT AEPB Inhale 1 puff into the lungs in the morning and at bedtime.    ? ARIPiprazole (ABILIFY) 10 MG tablet Take 10 mg by mouth daily. Takes 1/2 daily    ? aspirin EC 81 MG tablet Take 81 mg by mouth daily. Swallow whole.    ? brimonidine (ALPHAGAN) 0.2 % ophthalmic solution Place 1  drop into the right eye 3 (three) times daily.    ? CALCIUM PO Take 1 tablet by mouth daily.    ? clonazePAM (KLONOPIN) 0.5 MG tablet Take 1 mg by mouth 2 (two) times daily.    ? dextromethorphan-guaiFENesin (MUCINEX DM) 30-600 MG 12hr tablet Take 1 tablet by mouth 2 (two) times daily. (Patient not taking: Reported on 01/04/2022) 20 tablet 0  ? dorzolamide-timolol (COSOPT) 22.3-6.8 MG/ML ophthalmic solution Place 1 drop into the right eye 2 (two) times daily.    ? ELDERBERRY PO Take by mouth.    ? hydrochlorothiazide (HYDRODIURIL) 25 MG tablet Take 0.5 tablets (12.5 mg total) by mouth daily.    ? levothyroxine (SYNTHROID) 125 MCG tablet Take 125 mcg by mouth daily.    ? lisinopril (ZESTRIL) 20 MG tablet Take 20 mg by mouth daily.    ? Multiple Vitamins-Minerals (MULTIVITAMINS THER. W/MINERALS) TABS tablet Take 1 tablet by mouth daily.    ? nicotine (NICODERM CQ - DOSED IN MG/24 HOURS) 14 mg/24hr patch Place 1 patch (14 mg total) onto the skin daily. 28 patch 0  ? venlafaxine (EFFEXOR-XR) 150 MG 24 hr capsule Take  300 mg by mouth daily with breakfast.    ? VITAMIN D PO Take 2,000 Units by mouth daily.    ? zolpidem (AMBIEN) 10 MG tablet Take 10 mg by mouth at bedtime as needed for sleep.    ? ?No current facility-administered medications for this visit.  ? ? ?REVIEW OF SYSTEMS:  ?'[X]'$  denotes positive finding, '[ ]'$  denotes negative finding ?Cardiac  Comments:  ?Chest pain or chest pressure:    ?Shortness of breath upon exertion:    ?Short of breath when lying flat:    ?Irregular heart rhythm:    ?    ?Vascular    ?Pain in calf, thigh, or hip brought on by ambulation:    ?Pain in feet at night that wakes you up from your sleep:     ?Blood clot in your veins:    ?Leg swelling:     ?    ? ? ?PHYSICAL EXAM: ?Vitals:  ? 02/01/22 1301 02/01/22 1302  ?BP: 123/77 122/79  ?Pulse: 68   ?Resp: 16   ?Temp: (!) 97.5 ?F (36.4 ?C)   ?TempSrc: Temporal   ?SpO2: 99%   ?Weight: 152 lb 9.6 oz (69.2 kg)   ?Height: '5\' 6"'$  (1.676 m)   ? ? ?DATA:  ?I reviewed the CT scan with the patient and her daughter-in-law.  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  Her left internal carotid artery is significantly less narrowed than was predicted by her duplex.  She is moderate 50 to 60% stenosis in the left internal carotid artery.  Both vertebral arteries are patent ? 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 ?MEDICAL ISSUES: ?I discussed these findings at length with patient and her daughter-in-law.  I explained that there is no indication for left carotid surgery with her degree of asymptomatic stenosis.  I have discussed with them before and reiterated that there is no surgical treatment for her occluded carotid artery on the right.  Also explained that this does not account for her mental status changes are or her old right occipital infarct.  I would suggest consideration of neurology referral for evaluation of her mental  status changes and intracranial disease.  We  will see her again with repeat carotid duplex in 6 months ? ? ? ?Rosetta Posner, MD FACS ?[facility] [place] ?Office Tel [phone] ? ?Note: Portions of this report may have been transcribed using voice recognition software.  Every effort has been made to ensure accuracy; however, inadvertent computerized transcription errors may still be present. ?

## 2022-02-06 ENCOUNTER — Other Ambulatory Visit: Payer: Self-pay | Admitting: *Deleted

## 2022-02-06 DIAGNOSIS — I6523 Occlusion and stenosis of bilateral carotid arteries: Secondary | ICD-10-CM

## 2022-07-14 ENCOUNTER — Other Ambulatory Visit: Payer: Self-pay | Admitting: Nurse Practitioner

## 2022-07-14 DIAGNOSIS — Z1231 Encounter for screening mammogram for malignant neoplasm of breast: Secondary | ICD-10-CM

## 2022-08-02 ENCOUNTER — Encounter: Payer: Self-pay | Admitting: Vascular Surgery

## 2022-08-02 ENCOUNTER — Ambulatory Visit (INDEPENDENT_AMBULATORY_CARE_PROVIDER_SITE_OTHER): Payer: Self-pay | Admitting: Vascular Surgery

## 2022-08-02 VITALS — BP 146/88 | HR 60 | Temp 97.2°F | Ht 66.0 in | Wt 142.0 lb

## 2022-08-02 DIAGNOSIS — I6523 Occlusion and stenosis of bilateral carotid arteries: Secondary | ICD-10-CM

## 2022-08-02 NOTE — Progress Notes (Signed)
[facility] [place]  Patient name: Dorothy [name] MRN: [id number] DOB: May 21, 1956 Sex: female  REASON FOR VISIT: Follow-up carotid disease  HPI: Dorothy [name] is a 66 y.o. female here today for follow-up.  I saw her 6 months ago for evaluation of carotid disease.  At that time she had a duplex suggesting right common and internal carotid occlusion and severe left internal carotid stenosis.  She then underwent CT scan confirming right carotid occlusion but suggested approximately 60% left internal carotid artery stenosis.  She is not having left brain events and therefore I recommended observation only.  She was to see me today with a 61-monthfollow-up and a carotid duplex.  She reports no new neurologic deficits.  She looks quite good today.  She is driving herself and reports that she lives with her brother and sister who also have health issues in the assisting each other's care.  Past Medical History:  Diagnosis Date   Anxiety    Arthritis    hands and Knees   [place]   Benign essential HTN 01/17/2012   [place]   [place]   [place]    Constipation    Depression    Takes Klonopin   Depression with anxiety 01/17/2012   GERD (gastroesophageal reflux disease)    Hypercholesteremia    Hypertension    takes meds   Hypothyroidism    on Levonthyroxine   Hypothyroidism (acquired) 01/17/2012   Since age 66  Osteopenia 2018   forearm   Osteoporosis 07/25/2021   forearm   Ovarian cyst 01/17/2012   1995  BSO   Personal history of colonic polyps 11/07/2007   Pneumonia    x 2- last time > 5 years ago   Pneumonia 01/17/2012   10 yrs ago and 5 years ago    Rash and other nonspecific skin eruption 01/17/2012   Dorsum hands & extensor surfaces arms; intermittent    Family History  Problem Relation Age of Onset   Cancer Mother        lung    Heart failure Father    Breast cancer Sister 467      dec age 65  Cancer Sister    Heart attack Brother 646  Heart attack Brother    Hypertension Son    Ovarian cancer Paternal Grandmother        dec age 65  Anesthesia problems Neg Hx    Hypotension Neg Hx    Malignant hyperthermia Neg Hx    Pseudochol deficiency Neg Hx    Colon cancer Neg Hx    Esophageal cancer Neg Hx    Rectal cancer Neg Hx    Stomach cancer Neg Hx     SOCIAL HISTORY: Social History   Tobacco Use   Smoking status: Every Day    Packs/day: 1.00    Years: 40.00    Total pack years: 40.00    Types: Cigarettes   Smokeless tobacco: Never   Tobacco comments:    pt states that she wants to quit but needs help was advised to call 1-800-QUITNOW and to speak with PCP about avaliable aids  Substance Use Topics   Alcohol use: No    Alcohol/week: 0.0 standard drinks of alcohol    Allergies  Allergen Reactions   Sulfa Antibiotics Rash    Current Outpatient Medications  Medication Sig Dispense Refill  ADVAIR DISKUS 250-50 MCG/ACT AEPB Inhale 1 puff into the lungs in the morning and at bedtime.     ARIPiprazole (ABILIFY) 10 MG tablet Take 10 mg by mouth daily. Takes 1/2 daily     aspirin EC 81 MG tablet Take 81 mg by mouth daily. Swallow whole.     atorvastatin (LIPITOR) 40 MG tablet Take 40 mg by mouth daily.     brimonidine (ALPHAGAN) 0.2 % ophthalmic solution Place 1 drop into the right eye 3 (three) times daily.     CALCIUM PO Take 1 tablet by mouth daily.     clonazePAM (KLONOPIN) 0.5 MG tablet Take 1 mg by mouth 2 (two) times daily.     dorzolamide-timolol (COSOPT) 22.3-6.8 MG/ML ophthalmic solution Place 1 drop into the right eye 2 (two) times daily.     ELDERBERRY PO Take by mouth.     hydrochlorothiazide (HYDRODIURIL) 25 MG tablet Take 0.5 tablets (12.5 mg total) by mouth daily.     levothyroxine (SYNTHROID) 125 MCG tablet Take 125 mcg by mouth daily.     lisinopril (ZESTRIL) 20 MG tablet Take 20  mg by mouth daily.     Multiple Vitamins-Minerals (MULTIVITAMINS THER. W/MINERALS) TABS tablet Take 1 tablet by mouth daily.     venlafaxine (EFFEXOR-XR) 150 MG 24 hr capsule Take 300 mg by mouth daily with breakfast.     VITAMIN D PO Take 2,000 Units by mouth daily.     zolpidem (AMBIEN) 10 MG tablet Take 10 mg by mouth at bedtime as needed for sleep.     dextromethorphan-guaiFENesin (MUCINEX DM) 30-600 MG 12hr tablet Take 1 tablet by mouth 2 (two) times daily. (Patient not taking: Reported on 08/02/2022) 20 tablet 0   nicotine (NICODERM CQ - DOSED IN MG/24 HOURS) 14 mg/24hr patch Place 1 patch (14 mg total) onto the skin daily. (Patient not taking: Reported on 08/02/2022) 28 patch 0   No current facility-administered medications for this visit.    REVIEW OF SYSTEMS:  '[X]'$  denotes positive finding, '[ ]'$  denotes negative finding Cardiac  Comments:  Chest pain or chest pressure:    Shortness of breath upon exertion:    Short of breath when lying flat:    Irregular heart rhythm:        Vascular    Pain in calf, thigh, or hip brought on by ambulation:    Pain in feet at night that wakes you up from your sleep:     Blood clot in your veins:    Leg swelling:           PHYSICAL EXAM: Vitals:   08/02/22 1303 08/02/22 1306  BP: 130/86 (!) 146/88  Pulse: 60   Temp: (!) 97.2 F (36.2 C)   SpO2: 96%   Weight: 142 lb (64.4 kg)   Height: '5\' 6"'$  (1.676 m)     GENERAL: The patient is a well-nourished female, in no acute distress. The vital signs are documented above. CARDIOVASCULAR: Carotid arteries without bruits bilaterally.  2+ radial pulses bilaterally. PULMONARY: There is good air exchange  MUSCULOSKELETAL: There are no major deformities or cyanosis. NEUROLOGIC: No focal weakness or paresthesias are detected. SKIN: There are no ulcers or rashes noted. PSYCHIATRIC: The patient has a normal affect.  DATA:  Duplex unfortunately was not done today.  MEDICAL ISSUES: Patient doing  well overall.  Fortunately she did not have her duplex scheduled today.  I apologized to the patient and we will see her back in several weeks at  her convenience for duplex.  I explained that there would be no charge for today's visit.    Rosetta Posner, MD FACS [facility] [phone]  Note: Portions of this report may have been transcribed using voice recognition software.  Every effort has been made to ensure accuracy; however, inadvertent computerized transcription errors may still be present.

## 2022-08-04 ENCOUNTER — Other Ambulatory Visit: Payer: Self-pay

## 2022-08-04 DIAGNOSIS — I6523 Occlusion and stenosis of bilateral carotid arteries: Secondary | ICD-10-CM

## 2022-08-15 ENCOUNTER — Ambulatory Visit: Payer: Medicare HMO | Admitting: Obstetrics and Gynecology

## 2022-08-23 ENCOUNTER — Ambulatory Visit
Admission: RE | Admit: 2022-08-23 | Discharge: 2022-08-23 | Disposition: A | Discharge status: Home / Self Care | Payer: Medicare HMO | Source: Ambulatory Visit | Attending: Nurse Practitioner | Admitting: Nurse Practitioner

## 2022-08-23 DIAGNOSIS — Z1231 Encounter for screening mammogram for malignant neoplasm of breast: Secondary | ICD-10-CM

## 2022-08-30 ENCOUNTER — Encounter: Payer: Self-pay | Admitting: Vascular Surgery

## 2022-08-30 ENCOUNTER — Ambulatory Visit: Payer: Medicare HMO | Admitting: Vascular Surgery

## 2022-08-30 ENCOUNTER — Ambulatory Visit (INDEPENDENT_AMBULATORY_CARE_PROVIDER_SITE_OTHER): Payer: Medicare HMO

## 2022-08-30 VITALS — BP 130/83 | HR 90 | Temp 97.5°F | Ht 66.0 in | Wt 136.6 lb

## 2022-08-30 DIAGNOSIS — I6523 Occlusion and stenosis of bilateral carotid arteries: Secondary | ICD-10-CM | POA: Diagnosis not present

## 2022-08-30 NOTE — Progress Notes (Signed)
[facility]ist of [place]  Patient name: Dorothy [name] MRN: [id number] DOB: 1956-06-09 Sex: female  REASON FOR VISIT: Follow-up known carotid disease  HPI: Dorothy [name] is a 66 y.o. female here today for follow-up.  I saw her in my office on 08/02/2022.  There had been a scheduling error from my office since she did not have a duplex at that time.  She is here today for duplex.  She continues to have new neurologic deficits.  Past Medical History:  Diagnosis Date   Anxiety    Arthritis    hands and Knees   Avascular necrosis of bones of both hips ([place]) 01/17/2012   Benign essential HTN 01/17/2012   Cancer ([place]) 1982   ovarian   Cancer of cervix ([place])    Constipation    Depression    Takes Klonopin   Depression with anxiety 01/17/2012   GERD (gastroesophageal reflux disease)    Hypercholesteremia    Hypertension    takes meds   Hypothyroidism    on Levonthyroxine   Hypothyroidism (acquired) 01/17/2012   Since age 68   Osteopenia 2018   forearm   Osteoporosis 07/25/2021   forearm   Ovarian cyst 01/17/2012   1995  BSO   Personal history of colonic polyps 11/07/2007   Pneumonia    x 2- last time > 5 years ago   Pneumonia 01/17/2012   10 yrs ago and 5 years ago    Rash and other nonspecific skin eruption 01/17/2012   Dorsum hands & extensor surfaces arms; intermittent    Family History  Problem Relation Age of Onset   Cancer Mother        lung   Heart failure Father    Breast cancer Sister 34       dec age 84   Cancer Sister    Heart attack Brother 62   Heart attack Brother    Hypertension Son    Ovarian cancer Paternal Grandmother        dec age 21   Anesthesia problems Neg Hx    Hypotension Neg Hx    Malignant hyperthermia Neg Hx    Pseudochol deficiency Neg Hx    Colon cancer Neg Hx    Esophageal cancer Neg Hx    Rectal cancer Neg Hx    Stomach cancer Neg Hx     SOCIAL HISTORY: Social  History   Tobacco Use   Smoking status: Every Day    Packs/day: 1.00    Years: 40.00    Total pack years: 40.00    Types: Cigarettes   Smokeless tobacco: Never   Tobacco comments:    pt states that she wants to quit but needs help was advised to call 1-800-QUITNOW and to speak with PCP about avaliable aids  Substance Use Topics   Alcohol use: No    Alcohol/week: 0.0 standard drinks of alcohol    Allergies  Allergen Reactions   Sulfa Antibiotics Rash    Current Outpatient Medications  Medication Sig Dispense Refill   ADVAIR DISKUS 250-50 MCG/ACT AEPB Inhale 1 puff into the lungs in the morning and at bedtime.     ARIPiprazole (ABILIFY) 10 MG tablet Take 10 mg by mouth daily. Takes 1/2 daily     aspirin EC 81 MG tablet Take 81 mg by mouth daily. Swallow whole.     atorvastatin (LIPITOR) 40 MG tablet Take 40 mg by mouth daily.     brimonidine (ALPHAGAN) 0.2 %  ophthalmic solution Place 1 drop into the right eye 3 (three) times daily.     CALCIUM PO Take 1 tablet by mouth daily.     clonazePAM (KLONOPIN) 0.5 MG tablet Take 1 mg by mouth 2 (two) times daily.     dorzolamide-timolol (COSOPT) 22.3-6.8 MG/ML ophthalmic solution Place 1 drop into the right eye 2 (two) times daily.     ELDERBERRY PO Take by mouth.     hydrochlorothiazide (HYDRODIURIL) 25 MG tablet Take 0.5 tablets (12.5 mg total) by mouth daily.     levothyroxine (SYNTHROID) 125 MCG tablet Take 125 mcg by mouth daily.     lisinopril (ZESTRIL) 20 MG tablet Take 20 mg by mouth daily.     Multiple Vitamins-Minerals (MULTIVITAMINS THER. W/MINERALS) TABS tablet Take 1 tablet by mouth daily.     venlafaxine (EFFEXOR-XR) 150 MG 24 hr capsule Take 300 mg by mouth daily with breakfast.     VITAMIN D PO Take 2,000 Units by mouth daily.     zolpidem (AMBIEN) 10 MG tablet Take 10 mg by mouth at bedtime as needed for sleep.     dextromethorphan-guaiFENesin (MUCINEX DM) 30-600 MG 12hr tablet Take 1 tablet by mouth 2 (two) times daily.  (Patient not taking: Reported on 08/02/2022) 20 tablet 0   nicotine (NICODERM CQ - DOSED IN MG/24 HOURS) 14 mg/24hr patch Place 1 patch (14 mg total) onto the skin daily. (Patient not taking: Reported on 08/02/2022) 28 patch 0   No current facility-administered medications for this visit.    REVIEW OF SYSTEMS:  '[X]'$  denotes positive finding, '[ ]'$  denotes negative finding Cardiac  Comments:  Chest pain or chest pressure:    Shortness of breath upon exertion:    Short of breath when lying flat:    Irregular heart rhythm:        Vascular    Pain in calf, thigh, or hip brought on by ambulation:    Pain in feet at night that wakes you up from your sleep:     Blood clot in your veins:    Leg swelling:           PHYSICAL EXAM: Vitals:   08/30/22 1244 08/30/22 1246  BP: (!) 145/82 130/83  Pulse: 90   Temp: (!) 97.5 F (36.4 C)   SpO2: 92%   Weight: 136 lb 9.6 oz (62 kg)   Height: '5\' 6"'$  (1.676 m)     GENERAL: The patient is a well-nourished female, in no acute distress. The vital signs are documented above. CARDIOVASCULAR: She does have bilateral soft carotid bruits. PULMONARY: There is good air exchange  MUSCULOSKELETAL: There are no major deformities or cyanosis. NEUROLOGIC: No focal weakness or paresthesias are detected. SKIN: There are no ulcers or rashes noted. PSYCHIATRIC: The patient has a normal affect.  DATA:  Duplex today reveals known occlusion of her right common and internal carotid arteries.  On the left her velocities suggest 60 to 79% stenosis.  She does have severe external carotid stenosis on the left.  MEDICAL ISSUES: Stable duplex follow-up.  In reviewing her CT scan from April 2023, she did have moderate only carotid stenosis on the left.  Her velocities in all likelihood are elevated due to compensatory flow with her right internal artery occlusion.  She was reassured with this discussion.  We will see her again in 1 year with repeat carotid duplex    Rosetta Posner, MD FACS [facility] Tel [phone])  [phone]  Note: Portions of this report may have been transcribed using voice recognition software.  Every effort has been made to ensure accuracy; however, inadvertent computerized transcription errors may still be present.

## 2022-09-19 NOTE — Progress Notes (Deleted)
66 y.o. G43P1011 Widowed Caucasian female here for annual exam.    PCP:     Patient's last menstrual period was 10/23/1980.           Sexually active: {yes no:314532}  The current method of family planning is status post hysterectomy.    Exercising: {yes no:314532}  {types:19826} Smoker:  {YES [id number]  Health Maintenance: Pap:  08/16/21:  vaginal cuff biopsies , 07/14/20, LGSIL HPV HR neg/colpo  History of abnormal Pap:  yes MMG:  08/23/22, Breast Density Category B, BI-RADS CATEGORY 1 Negative Colonoscopy:  03/07/13, polyp removed, f/u in 10 years BMD:   08/22/21  Result  osteoporotic, hx of osteopenia TDaP:  PCP Gardasil:   {YES NO:22349} HIV: Hep C: Screening Labs:  Hb today: ***, Urine today: ***   reports that she has been smoking cigarettes. She has a 40.00 pack-year smoking history. She has never used smokeless tobacco. She reports that she does not drink alcohol and does not use drugs.  Past Medical History:  Diagnosis Date   Anxiety    Arthritis    hands and Knees   Avascular necrosis of bones of both hips ([place]) 01/17/2012   Benign essential HTN 01/17/2012   Cancer ([place]) 1982   ovarian   Cancer of cervix ([place])    Constipation    Depression    Takes Klonopin   Depression with anxiety 01/17/2012   GERD (gastroesophageal reflux disease)    Hypercholesteremia    Hypertension    takes meds   Hypothyroidism    on Levonthyroxine   Hypothyroidism (acquired) 01/17/2012   Since age 89   Osteopenia 2018   forearm   Osteoporosis 07/25/2021   forearm   Ovarian cyst 01/17/2012   1995  BSO   Personal history of colonic polyps 11/07/2007   Pneumonia    x 2- last time > 5 years ago   Pneumonia 01/17/2012   10 yrs ago and 5 years ago    Rash and other nonspecific skin eruption 01/17/2012   Dorsum hands & extensor surfaces arms; intermittent    Past Surgical History:  Procedure Laterality Date   ABDOMINAL HYSTERECTOMY  1982   ovaries retained--for CIS   CELIAC  ARTERY BYPASS  09/25/2012   Procedure: CELIAC ARTERY BYPASS;  Surgeon: Elam Dutch, MD;  Location: [facility];  Service: Vascular;  Laterality: N/A;  Bypass using Hemashield Gold Vascular Graft 70m x 644mx 40cm   COLONOSCOPY  2009   [place]-polyps, 5 yr FU   JOINT REPLACEMENT     MESENTERIC ARTERY BYPASS  09/25/2012   Procedure: MESENTERIC ARTERY BYPASS;  Surgeon: ChElam DutchMD;  [place] [facility] [place];  Laterality: N/A;  Bypass using Hemashield Gold Vascular Graft 121m 6mm12m40cm   OVARIAN CYST REMOVAL  1997   -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   TOTAL HIP ARTHROPLASTY  09/06/2011   Procedure: TOTAL HIP ARTHROPLASTY;  Surgeon: DaniNinetta Lights;  [facility];  Laterality: Left;  TOTAL HIP ARTHROPLASTY LEFT SIDE   TOTAL HIP ARTHROPLASTY  10/11/2011   Procedure: TOTAL HIP ARTHROPLASTY;  Surgeon: DaniNinetta Lights;  [facility];  Laterality: Right;  120 MINUTES FOR THIS SURGERY   WOUND EXPLORATION  10/02/2012   Procedure: WOUND EXPLORATION;  Surgeon: CharElam Dutch;  [facility]  Service: [facility];  Laterality: N/A;  abdominal wound exploration    Current Outpatient Medications  Medication Sig Dispense Refill   ADVAIR DISKUS 250-50 MCG/ACT AEPB Inhale 1 puff into the lungs in the morning and at bedtime.     ARIPiprazole (ABILIFY) 10 MG tablet Take 10 mg by mouth daily. Takes 1/2 daily     aspirin EC 81 MG tablet Take 81 mg by mouth daily. Swallow whole.     atorvastatin (LIPITOR) 40 MG tablet Take 40 mg by mouth daily.     brimonidine (ALPHAGAN) 0.2 % ophthalmic solution Place 1 drop into the right eye 3 (three) times daily.     CALCIUM PO Take 1 tablet by mouth daily.     clonazePAM (KLONOPIN) 0.5 MG tablet Take 1 mg by mouth 2 (two) times daily.     dextromethorphan-guaiFENesin (MUCINEX DM) 30-600 MG 12hr tablet Take 1 tablet by mouth 2 (two) times daily. (Patient not taking: Reported  on 08/02/2022) 20 tablet 0   dorzolamide-timolol (COSOPT) 22.3-6.8 MG/ML ophthalmic solution Place 1 drop into the right eye 2 (two) times daily.     ELDERBERRY PO Take by mouth.     hydrochlorothiazide (HYDRODIURIL) 25 MG tablet Take 0.5 tablets (12.5 mg total) by mouth daily.     levothyroxine (SYNTHROID) 125 MCG tablet Take 125 mcg by mouth daily.     lisinopril (ZESTRIL) 20 MG tablet Take 20 mg by mouth daily.     Multiple Vitamins-Minerals (MULTIVITAMINS THER. W/MINERALS) TABS tablet Take 1 tablet by mouth daily.     nicotine (NICODERM CQ - DOSED IN MG/24 HOURS) 14 mg/24hr patch Place 1 patch (14 mg total) onto the skin daily. (Patient not taking: Reported on 08/02/2022) 28 patch 0   venlafaxine (EFFEXOR-XR) 150 MG 24 hr capsule Take 300 mg by mouth daily with breakfast.     VITAMIN D PO Take 2,000 Units by mouth daily.     zolpidem (AMBIEN) 10 MG tablet Take 10 mg by mouth at bedtime as needed for sleep.     No current facility-administered medications for this visit.    Family History  Problem Relation Age of Onset   Cancer Mother        lung   Heart failure Father    Breast cancer Sister 16       dec age 22   Cancer Sister    Heart attack Brother 71   Heart attack Brother    Hypertension Son    Ovarian cancer Paternal Grandmother        dec age 91   Anesthesia problems Neg Hx    Hypotension Neg Hx    Malignant hyperthermia Neg Hx    Pseudochol deficiency Neg Hx    Colon cancer Neg Hx    Esophageal cancer Neg Hx    Rectal cancer Neg Hx    Stomach cancer Neg Hx     Review of Systems  Exam:   LMP 10/23/1980 Comment: Hyst age 13(ovaries retained)    General appearance: alert, cooperative and appears stated age Head: normocephalic, without obvious abnormality, atraumatic Neck: no adenopathy, supple, symmetrical, trachea midline and thyroid normal to inspection and palpation Lungs: clear to auscultation bilaterally Breasts: normal appearance, no masses or tenderness,  No nipple retraction or dimpling, No nipple discharge or bleeding, No axillary adenopathy Heart: regular rate and rhythm Abdomen: soft, non-tender; no masses, no organomegaly Extremities: extremities normal, atraumatic, no cyanosis or edema Skin: skin color, texture, turgor normal. No rashes or lesions Lymph nodes: cervical, supraclavicular, and axillary nodes normal. Neurologic: grossly normal  Pelvic: External genitalia:  no  lesions              No abnormal inguinal nodes palpated.              Urethra:  normal appearing urethra with no masses, tenderness or lesions              Bartholins and Skenes: normal                 Vagina: normal appearing vagina with normal color and discharge, no lesions              Cervix: no lesions              Pap taken: {yes no:314532} Bimanual Exam:  Uterus:  normal size, contour, position, consistency, mobility, non-tender              Adnexa: no mass, fullness, tenderness              Rectal exam: {yes no:314532}.  Confirms.              Anus:  normal sphincter tone, no lesions  Chaperone was present for exam:  ***  Assessment:   Well woman visit with gynecologic exam.   Plan: Mammogram screening discussed. Self breast awareness reviewed. Pap and HR HPV as above. Guidelines for Calcium, Vitamin D, regular exercise program including cardiovascular and weight bearing exercise.   Follow up annually and prn.   Additional counseling given.  {yes [id number]. _______ minutes face to face time of which over 50% was spent in counseling.    After visit summary provided.

## 2022-09-21 ENCOUNTER — Encounter: Payer: Self-pay | Admitting: Obstetrics and Gynecology

## 2022-09-21 ENCOUNTER — Ambulatory Visit (INDEPENDENT_AMBULATORY_CARE_PROVIDER_SITE_OTHER): Payer: Medicare HMO | Admitting: Obstetrics and Gynecology

## 2022-09-21 ENCOUNTER — Other Ambulatory Visit (HOSPITAL_COMMUNITY)
Admission: RE | Admit: 2022-09-21 | Discharge: 2022-09-21 | Disposition: A | Discharge status: Home / Self Care | Payer: Medicare HMO | Source: Ambulatory Visit | Attending: Obstetrics and Gynecology | Admitting: Obstetrics and Gynecology

## 2022-09-21 ENCOUNTER — Ambulatory Visit: Payer: Medicare HMO | Admitting: Obstetrics and Gynecology

## 2022-09-21 VITALS — BP 132/80 | HR 70 | Ht 67.0 in | Wt 137.0 lb

## 2022-09-21 DIAGNOSIS — Z9189 Other specified personal risk factors, not elsewhere classified: Secondary | ICD-10-CM

## 2022-09-21 DIAGNOSIS — Z01419 Encounter for gynecological examination (general) (routine) without abnormal findings: Secondary | ICD-10-CM | POA: Insufficient documentation

## 2022-09-21 DIAGNOSIS — Z87411 Personal history of vaginal dysplasia: Secondary | ICD-10-CM | POA: Insufficient documentation

## 2022-09-21 DIAGNOSIS — Z1151 Encounter for screening for human papillomavirus (HPV): Secondary | ICD-10-CM | POA: Diagnosis not present

## 2022-09-21 DIAGNOSIS — M810 Age-related osteoporosis without current pathological fracture: Secondary | ICD-10-CM | POA: Diagnosis not present

## 2022-09-21 DIAGNOSIS — Z0289 Encounter for other administrative examinations: Secondary | ICD-10-CM

## 2022-09-21 NOTE — Progress Notes (Signed)
66 y.o. G9P1011 Widowed Caucasian female here for annual exam.    She is followed for abnormal paps and osteoporosis.  She did not treat osteoporosis last year due to dental work she was doing.  She completed dental care at least 4 months ago or more.  Hx left wrist fracture years ago.   Low calcium level on chart review.   She takes calcium and vit D.   No new partner.   PCP:   Angelina Ok, NP  Patient's last menstrual period was 10/23/1980.           Sexually active: No.  The current method of family planning is post menopausal status.    Exercising: No.   Smoker:  yes  Health Maintenance: Pap:  08/12/2021 neg,hpv neg, 07-14-20 LGSIL HPV HR neg/colpo 08/16/20:  vaginal cuff biopsies LGSIL. pap 04-16-19 neg HPV HR neg, 03-20-18 LGSIL HPV HR neg  History of abnormal Pap:  yes MMG:  08/23/2022 BI-RADS CATEGORY 1: Negative. ACR Breast Density Category b  Colonoscopy:  03/07/2013 BMD:   08/22/2021  Result  osteoporosis.  T score -2.7 left forearm, T score -1.8 of spine. TDaP:  pcp Gardasil:   n/a Screening Labs:  PCP   reports that she has been smoking cigarettes. She has a 40.00 pack-year smoking history. She has never used smokeless tobacco. She reports that she does not drink alcohol and does not use drugs.  Past Medical History:  Diagnosis Date   Anxiety    Arthritis    hands and Knees   Avascular necrosis of bones of both hips ([place]) 01/17/2012   Benign essential HTN 01/17/2012   Cancer ([place]) 1982   ovarian   Cancer of cervix ([place])    Constipation    Depression    Takes Klonopin   Depression with anxiety 01/17/2012   GERD (gastroesophageal reflux disease)    Hypercholesteremia    Hypertension    takes meds   Hypothyroidism    on Levonthyroxine   Hypothyroidism (acquired) 01/17/2012   Since age 73   Osteopenia 2018   forearm   Osteoporosis 07/25/2021   forearm   Ovarian cyst 01/17/2012   1995  BSO   Personal history of colonic polyps 11/07/2007    Pneumonia    x 2- last time > 5 years ago   Pneumonia 01/17/2012   10 yrs ago and 5 years ago    Rash and other nonspecific skin eruption 01/17/2012   Dorsum hands & extensor surfaces arms; intermittent    Past Surgical History:  Procedure Laterality Date   ABDOMINAL HYSTERECTOMY  1982   ovaries retained--for CIS   CELIAC ARTERY BYPASS  09/25/2012   Procedure: CELIAC ARTERY BYPASS;  Surgeon: Elam Dutch, MD;  Location: [facility];  Service: Vascular;  Laterality: N/A;  Bypass using Hemashield Gold Vascular Graft 51m x 616mx 40cm   COLONOSCOPY  2009   [place]-polyps, 5 yr FU   JOINT REPLACEMENT     MESENTERIC ARTERY BYPASS  09/25/2012   Procedure: MESENTERIC ARTERY BYPASS;  Surgeon: ChElam DutchMD;  [place];  Laterality: N/A;  Bypass using Hemashield Gold Vascular Graft 1270m 6mm40m40cm   OVARIAN CYST REMOVAL  1997   -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   TOTAL HIP ARTHROPLASTY  09/06/2011   Procedure: TOTAL HIP ARTHROPLASTY;  Surgeon: DaniNinetta Lights;  [facility];  Laterality: Left;  TOTAL HIP ARTHROPLASTY LEFT  SIDE   TOTAL HIP ARTHROPLASTY  10/11/2011   Procedure: TOTAL HIP ARTHROPLASTY;  Surgeon: Ninetta Lights, MD;  Location: [facility];  Service: Orthopedics;  Laterality: Right;  120 MINUTES FOR THIS SURGERY   WOUND EXPLORATION  10/02/2012   Procedure: WOUND EXPLORATION;  Surgeon: Elam Dutch, MD;  Location: [facility];  Service: Vascular;  Laterality: N/A;  abdominal wound exploration    Current Outpatient Medications  Medication Sig Dispense Refill   ADVAIR DISKUS 250-50 MCG/ACT AEPB Inhale 1 puff into the lungs in the morning and at bedtime.     ARIPiprazole (ABILIFY) 10 MG tablet Take 10 mg by mouth daily. Takes 1/2 daily     aspirin EC 81 MG tablet Take 81 mg by mouth daily. Swallow whole.     atorvastatin (LIPITOR) 40 MG tablet Take 40 mg by mouth daily.     brimonidine (ALPHAGAN) 0.2 %  ophthalmic solution Place 1 drop into the right eye 3 (three) times daily.     CALCIUM PO Take 1 tablet by mouth daily.     clonazePAM (KLONOPIN) 0.5 MG tablet Take 1 mg by mouth 2 (two) times daily.     dextromethorphan-guaiFENesin (MUCINEX DM) 30-600 MG 12hr tablet Take 1 tablet by mouth 2 (two) times daily. 20 tablet 0   dorzolamide-timolol (COSOPT) 22.3-6.8 MG/ML ophthalmic solution Place 1 drop into the right eye 2 (two) times daily.     ELDERBERRY PO Take by mouth.     hydrochlorothiazide (HYDRODIURIL) 25 MG tablet Take 0.5 tablets (12.5 mg total) by mouth daily.     levothyroxine (SYNTHROID) 125 MCG tablet Take 125 mcg by mouth daily.     lisinopril (ZESTRIL) 20 MG tablet Take 20 mg by mouth daily.     Multiple Vitamins-Minerals (MULTIVITAMINS THER. W/MINERALS) TABS tablet Take 1 tablet by mouth daily.     nicotine (NICODERM CQ - DOSED IN MG/24 HOURS) 14 mg/24hr patch Place 1 patch (14 mg total) onto the skin daily. 28 patch 0   venlafaxine (EFFEXOR-XR) 150 MG 24 hr capsule Take 300 mg by mouth daily with breakfast.     VITAMIN D PO Take 2,000 Units by mouth daily.     zolpidem (AMBIEN) 10 MG tablet Take 10 mg by mouth at bedtime as needed for sleep.     No current facility-administered medications for this visit.    Family History  Problem Relation Age of Onset   Cancer Mother        lung   Heart failure Father    Breast cancer Sister 92       dec age 41   Cancer Sister    Heart attack Brother 46   Heart attack Brother    Hypertension Son    Ovarian cancer Paternal Grandmother        dec age 3   Anesthesia problems Neg Hx    Hypotension Neg Hx    Malignant hyperthermia Neg Hx    Pseudochol deficiency Neg Hx    Colon cancer Neg Hx    Esophageal cancer Neg Hx    Rectal cancer Neg Hx    Stomach cancer Neg Hx     Review of Systems  All other systems reviewed and are negative.   Exam:   BP 132/80 (BP Location: Right Arm, Patient Position: Sitting, Cuff Size: Normal)    Pulse 70   Ht '5\' 7"'$  (1.702 m)   Wt 137 lb (62.1 kg)   LMP 10/23/1980 Comment: Hyst age 21(ovaries retained)  BMI 21.46 kg/m     General appearance: alert, cooperative and appears stated age Head: normocephalic, without obvious abnormality, atraumatic Neck: no adenopathy, supple, symmetrical, trachea midline and thyroid normal to inspection and palpation Lungs: clear to auscultation bilaterally Breasts: normal appearance, no masses or tenderness, No nipple retraction or dimpling, No nipple discharge or bleeding, No axillary adenopathy Heart: regular rate and rhythm Abdomen: soft, non-tender; no masses, no organomegaly Extremities: extremities normal, atraumatic, no cyanosis or edema Skin: skin color, texture, turgor normal. No rashes or lesions Lymph nodes: cervical, supraclavicular, and axillary nodes normal. Neurologic: grossly normal  Pelvic: External genitalia:  no lesions              No abnormal inguinal nodes palpated.              Urethra:  normal appearing urethra with no masses, tenderness or lesions              Bartholins and Skenes: normal                 Vagina: normal appearing vagina with normal color and discharge, no lesions              Cervix: absent              Pap taken: yes Bimanual Exam:  Uterus: absent              Adnexa: no mass, fullness, tenderness              Rectal exam: yes.  Confirms.              Anus:  normal sphincter tone, no lesions  Chaperone was present for exam:  yes  Assessment:    Status post total vaginal hysterectomy for CIS.   Hx VAIN III.  Status post Effudex.  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 FH of ovarian cancer in paternal grandmother and breast cancer in sister. Sister had genetic testing but patient is unaware of the results.   Bilateral carotid artery stenosis.  Hx mesenteric ischemia and bypass surgery. Hx retinal vein occlusion.   Smoker.   Osteoporosis.  Avascular necrosis of hips.  Hypothyroidism.    Plan:  Mammogram screening discussed. Self breast awareness reviewed.  Pap and HR HPV collected. Guidelines for Calcium, Vitamin D, regular exercise program including cardiovascular and weight bearing exercise. BMP and vit D today. Fosamax after blood work back.  Side effects reviewed and patient was instructed in use. Written information given.   Follow up annually and prn.   28 min  total time was spent for this patient encounter, including preparation, face-to-face counseling with the patient, coordination of care, and documentation of the encounter.  After visit summary provided.

## 2022-09-21 NOTE — Patient Instructions (Signed)
EXERCISE AND DIET:  We recommended that you start or continue a regular exercise program for good health. Regular exercise means any activity that makes your heart beat faster and makes you sweat.  We recommend exercising at least 30 minutes per day at least 3 days a week, preferably 4 or 5.  We also recommend a diet low in fat and sugar.  Inactivity, poor dietary choices and obesity can cause diabetes, heart attack, stroke, and kidney damage, among others.    ALCOHOL AND SMOKING:  Women should limit their alcohol intake to no more than 7 drinks/beers/glasses of wine (combined, not each!) per week. Moderation of alcohol intake to this level decreases your risk of breast cancer and liver damage. And of course, no recreational drugs are part of a healthy lifestyle.  And absolutely no smoking or even second hand smoke. Most people know smoking can cause heart and lung diseases, but did you know it also contributes to weakening of your bones? Aging of your skin?  Yellowing of your teeth and nails?  CALCIUM AND VITAMIN D:  Adequate intake of calcium and Vitamin D are recommended.  The recommendations for exact amounts of these supplements seem to change often, but generally speaking 600 mg of calcium (either carbonate or citrate) and 800 units of Vitamin D per day seems prudent. Certain women may benefit from higher intake of Vitamin D.  If you are among these women, your doctor will have told you during your visit.    PAP SMEARS:  Pap smears, to check for cervical cancer or precancers,  have traditionally been done yearly, although recent scientific advances have shown that most women can have pap smears less often.  However, every woman still should have a physical exam from her gynecologist every year. It will include a breast check, inspection of the vulva and vagina to check for abnormal growths or skin changes, a visual exam of the cervix, and then an exam to evaluate the size and shape of the uterus and  ovaries.  And after 65 years of age, a rectal exam is indicated to check for rectal cancers. We will also provide age appropriate advice regarding health maintenance, like when you should have certain vaccines, screening for sexually transmitted diseases, bone density testing, colonoscopy, mammograms, etc.   MAMMOGRAMS:  All women over 40 years old should have a yearly mammogram. Many facilities now offer a "3D" mammogram, which may cost around $50 extra out of pocket. If possible,  we recommend you accept the option to have the 3D mammogram performed.  It both reduces the number of women who will be called back for extra views which then turn out to be normal, and it is better than the routine mammogram at detecting truly abnormal areas.    COLONOSCOPY:  Colonoscopy to screen for colon cancer is recommended for all women at age 50.  We know, you hate the idea of the prep.  We agree, BUT, having colon cancer and not knowing it is worse!!  Colon cancer so often starts as a polyp that can be seen and removed at colonscopy, which can quite literally save your life!  And if your first colonoscopy is normal and you have no family history of colon cancer, most women don't have to have it again for 10 years.  Once every ten years, you can do something that may end up saving your life, right?  We will be happy to help you get it scheduled when you are ready.    Be sure to check your insurance coverage so you understand how much it will cost.  It may be covered as a preventative service at no cost, but you should check your particular policy.    Alendronate Tablets What is this medication? ALENDRONATE (a LEN droe nate) prevents and treats osteoporosis. It may also be used to treat Paget disease of the bone. It works by Paramedic stronger and less likely to break (fracture). It belongs to a group of medications called bisphosphonates. This medicine may be used for other purposes; ask your health care provider  or pharmacist if you have questions. COMMON BRAND NAME(S): Fosamax What should I tell my care team before I take this medication? They need to know if you have any of these conditions: Bleeding disorder Cancer Dental disease Difficulty swallowing Infection (fever, chills, cough, sore throat, pain or trouble passing urine) Kidney disease Low levels of calcium or other minerals in the blood Low red blood cell counts Receiving steroids like dexamethasone or prednisone Stomach or intestine problems Trouble sitting or standing for 30 minutes An unusual or allergic reaction to alendronate, other medications, foods, dyes or preservatives Pregnant or trying to get pregnant Breast-feeding How should I use this medication? Take this medication by mouth with a full glass of water. Take it as directed on the prescription label at the same time every day. Take the dose right after waking up. Do not eat or drink anything before taking it. Do not take it with any other drink except water. Do not chew or crush the tablet. After taking it, do not eat breakfast, drink, or take any other medications or vitamins for at least 30 minutes. Sit or stand up for at least 30 minutes after you take it. Do not lie down. Keep taking it unless your care team tells you to stop. A special MedGuide will be given to you by the pharmacist with each prescription and refill. Be sure to read this information carefully each time. Talk to your care team about the use of this medication in children. Special care may be needed. Overdosage: If you think you have taken too much of this medicine contact a poison control center or emergency room at once. NOTE: This medicine is only for you. Do not share this medicine with others. What if I miss a dose? If you take your medication once a day, skip it. Take your next dose at the scheduled time the next morning. Do not take two doses on the same day. If you take your medication once a  week, take the missed dose on the morning after you remember. Do not take two doses on the same day. What may interact with this medication? Aluminum hydroxide Antacids Aspirin Calcium supplements Medications for inflammation like ibuprofen, naproxen, and others Iron supplements Magnesium supplements Vitamins with minerals This list may not describe all possible interactions. Give your health care provider a list of all the medicines, herbs, non-prescription drugs, or dietary supplements you use. Also tell them if you smoke, drink alcohol, or use illegal drugs. Some items may interact with your medicine. What should I watch for while using this medication? Visit your care team for regular checks on your progress. It may be some time before you see the benefit from this medication. Some people who take this medication have severe bone, joint, or muscle pain. This medication may also increase your risk for jaw problems or a broken thigh bone. Tell your care team right away if  you have severe pain in your jaw, bones, joints, or muscles. Tell you care team if you have any pain that does not go away or that gets worse. Tell your dentist and dental surgeon that you are taking this medication. You should not have major dental surgery while on this medication. See your dentist to have a dental exam and fix any dental problems before starting this medication. Take good care of your teeth while on this medication. Make sure you see your dentist for regular follow-up appointments. You should make sure you get enough calcium and vitamin D while you are taking this medication. Discuss the foods you eat and the vitamins you take with your care team. You may need blood work done while you are taking this medication. What side effects may I notice from receiving this medication? Side effects that you should report to your care team as soon as possible: Allergic reactions--skin rash, itching, hives, swelling of  the face, lips, tongue, or throat Low calcium level--muscle pain or cramps, confusion, tingling, or numbness in the hands or feet Osteonecrosis of the jaw--pain, swelling, or redness in the mouth, numbness of the jaw, poor healing after dental work, unusual discharge from the mouth, visible bones in the mouth Pain or trouble swallowing Severe bone, joint, or muscle pain Stomach bleeding--bloody or black, tar-like stools, vomiting blood or brown material that looks like coffee grounds Side effects that usually do not require medical attention (report to your care team if they continue or are bothersome): Constipation Diarrhea Nausea Stomach pain This list may not describe all possible side effects. Call your doctor for medical advice about side effects. You may report side effects to FDA at 1-800-FDA-1088. Where should I keep my medication? Keep out of the reach of children and pets. Store at room temperature between 15 and 30 degrees C (59 and 86 degrees F). Throw away any unused medication after the expiration date. NOTE: This sheet is a summary. It may not cover all possible information. If you have questions about this medicine, talk to your doctor, pharmacist, or health care provider.  2023 Elsevier/Gold Standard (2020-10-04 00:00:00)

## 2022-09-22 LAB — BASIC METABOLIC PANEL
BUN: 9 mg/dL (ref 7–25)
CO2: 23 mmol/L (ref 20–32)
Calcium: 9.5 mg/dL (ref 8.6–10.4)
Chloride: 89 mmol/L — ABNORMAL LOW (ref 98–110)
Creat: 0.76 mg/dL (ref 0.50–1.05)
Glucose, Bld: 81 mg/dL (ref 65–99)
Potassium: 4.1 mmol/L (ref 3.5–5.3)
Sodium: 125 mmol/L — ABNORMAL LOW (ref 135–146)

## 2022-09-22 LAB — VITAMIN D 25 HYDROXY (VIT D DEFICIENCY, FRACTURES): Vit D, 25-Hydroxy: 74 ng/mL (ref 30–100)

## 2022-09-25 ENCOUNTER — Encounter: Payer: Self-pay | Admitting: Obstetrics and Gynecology

## 2022-09-25 DIAGNOSIS — E871 Hypo-osmolality and hyponatremia: Secondary | ICD-10-CM

## 2022-09-25 HISTORY — DX: Hypo-osmolality and hyponatremia: E87.1

## 2022-09-26 LAB — CYTOLOGY - PAP
Comment: NEGATIVE
Diagnosis: NEGATIVE
High risk HPV: NEGATIVE

## 2022-10-29 ENCOUNTER — Encounter: Payer: Self-pay | Admitting: Obstetrics and Gynecology

## 2022-10-29 ENCOUNTER — Telehealth: Payer: Self-pay | Admitting: Obstetrics and Gynecology

## 2022-10-29 DIAGNOSIS — E871 Hypo-osmolality and hyponatremia: Secondary | ICD-10-CM

## 2022-10-29 DIAGNOSIS — M810 Age-related osteoporosis without current pathological fracture: Secondary | ICD-10-CM

## 2022-10-29 NOTE — Telephone Encounter (Signed)
Please contact patient in follow up to her visit with her PCP for hyponatremia.  Her sodium level here on 09/21/22 was 125.  Her follow up sodium level on 09/25/22 with her PCP was 125.   Please let me know what the plan of care is by the PCP for treating and following her hyponatremia.   We are holding on treating her osteoporosis with Fosamax at this time pending her hyponatremia evaluation with her PCP.

## 2022-10-30 NOTE — Telephone Encounter (Signed)
Per DPR access note on file left message in voice mail that I received message from Dr. Quincy Simmonds to call patient to follow up on her PCP visit for her elevated sodium level.  Dr. Quincy Simmonds is holding on her osteoporosis treatment plan until we hear from her about how PCP treating.  Asked her to call the triage desk and phone number provided in message left.

## 2022-11-01 NOTE — Telephone Encounter (Signed)
VM received from triage line from pt's sister Veneta Penton (not listed on DPR and/or as an alternate contact person in EMR). However, sister is reporting that pt stated she had her sodium levels checked a couple of weeks ago @ "Dr. Kathrin Greathouse" office and they were WNL. Was given a home # to cb if additional information was needed. Please advise.   FYI. There are some records in care everywhere from visit w/ NP on 10/12/2022.

## 2022-11-02 NOTE — Telephone Encounter (Signed)
I recommend referral to endocrinology for patient for osteoporosis.    She has ongoing hyponatremia.

## 2022-11-02 NOTE — Telephone Encounter (Signed)
Message left to return call to Roland at [phone].

## 2022-11-28 NOTE — Telephone Encounter (Signed)
Pt notified and voiced understanding. Will send referral to Dr. Almetta Lovely office.

## 2022-11-30 NOTE — Telephone Encounter (Signed)
Referral successfully faxed.

## 2022-12-14 NOTE — Telephone Encounter (Signed)
LVM w/ referral coordinator to [facility] re: referral and if pt has been contacted and/or scheduled.

## 2022-12-27 NOTE — Telephone Encounter (Signed)
Msg sent to [facility] to assist w/ f/u on referral.

## 2023-01-14 NOTE — Telephone Encounter (Signed)
Please inform me on the status of the patient's referral to Dr. Chalmers Cater.

## 2023-01-15 NOTE — Telephone Encounter (Signed)
Msg sent to [facility] to inquire.

## 2023-01-16 NOTE — Telephone Encounter (Signed)
Per [facility]: Pt was seen by endocrinologist on 12/06/22.   Will route back to Dr. Quincy Simmonds for final review and close encounter.

## 2023-02-06 DIAGNOSIS — J309 Allergic rhinitis, unspecified: Secondary | ICD-10-CM | POA: Insufficient documentation

## 2023-03-16 ENCOUNTER — Encounter (HOSPITAL_COMMUNITY): Payer: Self-pay | Admitting: Emergency Medicine

## 2023-03-16 ENCOUNTER — Inpatient Hospital Stay (HOSPITAL_COMMUNITY)
Admission: EM | Admit: 2023-03-16 | Discharge: 2023-03-19 | DRG: 304 | Disposition: A | Discharge status: Home / Self Care | Payer: Medicare HMO | Attending: Family Medicine | Admitting: Family Medicine

## 2023-03-16 ENCOUNTER — Other Ambulatory Visit: Payer: Self-pay

## 2023-03-16 ENCOUNTER — Emergency Department (HOSPITAL_COMMUNITY): Payer: Medicare HMO

## 2023-03-16 DIAGNOSIS — Z882 Allergy status to sulfonamides status: Secondary | ICD-10-CM

## 2023-03-16 DIAGNOSIS — E039 Hypothyroidism, unspecified: Secondary | ICD-10-CM | POA: Diagnosis present

## 2023-03-16 DIAGNOSIS — K219 Gastro-esophageal reflux disease without esophagitis: Secondary | ICD-10-CM | POA: Diagnosis present

## 2023-03-16 DIAGNOSIS — F419 Anxiety disorder, unspecified: Secondary | ICD-10-CM | POA: Diagnosis present

## 2023-03-16 DIAGNOSIS — J439 Emphysema, unspecified: Secondary | ICD-10-CM | POA: Diagnosis present

## 2023-03-16 DIAGNOSIS — Z8541 Personal history of malignant neoplasm of cervix uteri: Secondary | ICD-10-CM

## 2023-03-16 DIAGNOSIS — I1A0 Resistant hypertension: Secondary | ICD-10-CM | POA: Diagnosis present

## 2023-03-16 DIAGNOSIS — F99 Mental disorder, not otherwise specified: Secondary | ICD-10-CM

## 2023-03-16 DIAGNOSIS — I169 Hypertensive crisis, unspecified: Secondary | ICD-10-CM | POA: Diagnosis not present

## 2023-03-16 DIAGNOSIS — M810 Age-related osteoporosis without current pathological fracture: Secondary | ICD-10-CM | POA: Diagnosis present

## 2023-03-16 DIAGNOSIS — Z7982 Long term (current) use of aspirin: Secondary | ICD-10-CM

## 2023-03-16 DIAGNOSIS — I10 Essential (primary) hypertension: Secondary | ICD-10-CM | POA: Diagnosis present

## 2023-03-16 DIAGNOSIS — E871 Hypo-osmolality and hyponatremia: Principal | ICD-10-CM | POA: Diagnosis present

## 2023-03-16 DIAGNOSIS — R0902 Hypoxemia: Secondary | ICD-10-CM

## 2023-03-16 DIAGNOSIS — F17210 Nicotine dependence, cigarettes, uncomplicated: Secondary | ICD-10-CM | POA: Diagnosis present

## 2023-03-16 DIAGNOSIS — Z79899 Other long term (current) drug therapy: Secondary | ICD-10-CM

## 2023-03-16 DIAGNOSIS — J9601 Acute respiratory failure with hypoxia: Secondary | ICD-10-CM | POA: Diagnosis present

## 2023-03-16 DIAGNOSIS — J441 Chronic obstructive pulmonary disease with (acute) exacerbation: Secondary | ICD-10-CM | POA: Diagnosis present

## 2023-03-16 DIAGNOSIS — Z8249 Family history of ischemic heart disease and other diseases of the circulatory system: Secondary | ICD-10-CM

## 2023-03-16 DIAGNOSIS — Z7983 Long term (current) use of bisphosphonates: Secondary | ICD-10-CM

## 2023-03-16 DIAGNOSIS — J449 Chronic obstructive pulmonary disease, unspecified: Secondary | ICD-10-CM | POA: Insufficient documentation

## 2023-03-16 DIAGNOSIS — Z803 Family history of malignant neoplasm of breast: Secondary | ICD-10-CM

## 2023-03-16 DIAGNOSIS — Z79890 Hormone replacement therapy (postmenopausal): Secondary | ICD-10-CM

## 2023-03-16 DIAGNOSIS — Z86010 Personal history of colonic polyps: Secondary | ICD-10-CM

## 2023-03-16 DIAGNOSIS — F32A Depression, unspecified: Secondary | ICD-10-CM | POA: Diagnosis present

## 2023-03-16 DIAGNOSIS — Z90710 Acquired absence of both cervix and uterus: Secondary | ICD-10-CM

## 2023-03-16 DIAGNOSIS — E7800 Pure hypercholesterolemia, unspecified: Secondary | ICD-10-CM | POA: Diagnosis present

## 2023-03-16 DIAGNOSIS — Z7951 Long term (current) use of inhaled steroids: Secondary | ICD-10-CM

## 2023-03-16 DIAGNOSIS — R0602 Shortness of breath: Secondary | ICD-10-CM | POA: Diagnosis present

## 2023-03-16 DIAGNOSIS — E878 Other disorders of electrolyte and fluid balance, not elsewhere classified: Secondary | ICD-10-CM | POA: Diagnosis present

## 2023-03-16 DIAGNOSIS — Z8041 Family history of malignant neoplasm of ovary: Secondary | ICD-10-CM

## 2023-03-16 DIAGNOSIS — Z96643 Presence of artificial hip joint, bilateral: Secondary | ICD-10-CM | POA: Diagnosis present

## 2023-03-16 LAB — CBC WITH DIFFERENTIAL/PLATELET
Abs Immature Granulocytes: 0.03 10*3/uL (ref 0.00–0.07)
Basophils Absolute: 0 10*3/uL (ref 0.0–0.1)
Basophils Relative: 1 %
Eosinophils Absolute: 0.2 10*3/uL (ref 0.0–0.5)
Eosinophils Relative: 2 %
HCT: 45.6 % (ref 36.0–46.0)
Hemoglobin: 16.4 g/dL — ABNORMAL HIGH (ref 12.0–15.0)
Immature Granulocytes: 0 %
Lymphocytes Relative: 18 %
Lymphs Abs: 1.6 10*3/uL (ref 0.7–4.0)
MCH: 28.1 pg (ref 26.0–34.0)
MCHC: 36 g/dL (ref 30.0–36.0)
MCV: 78.2 fL — ABNORMAL LOW (ref 80.0–100.0)
Monocytes Absolute: 1.3 10*3/uL — ABNORMAL HIGH (ref 0.1–1.0)
Monocytes Relative: 15 %
Neutro Abs: 5.7 10*3/uL (ref 1.7–7.7)
Neutrophils Relative %: 64 %
Platelets: 297 10*3/uL (ref 150–400)
RBC: 5.83 MIL/uL — ABNORMAL HIGH (ref 3.87–5.11)
RDW: 13.8 % (ref 11.5–15.5)
WBC: 8.8 10*3/uL (ref 4.0–10.5)
nRBC: 0 % (ref 0.0–0.2)

## 2023-03-16 LAB — URINALYSIS, ROUTINE W REFLEX MICROSCOPIC
Bilirubin Urine: NEGATIVE
Glucose, UA: NEGATIVE mg/dL
Ketones, ur: NEGATIVE mg/dL
Leukocytes,Ua: NEGATIVE
Nitrite: NEGATIVE
Protein, ur: NEGATIVE mg/dL
Specific Gravity, Urine: 1.001 — ABNORMAL LOW (ref 1.005–1.030)
pH: 7 (ref 5.0–8.0)

## 2023-03-16 LAB — OSMOLALITY: Osmolality: 254 mOsm/kg — ABNORMAL LOW (ref 275–295)

## 2023-03-16 LAB — BASIC METABOLIC PANEL
Anion gap: 13 (ref 5–15)
BUN: 8 mg/dL (ref 8–23)
CO2: 27 mmol/L (ref 22–32)
Calcium: 9.2 mg/dL (ref 8.9–10.3)
Chloride: 79 mmol/L — ABNORMAL LOW (ref 98–111)
Creatinine, Ser: 0.63 mg/dL (ref 0.44–1.00)
GFR, Estimated: >60 mL/min (ref >60)
Glucose, Bld: 93 mg/dL (ref 70–99)
Potassium: 3.6 mmol/L (ref 3.5–5.1)
Sodium: 119 mmol/L — CL (ref 135–145)

## 2023-03-16 LAB — TROPONIN I (HIGH SENSITIVITY): Troponin I (High Sensitivity): 3 ng/L (ref <18)

## 2023-03-16 LAB — TSH: TSH: 5.495 u[IU]/mL — ABNORMAL HIGH (ref 0.350–4.500)

## 2023-03-16 LAB — SODIUM, URINE, RANDOM: Sodium, Ur: 23 mmol/L

## 2023-03-16 MED ORDER — HYDRALAZINE HCL 25 MG PO TABS
25.0000 mg | ORAL_TABLET | ORAL | Status: AC
  Administered 2023-03-16: 25 mg via ORAL
  Filled 2023-03-16: qty 1

## 2023-03-16 MED ORDER — LISINOPRIL 10 MG PO TABS
20.0000 mg | ORAL_TABLET | ORAL | Status: AC
  Administered 2023-03-16: 20 mg via ORAL
  Filled 2023-03-16: qty 2

## 2023-03-16 MED ORDER — IPRATROPIUM BROMIDE 0.02 % IN SOLN
RESPIRATORY_TRACT | Status: AC
  Administered 2023-03-16: 0.5 mg
  Filled 2023-03-16: qty 2.5

## 2023-03-16 MED ORDER — METHYLPREDNISOLONE SODIUM SUCC 125 MG IJ SOLR
125.0000 mg | Freq: Once | INTRAMUSCULAR | Status: AC
  Administered 2023-03-17: 125 mg via INTRAVENOUS
  Filled 2023-03-16: qty 2

## 2023-03-16 MED ORDER — HYDRALAZINE HCL 25 MG PO TABS
50.0000 mg | ORAL_TABLET | ORAL | Status: AC
  Administered 2023-03-16: 50 mg via ORAL
  Filled 2023-03-16: qty 2

## 2023-03-16 MED ORDER — HYDRALAZINE HCL 25 MG PO TABS: 25.0000 mg | ORAL_TABLET | ORAL | Status: DC

## 2023-03-16 MED ORDER — ALBUTEROL SULFATE (2.5 MG/3ML) 0.083% IN NEBU
INHALATION_SOLUTION | RESPIRATORY_TRACT | Status: AC
  Administered 2023-03-16: 5 mg
  Filled 2023-03-16: qty 6

## 2023-03-16 MED ORDER — SODIUM CHLORIDE 0.9 % IV BOLUS (SEPSIS)
1000.0000 mL | Freq: Once | INTRAVENOUS | Status: AC
  Administered 2023-03-16: 1000 mL via INTRAVENOUS

## 2023-03-16 NOTE — ED Provider Notes (Signed)
[place] EMERGENCY DEPARTMENT AT [facility] Provider Note   CSN: [id number] Arrival date & time: 03/16/23  1326     History {Add pertinent medical, surgical, social history, OB history to HPI:1} Chief Complaint  Patient presents with   Hypertension    Dorothy [name] is a 67 y.o. female.  67 year old female with a history of COPD, tobacco use, and hypertension on lisinopril and hydrochlorothiazide presents emergency department with elevated blood pressure.  Patient reports that she has been feeling fine but went to her primary care doctor and had a blood pressure that was in the 200s systolic who advised her to come to the emergency department.  Denies any symptoms at all including chest pain, shortness of breath, headache, confusion, muscle aches, cramping, or tremors.  Has been taking her hydrochlorothiazide 25 daily and lisinopril 20 daily without skipped doses.       Home Medications Prior to Admission medications   Medication Sig Start Date End Date Taking? Authorizing Provider  ADVAIR DISKUS 250-50 MCG/ACT AEPB Inhale 1 puff into the lungs in the morning and at bedtime. 12/02/21   [provider]  ARIPiprazole (ABILIFY) 10 MG tablet Take 10 mg by mouth daily. Takes 1/2 daily 02/16/17   [provider]  aspirin EC 81 MG tablet Take 81 mg by mouth daily. Swallow whole.    [provider]  atorvastatin (LIPITOR) 40 MG tablet Take 40 mg by mouth daily. 06/19/22   [provider]  brimonidine (ALPHAGAN) 0.2 % ophthalmic solution Place 1 drop into the right eye 3 (three) times daily.    [provider]  CALCIUM PO Take 1 tablet by mouth daily.    [provider]  clonazePAM (KLONOPIN) 0.5 MG tablet Take 1 mg by mouth 2 (two) times daily.    [provider]  dextromethorphan-guaiFENesin (MUCINEX DM) 30-600 MG 12hr tablet Take 1 tablet by mouth 2 (two) times daily. 11/10/21   Vassie Loll, MD   dorzolamide-timolol (COSOPT) 22.3-6.8 MG/ML ophthalmic solution Place 1 drop into the right eye 2 (two) times daily.    [provider]  ELDERBERRY PO Take by mouth.    [provider]  hydrochlorothiazide (HYDRODIURIL) 25 MG tablet Take 0.5 tablets (12.5 mg total) by mouth daily. 11/12/21   Vassie Loll, MD  levothyroxine (SYNTHROID) 125 MCG tablet Take 125 mcg by mouth daily. 07/06/20   [provider]  lisinopril (ZESTRIL) 20 MG tablet Take 20 mg by mouth daily. 07/05/20   [provider]  Multiple Vitamins-Minerals (MULTIVITAMINS THER. W/MINERALS) TABS tablet Take 1 tablet by mouth daily.    [provider]  nicotine (NICODERM CQ - DOSED IN MG/24 HOURS) 14 mg/24hr patch Place 1 patch (14 mg total) onto the skin daily. 11/11/21   Vassie Loll, MD  venlafaxine (EFFEXOR-XR) 150 MG 24 hr capsule Take 300 mg by mouth daily with breakfast.    [provider]  VITAMIN D PO Take 2,000 Units by mouth daily.    [provider]  zolpidem (AMBIEN) 10 MG tablet Take 10 mg by mouth at bedtime as needed for sleep. 02/04/19   [provider]      Allergies    Sulfa antibiotics    Review of Systems   Review of Systems  Physical Exam Updated Vital Signs BP (!) 192/99   Pulse 69   Temp 97.7 F (36.5 C) (Temporal)   Resp 17   Ht 5\' 7"  (1.702 m)   Wt 59 kg  LMP 10/23/1980 Comment: Hyst age 19(ovaries retained)  SpO2 94%   BMI 20.36 kg/m  Physical Exam  ED Results / Procedures / Treatments   Labs (all labs ordered are listed, but only abnormal results are displayed) Labs Reviewed  BASIC METABOLIC PANEL - Abnormal; Notable for the following components:      Result Value   Sodium 119 (*)    Chloride 79 (*)    All other components within normal limits  CBC WITH DIFFERENTIAL/PLATELET - Abnormal; Notable for the following components:   RBC 5.83 (*)    Hemoglobin 16.4 (*)    MCV 78.2 (*)    Monocytes Absolute 1.3 (*)     All other components within normal limits  TROPONIN I (HIGH SENSITIVITY)  TROPONIN I (HIGH SENSITIVITY)    EKG None  Radiology No results found.  Procedures Procedures  {Document cardiac monitor, telemetry assessment procedure when appropriate:1}  Medications Ordered in ED Medications - No data to display  ED Course/ Medical Decision Making/ A&P Clinical Course as of 03/16/23 1804  Fri Mar 16, 2023  1744 Sodium(!!): 119 125 five months ago [RP]    Clinical Course User Index [RP] Rondel Baton, MD   {   Click here for ABCD2, HEART and other calculatorsREFRESH Note before signing :1}                          Medical Decision Making Amount and/or Complexity of Data Reviewed Labs: ordered. Decision-making details documented in ED Course. Radiology: ordered.  Risk Prescription drug management.   ***  {Document critical care time when appropriate:1} {Document review of labs and clinical decision tools ie heart score, Chads2Vasc2 etc:1}  {Document your independent review of radiology images, and any outside records:1} {Document your discussion with family members, caretakers, and with consultants:1} {Document social determinants of health affecting pt's care:1} {Document your decision making why or why not admission, treatments were needed:1} Final Clinical Impression(s) / ED Diagnoses Final diagnoses:  None    Rx / DC Orders ED Discharge Orders     None

## 2023-03-16 NOTE — ED Notes (Signed)
Pt walked to the bathroom and back to the room unassisted. Pt was placed back on monitor and pt's O2 on RA was 79%. RN notified and placed on 2L.

## 2023-03-16 NOTE — ED Provider Triage Note (Signed)
Emergency Medicine Provider Triage Evaluation Note  Dorothy [name] , a 67 y.o. female  was evaluated in triage.  Pt complains of high blood pressure.  Patient states that she woke up today and her blood pressure was 229/100.  Patient states he has been taking her blood pressure medications as prescribed.  Patient called her primary care provider and was referred to ER.  Patient denies any headache, vision changes, chest pain, shortness of breath, neck pain.  Review of Systems  Positive: See HPI Negative: See HPI  Physical Exam  BP (!) 210/100 (BP Location: Left Arm)   Pulse 65   Temp 97.7 F (36.5 C) (Temporal)   Resp 19   Ht 5\' 7"  (1.702 m)   Wt 59 kg   LMP 10/23/1980 Comment: Hyst age 53(ovaries retained)  SpO2 96%   BMI 20.36 kg/m  Gen:   Awake, no distress   Resp:  Normal effort  MSK:   Moves extremities without difficulty  Other:    Medical Decision Making  Medically screening exam initiated at 3:50 PM.  Appropriate orders placed.  Dorothy [name] was informed that the remainder of the evaluation will be completed by another provider, this initial triage assessment does not replace that evaluation, and the importance of remaining in the ED until their evaluation is complete.  Workup initiated, patient stable at this time   Dorothy [name] 03/16/23 1551

## 2023-03-16 NOTE — ED Notes (Signed)
Gave pt a sandwich bag  

## 2023-03-16 NOTE — ED Triage Notes (Signed)
Pt via POV due to HTN. Pt reports BP was 223 systolic and she was advised by her PCP to come to ER for eval. Most recent home BP 227/104, HR 66. Pt denies pain, headache, CP, SOB, blurry vision, dizziness and states she feels normal.   Vitals in triage: BP 221/100 (133) HR 68

## 2023-03-16 NOTE — ED Notes (Addendum)
Pt ambulated independently to the restroom, when Pt returned to treatment roomed and was placed on Pulse Ox, Pt noted to have 80% O2 Sats on room air and remained hypertensive. Pt has audible wheezing. EDP Notified and RT called to bedside for assessment. Pt placed on 2L Nasal Cannula and O2 Sats improved to 92-95%.

## 2023-03-17 ENCOUNTER — Observation Stay (HOSPITAL_COMMUNITY): Payer: Medicare HMO

## 2023-03-17 DIAGNOSIS — M810 Age-related osteoporosis without current pathological fracture: Secondary | ICD-10-CM | POA: Diagnosis present

## 2023-03-17 DIAGNOSIS — J449 Chronic obstructive pulmonary disease, unspecified: Secondary | ICD-10-CM | POA: Insufficient documentation

## 2023-03-17 DIAGNOSIS — J439 Emphysema, unspecified: Secondary | ICD-10-CM | POA: Diagnosis present

## 2023-03-17 DIAGNOSIS — E7800 Pure hypercholesterolemia, unspecified: Secondary | ICD-10-CM | POA: Diagnosis present

## 2023-03-17 DIAGNOSIS — R0602 Shortness of breath: Secondary | ICD-10-CM | POA: Diagnosis present

## 2023-03-17 DIAGNOSIS — F17210 Nicotine dependence, cigarettes, uncomplicated: Secondary | ICD-10-CM | POA: Diagnosis present

## 2023-03-17 DIAGNOSIS — Z882 Allergy status to sulfonamides status: Secondary | ICD-10-CM | POA: Diagnosis not present

## 2023-03-17 DIAGNOSIS — F32A Depression, unspecified: Secondary | ICD-10-CM | POA: Diagnosis present

## 2023-03-17 DIAGNOSIS — Z86010 Personal history of colonic polyps: Secondary | ICD-10-CM | POA: Diagnosis not present

## 2023-03-17 DIAGNOSIS — J441 Chronic obstructive pulmonary disease with (acute) exacerbation: Secondary | ICD-10-CM | POA: Diagnosis present

## 2023-03-17 DIAGNOSIS — F99 Mental disorder, not otherwise specified: Secondary | ICD-10-CM | POA: Diagnosis present

## 2023-03-17 DIAGNOSIS — I169 Hypertensive crisis, unspecified: Secondary | ICD-10-CM | POA: Diagnosis present

## 2023-03-17 DIAGNOSIS — J9601 Acute respiratory failure with hypoxia: Secondary | ICD-10-CM

## 2023-03-17 DIAGNOSIS — E039 Hypothyroidism, unspecified: Secondary | ICD-10-CM | POA: Diagnosis present

## 2023-03-17 DIAGNOSIS — Z7982 Long term (current) use of aspirin: Secondary | ICD-10-CM | POA: Diagnosis not present

## 2023-03-17 DIAGNOSIS — Z8541 Personal history of malignant neoplasm of cervix uteri: Secondary | ICD-10-CM | POA: Diagnosis not present

## 2023-03-17 DIAGNOSIS — Z803 Family history of malignant neoplasm of breast: Secondary | ICD-10-CM | POA: Diagnosis not present

## 2023-03-17 DIAGNOSIS — Z90710 Acquired absence of both cervix and uterus: Secondary | ICD-10-CM | POA: Diagnosis not present

## 2023-03-17 DIAGNOSIS — E878 Other disorders of electrolyte and fluid balance, not elsewhere classified: Secondary | ICD-10-CM | POA: Diagnosis present

## 2023-03-17 DIAGNOSIS — Z96643 Presence of artificial hip joint, bilateral: Secondary | ICD-10-CM | POA: Diagnosis present

## 2023-03-17 DIAGNOSIS — Z79899 Other long term (current) drug therapy: Secondary | ICD-10-CM | POA: Diagnosis not present

## 2023-03-17 DIAGNOSIS — Z8041 Family history of malignant neoplasm of ovary: Secondary | ICD-10-CM | POA: Diagnosis not present

## 2023-03-17 DIAGNOSIS — I10 Essential (primary) hypertension: Secondary | ICD-10-CM | POA: Diagnosis present

## 2023-03-17 DIAGNOSIS — Z8249 Family history of ischemic heart disease and other diseases of the circulatory system: Secondary | ICD-10-CM | POA: Diagnosis not present

## 2023-03-17 DIAGNOSIS — E871 Hypo-osmolality and hyponatremia: Secondary | ICD-10-CM | POA: Diagnosis not present

## 2023-03-17 DIAGNOSIS — Z7951 Long term (current) use of inhaled steroids: Secondary | ICD-10-CM | POA: Diagnosis not present

## 2023-03-17 LAB — HIV ANTIBODY (ROUTINE TESTING W REFLEX): HIV Screen 4th Generation wRfx: NONREACTIVE

## 2023-03-17 LAB — BASIC METABOLIC PANEL
Anion gap: 9 (ref 5–15)
Anion gap: 11 (ref 5–15)
Anion gap: 11 (ref 5–15)
BUN: 10 mg/dL (ref 8–23)
BUN: 10 mg/dL (ref 8–23)
BUN: 10 mg/dL (ref 8–23)
CO2: 25 mmol/L (ref 22–32)
CO2: 24 mmol/L (ref 22–32)
CO2: 23 mmol/L (ref 22–32)
Calcium: 8.1 mg/dL — ABNORMAL LOW (ref 8.9–10.3)
Calcium: 8 mg/dL — ABNORMAL LOW (ref 8.9–10.3)
Calcium: 8.1 mg/dL — ABNORMAL LOW (ref 8.9–10.3)
Chloride: 88 mmol/L — ABNORMAL LOW (ref 98–111)
Chloride: 87 mmol/L — ABNORMAL LOW (ref 98–111)
Chloride: 88 mmol/L — ABNORMAL LOW (ref 98–111)
Creatinine, Ser: 0.83 mg/dL (ref 0.44–1.00)
Creatinine, Ser: 0.92 mg/dL (ref 0.44–1.00)
Creatinine, Ser: 0.94 mg/dL (ref 0.44–1.00)
GFR, Estimated: >60 mL/min (ref >60)
GFR, Estimated: >60 mL/min (ref >60)
GFR, Estimated: >60 mL/min (ref >60)
Glucose, Bld: 162 mg/dL — ABNORMAL HIGH (ref 70–99)
Glucose, Bld: 221 mg/dL — ABNORMAL HIGH (ref 70–99)
Glucose, Bld: 205 mg/dL — ABNORMAL HIGH (ref 70–99)
Potassium: 3.4 mmol/L — ABNORMAL LOW (ref 3.5–5.1)
Potassium: 3.2 mmol/L — ABNORMAL LOW (ref 3.5–5.1)
Potassium: 3.5 mmol/L (ref 3.5–5.1)
Sodium: 122 mmol/L — ABNORMAL LOW (ref 135–145)
Sodium: 122 mmol/L — ABNORMAL LOW (ref 135–145)
Sodium: 122 mmol/L — ABNORMAL LOW (ref 135–145)

## 2023-03-17 LAB — COMPREHENSIVE METABOLIC PANEL
ALT: 18 U/L (ref 0–44)
AST: 18 U/L (ref 15–41)
Albumin: 3.5 g/dL (ref 3.5–5.0)
Alkaline Phosphatase: 71 U/L (ref 38–126)
Anion gap: 8 (ref 5–15)
BUN: 11 mg/dL (ref 8–23)
CO2: 26 mmol/L (ref 22–32)
Calcium: 8.1 mg/dL — ABNORMAL LOW (ref 8.9–10.3)
Chloride: 89 mmol/L — ABNORMAL LOW (ref 98–111)
Creatinine, Ser: 0.74 mg/dL (ref 0.44–1.00)
GFR, Estimated: >60 mL/min (ref >60)
Glucose, Bld: 171 mg/dL — ABNORMAL HIGH (ref 70–99)
Potassium: 3.6 mmol/L (ref 3.5–5.1)
Sodium: 123 mmol/L — ABNORMAL LOW (ref 135–145)
Total Bilirubin: 0.4 mg/dL (ref 0.3–1.2)
Total Protein: 6.4 g/dL — ABNORMAL LOW (ref 6.5–8.1)

## 2023-03-17 LAB — GLUCOSE, CAPILLARY
Glucose-Capillary: 138 mg/dL — ABNORMAL HIGH (ref 70–99)
Glucose-Capillary: 161 mg/dL — ABNORMAL HIGH (ref 70–99)
Glucose-Capillary: 232 mg/dL — ABNORMAL HIGH (ref 70–99)
Glucose-Capillary: 166 mg/dL — ABNORMAL HIGH (ref 70–99)

## 2023-03-17 LAB — CBC WITH DIFFERENTIAL/PLATELET
Abs Immature Granulocytes: 0.02 10*3/uL (ref 0.00–0.07)
Basophils Absolute: 0 10*3/uL (ref 0.0–0.1)
Basophils Relative: 0 %
Eosinophils Absolute: 0 10*3/uL (ref 0.0–0.5)
Eosinophils Relative: 0 %
HCT: 40.5 % (ref 36.0–46.0)
Hemoglobin: 14.6 g/dL (ref 12.0–15.0)
Immature Granulocytes: 0 %
Lymphocytes Relative: 3 %
Lymphs Abs: 0.2 10*3/uL — ABNORMAL LOW (ref 0.7–4.0)
MCH: 28.3 pg (ref 26.0–34.0)
MCHC: 36 g/dL (ref 30.0–36.0)
MCV: 78.5 fL — ABNORMAL LOW (ref 80.0–100.0)
Monocytes Absolute: 0.1 10*3/uL (ref 0.1–1.0)
Monocytes Relative: 2 %
Neutro Abs: 7.4 10*3/uL (ref 1.7–7.7)
Neutrophils Relative %: 95 %
Platelets: 270 10*3/uL (ref 150–400)
RBC: 5.16 MIL/uL — ABNORMAL HIGH (ref 3.87–5.11)
RDW: 13.6 % (ref 11.5–15.5)
WBC: 7.8 10*3/uL (ref 4.0–10.5)
nRBC: 0 % (ref 0.0–0.2)

## 2023-03-17 LAB — T4, FREE: Free T4: 1.38 ng/dL — ABNORMAL HIGH (ref 0.61–1.12)

## 2023-03-17 LAB — OSMOLALITY: Osmolality: 267 mOsm/kg — ABNORMAL LOW (ref 275–295)

## 2023-03-17 LAB — OSMOLALITY, URINE: Osmolality, Ur: 70 mOsm/kg — ABNORMAL LOW (ref 300–900)

## 2023-03-17 LAB — NA AND K (SODIUM & POTASSIUM), RAND UR
Potassium Urine: 5 mmol/L
Sodium, Ur: 21 mmol/L

## 2023-03-17 LAB — MAGNESIUM: Magnesium: 2 mg/dL (ref 1.7–2.4)

## 2023-03-17 LAB — BRAIN NATRIURETIC PEPTIDE: B Natriuretic Peptide: 32 pg/mL (ref 0.0–100.0)

## 2023-03-17 LAB — SODIUM: Sodium: 125 mmol/L — ABNORMAL LOW (ref 135–145)

## 2023-03-17 MED ORDER — SODIUM CHLORIDE 1 G PO TABS
1.0000 g | ORAL_TABLET | Freq: Two times a day (BID) | ORAL | Status: DC
  Administered 2023-03-17 – 2023-03-19 (×4): 1 g via ORAL
  Filled 2023-03-17 (×4): qty 1

## 2023-03-17 MED ORDER — IPRATROPIUM-ALBUTEROL 0.5-2.5 (3) MG/3ML IN SOLN
3.0000 mL | RESPIRATORY_TRACT | Status: AC
  Administered 2023-03-17: 3 mL via RESPIRATORY_TRACT
  Filled 2023-03-17: qty 3

## 2023-03-17 MED ORDER — MOMETASONE FURO-FORMOTEROL FUM 200-5 MCG/ACT IN AERO
2.0000 | INHALATION_SPRAY | Freq: Two times a day (BID) | RESPIRATORY_TRACT | Status: DC
  Administered 2023-03-17 – 2023-03-19 (×5): 2 via RESPIRATORY_TRACT
  Filled 2023-03-17: qty 8.8

## 2023-03-17 MED ORDER — IPRATROPIUM-ALBUTEROL 0.5-2.5 (3) MG/3ML IN SOLN
3.0000 mL | Freq: Four times a day (QID) | RESPIRATORY_TRACT | Status: DC
  Administered 2023-03-17: 3 mL via RESPIRATORY_TRACT
  Filled 2023-03-17: qty 3

## 2023-03-17 MED ORDER — OXYCODONE HCL 5 MG PO TABS: 5.0000 mg | ORAL_TABLET | ORAL | Status: DC | PRN

## 2023-03-17 MED ORDER — MORPHINE SULFATE (PF) 2 MG/ML IV SOLN: 2.0000 mg | INTRAVENOUS | Status: DC | PRN

## 2023-03-17 MED ORDER — LISINOPRIL 10 MG PO TABS
40.0000 mg | ORAL_TABLET | Freq: Every day | ORAL | Status: DC
  Administered 2023-03-17 – 2023-03-19 (×3): 40 mg via ORAL
  Filled 2023-03-17 (×3): qty 4

## 2023-03-17 MED ORDER — METHYLPREDNISOLONE SODIUM SUCC 125 MG IJ SOLR
125.0000 mg | Freq: Every day | INTRAMUSCULAR | Status: DC
  Administered 2023-03-17: 125 mg via INTRAVENOUS
  Filled 2023-03-17 (×2): qty 2

## 2023-03-17 MED ORDER — ALBUTEROL SULFATE (2.5 MG/3ML) 0.083% IN NEBU
2.5000 mg | INHALATION_SOLUTION | RESPIRATORY_TRACT | Status: DC | PRN
  Administered 2023-03-17: 2.5 mg via RESPIRATORY_TRACT
  Filled 2023-03-17: qty 3

## 2023-03-17 MED ORDER — NICOTINE 21 MG/24HR TD PT24
21.0000 mg | MEDICATED_PATCH | Freq: Every day | TRANSDERMAL | Status: DC
  Administered 2023-03-17 – 2023-03-19 (×3): 21 mg via TRANSDERMAL
  Filled 2023-03-17 (×3): qty 1

## 2023-03-17 MED ORDER — IPRATROPIUM-ALBUTEROL 0.5-2.5 (3) MG/3ML IN SOLN
3.0000 mL | Freq: Three times a day (TID) | RESPIRATORY_TRACT | Status: DC
  Administered 2023-03-17 – 2023-03-19 (×7): 3 mL via RESPIRATORY_TRACT
  Filled 2023-03-17 (×7): qty 3

## 2023-03-17 MED ORDER — IOHEXOL 350 MG/ML SOLN
75.0000 mL | Freq: Once | INTRAVENOUS | Status: AC | PRN
  Administered 2023-03-17: 75 mL via INTRAVENOUS

## 2023-03-17 MED ORDER — MONTELUKAST SODIUM 10 MG PO TABS
10.0000 mg | ORAL_TABLET | Freq: Every day | ORAL | Status: DC
  Administered 2023-03-17 – 2023-03-18 (×2): 10 mg via ORAL
  Filled 2023-03-17 (×2): qty 1

## 2023-03-17 MED ORDER — LEVOTHYROXINE SODIUM 25 MCG PO TABS
125.0000 ug | ORAL_TABLET | Freq: Every day | ORAL | Status: DC
  Administered 2023-03-17 – 2023-03-19 (×3): 125 ug via ORAL
  Filled 2023-03-17 (×3): qty 1

## 2023-03-17 MED ORDER — ATORVASTATIN CALCIUM 40 MG PO TABS
40.0000 mg | ORAL_TABLET | Freq: Every day | ORAL | Status: DC
  Administered 2023-03-17 – 2023-03-19 (×3): 40 mg via ORAL
  Filled 2023-03-17 (×3): qty 1

## 2023-03-17 MED ORDER — ACETAMINOPHEN 325 MG PO TABS: 650.0000 mg | ORAL_TABLET | Freq: Four times a day (QID) | ORAL | Status: DC | PRN

## 2023-03-17 MED ORDER — ARIPIPRAZOLE 10 MG PO TABS
10.0000 mg | ORAL_TABLET | Freq: Every day | ORAL | Status: DC
  Administered 2023-03-17 – 2023-03-19 (×3): 10 mg via ORAL
  Filled 2023-03-17 (×3): qty 1

## 2023-03-17 MED ORDER — POTASSIUM CHLORIDE CRYS ER 20 MEQ PO TBCR
40.0000 meq | EXTENDED_RELEASE_TABLET | Freq: Once | ORAL | Status: AC
  Administered 2023-03-17: 40 meq via ORAL
  Filled 2023-03-17: qty 2

## 2023-03-17 MED ORDER — PREDNISONE 20 MG PO TABS: 40.0000 mg | ORAL_TABLET | Freq: Every day | ORAL | Status: DC

## 2023-03-17 MED ORDER — ONDANSETRON HCL 4 MG/2ML IJ SOLN: 4.0000 mg | Freq: Four times a day (QID) | INTRAMUSCULAR | Status: DC | PRN

## 2023-03-17 MED ORDER — ONDANSETRON HCL 4 MG PO TABS: 4.0000 mg | ORAL_TABLET | Freq: Four times a day (QID) | ORAL | Status: DC | PRN

## 2023-03-17 MED ORDER — CLONAZEPAM 0.5 MG PO TABS
0.5000 mg | ORAL_TABLET | Freq: Every day | ORAL | Status: DC
  Administered 2023-03-17 – 2023-03-18 (×2): 0.5 mg via ORAL
  Filled 2023-03-17 (×2): qty 1

## 2023-03-17 MED ORDER — ACETAMINOPHEN 650 MG RE SUPP: 650.0000 mg | Freq: Four times a day (QID) | RECTAL | Status: DC | PRN

## 2023-03-17 MED ORDER — HYDRALAZINE HCL 25 MG PO TABS
25.0000 mg | ORAL_TABLET | Freq: Three times a day (TID) | ORAL | Status: DC
  Administered 2023-03-17 – 2023-03-18 (×4): 25 mg via ORAL
  Filled 2023-03-17 (×4): qty 1

## 2023-03-17 MED ORDER — HEPARIN SODIUM (PORCINE) 5000 UNIT/ML IJ SOLN
5000.0000 [IU] | Freq: Three times a day (TID) | INTRAMUSCULAR | Status: DC
  Administered 2023-03-17 – 2023-03-19 (×7): 5000 [IU] via SUBCUTANEOUS
  Filled 2023-03-17 (×7): qty 1

## 2023-03-17 MED ORDER — PANTOPRAZOLE SODIUM 40 MG PO TBEC: 40.0000 mg | DELAYED_RELEASE_TABLET | Freq: Every day | ORAL | Status: DC

## 2023-03-17 MED ORDER — MAGNESIUM SULFATE 2 GM/50ML IV SOLN
2.0000 g | Freq: Once | INTRAVENOUS | Status: AC
  Administered 2023-03-17: 2 g via INTRAVENOUS
  Filled 2023-03-17: qty 50

## 2023-03-17 MED ORDER — SODIUM CHLORIDE 0.9 % IV SOLN: INTRAVENOUS | Status: DC

## 2023-03-17 MED ORDER — SODIUM CHLORIDE 1 G PO TABS
1.0000 g | ORAL_TABLET | Freq: Two times a day (BID) | ORAL | Status: DC
  Administered 2023-03-17: 1 g via ORAL
  Filled 2023-03-17: qty 1

## 2023-03-17 MED ORDER — IPRATROPIUM-ALBUTEROL 0.5-2.5 (3) MG/3ML IN SOLN: 3.0000 mL | Freq: Four times a day (QID) | RESPIRATORY_TRACT | Status: DC

## 2023-03-17 MED ORDER — MELATONIN 3 MG PO TABS
9.0000 mg | ORAL_TABLET | Freq: Every day | ORAL | Status: DC
  Administered 2023-03-17 – 2023-03-18 (×2): 9 mg via ORAL
  Filled 2023-03-17 (×2): qty 3

## 2023-03-17 MED ORDER — ASPIRIN 81 MG PO TBEC
81.0000 mg | DELAYED_RELEASE_TABLET | Freq: Every day | ORAL | Status: DC
  Administered 2023-03-17 – 2023-03-19 (×3): 81 mg via ORAL
  Filled 2023-03-17 (×3): qty 1

## 2023-03-17 NOTE — Assessment & Plan Note (Addendum)
-   Continue Synthroid at increased dose - Increased dose from 100>> 125 mcg due to elevated TSH -5.49

## 2023-03-17 NOTE — Assessment & Plan Note (Addendum)
-   Blood pressure got as high as 238/113 -Blood pressure improving this morning 151/63 - Given her hyponatremia we will be holding hydrochlorothiazide - Bumping up lisinopril to 40 mg daily -Added and increasing hydralazine to 50 mg p.o. 3 times daily - Continue to monitor

## 2023-03-17 NOTE — Assessment & Plan Note (Addendum)
-   Much improved shortness of breath now shortness of breath with exertion -Off supplemental oxygen, on room air satting 94%  - POA: Patient dropped down to 80% on 2 L nasal cannula with ambulation - EKG shows heart rate 66, sinus rhythm, QTc 422 with no acute ST changes - Chest x-ray shows emphysema and chronic bronchial thickening with no evidence of pulmonary mass or nodule - Troponin was normal at 3 - Pulmonary hypertension rule out by x-ray as BP improved  -History of COPD, likely COPD exacerbation  - Will continue with DuoNeb breathing treatments, IV steroids,  - Continue DuoNeb every 6 hours scheduled  - Monitor on telemetry  -CTA-emphysematous changes, benign nodule, otherwise within normal limits negative for pulmonary embolism

## 2023-03-17 NOTE — Progress Notes (Signed)
Patients tele order expired. MD Shahmehdi made aware. No new orders.

## 2023-03-17 NOTE — Progress Notes (Signed)
PROGRESS NOTE    Patient: Dorothy [name]                            PCP: Erasmo Downer, NP                    DOB: 1956-03-26            DOA: 03/16/2023 WUJ:[id number]             DOS: 03/17/2023, 9:02 AM   LOS: 0 days   Date of Service: The patient was seen and examined on 03/17/2023  Subjective:   The patient was seen and examined this morning. Hemodynamically stable. No issues overnight .  Brief Narrative:   Dorothy [name] is a 67 y.o. female with medical history significant of anxiety, depression, GERD, hyperlipidemia, hypertension, hypothyroidism, COPD, and more presents the ED with a chief complaint of elevated blood pressure.  Patient reports that she was seeing an outpatient doctor when it was noted that her blood pressure was over 200.  They told her to monitor it when she got home.  It continued to be over 200 at home, so she called a different outpatient doctor who's nurse advised her to come into the ED.  Patient reports that she was completely asymptomatic.  She reports no chest pain, palpitations, dyspnea, headaches.   Patient is on multiple psych medications but reports that none of them are new.  Her hydrochlorothiazide is not a new medication either.  Patient reports she does not normally monitor her blood pressure at home so she does not know what it is normally running.  She denies any symptoms of hyponatremia including dizziness, generalized weakness, fatigue, nausea, vomiting.  She has no other complaints at this time.   Patient smokes and would like a nicotine patch while she is here.  She reports she is ready to quit she is tested Tuesday.  She does not drink alcohol, does not use illicit drugs.  She is vaccinated for COVID.  Patient reports she is full code.  ED Temp 97.7, heart rate 63-74, respiratory rate 13-23, blood pressure 144/82-238/113, satting 80-95% on 2 L nasal cannula No leukocytosis, hemoglobin stable Chemistries unremarkable aside from a  hyponatremia and hypochloremia Osmolality 254 TSH is slightly elevated at 5.495 UA is not indicative of UTI Chest x-ray shows emphysema and chronic bronchial wall thickening EKG shows a heart rate of 66, sinus rhythm, QTc 422 Hydralazine 25 mg and 50 mg given p.o. in the ED Lisinopril 20 mg given in the ED Patient was given 1 L normal saline and Solu-Medrol 125 mg Admission was requested for hypertensive crisis    Assessment & Plan:   Principal Problem:   Hypertensive crisis Active Problems:   Hypothyroidism (acquired)   Hyponatremia   Acute respiratory failure with hypoxia (HCC)   Psychiatric disorder   COPD (chronic obstructive pulmonary disease) (HCC)     Assessment and Plan: * Hypertensive crisis - Blood pressure got as high as 238/113 -Blood pressure has much improved 121/66 - Patient was given multiple doses of p.o. antihypertensives blood pressure briefly dropped as low as 84/61 - Given her hyponatremia we will be holding hydrochlorothiazide - Bumping up lisinopril to 40 mg daily -Added hydralazine 25 mg p.o. 3 times daily - Continue to monitor  COPD (chronic obstructive pulmonary disease) (HCC) - Hypoxia with ambulation - No wheeze on exam but patient is diminished in the lower lung  fields - Continue home Advair and montelukast - - Continue DuoNeb every 6 - Monitor clinical response  Psychiatric disorder - Continue Abilify, holding venlafaxine given hyponatremia  Acute respiratory failure with hypoxia (HCC) Still having some mild shortness of breath, currently stable, on 2 L of oxygen, satting 95% Denies of having any chest pain  - POA: Patient dropped down to 80% on 2 L nasal cannula with ambulation - EKG shows heart rate 66, sinus rhythm, QTc 422 with no acute ST changes - Chest x-ray shows emphysema and chronic bronchial thickening with no evidence of pulmonary mass or nodule - Troponin was normal at 3 - Patient did have hypertensive crisis so there  was concern for flash pulmonary edema, but that was not seen on chest x-ray obviously as listed above - Patient does have a history of COPD and that is likely contributing to her hypoxia - Will continue with DuoNeb breathing treatments, IV steroids,  - Continue DuoNeb every 6 hours scheduled - Wean off O2 as tolerated - Monitor on telemetry  -Will obtain to CTA-due to acute hypoxia, hyponatremia, with history of chronic tobacco abuse  Hyponatremia - Sodium down to 119 >>> 125 - Every 6 hours BMP was ordered, but for some reason it has been 10 hours since last BMP - Updated BMP currently pending - Sodium previously 133 and 125 last year and then 119 today - Asymptomatic - Gentle IV hydration with normal saline -Pursuing SIADH workup (serum/urine osmolality, serum/urine sodium) - Start sodium tabs - Holding venlafaxine and hydrochlorothiazide - Continue to monitor  Hypothyroidism (acquired) - Continue Synthroid at increased dose - Increased dose from 100>> 125 mcg due to elevated TSH -5.49     --------------------------------------------------------------------------------------------------------------------------- Nutritional status:  The patient's BMI is: Body mass index is 19.75 kg/m. I agree with the assessment and plan as outlined -------------------------------------------------------------------------------------------------------------------------  DVT prophylaxis:  heparin injection 5,000 Units Start: 03/17/23 0600 SCDs Start: 03/17/23 0157   Code Status:   Code Status: Full Code  Family Communication: No family member present at bedside- attempt will be made to update daily The above findings and plan of care has been discussed with patient (and family)  in detail,  they expressed understanding and agreement of above. -Advance care planning has been discussed.   Admission status:   Status is: Observation The patient remains OBS appropriate and will d/c before  2 midnights.   Disposition: From  - home             Planning for discharge in 1-2 days: to   Procedures:   No admission procedures for hospital encounter.   Antimicrobials:  Anti-infectives (From admission, onward)    None        Medication:   ARIPiprazole  10 mg Oral Daily   aspirin EC  81 mg Oral Daily   atorvastatin  40 mg Oral Daily   clonazePAM  0.5 mg Oral QHS   heparin  5,000 Units Subcutaneous Q8H   hydrALAZINE  25 mg Oral Q8H   ipratropium-albuterol  3 mL Nebulization TID   levothyroxine  125 mcg Oral Q0600   lisinopril  40 mg Oral Daily   melatonin  9 mg Oral QHS   methylPREDNISolone (SOLU-MEDROL) injection  125 mg Intravenous Daily   mometasone-formoterol  2 puff Inhalation BID   montelukast  10 mg Oral QHS   nicotine  21 mg Transdermal Daily   sodium chloride  1 g Oral BID WC    acetaminophen **OR** acetaminophen, albuterol, morphine  injection, ondansetron **OR** ondansetron (ZOFRAN) IV, oxyCODONE   Objective:   Vitals:   03/17/23 0454 03/17/23 0700 03/17/23 0735 03/17/23 0835  BP: 133/83   121/66  Pulse: 72   70  Resp: 18   18  Temp: 98.7 F (37.1 C)   97.6 F (36.4 C)  TempSrc:    Oral  SpO2: 94% 92% 92% 95%  Weight:      Height:        Intake/Output Summary (Last 24 hours) at 03/17/2023 0902 Last data filed at 03/17/2023 1610 Gross per 24 hour  Intake 517.33 ml  Output --  Net 517.33 ml   Filed Weights   03/16/23 1423 03/17/23 0200  Weight: 59 kg 57.2 kg     Physical examination:   Constitution:  Alert, cooperative, no distress,  Appears calm and comfortable  Psychiatric:   Normal and stable mood and affect, cognition intact,   HEENT:        Normocephalic, PERRL, otherwise with in Normal limits  Chest:         Chest symmetric Cardio vascular:  S1/S2, RRR, No murmure, No Rubs or Gallops  pulmonary: Clear to auscultation bilaterally, respirations unlabored, negative wheezes / crackles Abdomen: Soft, non-tender, non-distended,  bowel sounds,no masses, no organomegaly Muscular skeletal: Limited exam - in bed, able to move all 4 extremities,   Neuro: CNII-XII intact. , normal motor and sensation, reflexes intact  Extremities: No pitting edema lower extremities, +2 pulses  Skin: Dry, warm to touch, negative for any Rashes, No open wounds Wounds: per nursing documentation   ------------------------------------------------------------------------------------------------------------------------------------------    LABs:     Latest Ref Rng & Units 03/17/2023    4:34 AM 03/16/2023    4:09 PM 11/10/2021    5:57 AM  CBC  WBC 4.0 - 10.5 K/uL 7.8  8.8  10.6   Hemoglobin 12.0 - 15.0 g/dL 96.0  45.4  09.8   Hematocrit 36.0 - 46.0 % 40.5  45.6  40.0   Platelets 150 - 400 K/uL 270  297  186       Latest Ref Rng & Units 03/17/2023    7:54 AM 03/17/2023    4:34 AM 03/16/2023    4:09 PM  CMP  Glucose 70 - 99 mg/dL  119  93   BUN 8 - 23 mg/dL  11  8   Creatinine 1.47 - 1.00 mg/dL  8.29  5.62   Sodium 130 - 145 mmol/L 125  123  119   Potassium 3.5 - 5.1 mmol/L  3.6  3.6   Chloride 98 - 111 mmol/L  89  79   CO2 22 - 32 mmol/L  26  27   Calcium 8.9 - 10.3 mg/dL  8.1  9.2   Total Protein 6.5 - 8.1 g/dL  6.4    Total Bilirubin 0.3 - 1.2 mg/dL  0.4    Alkaline Phos 38 - 126 U/L  71    AST 15 - 41 U/L  18    ALT 0 - 44 U/L  18         Micro Results No results found for this or any previous visit (from the past 240 hour(s)).  Radiology Reports DG Chest 2 View  Result Date: 03/16/2023 CLINICAL DATA:  Wheezing. Hyponatremia. Rule out mass. EXAM: CHEST - 2 VIEW COMPARISON:  Chest radiograph 11/09/2021. CT 12/06/2021 FINDINGS: Emphysema with mild hyperinflation. Chronic bronchial thickening. The heart is normal in size. Aortic atherosclerosis. No evidence of pulmonary mass or  nodule by radiograph. No focal airspace disease. No pleural effusion or pneumothorax. Normal pulmonary vasculature. IMPRESSION: 1. Emphysema and  chronic bronchial thickening. 2. No evidence of pulmonary mass or nodule. Electronically Signed   By: Narda Rutherford M.D.   On: 03/16/2023 19:06    SIGNED: Kendell Bane, MD, FHM. FAAFP. [facility] - [facility] Time spent - 35 min.  In seeing, evaluating and examining the patient. Reviewing medical records, labs, drawn plan of care. [facility],  Pager (please use [email or web address] to page/ text) Please use Epic Secure Chat for non-urgent communication (7AM-7PM)  If 7PM-7AM, please contact night-coverage [email or web address], 03/17/2023, 9:02 AM

## 2023-03-17 NOTE — Assessment & Plan Note (Signed)
-   Continue Abilify, holding venlafaxine given hyponatremia

## 2023-03-17 NOTE — H&P (Signed)
History and Physical    Patient: Dorothy [name] ZOX:[id number] DOB: [birth year] DOA: 03/16/2023 DOS: the patient was seen and examined on 03/17/2023 PCP: Erasmo Downer, NP  Patient coming from: Home  Chief Complaint:  Chief Complaint  Patient presents with   Hypertension   HPI: Dorothy [name] is a 67 y.o. female with medical history significant of anxiety, depression, GERD, hyperlipidemia, hypertension, hypothyroidism, COPD, and more presents the ED with a chief complaint of elevated blood pressure.  Patient reports that she was seeing an outpatient doctor when it was noted that her blood pressure was over 200.  They told her to monitor it when she got home.  It continued to be over 200 at home, so she called a different outpatient doctor who's nurse advised her to come into the ED.  Patient reports that she was completely asymptomatic.  She reports no chest pain, palpitations, dyspnea, headaches.  She does report that she has had a decreased appetite.  Patient is on multiple psych medications but reports that none of them are new.  Her hydrochlorothiazide is not a new medication either.  Patient reports she does not normally monitor her blood pressure at home so she does not know what it is normally running.  She denies any symptoms of hyponatremia including dizziness, generalized weakness, fatigue, nausea, vomiting.  She has no other complaints at this time.  Patient smokes and would like a nicotine patch while she is here.  She reports she is ready to quit she is tested Tuesday.  She does not drink alcohol, does not use illicit drugs.  She is vaccinated for COVID.  Patient reports she is full code. Review of Systems: As mentioned in the history of present illness. All other systems reviewed and are negative. 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   Benign essential HTN 01/17/2012    Cancer (HCC) 1982   ovarian   Cancer of cervix (HCC)    Constipation    Depression    Takes Klonopin   Depression with anxiety 01/17/2012   GERD (gastroesophageal reflux disease)    Hypercholesteremia    Hypertension    takes meds   Hypothyroidism    on Levonthyroxine   Hypothyroidism (acquired) 01/17/2012   Since age 57   Low sodium levels 09/25/2022   Na level 125 at [facility]   Osteopenia 2018   forearm   Osteoporosis 07/25/2021   forearm   Ovarian cyst 01/17/2012   1995  BSO   Personal history of colonic polyps 11/07/2007   Pneumonia    x 2- last time > 5 years ago   Pneumonia 01/17/2012   10 yrs ago and 5 years ago    Rash and other nonspecific skin eruption 01/17/2012   Dorsum hands & extensor surfaces arms; intermittent   Past Surgical History:  Procedure Laterality Date   ABDOMINAL HYSTERECTOMY  1982   ovaries retained--for CIS   CELIAC ARTERY BYPASS  09/25/2012   Procedure: CELIAC ARTERY BYPASS;  Surgeon: Sherren Kerns, MD;  Location: [facility] OR;  Service: Vascular;  Laterality: N/A;  Bypass using Hemashield Gold Vascular Graft 12mm x 6mm x 40cm   COLONOSCOPY  2009   [place]-polyps, 5 yr FU   JOINT REPLACEMENT     MESENTERIC ARTERY BYPASS  09/25/2012   Procedure: MESENTERIC ARTERY BYPASS;  Surgeon: Leonette Most  Holland Commons, MD;  Location: [facility] OR;  Service: Vascular;  Laterality: N/A;  Bypass using Hemashield Gold Vascular Graft 12mm x 6mm x 40cm   OVARIAN CYST REMOVAL  1997   -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   TOTAL HIP ARTHROPLASTY  09/06/2011   Procedure: TOTAL HIP ARTHROPLASTY;  Surgeon: Loreta Ave, MD;  Location: [facility] OR;  Service: Orthopedics;  Laterality: Left;  TOTAL HIP ARTHROPLASTY LEFT SIDE   TOTAL HIP ARTHROPLASTY  10/11/2011   Procedure: TOTAL HIP ARTHROPLASTY;  Surgeon: Loreta Ave, MD;  Location: [facility] OR;  Service: Orthopedics;  Laterality: Right;  120 MINUTES FOR THIS SURGERY   WOUND EXPLORATION  10/02/2012    Procedure: WOUND EXPLORATION;  Surgeon: Sherren Kerns, MD;  Location: [facility] OR;  Service: Vascular;  Laterality: N/A;  abdominal wound exploration   Social History:  reports that she has been smoking cigarettes. She has a 40.00 pack-year smoking history. She has never used smokeless tobacco. She reports that she does not drink alcohol and does not use drugs.  Allergies  Allergen Reactions   Sulfa Antibiotics Rash    Family History  Problem Relation Age of Onset   Cancer Mother        lung   Heart failure Father    Breast cancer Sister 87       dec age 53   Cancer Sister    Heart attack Brother 81   Heart attack Brother    Hypertension Son    Ovarian cancer Paternal Grandmother        dec age 65   Anesthesia problems Neg Hx    Hypotension Neg Hx    Malignant hyperthermia Neg Hx    Pseudochol deficiency Neg Hx    Colon cancer Neg Hx    Esophageal cancer Neg Hx    Rectal cancer Neg Hx    Stomach cancer Neg Hx     Prior to Admission medications   Medication Sig Start Date End Date Taking? Authorizing Provider  ADVAIR DISKUS 250-50 MCG/ACT AEPB Inhale 1 puff into the lungs in the morning and at bedtime. 12/02/21  Yes [provider]  albuterol (VENTOLIN HFA) 108 (90 Base) MCG/ACT inhaler Inhale 2 puffs into the lungs See admin instructions. Inhale 2 puffs every 4 to 6 hours as needed for shortness of breath   Yes [provider]  alendronate (FOSAMAX) 70 MG tablet Take 70 mg by mouth once a week.   Yes [provider]  ARIPiprazole (ABILIFY) 10 MG tablet Take 10 mg by mouth daily. Takes 1/2 daily 02/16/17  Yes [provider]  aspirin EC 81 MG tablet Take 81 mg by mouth daily. Swallow whole.   Yes [provider]  atorvastatin (LIPITOR) 40 MG tablet Take 40 mg by mouth daily. 06/19/22  Yes [provider]  azelastine (ASTELIN) 0.1 % nasal spray Place 2 sprays into both nostrils 2 (two) times daily. 02/07/23  Yes [provider]  CALCIUM PO Take 1 tablet by mouth daily.   Yes [provider]  Cholecalciferol 50 MCG (2000 UT) TABS Take 1 tablet by mouth daily at 6 (six) AM.   Yes [provider]  clonazePAM (KLONOPIN) 0.5 MG tablet Take 1 mg by mouth 2 (two) times daily.   Yes [provider]  dextromethorphan-guaiFENesin (MUCINEX DM) 30-600 MG 12hr tablet Take 1 tablet by mouth 2 (two) times daily. 11/10/21  Yes Vassie Loll, MD  dorzolamide-timolol (COSOPT) 22.3-6.8 MG/ML ophthalmic  solution Place 1 drop into the right eye 2 (two) times daily.   Yes [provider]  ELDERBERRY PO Take by mouth.   Yes [provider]  esomeprazole (NEXIUM) 40 MG capsule Take 40 mg by mouth daily.   Yes [provider]  fluticasone (FLONASE) 50 MCG/ACT nasal spray Instill 2 puffs into each nostril every morning. 01/16/18  Yes [provider]  hydrochlorothiazide (HYDRODIURIL) 25 MG tablet Take 0.5 tablets (12.5 mg total) by mouth daily. 11/12/21  Yes Vassie Loll, MD  levocetirizine (XYZAL) 5 MG tablet Take 5 mg by mouth in the morning.   Yes [provider]  levothyroxine (SYNTHROID) 100 MCG tablet Take 100 mcg by mouth every morning.   Yes [provider]  lisinopril (ZESTRIL) 20 MG tablet Take 20 mg by mouth daily. 07/05/20  Yes [provider]  Melatonin (MELATONIN EXTRA STRENGTH) 10 MG TABS Take 1 tablet by mouth at bedtime.   Yes [provider]  montelukast (SINGULAIR) 10 MG tablet Take 10 mg by mouth at bedtime. 01/03/23  Yes [provider]  Multiple Vitamin (MULTI-VITAMIN) tablet Take 1 tablet by mouth daily.   Yes [provider]  venlafaxine (EFFEXOR-XR) 150 MG 24 hr capsule Take 300 mg by mouth daily with breakfast.   Yes [provider]  zolpidem (AMBIEN) 10 MG tablet Take 10 mg by mouth at bedtime as needed for sleep. 02/04/19  Yes [provider]  nicotine (NICODERM CQ - DOSED IN  MG/24 HOURS) 14 mg/24hr patch Place 1 patch (14 mg total) onto the skin daily. Patient not taking: Reported on 03/16/2023 11/11/21   Vassie Loll, MD  nystatin (MYCOSTATIN) 100000 UNIT/ML suspension SWISH AND SWALLOW FOUR TIMES DAILY FOR 20 DAYS Patient not taking: Reported on 03/16/2023 12/11/22   [provider]    Physical Exam: Vitals:   03/17/23 0200 03/17/23 0201 03/17/23 0225 03/17/23 0454  BP:  138/73  133/83  Pulse:  73  72  Resp:  20  18  Temp:  98.2 F (36.8 C)  98.7 F (37.1 C)  TempSrc:      SpO2:  92% 97% 94%  Weight: 57.2 kg     Height:       1.  General: Patient lying supine in bed,  no acute distress   2. Psychiatric: Alert and oriented x 3, mood and behavior normal for situation, pleasant and cooperative with exam   3. Neurologic: Speech and language are normal, face is symmetric, moves all 4 extremities voluntarily, at baseline without acute deficits on limited exam   4. HEENMT:  Head is atraumatic, normocephalic, pupils reactive to light, neck is supple, trachea is midline, mucous membranes are moist   5. Respiratory : Lungs are diminished in the lower lung fields bilaterally but otherwise clear to auscultation bilaterally without wheezing, rhonchi, rales, no cyanosis, no increase in work of breathing or accessory muscle use   6. Cardiovascular : Heart rate normal, rhythm is regular, no murmurs, rubs or gallops, no peripheral edema, peripheral pulses palpated   7. Gastrointestinal:  Abdomen is soft, nondistended, nontender to palpation bowel sounds active, no masses or organomegaly palpated   8. Skin:  Skin is warm, dry and intact without rashes, acute lesions, or ulcers on limited exam   9.Musculoskeletal:  No acute deformities or trauma, no asymmetry in tone, no peripheral edema, peripheral pulses palpated, no tenderness to palpation in the extremities  Data Reviewed: In the ED Temp 97.7, heart rate 63-74,  respiratory rate 13-23, blood  pressure 144/82-238/113, satting 80-95% on 2 L nasal cannula No leukocytosis, hemoglobin stable Chemistries unremarkable aside from a hyponatremia and hypochloremia Osmolality 254 TSH is slightly elevated at 5.495 UA is not indicative of UTI Chest x-ray shows emphysema and chronic bronchial wall thickening EKG shows a heart rate of 66, sinus rhythm, QTc 422 Hydralazine 25 mg and 50 mg given p.o. in the ED Lisinopril 20 mg given in the ED Patient was given 1 L normal saline and Solu-Medrol 125 mg Admission was requested for hypertensive crisis  Assessment and Plan: * Hypertensive crisis - Blood pressure got as high as 238/113 - Patient was given multiple doses of p.o. antihypertensives in the ED - Her blood pressure then dropped more than anticipated down to systolic 90s - Given her hyponatremia we will be holding hydrochlorothiazide - Bumping up lisinopril to 40 mg daily - Continue to monitor  COPD (chronic obstructive pulmonary disease) (HCC) - Hypoxia with ambulation - No wheeze on exam but patient is diminished in the lower lung fields - Continue home Advair and montelukast - Patient was treated with albuterol and multiple DuoNebs in the ED - Continue albuterol every 2 as needed - Continue DuoNeb every 6 - Monitor clinical response  Psychiatric disorder - Continue Abilify, holding venlafaxine given hyponatremia  Acute respiratory failure with hypoxia (HCC) - Patient dropped down to 80% on 2 L nasal cannula with ambulation - EKG shows heart rate 66, sinus rhythm, QTc 422 with no acute ST changes - Chest x-ray shows emphysema and chronic bronchial thickening with no evidence of pulmonary mass or nodule - Troponin was normal at 3 - Patient did have hypertensive crisis so there was concern for flash pulmonary edema, but that was not seen on chest x-ray obviously as listed above - Patient does have a history of COPD and that is likely contributing to her hypoxia - She was  treated with albuterol and 2 DuoNebs in the ED - She was also started on Solu-Medrol - Continue albuterol every 2 as needed - Continue Solu-Medrol daily - Continue DuoNeb every 6 hours scheduled - Wean off O2 as tolerated - Monitor on telemetry  Hyponatremia - Sodium down to 119 - Every 6 hours BMP was ordered, but for some reason it has been 10 hours since last BMP - Updated BMP currently pending - Sodium previously 133 and 125 last year and then 119 today - Asymptomatic - Gentle IV hydration - Start sodium tabs - Holding venlafaxine and hydrochlorothiazide - Continue to monitor  Hypothyroidism (acquired) - Continue Synthroid at increased dose - Increased dose from 100>> 125 mcg due to elevated TSH      Advance Care Planning:   Code Status: Full Code  Consults: None at this time  Family Communication: No family at bedside  Severity of Illness: The appropriate patient status for this patient is OBSERVATION. Observation status is judged to be reasonable and necessary in order to provide the required intensity of service to ensure the patient's safety. The patient's presenting symptoms, physical exam findings, and initial radiographic and laboratory data in the context of their medical condition is felt to place them at decreased risk for further clinical deterioration. Furthermore, it is anticipated that the patient will be medically stable for discharge from the hospital within 2 midnights of admission.   Author: Lilyan Gilford, DO 03/17/2023 5:58 AM  For on call review [email or web address].

## 2023-03-17 NOTE — Hospital Course (Signed)
Dorothy [name] is a 67 y.o. female with medical history significant of anxiety, depression, GERD, hyperlipidemia, hypertension, hypothyroidism, COPD, and more presents the ED with a chief complaint of elevated blood pressure.  Patient reports that she was seeing an outpatient doctor when it was noted that her blood pressure was over 200.  They told her to monitor it when she got home.  It continued to be over 200 at home, so she called a different outpatient doctor who's nurse advised her to come into the ED.  Patient reports that she was completely asymptomatic.  She reports no chest pain, palpitations, dyspnea, headaches.   Patient is on multiple psych medications but reports that none of them are new.  Her hydrochlorothiazide is not a new medication either.  Patient reports she does not normally monitor her blood pressure at home so she does not know what it is normally running.  She denies any symptoms of hyponatremia including dizziness, generalized weakness, fatigue, nausea, vomiting.  She has no other complaints at this time.   Patient smokes and would like a nicotine patch while she is here.  She reports she is ready to quit she is tested Tuesday.  She does not drink alcohol, does not use illicit drugs.  She is vaccinated for COVID.  Patient reports she is full code.  ED Temp 97.7, heart rate 63-74, respiratory rate 13-23, blood pressure 144/82-238/113, satting 80-95% on 2 L nasal cannula No leukocytosis, hemoglobin stable Chemistries unremarkable aside from a hyponatremia and hypochloremia Osmolality 254 TSH is slightly elevated at 5.495 UA is not indicative of UTI Chest x-ray shows emphysema and chronic bronchial wall thickening EKG shows a heart rate of 66, sinus rhythm, QTc 422 Hydralazine 25 mg and 50 mg given p.o. in the ED Lisinopril 20 mg given in the ED Patient was given 1 L normal saline and Solu-Medrol 125 mg Admission was requested for hypertensive crisis

## 2023-03-17 NOTE — TOC Initial Note (Addendum)
Transition of Care [facility]) - Initial/Assessment Note    Patient Details  Name: Dorothy [name] MRN: [id number] Date of Birth: 09-12-1956  Transition of Care [facility]) CM/SW Contact:    Catalina Gravel, LCSW Phone Number: 03/17/2023, 12:54 PM  Clinical Narrative:                 PT adm obs status due to HTN.  CSW completed OBS and delivered copy at bedside. Potential DC 5/26. TOC to follow.     Barriers to Discharge: Continued Medical Work up   Patient Goals and CMS Choice            Expected Discharge Plan and Services                                              Prior Living Arrangements/Services                       Activities of Daily Living Home Assistive Devices/Equipment: None ADL Screening (condition at time of admission) Patient's cognitive ability adequate to safely complete daily activities?: Yes Is the patient deaf or have difficulty hearing?: No Does the patient have difficulty seeing, even when wearing glasses/contacts?: No Does the patient have difficulty concentrating, remembering, or making decisions?: No Patient able to express need for assistance with ADLs?: Yes Does the patient have difficulty dressing or bathing?: No Independently performs ADLs?: Yes (appropriate for developmental age) Does the patient have difficulty walking or climbing stairs?: No Weakness of Legs: None Weakness of Arms/Hands: None  Permission Sought/Granted                  Emotional Assessment              Admission diagnosis:  Hypertensive crisis [I16.9] SOB (shortness of breath) [R06.02] Patient Active Problem List   Diagnosis Date Noted   Hypertensive crisis 03/17/2023   Psychiatric disorder 03/17/2023   COPD (chronic obstructive pulmonary disease) (HCC) 03/17/2023   SOB (shortness of breath) 03/17/2023   Hypotension 11/08/2021   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 11/04/2020   Hypertensive  retinopathy of both eyes 11/04/2020   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 11/04/2020   Nuclear sclerotic cataract of both eyes 11/04/2020   Peripheral focal chorioretinal inflammation of both eyes 11/04/2020   Pulmonary nodule 08/18/2016   COPD with exacerbation (HCC) 08/18/2016   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   Acute respiratory failure with hypoxia (HCC) 09/25/2012   Hyponatremia 07/10/2012   Abdominal pain 07/10/2012   Hematochezia 07/10/2012   Avascular necrosis of bones of both hips (HCC) 01/17/2012   Hypothyroidism (acquired) 01/17/2012   Benign essential HTN 01/17/2012   Depression with anxiety 01/17/2012   Cancer of cervix (HCC) 01/17/2012   Ovarian cyst 01/17/2012   Community acquired pneumonia 01/17/2012   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   Rash and other nonspecific skin eruption 01/17/2012   Personal history of colonic polyps 11/07/2007   PCP:  Erasmo Downer, NP Pharmacy:   [facility] [address] [place] - [address] [place] [address] Phone: [phone] Fax: [phone]  [facility] - [place], [place] - [address] [place] [address] Phone: [phone] Fax: [phone]     Social  Determinants of Health (SDOH) Social History: SDOH Screenings   Tobacco Use: High Risk (03/16/2023)   SDOH Interventions:     Readmission Risk Interventions     No data to display

## 2023-03-17 NOTE — Assessment & Plan Note (Addendum)
-   Sodium down to 119 >>> 125 - Every 6 hours BMP was ordered, but for some reason it has been 10 hours since last BMP - Updated BMP currently pending - Sodium previously 133 and 125 last year and then 119 today - Asymptomatic - Gentle IV hydration with normal saline -Pursuing SIADH workup (serum/urine osmolality, serum/urine sodium) - Start sodium tabs - Holding venlafaxine and hydrochlorothiazide - Continue to monitor

## 2023-03-17 NOTE — Assessment & Plan Note (Addendum)
-   Hypoxia with ambulation -Improved wheezing, shortness of breath - Continue home Advair and montelukast/using IV steroids - - Continue DuoNeb every 6 - Monitor clinical response

## 2023-03-18 DIAGNOSIS — I169 Hypertensive crisis, unspecified: Secondary | ICD-10-CM | POA: Diagnosis not present

## 2023-03-18 LAB — GLUCOSE, CAPILLARY
Glucose-Capillary: 104 mg/dL — ABNORMAL HIGH (ref 70–99)
Glucose-Capillary: 143 mg/dL — ABNORMAL HIGH (ref 70–99)
Glucose-Capillary: 132 mg/dL — ABNORMAL HIGH (ref 70–99)

## 2023-03-18 LAB — BASIC METABOLIC PANEL
Anion gap: 9 (ref 5–15)
BUN: 9 mg/dL (ref 8–23)
CO2: 22 mmol/L (ref 22–32)
Calcium: 8.3 mg/dL — ABNORMAL LOW (ref 8.9–10.3)
Chloride: 95 mmol/L — ABNORMAL LOW (ref 98–111)
Creatinine, Ser: 0.71 mg/dL (ref 0.44–1.00)
GFR, Estimated: >60 mL/min (ref >60)
Glucose, Bld: 112 mg/dL — ABNORMAL HIGH (ref 70–99)
Potassium: 3.9 mmol/L (ref 3.5–5.1)
Sodium: 126 mmol/L — ABNORMAL LOW (ref 135–145)

## 2023-03-18 LAB — UREA NITROGEN, URINE: Urea Nitrogen, Ur: 44 mg/dL

## 2023-03-18 MED ORDER — HYDRALAZINE HCL 25 MG PO TABS
50.0000 mg | ORAL_TABLET | Freq: Three times a day (TID) | ORAL | Status: DC
  Administered 2023-03-18 – 2023-03-19 (×3): 50 mg via ORAL
  Filled 2023-03-18 (×3): qty 2

## 2023-03-18 MED ORDER — METHYLPREDNISOLONE SODIUM SUCC 40 MG IJ SOLR
40.0000 mg | Freq: Every day | INTRAMUSCULAR | Status: DC
  Administered 2023-03-18: 40 mg via INTRAVENOUS
  Filled 2023-03-18: qty 1

## 2023-03-18 NOTE — Care Management Obs Status (Signed)
MEDICARE OBSERVATION STATUS NOTIFICATION   Patient Details  Name: Dorothy [name] MRN: [id number] Date of Birth: 1956/09/25   Medicare Observation Status Notification Given:  Yes (completed 5/25)    Catalina Gravel, LCSW 03/18/2023, 11:25 AM

## 2023-03-18 NOTE — Progress Notes (Signed)
No complaints overnight.

## 2023-03-18 NOTE — TOC Initial Note (Addendum)
Transition of Care [facility]) - Initial/Assessment Note    Patient Details  Name: Dorothy [name] MRN: [id number] Date of Birth: 02/27/1956  Transition of Care [facility]) CM/SW Contact:    Catalina Gravel, LCSW Phone Number: 03/18/2023, 3:40 PM  Clinical Narrative:                 CSW completed assessment. Patient lives with adult siblings in her home. She describes sister as ill with COPD and brother previously with trache and throat cancer.  Brother now does all cooking and driving pt and sister around.  Pt is seen at [facility] and sister at [facility]. Brother transports. Pt had 2 hip replacements in 2012  and had HHPT at that time. Pt does bathing herself, has rails in bathroom and shower.  Pt also has a shower chair. TOC will continue  to follow.   Expected Discharge Plan: Home/Self Care Barriers to Discharge: Continued Medical Work up   Patient Goals and CMS Choice Patient states their goals for this hospitalization and ongoing recovery are:: Return home          Expected Discharge Plan and Services In-house Referral: Clinical Social Work     Living arrangements for the past 2 months: Single Family Home                                      Prior Living Arrangements/Services Living arrangements for the past 2 months: Single Family Home Lives with:: Siblings (Lives with 2 adult siblings, older brother still drives.) Patient language and need for interpreter reviewed:: Yes Do you feel safe going back to the place where you live?: Yes      Need for Family Participation in Patient Care: Yes (Comment) Care giver support system in place?: Yes (comment) (Pt lives with 2 adult siblings in her home.)   Criminal Activity/Legal Involvement Pertinent to Current Situation/Hospitalization: No - Comment as needed  Activities of Daily Living Home Assistive Devices/Equipment: None ADL Screening (condition at time of admission) Patient's cognitive ability adequate to  safely complete daily activities?: Yes Is the patient deaf or have difficulty hearing?: No Does the patient have difficulty seeing, even when wearing glasses/contacts?: No Does the patient have difficulty concentrating, remembering, or making decisions?: No Patient able to express need for assistance with ADLs?: Yes Does the patient have difficulty dressing or bathing?: No Independently performs ADLs?: Yes (appropriate for developmental age) Does the patient have difficulty walking or climbing stairs?: No Weakness of Legs: None Weakness of Arms/Hands: None  Permission Sought/Granted                  Emotional Assessment Appearance:: Appears stated age Attitude/Demeanor/Rapport: Gracious Affect (typically observed): Calm, Happy, Pleasant, Stable Orientation: : Oriented to Self, Oriented to Place, Oriented to Situation      Admission diagnosis:  Hypertensive crisis [I16.9] SOB (shortness of breath) [R06.02] Patient Active Problem List   Diagnosis Date Noted   Hypertensive crisis 03/17/2023   Psychiatric disorder 03/17/2023   COPD (chronic obstructive pulmonary disease) (HCC) 03/17/2023   SOB (shortness of breath) 03/17/2023   Hypotension 11/08/2021   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 11/04/2020   Hypertensive retinopathy of both eyes 11/04/2020   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 11/04/2020   Nuclear sclerotic cataract of both eyes 11/04/2020   Peripheral focal chorioretinal inflammation of both eyes  11/04/2020   Pulmonary nodule 08/18/2016   COPD with exacerbation (HCC) 08/18/2016   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   Acute respiratory failure with hypoxia (HCC) 09/25/2012   Hyponatremia 07/10/2012   Abdominal pain 07/10/2012   Hematochezia 07/10/2012   Avascular necrosis of bones of both hips (HCC) 01/17/2012   Hypothyroidism (acquired) 01/17/2012   Benign essential HTN  01/17/2012   Depression with anxiety 01/17/2012   Cancer of cervix (HCC) 01/17/2012   Ovarian cyst 01/17/2012   Community acquired pneumonia 01/17/2012   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 [place] [address] Phone: [phone] Fax: [phone]     Social Determinants of Health (SDOH) Social History: SDOH Screenings   Tobacco Use: High Risk (03/16/2023)   SDOH Interventions:     Readmission Risk Interventions    03/18/2023    3:36 PM  Readmission Risk Prevention Plan  Transportation Screening Complete  PCP or Specialist Appt within 5-7 Days Complete  Home Care Screening Complete  Medication Review (RN CM) Complete

## 2023-03-18 NOTE — Progress Notes (Signed)
PROGRESS NOTE    Patient: Dorothy [name]                            PCP: Erasmo Downer, NP                    DOB: 1955/12/16            DOA: 03/16/2023 [id number]             DOS: 03/18/2023, 9:42 AM   LOS: 1 day   Date of Service: The patient was seen and examined on 03/18/2023  Subjective:   The patient was seen and examined this morning, stable reporting improved shortness of breath, off supplemental oxygen Improved wheezing Witnessed patient was drinking lots of [facility]  Brief Narrative:   Dorothy [name] is a 67 y.o. female with medical history significant of anxiety, depression, GERD, hyperlipidemia, hypertension, hypothyroidism, COPD, and more presents the ED with a chief complaint of elevated blood pressure.  Patient reports that she was seeing an outpatient doctor when it was noted that her blood pressure was over 200.  They told her to monitor it when she got home.  It continued to be over 200 at home, so she called a different outpatient doctor who's nurse advised her to come into the ED.  Patient reports that she was completely asymptomatic.  She reports no chest pain, palpitations, dyspnea, headaches.   Patient is on multiple psych medications but reports that none of them are new.  Her hydrochlorothiazide is not a new medication either.  Patient reports she does not normally monitor her blood pressure at home so she does not know what it is normally running.  She denies any symptoms of hyponatremia including dizziness, generalized weakness, fatigue, nausea, vomiting.  She has no other complaints at this time.   Patient smokes and would like a nicotine patch while she is here.  She reports she is ready to quit she is tested Tuesday.  She does not drink alcohol, does not use illicit drugs.  She is vaccinated for COVID.  Patient reports she is full code.  ED Temp 97.7, heart rate 63-74, respiratory rate 13-23, blood pressure 144/82-238/113, satting 80-95% on 2 L  nasal cannula No leukocytosis, hemoglobin stable Chemistries unremarkable aside from a hyponatremia and hypochloremia Osmolality 254 TSH is slightly elevated at 5.495 UA is not indicative of UTI Chest x-ray shows emphysema and chronic bronchial wall thickening EKG shows a heart rate of 66, sinus rhythm, QTc 422 Hydralazine 25 mg and 50 mg given p.o. in the ED Lisinopril 20 mg given in the ED Patient was given 1 L normal saline and Solu-Medrol 125 mg Admission was requested for hypertensive crisis    Assessment & Plan:   Principal Problem:   Hypertensive crisis Active Problems:   Hypothyroidism (acquired)   Hyponatremia   Acute respiratory failure with hypoxia (HCC)   Psychiatric disorder   COPD (chronic obstructive pulmonary disease) (HCC)   SOB (shortness of breath)     Assessment and Plan: * Hypertensive crisis - Blood pressure got as high as 238/113 -Blood pressure improving this morning 151/63 - Given her hyponatremia we will be holding hydrochlorothiazide - Bumping up lisinopril to 40 mg daily -Added and increasing hydralazine to 50 mg p.o. 3 times daily - Continue to monitor  COPD (chronic obstructive pulmonary disease) (HCC) - Hypoxia with ambulation -Improved wheezing, shortness of breath - Continue home  Advair and montelukast/using IV steroids - - Continue DuoNeb every 6 - Monitor clinical response  Psychiatric disorder - Continue Abilify, holding venlafaxine given hyponatremia  Acute respiratory failure with hypoxia (HCC) - Much improved shortness of breath now shortness of breath with exertion -Off supplemental oxygen, on room air satting 94%  - POA: Patient dropped down to 80% on 2 L nasal cannula with ambulation - EKG shows heart rate 66, sinus rhythm, QTc 422 with no acute ST changes - Chest x-ray shows emphysema and chronic bronchial thickening with no evidence of pulmonary mass or nodule - Troponin was normal at 3 - Pulmonary hypertension rule  out by x-ray as BP improved  -History of COPD, likely COPD exacerbation  - Will continue with DuoNeb breathing treatments, IV steroids,  - Continue DuoNeb every 6 hours scheduled  - Monitor on telemetry  -CTA-emphysematous changes, benign nodule, otherwise within normal limits negative for pulmonary embolism  Hyponatremia - Sodium down to 119 >>> 122>> 126  - Monitoring - Sodium previously 133 and 125 last year  - Asymptomatic - Gentle IV hydration with normal saline -Pursuing SIADH workup (serum osmolality 267 (low), serum sodium 122, urine osmolality 70 (low), urine potassium 5, sodium 21 - Holding venlafaxine and hydrochlorothiazide - Continue to monitor  Hypothyroidism (acquired) - Continue Synthroid at increased dose - Increased dose from 100>> 125 mcg due to elevated  TSH - 5.49 Free T4 1.38 -mildly elevated     --------------------------------------------------------------------------------------------------------------------------- Nutritional status:  The patient's BMI is: Body mass index is 19.75 kg/m. I agree with the assessment and plan as outlined -------------------------------------------------------------------------------------------------------------------------  DVT prophylaxis:  heparin injection 5,000 Units Start: 03/17/23 0600 SCDs Start: 03/17/23 0157   Code Status:   Code Status: Full Code  Family Communication: No family member present at bedside- attempt will be made to update daily The above findings and plan of care has been discussed with patient (and family)  in detail,  they expressed understanding and agreement of above. -Advance care planning has been discussed.   Admission status:   Status is: Observation The patient remains OBS appropriate and will d/c before 2 midnights.   Disposition: From  - home             Planning for discharge in 1-2 days: to   Procedures:   No admission procedures for hospital  encounter.   Antimicrobials:  Anti-infectives (From admission, onward)    None        Medication:   ARIPiprazole  10 mg Oral Daily   aspirin EC  81 mg Oral Daily   atorvastatin  40 mg Oral Daily   clonazePAM  0.5 mg Oral QHS   heparin  5,000 Units Subcutaneous Q8H   hydrALAZINE  50 mg Oral Q8H   ipratropium-albuterol  3 mL Nebulization TID   levothyroxine  125 mcg Oral Q0600   lisinopril  40 mg Oral Daily   melatonin  9 mg Oral QHS   methylPREDNISolone (SOLU-MEDROL) injection  40 mg Intravenous Daily   mometasone-formoterol  2 puff Inhalation BID   montelukast  10 mg Oral QHS   nicotine  21 mg Transdermal Daily   sodium chloride  1 g Oral BID WC    acetaminophen **OR** acetaminophen, albuterol, morphine injection, ondansetron **OR** ondansetron (ZOFRAN) IV, oxyCODONE   Objective:   Vitals:   03/17/23 1916 03/17/23 2057 03/18/23 0419 03/18/23 0728  BP:  125/67 (!) 151/63   Pulse:  91 93   Resp:  20 16  Temp:  98.2 F (36.8 C) 98.7 F (37.1 C)   TempSrc:  Oral    SpO2: 92% 99% 92% 94%  Weight:      Height:        Intake/Output Summary (Last 24 hours) at 03/18/2023 0942 Last data filed at 03/18/2023 0553 Gross per 24 hour  Intake 766.96 ml  Output 600 ml  Net 166.96 ml   Filed Weights   03/16/23 1423 03/17/23 0200  Weight: 59 kg 57.2 kg     Physical examination:       General:  AAO x 3,  cooperative, no distress;   HEENT:  Normocephalic, PERRL, otherwise with in Normal limits   Neuro:  CNII-XII intact. , normal motor and sensation, reflexes intact   Lungs:   Clear to auscultation BL, Respirations unlabored,  No wheezes / crackles  Cardio:    S1/S2, RRR, No murmure, No Rubs or Gallops   Abdomen:  Soft, non-tender, bowel sounds active all four quadrants, no guarding or peritoneal signs.  Muscular  skeletal:  Limited exam -global generalized weaknesses - in bed, able to move all 4 extremities,   2+ pulses,  symmetric, No pitting edema  Skin:   Dry, warm to touch, negative for any Rashes,  Wounds: Please see nursing documentation          ------------------------------------------------------------------------------------------------------------------------------------------    LABs:     Latest Ref Rng & Units 03/17/2023    4:34 AM 03/16/2023    4:09 PM 11/10/2021    5:57 AM  CBC  WBC 4.0 - 10.5 K/uL 7.8  8.8  10.6   Hemoglobin 12.0 - 15.0 g/dL 16.1  09.6  04.5   Hematocrit 36.0 - 46.0 % 40.5  45.6  40.0   Platelets 150 - 400 K/uL 270  297  186       Latest Ref Rng & Units 03/18/2023   12:21 AM 03/17/2023    6:15 PM 03/17/2023   12:27 PM  CMP  Glucose 70 - 99 mg/dL 409  811  914   BUN 8 - 23 mg/dL 9  10  10    Creatinine 0.44 - 1.00 mg/dL 7.82  9.56  2.13   Sodium 135 - 145 mmol/L 126  122  122   Potassium 3.5 - 5.1 mmol/L 3.9  3.5  3.2   Chloride 98 - 111 mmol/L 95  88  87   CO2 22 - 32 mmol/L 22  23  24    Calcium 8.9 - 10.3 mg/dL 8.3  8.1  8.0        Micro Results No results found for this or any previous visit (from the past 240 hour(s)).  Radiology Reports CT Angio Chest Pulmonary Embolism (PE) W or WO Contrast  Result Date: 03/17/2023 CLINICAL DATA:  Rule out pulmonary embolism. EXAM: CT ANGIOGRAPHY CHEST WITH CONTRAST TECHNIQUE: Multidetector CT imaging of the chest was performed using the standard protocol during bolus administration of intravenous contrast. Multiplanar CT image reconstructions and MIPs were obtained to evaluate the vascular anatomy. RADIATION DOSE REDUCTION: This exam was performed according to the departmental dose-optimization program which includes automated exposure control, adjustment of the mA and/or kV according to patient size and/or use of iterative reconstruction technique. CONTRAST:  75mL OMNIPAQUE IOHEXOL 350 MG/ML SOLN COMPARISON:  LDCT 12/06/2021 CTA chest 08/18/2016 FINDINGS: Cardiovascular: Satisfactory opacification of the pulmonary arteries to the segmental level. No  evidence of pulmonary embolism. Normal heart size. No pericardial effusion. Aortic atherosclerosis and coronary artery calcifications. Mediastinum/Nodes:  No enlarged mediastinal, hilar, or axillary lymph nodes. Thyroid gland, trachea, and esophagus demonstrate no significant findings. Lungs/Pleura: No pleural effusion, airspace consolidation or pneumothorax. Centrilobular emphysema and diffuse bronchial wall thickening. Stable subpleural nodule in the periphery of the right middle lobe which measures 4 mm on today's study. Upper Abdomen: No acute findings identified. 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 Several simple cysts are identified in the left lobe and appears stable from previous exam. Unchanged bilateral adrenal nodules compatible with benign adenomas. No follow-up imaging recommended. Musculoskeletal: No chest wall abnormality. No acute or significant osseous findings. Review of the MIP images confirms the above findings. IMPRESSION: 1. No evidence for acute pulmonary embolism. 2. Diffuse bronchial wall thickening with emphysema, as above; imaging findings suggestive of underlying COPD. 3. Stable 4 mm right middle lobe lung nodule. This is compatible with a benign nodule. No follow-up imaging recommended. 4. Aortic Atherosclerosis (ICD10-I70.0) and Emphysema (ICD10-J43.9). Electronically Signed   By: Signa Kell M.D.   On: 03/17/2023 10:43    SIGNED: Kendell Bane, MD, FHM. FAAFP. [facility] - [facility] Time spent - 35 min.  In seeing, evaluating and examining the patient. Reviewing medical records, labs, drawn plan of care. [facility],  Pager (please use [email or web address] to page/ text) Please use Epic Secure Chat for non-urgent communication (7AM-7PM)  If 7PM-7AM, please contact night-coverage [email or web address], 03/18/2023, 9:42 AM

## 2023-03-19 DIAGNOSIS — I169 Hypertensive crisis, unspecified: Secondary | ICD-10-CM | POA: Diagnosis not present

## 2023-03-19 LAB — BASIC METABOLIC PANEL
Anion gap: 12 (ref 5–15)
BUN: 5 mg/dL — ABNORMAL LOW (ref 8–23)
CO2: 20 mmol/L — ABNORMAL LOW (ref 22–32)
Calcium: 8.1 mg/dL — ABNORMAL LOW (ref 8.9–10.3)
Chloride: 100 mmol/L (ref 98–111)
Creatinine, Ser: 0.67 mg/dL (ref 0.44–1.00)
GFR, Estimated: >60 mL/min (ref >60)
Glucose, Bld: 118 mg/dL — ABNORMAL HIGH (ref 70–99)
Potassium: 3.6 mmol/L (ref 3.5–5.1)
Sodium: 132 mmol/L — ABNORMAL LOW (ref 135–145)

## 2023-03-19 LAB — GLUCOSE, CAPILLARY: Glucose-Capillary: 87 mg/dL (ref 70–99)

## 2023-03-19 MED ORDER — ADVAIR DISKUS 250-50 MCG/ACT IN AEPB: 1.0000 | INHALATION_SPRAY | Freq: Two times a day (BID) | RESPIRATORY_TRACT | 2 refills | Status: DC

## 2023-03-19 MED ORDER — ALBUTEROL SULFATE HFA 108 (90 BASE) MCG/ACT IN AERS: 2.0000 | INHALATION_SPRAY | RESPIRATORY_TRACT | 3 refills | Status: DC

## 2023-03-19 MED ORDER — LABETALOL HCL 200 MG PO TABS
200.0000 mg | ORAL_TABLET | Freq: Two times a day (BID) | ORAL | Status: DC
  Administered 2023-03-19: 200 mg via ORAL
  Filled 2023-03-19: qty 1

## 2023-03-19 MED ORDER — LISINOPRIL 20 MG PO TABS: 20.0000 mg | ORAL_TABLET | Freq: Every day | ORAL | 2 refills | Status: DC

## 2023-03-19 MED ORDER — LEVOTHYROXINE SODIUM 125 MCG PO TABS: 125.0000 ug | ORAL_TABLET | Freq: Every day | ORAL | 1 refills | Status: DC

## 2023-03-19 MED ORDER — IPRATROPIUM-ALBUTEROL 0.5-2.5 (3) MG/3ML IN SOLN: 3.0000 mL | Freq: Two times a day (BID) | RESPIRATORY_TRACT | Status: DC

## 2023-03-19 MED ORDER — METHYLPREDNISOLONE 4 MG PO TBPK: ORAL_TABLET | ORAL | 0 refills | Status: DC

## 2023-03-19 MED ORDER — HYDRALAZINE HCL 20 MG/ML IJ SOLN: 10.0000 mg | INTRAMUSCULAR | Status: DC | PRN

## 2023-03-19 MED ORDER — LABETALOL HCL 200 MG PO TABS: 200.0000 mg | ORAL_TABLET | Freq: Two times a day (BID) | ORAL | 1 refills | Status: DC

## 2023-03-19 MED ORDER — METHYLPREDNISOLONE SODIUM SUCC 40 MG IJ SOLR
40.0000 mg | Freq: Every day | INTRAMUSCULAR | Status: AC
  Administered 2023-03-19: 40 mg via INTRAVENOUS
  Filled 2023-03-19: qty 1

## 2023-03-19 MED ORDER — NICOTINE 21 MG/24HR TD PT24: 21.0000 mg | MEDICATED_PATCH | Freq: Every day | TRANSDERMAL | 0 refills | Status: DC

## 2023-03-19 NOTE — Progress Notes (Signed)
   03/19/23 0528  Vitals  Temp 98.7 F (37.1 C)  BP (!) 198/100  MAP (mmHg) 133  BP Location Left Arm  Patient Position (if appropriate) Sitting  Pulse Rate 83  Pulse Rate Source Monitor  Resp 18  MEWS COLOR  MEWS Score Color Green  Oxygen Therapy  SpO2 97 %  O2 Device Room Air  MEWS Score  MEWS Temp 0  MEWS Systolic 0  MEWS Pulse 0  MEWS RR 0  MEWS LOC 0  MEWS Score 0  Provider Notification  Provider Name/Title Dr. Carren Rang  Date Provider Notified 03/19/23  Time Provider Notified [phone]  Method of Notification  (Secure Chat)  Notification Reason Other (Comment) (Elevated BP 198/100)  Provider response No new orders  Date of Provider Response 03/19/23  Time of Provider Response 0540   Dr. Dorthula PerfectCamillo Flaming notified. Patient scheduled Hydralazine 50mg  given. Continuing to monitor, no new orders at this time.

## 2023-03-19 NOTE — Discharge Summary (Signed)
Physician Discharge Summary   Patient: Dorothy [name] MRN: [id number] DOB: 16-Apr-1956  Admit date:     03/16/2023  Discharge date: 03/19/23  Discharge Physician: Kendell Bane   PCP: Erasmo Downer, NP   Recommendations at discharge:   -Avoid alcohol, alcohol products, avoid excessive fluid intake such as pure water, drinks such as [facility] -Monitor your blood pressure daily -Current medications -Follow-up with PCP in 1-2 weeks   Discharge Diagnoses: Principal Problem:   Hypertensive crisis Active Problems:   Hypothyroidism (acquired)   Hyponatremia   Acute respiratory failure with hypoxia (HCC)   Psychiatric disorder   COPD (chronic obstructive pulmonary disease) (HCC)   SOB (shortness of breath)  Resolved Problems:   * No resolved hospital problems. *  Hospital Course: Dorothy [name] is a 67 y.o. female with medical history significant of anxiety, depression, GERD, hyperlipidemia, hypertension, hypothyroidism, COPD, and more presents the ED with a chief complaint of elevated blood pressure.  Patient reports that she was seeing an outpatient doctor when it was noted that her blood pressure was over 200.  They told her to monitor it when she got home.  It continued to be over 200 at home, so she called a different outpatient doctor who's nurse advised her to come into the ED.  Patient reports that she was completely asymptomatic.  She reports no chest pain, palpitations, dyspnea, headaches.   Patient is on multiple psych medications but reports that none of them are new.  Her hydrochlorothiazide is not a new medication either.  Patient reports she does not normally monitor her blood pressure at home so she does not know what it is normally running.  She denies any symptoms of hyponatremia including dizziness, generalized weakness, fatigue, nausea, vomiting.  She has no other complaints at this time.   Patient smokes and would like a nicotine patch while she is here.   She reports she is ready to quit she is tested Tuesday.  She does not drink alcohol, does not use illicit drugs.  She is vaccinated for COVID.  Patient reports she is full code.  ED Temp 97.7, heart rate 63-74, respiratory rate 13-23, blood pressure 144/82-238/113, satting 80-95% on 2 L nasal cannula No leukocytosis, hemoglobin stable Chemistries unremarkable aside from a hyponatremia and hypochloremia Osmolality 254 TSH is slightly elevated at 5.495 UA is not indicative of UTI Chest x-ray shows emphysema and chronic bronchial wall thickening EKG shows a heart rate of 66, sinus rhythm, QTc 422 Hydralazine 25 mg and 50 mg given p.o. in the ED Lisinopril 20 mg given in the ED Patient was given 1 L normal saline and Solu-Medrol 125 mg Admission was requested for hypertensive crisis  Assessment and Plan: * Hypertensive crisis - Blood pressure got as high as 238/113 -Blood pressure improving this morning 151/63 - Given her hyponatremia - D/Ced  hydrochlorothiazide - lisinopril to 20 mg daily -Added labetalol to 200 mg p.o. twice daily - Continue to monitor  COPD (chronic obstructive pulmonary disease) (HCC) - Hypoxia with ambulation >> much improved -Improved wheezing, shortness of breath - Continue home Advair and montelukast/using IV steroids>> switch to p.o. steroids, -Status post inpatient treatment with DuoNeb every 6/and IV steroids   Psychiatric disorder - Continue Abilify,  -Discontinue venlafaxine given hyponatremia  Acute respiratory failure with hypoxia (HCC) -Resolved -Off supplemental oxygen, on room air satting 94%  - POA: Patient dropped down to 80% on 2 L nasal cannula with ambulation - EKG shows heart rate  66, sinus rhythm, QTc 422 with no acute ST changes - Chest x-ray shows emphysema and chronic bronchial thickening with no evidence of pulmonary mass or nodule - Troponin was normal at 3 - Pulmonary hypertension rule out by x-ray as BP improved  -History of  COPD, likely COPD exacerbation   -CTA-emphysematous changes, benign nodule, otherwise within normal limits negative for pulmonary embolism  Hyponatremia - Sodium down to 119 >>> 122>> 126 >>>  - Monitoring - Sodium previously 133 and 125 last year  - Asymptomatic - Status post IV hydration with normal saline -Pursuing SIADH workup (serum osmolality 267 (low), serum sodium 122, urine osmolality 70 (low), urine potassium 5, sodium 21 - Discontinued venlafaxine and hydrochlorothiazide   Hypothyroidism (acquired) - Continue Synthroid at increased dose - Increased dose from 100>> 125 mcg due to elevated  TSH - 5.49 Free T4 1.38 -mildly elevated    Procedures performed: None Disposition: Home Diet recommendation:  Discharge Diet Orders (From admission, onward)     Start     Ordered   03/19/23 0000  Diet - low sodium heart healthy        03/19/23 0923           Regular diet DISCHARGE MEDICATION: Allergies as of 03/19/2023       Reactions   Sulfa Antibiotics Rash        Medication List     STOP taking these medications    dextromethorphan-guaiFENesin 30-600 MG 12hr tablet Commonly known as: MUCINEX DM   esomeprazole 40 MG capsule Commonly known as: NEXIUM   fluticasone 50 MCG/ACT nasal spray Commonly known as: FLONASE   hydrochlorothiazide 25 MG tablet Commonly known as: HYDRODIURIL   nicotine 14 mg/24hr patch Commonly known as: NICODERM CQ - dosed in mg/24 hours Replaced by: nicotine 21 mg/24hr patch   nystatin 100000 UNIT/ML suspension Commonly known as: MYCOSTATIN   venlafaxine XR 150 MG 24 hr capsule Commonly known as: EFFEXOR-XR       TAKE these medications    Advair Diskus 250-50 MCG/ACT Aepb Generic drug: fluticasone-salmeterol Inhale 1 puff into the lungs in the morning and at bedtime.   albuterol 108 (90 Base) MCG/ACT inhaler Commonly known as: VENTOLIN HFA Inhale 2 puffs into the lungs See admin instructions. Inhale 2 puffs every 4  to 6 hours as needed for shortness of breath   alendronate 70 MG tablet Commonly known as: FOSAMAX Take 70 mg by mouth once a week.   ARIPiprazole 10 MG tablet Commonly known as: ABILIFY Take 10 mg by mouth daily. Takes 1/2 daily   aspirin EC 81 MG tablet Take 81 mg by mouth daily. Swallow whole.   atorvastatin 40 MG tablet Commonly known as: LIPITOR Take 40 mg by mouth daily.   azelastine 0.1 % nasal spray Commonly known as: ASTELIN Place 2 sprays into both nostrils 2 (two) times daily.   CALCIUM PO Take 1 tablet by mouth daily.   Cholecalciferol 50 MCG (2000 UT) Tabs Take 1 tablet by mouth daily at 6 (six) AM.   clonazePAM 0.5 MG tablet Commonly known as: KLONOPIN Take 1 mg by mouth 2 (two) times daily.   dorzolamide-timolol 2-0.5 % ophthalmic solution Commonly known as: COSOPT Place 1 drop into the right eye 2 (two) times daily.   ELDERBERRY PO Take by mouth.   labetalol 200 MG tablet Commonly known as: NORMODYNE Take 1 tablet (200 mg total) by mouth 2 (two) times daily.   levocetirizine 5 MG tablet Commonly known as: XYZAL  Take 5 mg by mouth in the morning.   levothyroxine 125 MCG tablet Commonly known as: SYNTHROID Take 1 tablet (125 mcg total) by mouth daily at 6 (six) AM. Start taking on: Mar 20, 2023 What changed:  medication strength how much to take when to take this   lisinopril 20 MG tablet Commonly known as: ZESTRIL Take 1 tablet (20 mg total) by mouth daily.   Melatonin Extra Strength 10 MG Tabs Generic drug: Melatonin Take 1 tablet by mouth at bedtime.   methylPREDNISolone 4 MG Tbpk tablet Commonly known as: MEDROL DOSEPAK Medrol Dosepak take as instructed   montelukast 10 MG tablet Commonly known as: SINGULAIR Take 10 mg by mouth at bedtime.   Multi-Vitamin tablet Take 1 tablet by mouth daily.   nicotine 21 mg/24hr patch Commonly known as: NICODERM CQ - dosed in mg/24 hours Place 1 patch (21 mg total) onto the skin  daily. Start taking on: Mar 20, 2023 Replaces: nicotine 14 mg/24hr patch   zolpidem 10 MG tablet Commonly known as: AMBIEN Take 10 mg by mouth at bedtime as needed for sleep.        Discharge Exam: Filed Weights   03/16/23 1423 03/17/23 0200  Weight: 59 kg 57.2 kg        General:  AAO x 3,  cooperative, no distress;   HEENT:  Normocephalic, PERRL, otherwise with in Normal limits   Neuro:  CNII-XII intact. , normal motor and sensation, reflexes intact   Lungs:   Clear to auscultation BL, Respirations unlabored,  No wheezes / crackles  Cardio:    S1/S2, RRR, No murmure, No Rubs or Gallops   Abdomen:  Soft, non-tender, bowel sounds active all four quadrants, no guarding or peritoneal signs.  Muscular  skeletal:  Limited exam -global generalized weaknesses - in bed, able to move all 4 extremities,   2+ pulses,  symmetric, No pitting edema  Skin:  Dry, warm to touch, negative for any Rashes,  Wounds: Please see nursing documentation         good Condition at discharge:   The results of significant diagnostics from this hospitalization (including imaging, microbiology, ancillary and laboratory) are listed below for reference.   Imaging Studies: CT Angio Chest Pulmonary Embolism (PE) W or WO Contrast  Result Date: 03/17/2023 CLINICAL DATA:  Rule out pulmonary embolism. EXAM: CT ANGIOGRAPHY CHEST WITH CONTRAST TECHNIQUE: Multidetector CT imaging of the chest was performed using the standard protocol during bolus administration of intravenous contrast. Multiplanar CT image reconstructions and MIPs were obtained to evaluate the vascular anatomy. RADIATION DOSE REDUCTION: This exam was performed according to the departmental dose-optimization program which includes automated exposure control, adjustment of the mA and/or kV according to patient size and/or use of iterative reconstruction technique. CONTRAST:  75mL OMNIPAQUE IOHEXOL 350 MG/ML SOLN COMPARISON:  LDCT 12/06/2021 CTA  chest 08/18/2016 FINDINGS: Cardiovascular: Satisfactory opacification of the pulmonary arteries to the segmental level. No evidence of pulmonary embolism. Normal heart size. No pericardial effusion. Aortic atherosclerosis and coronary artery calcifications. Mediastinum/Nodes: No enlarged mediastinal, hilar, or axillary lymph nodes. Thyroid gland, trachea, and esophagus demonstrate no significant findings. Lungs/Pleura: No pleural effusion, airspace consolidation or pneumothorax. Centrilobular emphysema and diffuse bronchial wall thickening. Stable subpleural nodule in the periphery of the right middle lobe which measures 4 mm on today's study. Upper Abdomen: No acute findings identified. 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 Several simple cysts are identified in the left lobe and appears stable  from previous exam. Unchanged bilateral adrenal nodules compatible with benign adenomas. No follow-up imaging recommended. Musculoskeletal: No chest wall abnormality. No acute or significant osseous findings. Review of the MIP images confirms the above findings. IMPRESSION: 1. No evidence for acute pulmonary embolism. 2. Diffuse bronchial wall thickening with emphysema, as above; imaging findings suggestive of underlying COPD. 3. Stable 4 mm right middle lobe lung nodule. This is compatible with a benign nodule. No follow-up imaging recommended. 4. Aortic Atherosclerosis (ICD10-I70.0) and Emphysema (ICD10-J43.9). Electronically Signed   By: Signa Kell M.D.   On: 03/17/2023 10:43   DG Chest 2 View  Result Date: 03/16/2023 CLINICAL DATA:  Wheezing. Hyponatremia. Rule out mass. EXAM: CHEST - 2 VIEW COMPARISON:  Chest radiograph 11/09/2021. CT 12/06/2021 FINDINGS: Emphysema with mild hyperinflation. Chronic bronchial thickening. The heart is normal in size. Aortic atherosclerosis. No evidence of pulmonary mass or nodule by radiograph. No focal airspace  disease. No pleural effusion or pneumothorax. Normal pulmonary vasculature. IMPRESSION: 1. Emphysema and chronic bronchial thickening. 2. No evidence of pulmonary mass or nodule. Electronically Signed   By: Narda Rutherford M.D.   On: 03/16/2023 19:06    Microbiology: Results for orders placed or performed during the hospital encounter of 11/08/21  Resp Panel by RT-PCR (Flu A&B, Covid) Nasopharyngeal Swab     Status: None   Collection Time: 11/08/21  4:04 PM   Specimen: Nasopharyngeal Swab; Nasopharyngeal(NP) swabs in vial transport medium  Result Value Ref Range Status   SARS Coronavirus 2 by RT PCR NEGATIVE NEGATIVE Final    Comment: (NOTE) SARS-CoV-2 target nucleic acids are NOT DETECTED.  The SARS-CoV-2 RNA is generally detectable in upper respiratory specimens during the acute phase of infection. The lowest concentration of SARS-CoV-2 viral copies this assay can detect is 138 copies/mL. A negative result does not preclude SARS-Cov-2 infection and should not be used as the sole basis for treatment or other patient management decisions. A negative result may occur with  improper specimen collection/handling, submission of specimen other than nasopharyngeal swab, presence of viral mutation(s) within the areas targeted by this assay, and inadequate number of viral copies(<138 copies/mL). A negative result must be combined with clinical observations, patient history, and epidemiological information. The expected result is Negative.  Fact Sheet for Patients:  BloggerCourse.com  Fact Sheet for Healthcare Providers:  SeriousBroker.it  This test is no t yet approved or cleared by the [place] FDA and  has been authorized for detection and/or diagnosis of SARS-CoV-2 by FDA under an Emergency Use Authorization (EUA). This EUA will remain  in effect (meaning this test can be used) for the duration of the COVID-19 declaration under  Section 564(b)(1) of the Act, 21 U.S.C.section 360bbb-3(b)(1), unless the authorization is terminated  or revoked sooner.       Influenza A by PCR NEGATIVE NEGATIVE Final   Influenza B by PCR NEGATIVE NEGATIVE Final    Comment: (NOTE) The Xpert Xpress SARS-CoV-2/FLU/RSV plus assay is intended as an aid in the diagnosis of influenza from Nasopharyngeal swab specimens and should not be used as a sole basis for treatment. Nasal washings and aspirates are unacceptable for Xpert Xpress SARS-CoV-2/FLU/RSV testing.  Fact Sheet for Patients: BloggerCourse.com  Fact Sheet for Healthcare Providers: SeriousBroker.it  This test is not yet approved or cleared by the [place] FDA and has been authorized for detection and/or diagnosis of SARS-CoV-2 by FDA under an Emergency Use Authorization (EUA). This EUA will remain in effect (meaning this test can be used) for  the duration of the COVID-19 declaration under Section 564(b)(1) of the Act, 21 U.S.C. section 360bbb-3(b)(1), unless the authorization is terminated or revoked.  Performed at [facility], [address], [place], [place] [address]   Blood Culture (routine x 2)     Status: None   Collection Time: 11/08/21  4:04 PM   Specimen: BLOOD LEFT FOREARM  Result Value Ref Range Status   Specimen Description BLOOD LEFT FOREARM  Final   Special Requests   Final    BOTTLES DRAWN AEROBIC AND ANAEROBIC Blood Culture adequate volume   Culture   Final    NO GROWTH 5 DAYS Performed at [facility], [address] [place], [place] [address]    Report Status 11/13/2021 FINAL  Final  Blood Culture (routine x 2)     Status: None   Collection Time: 11/08/21  4:04 PM   Specimen: Right Antecubital; Blood  Result Value Ref Range Status   Specimen Description RIGHT ANTECUBITAL  Final   Special Requests   Final    BOTTLES DRAWN AEROBIC AND ANAEROBIC Blood Culture adequate volume   Culture   Final     NO GROWTH 5 DAYS Performed at [facility], [address], [place], [place] [address]    Report Status 11/13/2021 FINAL  Final  Urine Culture     Status: None   Collection Time: 11/10/21  6:00 AM   Specimen: In/Out Cath Urine  Result Value Ref Range Status   Specimen Description   Final    IN/OUT CATH URINE Performed at [facility], [address], [place], [place] [address]    Special Requests   Final    NONE Performed at [facility], [address], [place], [place] [address]    Culture   Final    NO GROWTH Performed at [facility], [address] [place], [place] [address]    Report Status 11/12/2021 FINAL  Final    Labs: CBC: Recent Labs  Lab 03/16/23 1609 03/17/23 0434  WBC 8.8 7.8  NEUTROABS 5.7 7.4  HGB 16.4* 14.6  HCT 45.6 40.5  MCV 78.2* 78.5*  PLT 297 270   Basic Metabolic Panel: Recent Labs  Lab 03/17/23 0434 03/17/23 0754 03/17/23 1020 03/17/23 1227 03/17/23 1815 03/18/23 0021 03/19/23 0824  NA 123*   < > 122* 122* 122* 126* 132*  K 3.6  --  3.4* 3.2* 3.5 3.9 3.6  CL 89*  --  88* 87* 88* 95* 100  CO2 26  --  25 24 23 22  20*  GLUCOSE 171*  --  162* 221* 205* 112* 118*  BUN 11  --  10 10 10 9  5*  CREATININE 0.74  --  0.83 0.92 0.94 0.71 0.67  CALCIUM 8.1*  --  8.1* 8.0* 8.1* 8.3* 8.1*  MG 2.0  --   --   --   --   --   --    < > = values in this interval not displayed.   Liver Function Tests: Recent Labs  Lab 03/17/23 0434  AST 18  ALT 18  ALKPHOS 71  BILITOT 0.4  PROT 6.4*  ALBUMIN 3.5   CBG: Recent Labs  Lab 03/17/23 2053 03/18/23 0720 03/18/23 1118 03/18/23 1536 03/19/23 0743  GLUCAP 166* 104* 143* 132* 87    Discharge time spent: Greater than 45 minutes.  Signed:  SIGNED: Kendell Bane, MD, FHM. FAAFP [facility],  Pager (please use [email or web address] to page/text)  Please use Epic Secure  Chat for non-urgent communication (7AM-7PM) If 7PM-7AM, please contact night-coverage [email or web address],  03/19/2023, 9:29  AM  03/19/2023

## 2023-03-19 NOTE — Plan of Care (Signed)
Problem: Education: Goal: Knowledge of disease or condition will improve Outcome: Adequate for Discharge Goal: Knowledge of the prescribed therapeutic regimen will improve Outcome: Adequate for Discharge Goal: Individualized Educational Video(s) Outcome: Adequate for Discharge   Problem: Activity: Goal: Ability to tolerate increased activity will improve Outcome: Adequate for Discharge Goal: Will verbalize the importance of balancing activity with adequate rest periods Outcome: Adequate for Discharge   Problem: Respiratory: Goal: Ability to maintain a clear airway will improve Outcome: Adequate for Discharge Goal: Levels of oxygenation will improve Outcome: Adequate for Discharge Goal: Ability to maintain adequate ventilation will improve Outcome: Adequate for Discharge   Problem: Education: Goal: Knowledge of General Education information will improve Description: Including pain rating scale, medication(s)/side effects and non-pharmacologic comfort measures Outcome: Adequate for Discharge   Problem: Health Behavior/Discharge Planning: Goal: Ability to manage health-related needs will improve Outcome: Adequate for Discharge   Problem: Clinical Measurements: Goal: Ability to maintain clinical measurements within normal limits will improve Outcome: Adequate for Discharge Goal: Will remain free from infection Outcome: Adequate for Discharge Goal: Diagnostic test results will improve Outcome: Adequate for Discharge Goal: Respiratory complications will improve Outcome: Adequate for Discharge Goal: Cardiovascular complication will be avoided Outcome: Adequate for Discharge   Problem: Activity: Goal: Risk for activity intolerance will decrease Outcome: Adequate for Discharge   Problem: Nutrition: Goal: Adequate nutrition will be maintained Outcome: Adequate for Discharge   Problem: Coping: Goal: Level of anxiety will decrease Outcome: Adequate for Discharge    Problem: Elimination: Goal: Will not experience complications related to bowel motility Outcome: Adequate for Discharge Goal: Will not experience complications related to urinary retention Outcome: Adequate for Discharge   Problem: Pain Managment: Goal: General experience of comfort will improve Outcome: Adequate for Discharge   Problem: Safety: Goal: Ability to remain free from injury will improve Outcome: Adequate for Discharge   Problem: Skin Integrity: Goal: Risk for impaired skin integrity will decrease Outcome: Adequate for Discharge   Problem: Education: Goal: Knowledge of disease or condition will improve Outcome: Adequate for Discharge Goal: Knowledge of the prescribed therapeutic regimen will improve Outcome: Adequate for Discharge Goal: Individualized Educational Video(s) Outcome: Adequate for Discharge   Problem: Activity: Goal: Ability to tolerate increased activity will improve Outcome: Adequate for Discharge Goal: Will verbalize the importance of balancing activity with adequate rest periods Outcome: Adequate for Discharge   Problem: Respiratory: Goal: Ability to maintain a clear airway will improve Outcome: Adequate for Discharge Goal: Levels of oxygenation will improve Outcome: Adequate for Discharge Goal: Ability to maintain adequate ventilation will improve Outcome: Adequate for Discharge   Problem: Education: Goal: Knowledge of General Education information will improve Description: Including pain rating scale, medication(s)/side effects and non-pharmacologic comfort measures Outcome: Adequate for Discharge   Problem: Health Behavior/Discharge Planning: Goal: Ability to manage health-related needs will improve Outcome: Adequate for Discharge   Problem: Clinical Measurements: Goal: Ability to maintain clinical measurements within normal limits will improve Outcome: Adequate for Discharge Goal: Will remain free from infection Outcome: Adequate  for Discharge Goal: Diagnostic test results will improve Outcome: Adequate for Discharge Goal: Respiratory complications will improve Outcome: Adequate for Discharge Goal: Cardiovascular complication will be avoided Outcome: Adequate for Discharge   Problem: Activity: Goal: Risk for activity intolerance will decrease Outcome: Adequate for Discharge   Problem: Nutrition: Goal: Adequate nutrition will be maintained Outcome: Adequate for Discharge   Problem: Coping: Goal: Level of anxiety will decrease Outcome: Adequate for Discharge   Problem: Elimination: Goal: Will not experience complications related  to bowel motility Outcome: Adequate for Discharge Goal: Will not experience complications related to urinary retention Outcome: Adequate for Discharge   Problem: Pain Managment: Goal: General experience of comfort will improve Outcome: Adequate for Discharge   Problem: Safety: Goal: Ability to remain free from injury will improve Outcome: Adequate for Discharge   Problem: Skin Integrity: Goal: Risk for impaired skin integrity will decrease Outcome: Adequate for Discharge

## 2023-03-20 LAB — T3, FREE: T3, Free: 2 pg/mL (ref 2.0–4.4)

## 2023-03-20 LAB — HEMOGLOBIN A1C
Hgb A1c MFr Bld: 6 % — ABNORMAL HIGH (ref 4.8–5.6)
Mean Plasma Glucose: 126 mg/dL

## 2023-03-21 ENCOUNTER — Emergency Department (HOSPITAL_COMMUNITY)
Admission: EM | Admit: 2023-03-21 | Discharge: 2023-03-21 | Disposition: A | Discharge status: Home / Self Care | Payer: Medicare HMO | Attending: Emergency Medicine | Admitting: Emergency Medicine

## 2023-03-21 ENCOUNTER — Emergency Department (HOSPITAL_COMMUNITY): Payer: Medicare HMO

## 2023-03-21 ENCOUNTER — Other Ambulatory Visit: Payer: Self-pay

## 2023-03-21 DIAGNOSIS — Z7982 Long term (current) use of aspirin: Secondary | ICD-10-CM | POA: Insufficient documentation

## 2023-03-21 DIAGNOSIS — E039 Hypothyroidism, unspecified: Secondary | ICD-10-CM | POA: Diagnosis not present

## 2023-03-21 DIAGNOSIS — Z79899 Other long term (current) drug therapy: Secondary | ICD-10-CM | POA: Diagnosis not present

## 2023-03-21 DIAGNOSIS — I952 Hypotension due to drugs: Secondary | ICD-10-CM | POA: Diagnosis not present

## 2023-03-21 DIAGNOSIS — I10 Essential (primary) hypertension: Secondary | ICD-10-CM | POA: Insufficient documentation

## 2023-03-21 DIAGNOSIS — E871 Hypo-osmolality and hyponatremia: Secondary | ICD-10-CM | POA: Insufficient documentation

## 2023-03-21 DIAGNOSIS — T448X5A Adverse effect of centrally-acting and adrenergic-neuron-blocking agents, initial encounter: Secondary | ICD-10-CM | POA: Diagnosis not present

## 2023-03-21 DIAGNOSIS — T887XXA Unspecified adverse effect of drug or medicament, initial encounter: Secondary | ICD-10-CM

## 2023-03-21 LAB — RAPID URINE DRUG SCREEN, HOSP PERFORMED
Amphetamines: NOT DETECTED
Barbiturates: NOT DETECTED
Benzodiazepines: NOT DETECTED
Cocaine: NOT DETECTED
Opiates: NOT DETECTED
Tetrahydrocannabinol: NOT DETECTED

## 2023-03-21 LAB — DIFFERENTIAL
Abs Immature Granulocytes: 0.05 10*3/uL (ref 0.00–0.07)
Basophils Absolute: 0 10*3/uL (ref 0.0–0.1)
Basophils Relative: 0 %
Eosinophils Absolute: 0.1 10*3/uL (ref 0.0–0.5)
Eosinophils Relative: 1 %
Immature Granulocytes: 0 %
Lymphocytes Relative: 12 %
Lymphs Abs: 1.6 10*3/uL (ref 0.7–4.0)
Monocytes Absolute: 1.5 10*3/uL — ABNORMAL HIGH (ref 0.1–1.0)
Monocytes Relative: 11 %
Neutro Abs: 10.1 10*3/uL — ABNORMAL HIGH (ref 1.7–7.7)
Neutrophils Relative %: 76 %

## 2023-03-21 LAB — I-STAT CHEM 8, ED
BUN: 15 mg/dL (ref 8–23)
Calcium, Ion: 1.11 mmol/L — ABNORMAL LOW (ref 1.15–1.40)
Chloride: 102 mmol/L (ref 98–111)
Creatinine, Ser: 1 mg/dL (ref 0.44–1.00)
Glucose, Bld: 100 mg/dL — ABNORMAL HIGH (ref 70–99)
HCT: 41 % (ref 36.0–46.0)
Hemoglobin: 13.9 g/dL (ref 12.0–15.0)
Potassium: 5.1 mmol/L (ref 3.5–5.1)
Sodium: 131 mmol/L — ABNORMAL LOW (ref 135–145)
TCO2: 22 mmol/L (ref 22–32)

## 2023-03-21 LAB — URINALYSIS, ROUTINE W REFLEX MICROSCOPIC
Bilirubin Urine: NEGATIVE
Glucose, UA: NEGATIVE mg/dL
Hgb urine dipstick: NEGATIVE
Ketones, ur: NEGATIVE mg/dL
Leukocytes,Ua: NEGATIVE
Nitrite: NEGATIVE
Protein, ur: NEGATIVE mg/dL
Specific Gravity, Urine: 1.003 — ABNORMAL LOW (ref 1.005–1.030)
pH: 8 (ref 5.0–8.0)

## 2023-03-21 LAB — CBC
HCT: 36.9 % (ref 36.0–46.0)
Hemoglobin: 13 g/dL (ref 12.0–15.0)
MCH: 28.1 pg (ref 26.0–34.0)
MCHC: 35.2 g/dL (ref 30.0–36.0)
MCV: 79.9 fL — ABNORMAL LOW (ref 80.0–100.0)
Platelets: 236 10*3/uL (ref 150–400)
RBC: 4.62 MIL/uL (ref 3.87–5.11)
RDW: 15 % (ref 11.5–15.5)
WBC: 13.4 10*3/uL — ABNORMAL HIGH (ref 4.0–10.5)
nRBC: 0 % (ref 0.0–0.2)

## 2023-03-21 LAB — COMPREHENSIVE METABOLIC PANEL
ALT: 24 U/L (ref 0–44)
AST: 16 U/L (ref 15–41)
Albumin: 3.3 g/dL — ABNORMAL LOW (ref 3.5–5.0)
Alkaline Phosphatase: 56 U/L (ref 38–126)
Anion gap: 13 (ref 5–15)
BUN: 17 mg/dL (ref 8–23)
CO2: 17 mmol/L — ABNORMAL LOW (ref 22–32)
Calcium: 8.5 mg/dL — ABNORMAL LOW (ref 8.9–10.3)
Chloride: 98 mmol/L (ref 98–111)
Creatinine, Ser: 1.05 mg/dL — ABNORMAL HIGH (ref 0.44–1.00)
GFR, Estimated: 59 mL/min — ABNORMAL LOW (ref >60)
Glucose, Bld: 101 mg/dL — ABNORMAL HIGH (ref 70–99)
Potassium: 3.3 mmol/L — ABNORMAL LOW (ref 3.5–5.1)
Sodium: 128 mmol/L — ABNORMAL LOW (ref 135–145)
Total Bilirubin: 0.7 mg/dL (ref 0.3–1.2)
Total Protein: 5.8 g/dL — ABNORMAL LOW (ref 6.5–8.1)

## 2023-03-21 LAB — CBG MONITORING, ED: Glucose-Capillary: 94 mg/dL (ref 70–99)

## 2023-03-21 MED ORDER — SODIUM CHLORIDE 0.9 % IV BOLUS
1000.0000 mL | Freq: Once | INTRAVENOUS | Status: AC
  Administered 2023-03-21: 1000 mL via INTRAVENOUS

## 2023-03-21 MED ORDER — LABETALOL HCL 100 MG PO TABS: 100.0000 mg | ORAL_TABLET | Freq: Two times a day (BID) | ORAL | 0 refills | Status: DC

## 2023-03-21 NOTE — Discharge Instructions (Addendum)
I suspect your symptoms today were from 2 possible reasons, your sodium level was again low today but has been replaced with IV fluids given.  Additionally your blood pressures were low .  I believe your labetalol that you started 2 days ago may be too strong for you and is dropping your blood pressure too much.  I would prefer you to take 100 mg twice a day instead of the 200 mg tablet you were prescribed.  Have your provider recheck your sodium level on Monday.

## 2023-03-21 NOTE — ED Provider Notes (Signed)
[place] EMERGENCY DEPARTMENT AT [facility] Provider Note   CSN: [id number] Arrival date & time: 03/21/23  1138     History  Chief Complaint  Patient presents with   Altered Mental Status    Dorothy [name] is a 67 y.o. 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  She lives at home with a sister and a brother.  She states when she woke this morning she felt like her normal self and her siblings did not notice any changes, then around 9 AM about an hour after taking her morning medicines she became confused, stated when asked that "I do not know what to do next" and got up to walk and was very unsteady and needed assistance with walking.  EMS was called, family endorses she now seems to be back at her baseline, she denies any confusion at present, also denies physical complaints at this time however she does endorse having nausea earlier this morning and has had 3 episodes of vomiting since yesterday evening.  She denies abdominal pain no fevers.  She started taking labetalol 200 mg twice a day from her discharge from here 2 days ago where she was treated for hypertension and hyponatremia.  Sister states that when EMS arrived this morning her systolic blood pressure was in the 80s.   The history is provided by the patient.       Home Medications Prior to Admission medications   Medication Sig Start Date End Date Taking? Authorizing Provider  acetaZOLAMIDE ER (DIAMOX) 500 MG capsule Take 1 capsule by mouth 2 (two) times daily. 03/20/23  Yes [provider]  ADVAIR DISKUS 250-50 MCG/ACT AEPB Inhale 1 puff into the lungs in the morning and at bedtime. 03/19/23 06/17/23 Yes Shahmehdi, Seyed A, MD  albuterol (VENTOLIN HFA)  108 (90 Base) MCG/ACT inhaler Inhale 2 puffs into the lungs See admin instructions. Inhale 2 puffs every 4 to 6 hours as needed for shortness of breath 03/19/23 04/18/23 Yes Shahmehdi, Seyed A, MD  alendronate (FOSAMAX) 70 MG tablet Take 70 mg by mouth once a week.   Yes [provider]  ARIPiprazole (ABILIFY) 10 MG tablet Take 10 mg by mouth daily. Takes 1/2 daily 02/16/17  Yes [provider]  aspirin EC 81 MG tablet Take 81 mg by mouth daily. Swallow whole.   Yes [provider]  atorvastatin (LIPITOR) 40 MG tablet Take 40 mg by mouth daily. 06/19/22  Yes [provider]  azelastine (ASTELIN) 0.1 % nasal spray Place 2 sprays into both nostrils 2 (two) times daily. 02/07/23  Yes [provider]  CALCIUM PO Take 3 tablets by mouth daily.   Yes [provider]  Cholecalciferol 50 MCG (2000 UT) TABS Take 1 tablet by mouth daily at 6 (six) AM.   Yes [provider]  clonazePAM (KLONOPIN) 0.5 MG tablet Take 1 mg by mouth 2 (two) times daily.   Yes [provider]  dorzolamide-timolol (COSOPT) 22.3-6.8 MG/ML ophthalmic solution Place 1 drop into the right eye 2 (two) times daily.   Yes [provider]  ELDERBERRY PO Take 3 tablets by mouth daily.   Yes [provider]  labetalol (NORMODYNE) 100 MG tablet Take 1 tablet (100 mg total) by mouth 2 (two) times daily. 03/21/23  Yes Taray Normoyle, Raynelle Fanning, PA-C  latanoprost Harrel Lemon)  0.005 % ophthalmic solution Place 1 drop into the right eye at bedtime. 03/20/23 03/19/24 Yes [provider]  levocetirizine (XYZAL) 5 MG tablet Take 5 mg by mouth in the morning.   Yes [provider]  levothyroxine (SYNTHROID) 125 MCG tablet Take 1 tablet (125 mcg total) by mouth daily at 6 (six) AM. 03/20/23 05/19/23 Yes Shahmehdi, Seyed A, MD  lisinopril (ZESTRIL) 20 MG tablet Take 1 tablet (20 mg total) by mouth daily. 03/19/23  Yes Shahmehdi, Gemma Payor, MD  Melatonin (MELATONIN EXTRA  STRENGTH) 10 MG TABS Take 1 tablet by mouth at bedtime.   Yes [provider]  methylPREDNISolone (MEDROL DOSEPAK) 4 MG TBPK tablet Medrol Dosepak take as instructed 03/19/23  Yes Shahmehdi, Seyed A, MD  montelukast (SINGULAIR) 10 MG tablet Take 10 mg by mouth at bedtime. 01/03/23  Yes [provider]  Multiple Vitamin (MULTI-VITAMIN) tablet Take 2 tablets by mouth daily.   Yes [provider]  nicotine (NICODERM CQ - DOSED IN MG/24 HOURS) 21 mg/24hr patch Place 1 patch (21 mg total) onto the skin daily. 03/20/23  Yes Shahmehdi, Seyed A, MD  zolpidem (AMBIEN) 10 MG tablet Take 10 mg by mouth at bedtime as needed for sleep. 02/04/19  Yes [provider]      Allergies    Sulfa antibiotics    Review of Systems   Review of Systems  Constitutional:  Negative for chills and fever.  HENT:  Negative for congestion and sore throat.   Eyes: Negative.   Respiratory:  Negative for chest tightness and shortness of breath.   Cardiovascular:  Negative for chest pain.  Gastrointestinal:  Positive for nausea and vomiting. Negative for abdominal pain.       Nausea and vomiting are now resolved  Genitourinary: Negative.   Musculoskeletal:  Negative for arthralgias, joint swelling and neck pain.  Skin: Negative.  Negative for rash and wound.  Neurological:  Positive for weakness. Negative for dizziness, light-headedness, numbness and headaches.  Psychiatric/Behavioral:  Positive for confusion.   All other systems reviewed and are negative.   Physical Exam Updated Vital Signs BP (!) 124/98   Pulse (!) 57   Temp 98 F (36.7 C) (Oral)   Resp 19   Ht 5\' 7"  (1.702 m)   Wt 57.2 kg   LMP 10/23/1980 Comment: Hyst age 81(ovaries retained)  SpO2 100%   BMI 19.75 kg/m  Physical Exam Vitals and nursing note reviewed.  Constitutional:      Appearance: She is well-developed.  HENT:     Head: Normocephalic and atraumatic.  Eyes:     Conjunctiva/sclera: Conjunctivae  normal.  Cardiovascular:     Rate and Rhythm: Normal rate and regular rhythm.     Heart sounds: Normal heart sounds.  Pulmonary:     Effort: Pulmonary effort is normal.     Breath sounds: Normal breath sounds. No wheezing.  Abdominal:     General: Bowel sounds are normal.     Palpations: Abdomen is soft.     Tenderness: There is no abdominal tenderness.  Musculoskeletal:        General: Normal range of motion.     Cervical back: Normal range of motion.  Skin:    General: Skin is warm and dry.  Neurological:     General: No focal deficit present.     Mental Status: She is alert and oriented to person, place, and time.  Psychiatric:        Mood and Affect: Mood  is anxious.     ED Results / Procedures / Treatments   Labs (all labs ordered are listed, but only abnormal results are displayed) Labs Reviewed  COMPREHENSIVE METABOLIC PANEL - Abnormal; Notable for the following components:      Result Value   Sodium 128 (*)    Potassium 3.3 (*)    CO2 17 (*)    Glucose, Bld 101 (*)    Creatinine, Ser 1.05 (*)    Calcium 8.5 (*)    Total Protein 5.8 (*)    Albumin 3.3 (*)    GFR, Estimated 59 (*)    All other components within normal limits  CBC - Abnormal; Notable for the following components:   WBC 13.4 (*)    MCV 79.9 (*)    All other components within normal limits  URINALYSIS, ROUTINE W REFLEX MICROSCOPIC - Abnormal; Notable for the following components:   Color, Urine STRAW (*)    Specific Gravity, Urine 1.003 (*)    All other components within normal limits  DIFFERENTIAL - Abnormal; Notable for the following components:   Neutro Abs 10.1 (*)    Monocytes Absolute 1.5 (*)    All other components within normal limits  I-STAT CHEM 8, ED - Abnormal; Notable for the following components:   Sodium 131 (*)    Glucose, Bld 100 (*)    Calcium, Ion 1.11 (*)    All other components within normal limits  RAPID URINE DRUG SCREEN, HOSP PERFORMED  CBG MONITORING, ED     EKG None ED ECG REPORT   Date: 03/21/2023  Rate: 61  Rhythm: normal sinus rhythm  QRS Axis: normal  Intervals: normal  ST/T Wave abnormalities: normal  Conduction Disutrbances:none  Narrative Interpretation:   Old EKG Reviewed: unchanged  I have personally reviewed the EKG tracing and agree with the computerized printout as noted.  Radiology CT HEAD WO CONTRAST  Result Date: 03/21/2023 CLINICAL DATA:  Altered mental status EXAM: CT HEAD WITHOUT CONTRAST TECHNIQUE: Contiguous axial images were obtained from the base of the skull through the vertex without intravenous contrast. RADIATION DOSE REDUCTION: This exam was performed according to the departmental dose-optimization program which includes automated exposure control, adjustment of the mA and/or kV according to patient size and/or use of iterative reconstruction technique. COMPARISON:  CT Head 01/24/22 FINDINGS: Brain: Chronic right occipital lobe infarct, unchanged from prior exam. No CT evidence of a new cortical infarct. No hemorrhage. No hydrocephalus. No extra-axial fluid collection. Vascular: No hyperdense vessel or unexpected calcification. Skull: Normal. Negative for fracture or focal lesion. Sinuses/Orbits: No middle ear or mastoid effusion. Paranasal sinuses are clear. Bilateral lens replacement. Orbits are otherwise unremarkable. Other: None. IMPRESSION: 1. No acute intracranial abnormality. 2. Chronic right occipital lobe infarct, unchanged from prior exam. Electronically Signed   By: Lorenza Cambridge M.D.   On: 03/21/2023 15:59    Procedures Procedures    Medications Ordered in ED Medications  sodium chloride 0.9 % bolus 1,000 mL (0 mLs Intravenous Stopped 03/21/23 1829)    ED Course/ Medical Decision Making/ A&P                             Medical Decision Making Patient presenting with weakness, lightheadedness prior to arrival with reported low blood pressure when EMS arrived.  She had just started taking  labetalol 200 mg yesterday after she was discharged from the hospital 2 days ago where she was treated for hyponatremia  and hypertension.  She again had hyponatremia today at 128, she was given a bolus of normal saline after which this improved to 131.  As she has been here her blood pressures have gradually improved.  She was noted to be a little bit bradycardic as well with a pulse of 57.  I suspect 200 mg labetalol may be too big of a dose for her, she was asked to cut this back to 100 mg twice a day and was given a new prescription for this.  As I am not sure whether the her 200 mg tablets can be cut in half.  Patient states she feels much better after getting IV fluids she was ambulatory without assistance.  Family is here to take her home.  She does have follow-up with her primary provider in 5 days.  She was advised to keep a watch on her blood pressures and to have her sodium level rechecked on Monday.  Amount and/or Complexity of Data Reviewed Labs: ordered.    Details: Per above. Radiology: ordered.    Details: CT head was performed as there is initially concern about confusion this morning, however upon further discussion with family it appears she was simply weak and lightheaded.  CT head is negative. ECG/medicine tests: ordered.  Risk Prescription drug management.           Final Clinical Impression(s) / ED Diagnoses Final diagnoses:  Hyponatremia  Hypotension due to drugs  Medication side effect    Rx / DC Orders ED Discharge Orders          Ordered    labetalol (NORMODYNE) 100 MG tablet  2 times daily        03/21/23 1829              Burgess Amor, Cordelia Poche 03/21/23 1840    Bethann Berkshire, MD 03/25/23 1341

## 2023-03-21 NOTE — ED Notes (Signed)
Sister just called and stated pt was fine until she took her medications this morning. Sister : Kathyrn Lass [phone]

## 2023-03-21 NOTE — ED Triage Notes (Signed)
Pt arrived [facility] with c/o AMS from family. Family stated pt was discharged recently and she has become more confused per [facility].

## 2023-03-21 NOTE — ED Notes (Signed)
Provider notified of O2 sat 88-91% on room air, directed to walk pt and re check,  walked pt, pt denies shortness of breath, pt O2 sat after walking was 91% on room air, PA notified, Ok for discharge, went to pt's room to d/c pt, pt satting 98% on room air, pt verbalized understanding d/c and follow up, family coming to get pt.

## 2023-04-10 ENCOUNTER — Other Ambulatory Visit: Payer: Self-pay

## 2023-04-10 ENCOUNTER — Emergency Department (HOSPITAL_COMMUNITY): Payer: Medicare HMO

## 2023-04-10 ENCOUNTER — Encounter (HOSPITAL_COMMUNITY): Payer: Self-pay | Admitting: Internal Medicine

## 2023-04-10 ENCOUNTER — Inpatient Hospital Stay (HOSPITAL_COMMUNITY)
Admission: EM | Admit: 2023-04-10 | Discharge: 2023-04-12 | DRG: 191 | Disposition: A | Discharge status: Home / Self Care | Payer: Medicare HMO | Attending: Internal Medicine | Admitting: Internal Medicine

## 2023-04-10 DIAGNOSIS — E7800 Pure hypercholesterolemia, unspecified: Secondary | ICD-10-CM | POA: Diagnosis present

## 2023-04-10 DIAGNOSIS — J441 Chronic obstructive pulmonary disease with (acute) exacerbation: Secondary | ICD-10-CM | POA: Diagnosis present

## 2023-04-10 DIAGNOSIS — J471 Bronchiectasis with (acute) exacerbation: Secondary | ICD-10-CM

## 2023-04-10 DIAGNOSIS — K219 Gastro-esophageal reflux disease without esophagitis: Secondary | ICD-10-CM | POA: Diagnosis present

## 2023-04-10 DIAGNOSIS — Z96642 Presence of left artificial hip joint: Secondary | ICD-10-CM | POA: Diagnosis present

## 2023-04-10 DIAGNOSIS — Z90710 Acquired absence of both cervix and uterus: Secondary | ICD-10-CM

## 2023-04-10 DIAGNOSIS — M810 Age-related osteoporosis without current pathological fracture: Secondary | ICD-10-CM | POA: Diagnosis present

## 2023-04-10 DIAGNOSIS — Z8041 Family history of malignant neoplasm of ovary: Secondary | ICD-10-CM

## 2023-04-10 DIAGNOSIS — I10 Essential (primary) hypertension: Secondary | ICD-10-CM | POA: Diagnosis present

## 2023-04-10 DIAGNOSIS — Z1152 Encounter for screening for COVID-19: Secondary | ICD-10-CM | POA: Diagnosis not present

## 2023-04-10 DIAGNOSIS — Z79899 Other long term (current) drug therapy: Secondary | ICD-10-CM

## 2023-04-10 DIAGNOSIS — Z79890 Hormone replacement therapy (postmenopausal): Secondary | ICD-10-CM

## 2023-04-10 DIAGNOSIS — Z8249 Family history of ischemic heart disease and other diseases of the circulatory system: Secondary | ICD-10-CM | POA: Diagnosis not present

## 2023-04-10 DIAGNOSIS — Z7951 Long term (current) use of inhaled steroids: Secondary | ICD-10-CM | POA: Diagnosis not present

## 2023-04-10 DIAGNOSIS — E039 Hypothyroidism, unspecified: Secondary | ICD-10-CM | POA: Diagnosis present

## 2023-04-10 DIAGNOSIS — R509 Fever, unspecified: Secondary | ICD-10-CM

## 2023-04-10 DIAGNOSIS — J9611 Chronic respiratory failure with hypoxia: Secondary | ICD-10-CM | POA: Diagnosis present

## 2023-04-10 DIAGNOSIS — R7989 Other specified abnormal findings of blood chemistry: Secondary | ICD-10-CM

## 2023-04-10 DIAGNOSIS — R0609 Other forms of dyspnea: Secondary | ICD-10-CM | POA: Diagnosis not present

## 2023-04-10 DIAGNOSIS — Z803 Family history of malignant neoplasm of breast: Secondary | ICD-10-CM

## 2023-04-10 DIAGNOSIS — F419 Anxiety disorder, unspecified: Secondary | ICD-10-CM | POA: Diagnosis present

## 2023-04-10 DIAGNOSIS — I2489 Other forms of acute ischemic heart disease: Secondary | ICD-10-CM | POA: Diagnosis present

## 2023-04-10 DIAGNOSIS — E872 Acidosis, unspecified: Secondary | ICD-10-CM | POA: Diagnosis present

## 2023-04-10 DIAGNOSIS — E871 Hypo-osmolality and hyponatremia: Secondary | ICD-10-CM | POA: Diagnosis present

## 2023-04-10 DIAGNOSIS — Z8541 Personal history of malignant neoplasm of cervix uteri: Secondary | ICD-10-CM | POA: Diagnosis not present

## 2023-04-10 DIAGNOSIS — Z7982 Long term (current) use of aspirin: Secondary | ICD-10-CM

## 2023-04-10 DIAGNOSIS — D72829 Elevated white blood cell count, unspecified: Secondary | ICD-10-CM | POA: Diagnosis present

## 2023-04-10 DIAGNOSIS — F32A Depression, unspecified: Secondary | ICD-10-CM | POA: Diagnosis present

## 2023-04-10 DIAGNOSIS — Z9981 Dependence on supplemental oxygen: Secondary | ICD-10-CM

## 2023-04-10 DIAGNOSIS — F17210 Nicotine dependence, cigarettes, uncomplicated: Secondary | ICD-10-CM | POA: Diagnosis present

## 2023-04-10 DIAGNOSIS — Z7983 Long term (current) use of bisphosphonates: Secondary | ICD-10-CM

## 2023-04-10 LAB — CBC WITH DIFFERENTIAL/PLATELET
Abs Immature Granulocytes: 0.08 K/uL — ABNORMAL HIGH (ref 0.00–0.07)
Basophils Absolute: 0 K/uL (ref 0.0–0.1)
Basophils Relative: 0 %
Eosinophils Absolute: 0 K/uL (ref 0.0–0.5)
Eosinophils Relative: 0 %
HCT: 39.4 % (ref 36.0–46.0)
Hemoglobin: 13.7 g/dL (ref 12.0–15.0)
Immature Granulocytes: 1 %
Lymphocytes Relative: 4 %
Lymphs Abs: 0.5 K/uL — ABNORMAL LOW (ref 0.7–4.0)
MCH: 28 pg (ref 26.0–34.0)
MCHC: 34.8 g/dL (ref 30.0–36.0)
MCV: 80.4 fL (ref 80.0–100.0)
Monocytes Absolute: 1.4 K/uL — ABNORMAL HIGH (ref 0.1–1.0)
Monocytes Relative: 12 %
Neutro Abs: 10.2 K/uL — ABNORMAL HIGH (ref 1.7–7.7)
Neutrophils Relative %: 83 %
Platelets: 218 K/uL (ref 150–400)
RBC: 4.9 MIL/uL (ref 3.87–5.11)
RDW: 17 % — ABNORMAL HIGH (ref 11.5–15.5)
WBC: 12.2 K/uL — ABNORMAL HIGH (ref 4.0–10.5)
nRBC: 0 % (ref 0.0–0.2)

## 2023-04-10 LAB — COMPREHENSIVE METABOLIC PANEL
ALT: 25 U/L (ref 0–44)
AST: 17 U/L (ref 15–41)
Albumin: 3.8 g/dL (ref 3.5–5.0)
Alkaline Phosphatase: 55 U/L (ref 38–126)
Anion gap: 8 (ref 5–15)
BUN: 14 mg/dL (ref 8–23)
CO2: 17 mmol/L — ABNORMAL LOW (ref 22–32)
Calcium: 8 mg/dL — ABNORMAL LOW (ref 8.9–10.3)
Chloride: 103 mmol/L (ref 98–111)
Creatinine, Ser: 0.78 mg/dL (ref 0.44–1.00)
GFR, Estimated: >60 mL/min (ref >60)
Glucose, Bld: 104 mg/dL — ABNORMAL HIGH (ref 70–99)
Potassium: 3.8 mmol/L (ref 3.5–5.1)
Sodium: 128 mmol/L — ABNORMAL LOW (ref 135–145)
Total Bilirubin: 0.9 mg/dL (ref 0.3–1.2)
Total Protein: 6.7 g/dL (ref 6.5–8.1)

## 2023-04-10 LAB — HEPARIN LEVEL (UNFRACTIONATED)
Heparin Unfractionated: 0.16 IU/mL — ABNORMAL LOW (ref 0.30–0.70)
Heparin Unfractionated: 0.28 IU/mL — ABNORMAL LOW (ref 0.30–0.70)

## 2023-04-10 LAB — URINALYSIS, W/ REFLEX TO CULTURE (INFECTION SUSPECTED)
Bilirubin Urine: NEGATIVE
Glucose, UA: NEGATIVE mg/dL
Ketones, ur: NEGATIVE mg/dL
Leukocytes,Ua: NEGATIVE
Nitrite: NEGATIVE
Protein, ur: 30 mg/dL — AB
Specific Gravity, Urine: 1.012 (ref 1.005–1.030)
pH: 5 (ref 5.0–8.0)

## 2023-04-10 LAB — TROPONIN I (HIGH SENSITIVITY)
Troponin I (High Sensitivity): 81 ng/L — ABNORMAL HIGH (ref <18)
Troponin I (High Sensitivity): 168 ng/L
Troponin I (High Sensitivity): 177 ng/L (ref <18)
Troponin I (High Sensitivity): 147 ng/L (ref <18)

## 2023-04-10 LAB — BLOOD GAS, ARTERIAL
Acid-base deficit: 9 mmol/L — ABNORMAL HIGH (ref 0.0–2.0)
Bicarbonate: 17.5 mmol/L — ABNORMAL LOW (ref 20.0–28.0)
Drawn by: 22223
FIO2: 60 %
O2 Saturation: 99.8 %
Patient temperature: 37
pCO2 arterial: 39 mmHg (ref 32–48)
pH, Arterial: 7.26 — ABNORMAL LOW (ref 7.35–7.45)
pO2, Arterial: 168 mmHg — ABNORMAL HIGH (ref 83–108)

## 2023-04-10 LAB — RESP PANEL BY RT-PCR (RSV, FLU A&B, COVID)  RVPGX2
Influenza A by PCR: NEGATIVE
Influenza B by PCR: NEGATIVE
Resp Syncytial Virus by PCR: NEGATIVE
SARS Coronavirus 2 by RT PCR: NEGATIVE

## 2023-04-10 LAB — PROCALCITONIN: Procalcitonin: <0.1 ng/mL

## 2023-04-10 LAB — LACTIC ACID, PLASMA: Lactic Acid, Venous: 1.1 mmol/L (ref 0.5–1.9)

## 2023-04-10 LAB — CULTURE, BLOOD (ROUTINE X 2)

## 2023-04-10 LAB — PROTIME-INR
INR: 1 (ref 0.8–1.2)
Prothrombin Time: 13.2 s (ref 11.4–15.2)

## 2023-04-10 LAB — APTT: aPTT: 29 seconds (ref 24–36)

## 2023-04-10 LAB — BRAIN NATRIURETIC PEPTIDE: B Natriuretic Peptide: 37 pg/mL (ref 0.0–100.0)

## 2023-04-10 MED ORDER — LORATADINE 10 MG PO TABS
10.0000 mg | ORAL_TABLET | Freq: Every day | ORAL | Status: DC
  Administered 2023-04-11 – 2023-04-12 (×2): 10 mg via ORAL
  Filled 2023-04-10 (×2): qty 1

## 2023-04-10 MED ORDER — LATANOPROST 0.005 % OP SOLN
1.0000 [drp] | Freq: Every day | OPHTHALMIC | Status: DC
  Administered 2023-04-10 – 2023-04-11 (×2): 1 [drp] via OPHTHALMIC
  Filled 2023-04-10: qty 2.5

## 2023-04-10 MED ORDER — ONDANSETRON HCL 4 MG PO TABS: 4.0000 mg | ORAL_TABLET | Freq: Four times a day (QID) | ORAL | Status: DC | PRN

## 2023-04-10 MED ORDER — ADULT MULTIVITAMIN W/MINERALS CH
2.0000 | ORAL_TABLET | Freq: Every day | ORAL | Status: DC
  Administered 2023-04-11 – 2023-04-12 (×2): 2 via ORAL
  Filled 2023-04-10 (×2): qty 2

## 2023-04-10 MED ORDER — SODIUM CHLORIDE 0.9 % IV SOLN
500.0000 mg | INTRAVENOUS | Status: DC
  Administered 2023-04-10 – 2023-04-12 (×3): 500 mg via INTRAVENOUS
  Filled 2023-04-10 (×3): qty 5

## 2023-04-10 MED ORDER — LEVOTHYROXINE SODIUM 50 MCG PO TABS
125.0000 ug | ORAL_TABLET | Freq: Every day | ORAL | Status: DC
  Administered 2023-04-11 – 2023-04-12 (×2): 125 ug via ORAL
  Filled 2023-04-10 (×2): qty 3

## 2023-04-10 MED ORDER — ZOLPIDEM TARTRATE 5 MG PO TABS: 5.0000 mg | ORAL_TABLET | Freq: Every evening | ORAL | Status: DC | PRN

## 2023-04-10 MED ORDER — HEPARIN BOLUS VIA INFUSION
3400.0000 [IU] | Freq: Once | INTRAVENOUS | Status: AC
  Administered 2023-04-10: 3400 [IU] via INTRAVENOUS

## 2023-04-10 MED ORDER — BUDESONIDE 0.25 MG/2ML IN SUSP
0.2500 mg | Freq: Two times a day (BID) | RESPIRATORY_TRACT | Status: DC
  Administered 2023-04-10 – 2023-04-12 (×5): 0.25 mg via RESPIRATORY_TRACT
  Filled 2023-04-10 (×5): qty 2

## 2023-04-10 MED ORDER — ACETAMINOPHEN 325 MG PO TABS: 650.0000 mg | ORAL_TABLET | Freq: Four times a day (QID) | ORAL | Status: DC | PRN

## 2023-04-10 MED ORDER — NICOTINE 21 MG/24HR TD PT24
21.0000 mg | MEDICATED_PATCH | Freq: Every day | TRANSDERMAL | Status: DC
  Administered 2023-04-10 – 2023-04-12 (×3): 21 mg via TRANSDERMAL
  Filled 2023-04-10 (×2): qty 1

## 2023-04-10 MED ORDER — ACETAZOLAMIDE ER 500 MG PO CP12
500.0000 mg | ORAL_CAPSULE | Freq: Two times a day (BID) | ORAL | Status: DC
  Administered 2023-04-10 – 2023-04-12 (×4): 500 mg via ORAL
  Filled 2023-04-10 (×6): qty 1

## 2023-04-10 MED ORDER — LISINOPRIL 10 MG PO TABS
20.0000 mg | ORAL_TABLET | Freq: Every day | ORAL | Status: DC
  Administered 2023-04-11: 20 mg via ORAL
  Filled 2023-04-10: qty 2

## 2023-04-10 MED ORDER — ARFORMOTEROL TARTRATE 15 MCG/2ML IN NEBU
15.0000 ug | INHALATION_SOLUTION | Freq: Two times a day (BID) | RESPIRATORY_TRACT | Status: DC
  Administered 2023-04-10 – 2023-04-12 (×5): 15 ug via RESPIRATORY_TRACT
  Filled 2023-04-10 (×5): qty 2

## 2023-04-10 MED ORDER — MONTELUKAST SODIUM 10 MG PO TABS
10.0000 mg | ORAL_TABLET | Freq: Every day | ORAL | Status: DC
  Administered 2023-04-10 – 2023-04-11 (×2): 10 mg via ORAL
  Filled 2023-04-10 (×2): qty 1

## 2023-04-10 MED ORDER — CLONAZEPAM 0.5 MG PO TABS
0.5000 mg | ORAL_TABLET | Freq: Two times a day (BID) | ORAL | Status: DC
  Administered 2023-04-10 – 2023-04-12 (×4): 0.5 mg via ORAL
  Filled 2023-04-10 (×4): qty 1

## 2023-04-10 MED ORDER — LEVOCETIRIZINE DIHYDROCHLORIDE 5 MG PO TABS: 5.0000 mg | ORAL_TABLET | Freq: Every morning | ORAL | Status: DC

## 2023-04-10 MED ORDER — MELATONIN 3 MG PO TABS
3.0000 mg | ORAL_TABLET | Freq: Every day | ORAL | Status: DC
  Administered 2023-04-10 – 2023-04-11 (×2): 3 mg via ORAL
  Filled 2023-04-10 (×2): qty 1

## 2023-04-10 MED ORDER — HEPARIN BOLUS VIA INFUSION
850.0000 [IU] | Freq: Once | INTRAVENOUS | Status: AC
  Administered 2023-04-10: 850 [IU] via INTRAVENOUS
  Filled 2023-04-10: qty 850

## 2023-04-10 MED ORDER — ONDANSETRON HCL 4 MG/2ML IJ SOLN: 4.0000 mg | Freq: Four times a day (QID) | INTRAMUSCULAR | Status: DC | PRN

## 2023-04-10 MED ORDER — SODIUM CHLORIDE 0.9 % IV SOLN
2.0000 g | INTRAVENOUS | Status: DC
  Administered 2023-04-10: 2 g via INTRAVENOUS
  Filled 2023-04-10: qty 20

## 2023-04-10 MED ORDER — ACETAMINOPHEN 650 MG RE SUPP: 650.0000 mg | Freq: Four times a day (QID) | RECTAL | Status: DC | PRN

## 2023-04-10 MED ORDER — ALBUTEROL SULFATE (2.5 MG/3ML) 0.083% IN NEBU
10.0000 mg/h | INHALATION_SOLUTION | Freq: Once | RESPIRATORY_TRACT | Status: AC
  Administered 2023-04-10: 10 mg/h via RESPIRATORY_TRACT

## 2023-04-10 MED ORDER — HEPARIN (PORCINE) 25000 UT/250ML-% IV SOLN
1100.0000 [IU]/h | INTRAVENOUS | Status: DC
  Administered 2023-04-10: 650 [IU]/h via INTRAVENOUS
  Administered 2023-04-11: 1100 [IU]/h via INTRAVENOUS
  Filled 2023-04-10: qty 250

## 2023-04-10 MED ORDER — IPRATROPIUM-ALBUTEROL 0.5-2.5 (3) MG/3ML IN SOLN
3.0000 mL | Freq: Four times a day (QID) | RESPIRATORY_TRACT | Status: DC
  Administered 2023-04-10 – 2023-04-12 (×9): 3 mL via RESPIRATORY_TRACT
  Filled 2023-04-10 (×9): qty 3

## 2023-04-10 MED ORDER — LABETALOL HCL 200 MG PO TABS
100.0000 mg | ORAL_TABLET | Freq: Two times a day (BID) | ORAL | Status: DC
  Administered 2023-04-10 – 2023-04-12 (×4): 100 mg via ORAL
  Filled 2023-04-10 (×4): qty 1

## 2023-04-10 MED ORDER — ATORVASTATIN CALCIUM 40 MG PO TABS
40.0000 mg | ORAL_TABLET | Freq: Every day | ORAL | Status: DC
  Administered 2023-04-11 – 2023-04-12 (×2): 40 mg via ORAL
  Filled 2023-04-10 (×2): qty 1

## 2023-04-10 MED ORDER — ALBUTEROL SULFATE (2.5 MG/3ML) 0.083% IN NEBU
INHALATION_SOLUTION | RESPIRATORY_TRACT | Status: AC
  Filled 2023-04-10: qty 12

## 2023-04-10 MED ORDER — ASPIRIN 81 MG PO TBEC
81.0000 mg | DELAYED_RELEASE_TABLET | Freq: Every day | ORAL | Status: DC
  Administered 2023-04-11 – 2023-04-12 (×2): 81 mg via ORAL
  Filled 2023-04-10 (×2): qty 1

## 2023-04-10 MED ORDER — AZELASTINE HCL 0.1 % NA SOLN
2.0000 | Freq: Two times a day (BID) | NASAL | Status: DC
  Administered 2023-04-10 – 2023-04-12 (×4): 2 via NASAL
  Filled 2023-04-10: qty 30

## 2023-04-10 MED ORDER — ARIPIPRAZOLE 5 MG PO TABS
10.0000 mg | ORAL_TABLET | Freq: Every day | ORAL | Status: DC
  Administered 2023-04-11 – 2023-04-12 (×2): 10 mg via ORAL
  Filled 2023-04-10 (×2): qty 2

## 2023-04-10 MED ORDER — DORZOLAMIDE HCL-TIMOLOL MAL 2-0.5 % OP SOLN
1.0000 [drp] | Freq: Two times a day (BID) | OPHTHALMIC | Status: DC
  Administered 2023-04-10 – 2023-04-12 (×4): 1 [drp] via OPHTHALMIC
  Filled 2023-04-10: qty 10

## 2023-04-10 MED ORDER — HEPARIN BOLUS VIA INFUSION
1500.0000 [IU] | Freq: Once | INTRAVENOUS | Status: AC
  Administered 2023-04-10: 1500 [IU] via INTRAVENOUS
  Filled 2023-04-10: qty 1500

## 2023-04-10 MED ORDER — METHYLPREDNISOLONE SODIUM SUCC 125 MG IJ SOLR
120.0000 mg | INTRAMUSCULAR | Status: DC
  Administered 2023-04-10 – 2023-04-12 (×3): 120 mg via INTRAVENOUS
  Filled 2023-04-10 (×3): qty 2

## 2023-04-10 NOTE — Progress Notes (Signed)
ANTICOAGULATION CONSULT NOTE - Initial Consult  Pharmacy Consult for heparin Indication: chest pain/ACS  Allergies  Allergen Reactions   Sulfa Antibiotics Rash    Patient Measurements: Height: 5\' 7"  (170.2 cm) Weight: 57.3 kg (126 lb 5.2 oz) IBW/kg (Calculated) : 61.6 Heparin Dosing Weight: 57 kg   Vital Signs: Temp: 97.8 F (36.6 C) (06/18 0736) Temp Source: Oral (06/18 0339) BP: 140/71 (06/18 0545) Pulse Rate: 87 (06/18 0640)  Labs: Recent Labs    04/10/23 0431 04/10/23 0630  HGB 13.7  --   HCT 39.4  --   PLT 218  --   APTT 29  --   LABPROT 13.2  --   INR 1.0  --   CREATININE 0.78  --   TROPONINIHS 81* 168*    Estimated Creatinine Clearance: 63.4 mL/min (by C-G formula based on SCr of 0.78 mg/dL).   Medical History: 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   Benign essential HTN 01/17/2012   Cancer (HCC) 1982   ovarian   Cancer of cervix (HCC)    Constipation    Depression    Takes Klonopin   Depression with anxiety 01/17/2012   GERD (gastroesophageal reflux disease)    Hypercholesteremia    Hypertension    takes meds   Hypothyroidism    on Levonthyroxine   Hypothyroidism (acquired) 01/17/2012   Since age 22   Low sodium levels 09/25/2022   Na level 125 at [facility]   Osteopenia 2018   forearm   Osteoporosis 07/25/2021   forearm   Ovarian cyst 01/17/2012   1995  BSO   Personal history of colonic polyps 11/07/2007   Pneumonia    x 2- last time > 5 years ago   Pneumonia 01/17/2012   10 yrs ago and 5 years ago    Rash and other nonspecific skin eruption 01/17/2012   Dorsum hands & extensor surfaces arms; intermittent    Medications:  (Not in a hospital admission)   Assessment: Pharmacy consulted to dose heparin in patient with chest pain/ACS. Patient is not on anticoagulation prior to admission.  CBC WNL Trop  168  Goal of Therapy:  Heparin level 0.3-0.7 units/ml Monitor platelets by anticoagulation protocol: Yes   Plan:  Give 3400 units bolus x 1 Start heparin infusion at 650 units/hr Check anti-Xa level in 6-8 hours and daily while on heparin Continue to monitor H&H and platelets  Judeth Cornfield, PharmD Clinical Pharmacist 04/10/2023 7:59 AM

## 2023-04-10 NOTE — Progress Notes (Signed)
ANTICOAGULATION CONSULT NOTE -   Pharmacy Consult for heparin Indication: chest pain/ACS  Allergies  Allergen Reactions   Sulfa Antibiotics Rash    Patient Measurements: Height: 5\' 7"  (170.2 cm) Weight: 56.2 kg (123 lb 14.4 oz) IBW/kg (Calculated) : 61.6 Heparin Dosing Weight: 57 kg   Vital Signs: Temp: 97.8 F (36.6 C) (06/18 1158) Temp Source: Oral (06/18 1158) BP: 139/87 (06/18 1158) Pulse Rate: 83 (06/18 1158)  Labs: Recent Labs    04/10/23 0431 04/10/23 0630 04/10/23 0836 04/10/23 0951 04/10/23 1356  HGB 13.7  --   --   --   --   HCT 39.4  --   --   --   --   PLT 218  --   --   --   --   APTT 29  --   --   --   --   LABPROT 13.2  --   --   --   --   INR 1.0  --   --   --   --   HEPARINUNFRC  --   --   --   --  0.16*  CREATININE 0.78  --   --   --   --   TROPONINIHS 81* 168* 177* 147*  --      Estimated Creatinine Clearance: 62.2 mL/min (by C-G formula based on SCr of 0.78 mg/dL).   Medical History: 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   Benign essential HTN 01/17/2012   Cancer (HCC) 1982   ovarian   Cancer of cervix (HCC)    Constipation    Depression    Takes Klonopin   Depression with anxiety 01/17/2012   GERD (gastroesophageal reflux disease)    Hypercholesteremia    Hypertension    takes meds   Hypothyroidism    on Levonthyroxine   Hypothyroidism (acquired) 01/17/2012   Since age 89   Low sodium levels 09/25/2022   Na level 125 at [facility]   Osteopenia 2018   forearm   Osteoporosis 07/25/2021   forearm   Ovarian cyst 01/17/2012   1995  BSO   Personal history of colonic polyps 11/07/2007   Pneumonia    x 2- last time > 5 years ago   Pneumonia 01/17/2012   10 yrs ago and 5 years ago    Rash and other nonspecific skin eruption 01/17/2012   Dorsum hands & extensor surfaces arms; intermittent     Medications:  Medications Prior to Admission  Medication Sig Dispense Refill Last Dose   acetaZOLAMIDE ER (DIAMOX) 500 MG capsule Take 1 capsule by mouth 2 (two) times daily.      ADVAIR DISKUS 250-50 MCG/ACT AEPB Inhale 1 puff into the lungs in the morning and at bedtime. 60 each 2    albuterol (VENTOLIN HFA) 108 (90 Base) MCG/ACT inhaler Inhale 2 puffs into the lungs See admin instructions. Inhale 2 puffs every 4 to 6 hours as needed for shortness of breath 8 g 3    alendronate (FOSAMAX) 70 MG tablet Take 70 mg by mouth once a week.      ARIPiprazole (ABILIFY) 10 MG tablet Take 10 mg by mouth daily. Takes 1/2 daily      aspirin EC 81 MG tablet Take 81 mg by mouth daily. Swallow whole.      atorvastatin (LIPITOR) 40 MG  tablet Take 40 mg by mouth daily.      azelastine (ASTELIN) 0.1 % nasal spray Place 2 sprays into both nostrils 2 (two) times daily.      CALCIUM PO Take 3 tablets by mouth daily.      Cholecalciferol 50 MCG (2000 UT) TABS Take 1 tablet by mouth daily at 6 (six) AM.      clonazePAM (KLONOPIN) 0.5 MG tablet Take 1 mg by mouth 2 (two) times daily.      dorzolamide-timolol (COSOPT) 22.3-6.8 MG/ML ophthalmic solution Place 1 drop into the right eye 2 (two) times daily.      ELDERBERRY PO Take 3 tablets by mouth daily.      labetalol (NORMODYNE) 100 MG tablet Take 1 tablet (100 mg total) by mouth 2 (two) times daily. 30 tablet 0    latanoprost (XALATAN) 0.005 % ophthalmic solution Place 1 drop into the right eye at bedtime.      levocetirizine (XYZAL) 5 MG tablet Take 5 mg by mouth in the morning.      levothyroxine (SYNTHROID) 125 MCG tablet Take 1 tablet (125 mcg total) by mouth daily at 6 (six) AM. 30 tablet 1    lisinopril (ZESTRIL) 20 MG tablet Take 1 tablet (20 mg total) by mouth daily. 30 tablet 2    Melatonin (MELATONIN EXTRA STRENGTH) 10 MG TABS Take 1 tablet by mouth at bedtime.      methylPREDNISolone (MEDROL DOSEPAK) 4 MG TBPK tablet Medrol Dosepak take as  instructed 21 tablet 0    montelukast (SINGULAIR) 10 MG tablet Take 10 mg by mouth at bedtime.      Multiple Vitamin (MULTI-VITAMIN) tablet Take 2 tablets by mouth daily.      nicotine (NICODERM CQ - DOSED IN MG/24 HOURS) 21 mg/24hr patch Place 1 patch (21 mg total) onto the skin daily. 28 patch 0    zolpidem (AMBIEN) 10 MG tablet Take 10 mg by mouth at bedtime as needed for sleep.       Assessment: Pharmacy consulted to dose heparin in patient with chest pain/ACS. Patient is not on anticoagulation prior to admission.  HL 0.16- subtherapeutic  CBC WNL Trop 168  Goal of Therapy:  Heparin level 0.3-0.7 units/ml Monitor platelets by anticoagulation protocol: Yes   Plan:  Rebolus 1500 units IV x 1 Increase heparin infusion to 850 units/hr Check anti-Xa level in 6-8 hours and daily Continue to monitor H&H and platelets.   Judeth Cornfield, PharmD Clinical Pharmacist 04/10/2023 2:54 PM

## 2023-04-10 NOTE — Sepsis Progress Note (Signed)
Elink monitoring for the code sepsis protocol.  

## 2023-04-10 NOTE — ED Notes (Signed)
ED TO INPATIENT HANDOFF REPORT  ED Nurse Name and Phone #:   S Name/Age/Gender Dorothy [name] 67 y.o. female Room/Bed: APA04/APA04  Code Status   Code Status: Full Code  Home/SNF/Other Home Patient oriented to: self, place, time, and situation Is this baseline? Yes   Triage Complete: Triage complete  Chief Complaint COPD with acute exacerbation (HCC) [J44.1]  Triage Note Pt BIB [facility] c/o difficulty breathing. Hx of COPD. Audible wheezing noted. Pt states she just started using at home oxygen, 3L [place]  125 solumedrol and 2.5 albuterol and duo neb given PTA    Allergies Allergies  Allergen Reactions   Sulfa Antibiotics Rash    Level of Care/Admitting Diagnosis ED Disposition     ED Disposition  Admit   Condition  --   Comment  Hospital Area: [facility] [[id number]]  Level of Care: Telemetry [5]  Covid Evaluation: Asymptomatic - no recent exposure (last 10 days) testing not required  Diagnosis: COPD with acute exacerbation [facility]) [[id number]]  Admitting Physician: Erick Blinks [[id number]]  Attending Physician: Maurilio Lovely D [id number]  Certification:: I certify this patient will need inpatient services for at least 2 midnights  Estimated Length of Stay: 2          B Medical/Surgery History 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   Benign essential HTN 01/17/2012   Cancer (HCC) 1982   ovarian   Cancer of cervix (HCC)    Constipation    Depression    Takes Klonopin   Depression with anxiety 01/17/2012   GERD (gastroesophageal reflux disease)    Hypercholesteremia    Hypertension    takes meds   Hypothyroidism    on Levonthyroxine   Hypothyroidism (acquired) 01/17/2012   Since age 89   Low sodium levels 09/25/2022   Na level 125 at [facility]   Osteopenia 2018   forearm   Osteoporosis 07/25/2021   forearm   Ovarian cyst  01/17/2012   1995  BSO   Personal history of colonic polyps 11/07/2007   Pneumonia    x 2- last time > 5 years ago   Pneumonia 01/17/2012   10 yrs ago and 5 years ago    Rash and other nonspecific skin eruption 01/17/2012   Dorsum hands & extensor surfaces arms; intermittent   Past Surgical History:  Procedure Laterality Date   ABDOMINAL HYSTERECTOMY  1982   ovaries retained--for CIS   CELIAC ARTERY BYPASS  09/25/2012   Procedure: CELIAC ARTERY BYPASS;  Surgeon: Sherren Kerns, MD;  Location: [facility] OR;  Service: Vascular;  Laterality: N/A;  Bypass using Hemashield Gold Vascular Graft 12mm x 6mm x 40cm   COLONOSCOPY  2009   [place]-polyps, 5 yr FU   JOINT REPLACEMENT     MESENTERIC ARTERY BYPASS  09/25/2012   Procedure: MESENTERIC ARTERY BYPASS;  Surgeon: Sherren Kerns, MD;  Location: [facility] OR;  Service: Vascular;  Laterality: N/A;  Bypass using Hemashield Gold Vascular Graft 12mm x 6mm x 40cm   OVARIAN CYST REMOVAL  1997   -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   TOTAL HIP ARTHROPLASTY  09/06/2011   Procedure: TOTAL HIP ARTHROPLASTY;  Surgeon: Loreta Ave, MD;  Location: [facility] OR;  Service: Orthopedics;  Laterality: Left;  TOTAL HIP ARTHROPLASTY LEFT SIDE   TOTAL HIP ARTHROPLASTY  10/11/2011  Procedure: TOTAL HIP ARTHROPLASTY;  Surgeon: Loreta Ave, MD;  Location: [facility] [place] [facility] OR;  Service: Orthopedics;  Laterality: Right;  120 MINUTES FOR THIS SURGERY   WOUND EXPLORATION  10/02/2012   Procedure: WOUND EXPLORATION;  Surgeon: Sherren Kerns, MD;  Location[facility] OR;  Service: Vascular;  Laterality: N/A;  abdominal wound exploration     A IV Location/Drains/Wounds Patient Lines/Drains/Airways Status     Active Line/Drains/Airways     Name Placement date Placement time Site Days   Peripheral IV 04/10/23 20 G Left Antecubital 04/10/23  0355  Antecubital  less than 1            Intake/Output Last 24 hours  Intake/Output Summary (Last 24 hours) at 04/10/2023 1032 Last  data filed at 04/10/2023 0606 Gross per 24 hour  Intake 350 ml  Output --  Net 350 ml    Labs/Imaging Results for orders placed or performed during the hospital encounter of 04/10/23 (from the past 48 hour(s))  Blood Culture (routine x 2)     Status: None (Preliminary result)   Collection Time: 04/10/23  4:27 AM   Specimen: BLOOD  Result Value Ref Range   Specimen Description BLOOD BLOOD RIGHT ARM    Special Requests      BOTTLES DRAWN AEROBIC AND ANAEROBIC Blood Culture adequate volume   Culture      NO GROWTH < 12 HOURS Performed at [facility], [address] [place], [place] [address]    Report Status PENDING   Lactic acid, plasma     Status: None   Collection Time: 04/10/23  4:31 AM  Result Value Ref Range   Lactic Acid, Venous 1.1 0.5 - 1.9 mmol/L    Comment: Performed at [facility], [address], [place], [place] [address]  Comprehensive metabolic panel     Status: Abnormal   Collection Time: 04/10/23  4:31 AM  Result Value Ref Range   Sodium 128 (L) 135 - 145 mmol/L   Potassium 3.8 3.5 - 5.1 mmol/L   Chloride 103 98 - 111 mmol/L   CO2 17 (L) 22 - 32 mmol/L   Glucose, Bld 104 (H) 70 - 99 mg/dL    Comment: Glucose reference range applies only to samples taken after fasting for at least 8 hours.   BUN 14 8 - 23 mg/dL   Creatinine, Ser 0.98 0.44 - 1.00 mg/dL   Calcium 8.0 (L) 8.9 - 10.3 mg/dL   Total Protein 6.7 6.5 - 8.1 g/dL   Albumin 3.8 3.5 - 5.0 g/dL   AST 17 15 - 41 U/L   ALT 25 0 - 44 U/L   Alkaline Phosphatase 55 38 - 126 U/L   Total Bilirubin 0.9 0.3 - 1.2 mg/dL   GFR, Estimated >11 >91 mL/min    Comment: (NOTE) Calculated using the CKD-EPI Creatinine Equation (2021)    Anion gap 8 5 - 15    Comment: Performed at [facility], [address], [place], [place] [address]  CBC with Differential     Status: Abnormal   Collection Time: 04/10/23  4:31 AM  Result Value Ref Range   WBC 12.2 (H) 4.0 - 10.5 K/uL   RBC 4.90 3.87 - 5.11 MIL/uL   Hemoglobin  13.7 12.0 - 15.0 g/dL   HCT 56.2 13.0 - 86.5 %   MCV 80.4 80.0 - 100.0 fL   MCH 28.0 26.0 - 34.0 pg   MCHC 34.8 30.0 - 36.0 g/dL   RDW 78.4 (H) 69.6 -  15.5 %   Platelets 218 150 - 400 K/uL   nRBC 0.0 0.0 - 0.2 %   Neutrophils Relative % 83 %   Neutro Abs 10.2 (H) 1.7 - 7.7 K/uL   Lymphocytes Relative 4 %   Lymphs Abs 0.5 (L) 0.7 - 4.0 K/uL   Monocytes Relative 12 %   Monocytes Absolute 1.4 (H) 0.1 - 1.0 K/uL   Eosinophils Relative 0 %   Eosinophils Absolute 0.0 0.0 - 0.5 K/uL   Basophils Relative 0 %   Basophils Absolute 0.0 0.0 - 0.1 K/uL   Immature Granulocytes 1 %   Abs Immature Granulocytes 0.08 (H) 0.00 - 0.07 K/uL    Comment: Performed at [facility], [address], [place], [place] [address]  Protime-INR     Status: None   Collection Time: 04/10/23  4:31 AM  Result Value Ref Range   Prothrombin Time 13.2 11.4 - 15.2 seconds   INR 1.0 0.8 - 1.2    Comment: (NOTE) INR goal varies based on device and disease states. Performed at [facility], [address] [place], [place] [address]   APTT     Status: None   Collection Time: 04/10/23  4:31 AM  Result Value Ref Range   aPTT 29 24 - 36 seconds    Comment: Performed at [facility], [address] [place], [place] [address]  Blood Culture (routine x 2)     Status: None (Preliminary result)   Collection Time: 04/10/23  4:31 AM   Specimen: BLOOD  Result Value Ref Range   Specimen Description BLOOD BLOOD RIGHT HAND    Special Requests      BOTTLES DRAWN AEROBIC AND ANAEROBIC Blood Culture adequate volume   Culture      NO GROWTH < 12 HOURS Performed at [facility], [address], [place], [place] [address]    Report Status PENDING   Troponin I (High Sensitivity)     Status: Abnormal   Collection Time: 04/10/23  4:31 AM  Result Value Ref Range   Troponin I (High Sensitivity) 81 (H) <18 ng/L    Comment: (NOTE) Elevated high sensitivity troponin I (hsTnI) values and significant  changes across serial measurements  may suggest ACS but many other  chronic and acute conditions are known to elevate hsTnI results.  Refer to the "Links" section for chest pain algorithms and additional  guidance. Performed at [facility], [address], [place], [place] [address]   Brain natriuretic peptide     Status: None   Collection Time: 04/10/23  4:31 AM  Result Value Ref Range   B Natriuretic Peptide 37.0 0.0 - 100.0 pg/mL    Comment: Performed at [facility], [address], [place], [place] [address]  Blood gas, arterial (at [facility])     Status: Abnormal   Collection Time: 04/10/23  4:37 AM  Result Value Ref Range   FIO2 60.0 %   pH, Arterial 7.26 (L) 7.35 - 7.45   pCO2 arterial 39 32 - 48 mmHg   pO2, Arterial 168 (H) 83 - 108 mmHg   Bicarbonate 17.5 (L) 20.0 - 28.0 mmol/L   Acid-base deficit 9.0 (H) 0.0 - 2.0 mmol/L   O2 Saturation 99.8 %   Patient temperature 37.0    Collection site RIGHT RADIAL    Drawn by [id number]    Allens test (pass/fail) PASS PASS    Comment: Performed at [facility], [address], [place], [place] [address]  Resp panel by  RT-PCR (RSV, Flu A&B, Covid) Anterior Nasal Swab     Status: None   Collection Time: 04/10/23  4:50 AM   Specimen: Anterior Nasal Swab  Result Value Ref Range   SARS Coronavirus 2 by RT PCR NEGATIVE NEGATIVE    Comment: (NOTE) SARS-CoV-2 target nucleic acids are NOT DETECTED.  The SARS-CoV-2 RNA is generally detectable in upper respiratory specimens during the acute phase of infection. The lowest concentration of SARS-CoV-2 viral copies this assay can detect is 138 copies/mL. A negative result does not preclude SARS-Cov-2 infection and should not be used as the sole basis for treatment or other patient management decisions. A negative result may occur with  improper specimen collection/handling, submission of specimen other than nasopharyngeal swab, presence of viral mutation(s) within the areas targeted by this assay, and inadequate number of  viral copies(<138 copies/mL). A negative result must be combined with clinical observations, patient history, and epidemiological information. The expected result is Negative.  Fact Sheet for Patients:  BloggerCourse.com  Fact Sheet for Healthcare Providers:  SeriousBroker.it  This test is no t yet approved or cleared by the [place] FDA and  has been authorized for detection and/or diagnosis of SARS-CoV-2 by FDA under an Emergency Use Authorization (EUA). This EUA will remain  in effect (meaning this test can be used) for the duration of the COVID-19 declaration under Section 564(b)(1) of the Act, 21 U.S.C.section 360bbb-3(b)(1), unless the authorization is terminated  or revoked sooner.       Influenza A by PCR NEGATIVE NEGATIVE   Influenza B by PCR NEGATIVE NEGATIVE    Comment: (NOTE) The Xpert Xpress SARS-CoV-2/FLU/RSV plus assay is intended as an aid in the diagnosis of influenza from Nasopharyngeal swab specimens and should not be used as a sole basis for treatment. Nasal washings and aspirates are unacceptable for Xpert Xpress SARS-CoV-2/FLU/RSV testing.  Fact Sheet for Patients: BloggerCourse.com  Fact Sheet for Healthcare Providers: SeriousBroker.it  This test is not yet approved or cleared by the [place] FDA and has been authorized for detection and/or diagnosis of SARS-CoV-2 by FDA under an Emergency Use Authorization (EUA). This EUA will remain in effect (meaning this test can be used) for the duration of the COVID-19 declaration under Section 564(b)(1) of the Act, 21 U.S.C. section 360bbb-3(b)(1), unless the authorization is terminated or revoked.     Resp Syncytial Virus by PCR NEGATIVE NEGATIVE    Comment: (NOTE) Fact Sheet for Patients: BloggerCourse.com  Fact Sheet for Healthcare  Providers: SeriousBroker.it  This test is not yet approved or cleared by the [place] FDA and has been authorized for detection and/or diagnosis of SARS-CoV-2 by FDA under an Emergency Use Authorization (EUA). This EUA will remain in effect (meaning this test can be used) for the duration of the COVID-19 declaration under Section 564(b)(1) of the Act, 21 U.S.C. section 360bbb-3(b)(1), unless the authorization is terminated or revoked.  Performed at [facility], [address], [place], [place] [address]   Urinalysis, w/ Reflex to Culture (Infection Suspected) -Urine, Clean Catch     Status: Abnormal   Collection Time: 04/10/23  5:29 AM  Result Value Ref Range   Specimen Source URINE, CLEAN CATCH    Color, Urine YELLOW YELLOW   APPearance HAZY (A) CLEAR   Specific Gravity, Urine 1.012 1.005 - 1.030   pH 5.0 5.0 - 8.0   Glucose, UA NEGATIVE NEGATIVE mg/dL   Hgb urine dipstick MODERATE (A) NEGATIVE   Bilirubin Urine NEGATIVE NEGATIVE   Ketones,  ur NEGATIVE NEGATIVE mg/dL   Protein, ur 30 (A) NEGATIVE mg/dL   Nitrite NEGATIVE NEGATIVE   Leukocytes,Ua NEGATIVE NEGATIVE   RBC / HPF 6-10 0 - 5 RBC/hpf   WBC, UA 0-5 0 - 5 WBC/hpf    Comment:        Reflex urine culture not performed if WBC <=10, OR if Squamous epithelial cells >5. If Squamous epithelial cells >5 suggest recollection.    Bacteria, UA RARE (A) NONE SEEN   Squamous Epithelial / HPF 0-5 0 - 5 /HPF    Comment: Performed at [facility], [address] [place], [place] [address]  Troponin I (High Sensitivity)     Status: Abnormal   Collection Time: 04/10/23  6:30 AM  Result Value Ref Range   Troponin I (High Sensitivity) 168 (HH) <18 ng/L    Comment: CRITICAL RESULT CALLED TO, READ BACK BY AND VERIFIED WITH Damia Bobrowski,A AT 7:15AM ON 04/10/23 BY FESTERMAN,C (NOTE) Elevated high sensitivity troponin I (hsTnI) values and significant  changes across serial measurements may suggest ACS but many  other  chronic and acute conditions are known to elevate hsTnI results.  Refer to the "Links" section for chest pain algorithms and additional  guidance. Performed at [facility], [address], [place], [place] [address]   Troponin I (High Sensitivity)     Status: Abnormal   Collection Time: 04/10/23  8:36 AM  Result Value Ref Range   Troponin I (High Sensitivity) 177 (HH) <18 ng/L    Comment: CRITICAL RESULT CALLED TO, READ BACK BY AND VERIFIED WITH SMALLWOOD,M AT 9:30AM ON 04/10/23 BY FESTERMAN,C (NOTE) Elevated high sensitivity troponin I (hsTnI) values and significant  changes across serial measurements may suggest ACS but many other  chronic and acute conditions are known to elevate hsTnI results.  Refer to the "Links" section for chest pain algorithms and additional  guidance. Performed at [facility], [address], [place], [place] [address]   Procalcitonin     Status: None   Collection Time: 04/10/23  8:36 AM  Result Value Ref Range   Procalcitonin <0.10 ng/mL    Comment:        Interpretation: PCT (Procalcitonin) <= 0.5 ng/mL: Systemic infection (sepsis) is not likely. Local bacterial infection is possible. (NOTE)       Sepsis PCT Algorithm           Lower Respiratory Tract                                      Infection PCT Algorithm    ----------------------------     ----------------------------         PCT < 0.25 ng/mL                PCT < 0.10 ng/mL          Strongly encourage             Strongly discourage   discontinuation of antibiotics    initiation of antibiotics    ----------------------------     -----------------------------       PCT 0.25 - 0.50 ng/mL            PCT 0.10 - 0.25 ng/mL               OR       >80% decrease in PCT  Discourage initiation of                                            antibiotics      Encourage discontinuation           of antibiotics    ----------------------------     -----------------------------          PCT >= 0.50 ng/mL              PCT 0.26 - 0.50 ng/mL               AND        <80% decrease in PCT             Encourage initiation of                                             antibiotics       Encourage continuation           of antibiotics    ----------------------------     -----------------------------        PCT >= 0.50 ng/mL                  PCT > 0.50 ng/mL               AND         increase in PCT                  Strongly encourage                                      initiation of antibiotics    Strongly encourage escalation           of antibiotics                                     -----------------------------                                           PCT <= 0.25 ng/mL                                                 OR                                        > 80% decrease in PCT                                      Discontinue / Do not initiate  antibiotics  Performed at [facility], [address], [place], [place] [address]    DG Chest 2 View  Result Date: 04/10/2023 CLINICAL DATA:  COPD with difficulty breathing EXAM: CHEST - 2 VIEW COMPARISON:  03/16/2023 FINDINGS: Artifact from EKG leads. Normal heart size and mediastinal contours. Hyperinflation. There is no edema, consolidation, effusion, or pneumothorax. IMPRESSION: COPD without acute superimposed finding. Electronically Signed   By: Tiburcio Pea M.D.   On: 04/10/2023 04:27    Pending Labs Unresulted Labs (From admission, onward)     Start     Ordered   04/12/23 0500  CBC  Daily,   R      04/10/23 0801   04/11/23 0500  Magnesium  Tomorrow morning,   R        04/10/23 0753   04/11/23 0500  Basic metabolic panel  Tomorrow morning,   R        04/10/23 0753   04/11/23 0500  CBC  Tomorrow morning,   R        04/10/23 0753   04/11/23 0500  Heparin level (unfractionated)  Daily,   R      04/10/23 0801   04/10/23 1400  Heparin level (unfractionated)  Once-Timed,    TIMED        04/10/23 0801            Vitals/Pain Today's Vitals   04/10/23 0736 04/10/23 0752 04/10/23 0855 04/10/23 0900  BP:    105/70  Pulse:    85  Resp:    (!) 30  Temp: 97.8 F (36.6 C)     TempSrc:      SpO2:  98% 99% 100%  Weight:      Height:      PainSc:        Isolation Precautions Airborne and Contact precautions  Medications Medications  azithromycin (ZITHROMAX) 500 mg in sodium chloride 0.9 % 250 mL IVPB (0 mg Intravenous Stopped 04/10/23 0606)  albuterol (PROVENTIL) (2.5 MG/3ML) 0.083% nebulizer solution (  Canceled Entry 04/10/23 0408)  acetaZOLAMIDE ER (DIAMOX) 12 hr capsule 500 mg (has no administration in time range)  atorvastatin (LIPITOR) tablet 40 mg (has no administration in time range)  labetalol (NORMODYNE) tablet 100 mg (has no administration in time range)  lisinopril (ZESTRIL) tablet 20 mg (has no administration in time range)  aspirin EC tablet 81 mg (has no administration in time range)  ARIPiprazole (ABILIFY) tablet 10 mg (has no administration in time range)  zolpidem (AMBIEN) tablet 10 mg (has no administration in time range)  nicotine (NICODERM CQ - dosed in mg/24 hours) patch 21 mg (has no administration in time range)  levothyroxine (SYNTHROID) tablet 125 mcg (has no administration in time range)  Melatonin TABS 1 tablet (has no administration in time range)  clonazePAM (KLONOPIN) tablet 1 mg (has no administration in time range)  Multi-Vitamin 2 tablet (has no administration in time range)  levocetirizine (XYZAL) tablet 5 mg (has no administration in time range)  azelastine (ASTELIN) 0.1 % nasal spray 2 spray (has no administration in time range)  montelukast (SINGULAIR) tablet 10 mg (has no administration in time range)  dorzolamide-timolol (COSOPT) 2-0.5 % ophthalmic solution 1 drop (has no administration in time range)  latanoprost (XALATAN) 0.005 % ophthalmic solution 1 drop (has no administration in time range)   ipratropium-albuterol (DUONEB) 0.5-2.5 (3) MG/3ML nebulizer solution 3 mL (3 mLs Nebulization Given 04/10/23 0854)  budesonide (PULMICORT) nebulizer solution 0.25 mg (0.25 mg Nebulization Given 04/10/23  1610)  arformoterol (BROVANA) nebulizer solution 15 mcg (15 mcg Nebulization Given 04/10/23 0854)  methylPREDNISolone sodium succinate (SOLU-MEDROL) 125 mg/2 mL injection 120 mg (120 mg Intravenous Given 04/10/23 0913)  acetaminophen (TYLENOL) tablet 650 mg (has no administration in time range)    Or  acetaminophen (TYLENOL) suppository 650 mg (has no administration in time range)  ondansetron (ZOFRAN) tablet 4 mg (has no administration in time range)    Or  ondansetron (ZOFRAN) injection 4 mg (has no administration in time range)  heparin ADULT infusion 100 units/mL (25000 units/249mL) (650 Units/hr Intravenous New Bag/Given 04/10/23 0922)  albuterol (PROVENTIL) (2.5 MG/3ML) 0.083% nebulizer solution (10 mg/hr Nebulization Given 04/10/23 0411)  heparin bolus via infusion 3,400 Units (3,400 Units Intravenous Bolus from Bag 04/10/23 0923)    Mobility Patient weak and dyspneic. 2 assist (walker and staff) with o2      Focused Assessments    R Recommendations: See Admitting Provider Note  Report given to:   Additional Notes:   Patient opting to use brief instead of ambulating to restroom or to bedside commode.

## 2023-04-10 NOTE — TOC Initial Note (Signed)
Transition of Care [facility]) - Initial/Assessment Note    Patient Details  Name: Dorothy [name] MRN: [id number] Date of Birth: [birth year]  Transition of Care [facility]) CM/SW Contact:    Annice Needy, LCSW Phone Number: 04/10/2023, 1:23 PM  Clinical Narrative:                 Patient on home oxygen 3L, has nebulizer. Readmission.  Patient accessed on 03/20/23 see below:  CSW completed assessment. Patient lives with adult siblings in her home. She describes sister as ill with COPD and brother previously with trache and throat cancer.  Brother now does all cooking and driving pt and sister around.  Pt is seen at [facility] periodically and sister at [facility]. Brother transports. Pt had 2 hip replacements in 2012  and had HHPT at that time. Pt does bathing herself, has rails in bathroom and shower.  Pt also has a shower chair. TOC will continue  to follow.  Expected Discharge Plan: Home/Self Care Barriers to Discharge: Continued Medical Work up   Patient Goals and CMS Choice Patient states their goals for this hospitalization and ongoing recovery are:: return home          Expected Discharge Plan and Services       Living arrangements for the past 2 months: Single Family Home                                      Prior Living Arrangements/Services Living arrangements for the past 2 months: Single Family Home Lives with:: Siblings Patient language and need for interpreter reviewed:: Yes        Need for Family Participation in Patient Care: Yes (Comment) Care giver support system in place?: Yes (comment) Current home services: DME (home oxygen 3L) Criminal Activity/Legal Involvement Pertinent to Current Situation/Hospitalization: No - Comment as needed  Activities of Daily Living Home Assistive Devices/Equipment: Oxygen ADL Screening (condition at time of admission) Patient's cognitive ability adequate to safely complete daily activities?: Yes Is the patient deaf or have  difficulty hearing?: No Does the patient have difficulty seeing, even when wearing glasses/contacts?: No Does the patient have difficulty concentrating, remembering, or making decisions?: No Patient able to express need for assistance with ADLs?: Yes Does the patient have difficulty dressing or bathing?: No Independently performs ADLs?: Yes (appropriate for developmental age) Does the patient have difficulty walking or climbing stairs?: No Weakness of Legs: None Weakness of Arms/Hands: None  Permission Sought/Granted                  Emotional Assessment       Orientation: : Oriented to Self, Oriented to Place, Oriented to  Time, Oriented to Situation Alcohol / Substance Use: Not Applicable Psych Involvement: No (comment)  Admission diagnosis:  COPD exacerbation (HCC) [J44.1] COPD with acute exacerbation (HCC) [J44.1] Fever, unspecified fever cause [R50.9] Patient Active Problem List   Diagnosis Date Noted   COPD with acute exacerbation (HCC) 04/10/2023   Hypertensive crisis 03/17/2023   Psychiatric disorder 03/17/2023   COPD (chronic obstructive pulmonary disease) (HCC) 03/17/2023   SOB (shortness of breath) 03/17/2023   Hypotension 11/08/2021   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 11/04/2020   Hypertensive retinopathy of both eyes 11/04/2020   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 11/04/2020   Nuclear sclerotic cataract of both eyes 11/04/2020   Peripheral  focal chorioretinal inflammation of both eyes 11/04/2020   Pulmonary nodule 08/18/2016   COPD with exacerbation (HCC) 08/18/2016   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   Acute respiratory failure with hypoxia (HCC) 09/25/2012   Hyponatremia 07/10/2012   Abdominal pain 07/10/2012   Hematochezia 07/10/2012   Avascular necrosis of bones of both hips (HCC) 01/17/2012   Hypothyroidism (acquired) 01/17/2012   Benign essential HTN  01/17/2012   Depression with anxiety 01/17/2012   Cancer of cervix (HCC) 01/17/2012   Ovarian cyst 01/17/2012   Community acquired pneumonia 01/17/2012   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   Rash and other nonspecific skin eruption 01/17/2012   Personal history of colonic polyps 11/07/2007   PCP:  Erasmo Downer, NP Pharmacy:   [facility] [address] [place] - [address] [place] [address] Phone: [phone] Fax: [phone]  [facility] - [place], [place] - [address] [place] [address] Phone: [phone] Fax: [phone]     Social Determinants of Health (SDOH) Social History: SDOH Screenings   Food Insecurity: No Food Insecurity (04/10/2023)  Housing: Low Risk  (04/10/2023)  Transportation Needs: No Transportation Needs (04/10/2023)  Utilities: Not At Risk (04/10/2023)  Tobacco Use: High Risk (04/10/2023)   SDOH Interventions:     Readmission Risk Interventions    03/18/2023    3:36 PM  Readmission Risk Prevention Plan  Transportation Screening Complete  PCP or Specialist Appt within 5-7 Days Complete  Home Care Screening Complete  Medication Review (RN CM) Complete

## 2023-04-10 NOTE — ED Notes (Signed)
Antibiotics started after blood and blood cultures were drawn

## 2023-04-10 NOTE — H&P (Addendum)
History and Physical    [name]:[id number] DOB: April 23, 1956 DOA: 04/10/2023  PCP: Erasmo Downer, NP   Patient coming from: Home  Chief Complaint: Dyspnea  HPI: Dorothy [name] is a 67 y.o. female with medical history significant for COPD, tobacco abuse, anxiety/depression, hypertension, hypothyroidism, GERD, and dyslipidemia who was recently discharged on 5/27 after treatment for hypertensive crisis.  She presented to the ED with worsening shortness of breath over the last 2 weeks that worsened last night and prompted a call to EMS.  She was recently started on home oxygen per her primary care physician.  She denies a significant cough, fevers, chills, chest pain, or lower extremity edema.  She had significant wheezing requiring nonrebreather mask and nebulizer treatments on arrival.  She continues to smoke 1 pack/day.   ED Course: Vital signs with temperature 100.3 and otherwise stable heart rate and blood pressure.  Leukocytosis of 12,200 and sodium 128.  Troponin 81 and follow-up 168.  EKG with no significant findings aside from sinus tachycardia at 110 bpm.  Review of Systems: Reviewed as noted above, otherwise negative.  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   Benign essential HTN 01/17/2012   Cancer (HCC) 1982   ovarian   Cancer of cervix (HCC)    Constipation    Depression    Takes Klonopin   Depression with anxiety 01/17/2012   GERD (gastroesophageal reflux disease)    Hypercholesteremia    Hypertension    takes meds   Hypothyroidism    on Levonthyroxine   Hypothyroidism (acquired) 01/17/2012   Since age 34   Low sodium levels 09/25/2022   Na level 125 at [facility]   Osteopenia 2018   forearm   Osteoporosis 07/25/2021   forearm   Ovarian cyst 01/17/2012   1995  BSO   Personal history of colonic polyps 11/07/2007   Pneumonia     x 2- last time > 5 years ago   Pneumonia 01/17/2012   10 yrs ago and 5 years ago    Rash and other nonspecific skin eruption 01/17/2012   Dorsum hands & extensor surfaces arms; intermittent    Past Surgical History:  Procedure Laterality Date   ABDOMINAL HYSTERECTOMY  1982   ovaries retained--for CIS   CELIAC ARTERY BYPASS  09/25/2012   Procedure: CELIAC ARTERY BYPASS;  Surgeon: Sherren Kerns, MD;  Location: [facility] OR;  Service: Vascular;  Laterality: N/A;  Bypass using Hemashield Gold Vascular Graft 12mm x 6mm x 40cm   COLONOSCOPY  2009   [place]-polyps, 5 yr FU   JOINT REPLACEMENT     MESENTERIC ARTERY BYPASS  09/25/2012   Procedure: MESENTERIC ARTERY BYPASS;  Surgeon: Sherren Kerns, MD;  Location: [facility] OR;  Service: Vascular;  Laterality: N/A;  Bypass using Hemashield Gold Vascular Graft 12mm x 6mm x 40cm   OVARIAN CYST REMOVAL  1997   -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   TOTAL HIP ARTHROPLASTY  09/06/2011   Procedure: TOTAL HIP ARTHROPLASTY;  Surgeon: Loreta Ave, MD;  Location: [facility] OR;  Service: Orthopedics;  Laterality: Left;  TOTAL HIP ARTHROPLASTY LEFT SIDE   TOTAL HIP ARTHROPLASTY  10/11/2011   Procedure: TOTAL HIP ARTHROPLASTY;  Surgeon: Loreta Ave, MD;  Location: [facility] OR;  Service: Orthopedics;  Laterality: Right;  120 MINUTES FOR THIS SURGERY  WOUND EXPLORATION  10/02/2012   Procedure: WOUND EXPLORATION;  Surgeon: Sherren Kerns, MD;  Location: [facility] OR;  Service: Vascular;  Laterality: N/A;  abdominal wound exploration     reports that she has been smoking cigarettes. She has a 40.00 pack-year smoking history. She has never used smokeless tobacco. She reports that she does not drink alcohol and does not use drugs.  Allergies  Allergen Reactions   Sulfa Antibiotics Rash    Family History  Problem Relation Age of Onset   Cancer Mother        lung   Heart failure Father    Breast cancer Sister 35       dec age 56   Cancer Sister    Heart  attack Brother 51   Heart attack Brother    Hypertension Son    Ovarian cancer Paternal Grandmother        dec age 57   Anesthesia problems Neg Hx    Hypotension Neg Hx    Malignant hyperthermia Neg Hx    Pseudochol deficiency Neg Hx    Colon cancer Neg Hx    Esophageal cancer Neg Hx    Rectal cancer Neg Hx    Stomach cancer Neg Hx     Prior to Admission medications   Medication Sig Start Date End Date Taking? Authorizing Provider  acetaZOLAMIDE ER (DIAMOX) 500 MG capsule Take 1 capsule by mouth 2 (two) times daily. 03/20/23   [provider]  ADVAIR DISKUS 250-50 MCG/ACT AEPB Inhale 1 puff into the lungs in the morning and at bedtime. 03/19/23 06/17/23  ShahmehdiGemma Payor, MD  albuterol (VENTOLIN HFA) 108 (90 Base) MCG/ACT inhaler Inhale 2 puffs into the lungs See admin instructions. Inhale 2 puffs every 4 to 6 hours as needed for shortness of breath 03/19/23 04/18/23  Kendell Bane, MD  alendronate (FOSAMAX) 70 MG tablet Take 70 mg by mouth once a week.    [provider]  ARIPiprazole (ABILIFY) 10 MG tablet Take 10 mg by mouth daily. Takes 1/2 daily 02/16/17   [provider]  aspirin EC 81 MG tablet Take 81 mg by mouth daily. Swallow whole.    [provider]  atorvastatin (LIPITOR) 40 MG tablet Take 40 mg by mouth daily. 06/19/22   [provider]  azelastine (ASTELIN) 0.1 % nasal spray Place 2 sprays into both nostrils 2 (two) times daily. 02/07/23   [provider]  CALCIUM PO Take 3 tablets by mouth daily.    [provider]  Cholecalciferol 50 MCG (2000 UT) TABS Take 1 tablet by mouth daily at 6 (six) AM.    [provider]  clonazePAM (KLONOPIN) 0.5 MG tablet Take 1 mg by mouth 2 (two) times daily.    [provider]  dorzolamide-timolol (COSOPT) 22.3-6.8 MG/ML ophthalmic solution Place 1 drop into the right eye 2 (two) times daily.    [provider]  ELDERBERRY PO Take 3 tablets by mouth  daily.    [provider]  labetalol (NORMODYNE) 100 MG tablet Take 1 tablet (100 mg total) by mouth 2 (two) times daily. 03/21/23   Burgess Amor, PA-C  latanoprost (XALATAN) 0.005 % ophthalmic solution Place 1 drop into the right eye at bedtime. 03/20/23 03/19/24  [provider]  levocetirizine (XYZAL) 5 MG tablet Take 5 mg by mouth in the morning.    [provider]  levothyroxine (SYNTHROID) 125 MCG tablet Take 1 tablet (125 mcg total) by mouth daily  at 6 (six) AM. 03/20/23 05/19/23  Shahmehdi, Gemma Payor, MD  lisinopril (ZESTRIL) 20 MG tablet Take 1 tablet (20 mg total) by mouth daily. 03/19/23   Shahmehdi, Gemma Payor, MD  Melatonin (MELATONIN EXTRA STRENGTH) 10 MG TABS Take 1 tablet by mouth at bedtime.    [provider]  methylPREDNISolone (MEDROL DOSEPAK) 4 MG TBPK tablet Medrol Dosepak take as instructed 03/19/23   Shahmehdi, Guadalupe Maple A, MD  montelukast (SINGULAIR) 10 MG tablet Take 10 mg by mouth at bedtime. 01/03/23   [provider]  Multiple Vitamin (MULTI-VITAMIN) tablet Take 2 tablets by mouth daily.    [provider]  nicotine (NICODERM CQ - DOSED IN MG/24 HOURS) 21 mg/24hr patch Place 1 patch (21 mg total) onto the skin daily. 03/20/23   Shahmehdi, Gemma Payor, MD  zolpidem (AMBIEN) 10 MG tablet Take 10 mg by mouth at bedtime as needed for sleep. 02/04/19   [provider]    Physical Exam: Vitals:   04/10/23 0430 04/10/23 0545 04/10/23 0614 04/10/23 0640  BP: (!) 188/106 (!) 140/71    Pulse: (!) 112 94 (!) 103 87  Resp: (!) 39 17 (!) 33 19  Temp:      TempSrc:      SpO2: 100% 98% 96% 97%  Weight:      Height:        Constitutional: NAD, calm, comfortable Vitals:   04/10/23 0430 04/10/23 0545 04/10/23 0614 04/10/23 0640  BP: (!) 188/106 (!) 140/71    Pulse: (!) 112 94 (!) 103 87  Resp: (!) 39 17 (!) 33 19  Temp:      TempSrc:      SpO2: 100% 98% 96% 97%  Weight:      Height:       Eyes: lids and conjunctivae  normal Neck: normal, supple Respiratory: Wheezing bilaterally with congestion noted.  Mild increase in respiratory effort.  Currently on nasal cannula. Cardiovascular: Regular rate and rhythm, no murmurs. Abdomen: no tenderness, no distention. Bowel sounds positive.  Musculoskeletal:  No edema. Skin: no rashes, lesions, ulcers.  Psychiatric: Flat affect  Labs on Admission: I have personally reviewed following labs and imaging studies  CBC: Recent Labs  Lab 04/10/23 0431  WBC 12.2*  NEUTROABS 10.2*  HGB 13.7  HCT 39.4  MCV 80.4  PLT 218   Basic Metabolic Panel: Recent Labs  Lab 04/10/23 0431  NA 128*  K 3.8  CL 103  CO2 17*  GLUCOSE 104*  BUN 14  CREATININE 0.78  CALCIUM 8.0*   GFR: Estimated Creatinine Clearance: 63.4 mL/min (by C-G formula based on SCr of 0.78 mg/dL). Liver Function Tests: Recent Labs  Lab 04/10/23 0431  AST 17  ALT 25  ALKPHOS 55  BILITOT 0.9  PROT 6.7  ALBUMIN 3.8   No results for input(s): "LIPASE", "AMYLASE" in the last 168 hours. No results for input(s): "AMMONIA" in the last 168 hours. Coagulation Profile: Recent Labs  Lab 04/10/23 0431  INR 1.0   Cardiac Enzymes: No results for input(s): "CKTOTAL", "CKMB", "CKMBINDEX", "TROPONINI" in the last 168 hours. BNP (last 3 results) No results for input(s): "PROBNP" in the last 8760 hours. HbA1C: No results for input(s): "HGBA1C" in the last 72 hours. CBG: No results for input(s): "GLUCAP" in the last 168 hours. Lipid Profile: No results for input(s): "CHOL", "HDL", "LDLCALC", "TRIG", "CHOLHDL", "LDLDIRECT" in the last 72 hours. Thyroid Function Tests: No results for input(s): "TSH", "T4TOTAL", "FREET4", "T3FREE", "THYROIDAB" in the last 72 hours.  Anemia Panel: No results for input(s): "VITAMINB12", "FOLATE", "FERRITIN", "TIBC", "IRON", "RETICCTPCT" in the last 72 hours. Urine analysis:    Component Value Date/Time   COLORURINE YELLOW 04/10/2023 0529   APPEARANCEUR HAZY (A)  04/10/2023 0529   LABSPEC 1.012 04/10/2023 0529   PHURINE 5.0 04/10/2023 0529   GLUCOSEU NEGATIVE 04/10/2023 0529   HGBUR MODERATE (A) 04/10/2023 0529   BILIRUBINUR NEGATIVE 04/10/2023 0529   BILIRUBINUR n 03/20/2018 0855   KETONESUR NEGATIVE 04/10/2023 0529   PROTEINUR 30 (A) 04/10/2023 0529   UROBILINOGEN 0.2 03/20/2018 0855   UROBILINOGEN 0.2 09/13/2012 1028   NITRITE NEGATIVE 04/10/2023 0529   LEUKOCYTESUR NEGATIVE 04/10/2023 0529    Radiological Exams on Admission: DG Chest 2 View  Result Date: 04/10/2023 CLINICAL DATA:  COPD with difficulty breathing EXAM: CHEST - 2 VIEW COMPARISON:  03/16/2023 FINDINGS: Artifact from EKG leads. Normal heart size and mediastinal contours. Hyperinflation. There is no edema, consolidation, effusion, or pneumothorax. IMPRESSION: COPD without acute superimposed finding. Electronically Signed   By: Tiburcio Pea M.D.   On: 04/10/2023 04:27    EKG: Independently reviewed.   Assessment/Plan Principal Problem:   COPD with acute exacerbation (HCC)    Acute COPD exacerbation with hypoxemia -DuoNebs every 6 hours -IV Solu-Medrol 60 mg twice daily -Continue azithromycin, chest x-ray with no acute findings -Follow-up procalcitonin -Pulmicort twice daily -Brovana -Counseled on smoking cessation -Continue home oxygen 3 L -Antitussives/mucolytic's  Troponin elevation -Currently without chest pain -Continue to trend -Heparin drip for now -2D echocardiogram -Consultation with cardiology if trend continues to go up or if significant changes on echocardiogram  Hypertension -Continue home medications  Dyslipidemia -Continue statin  Psychiatric disorder/anxiety/depression -Continue Abilify -Continue Klonopin  Hypothyroidism -Continue Synthroid  Chronic hyponatremia -Currently euvolemic -Recent discontinuation of venlafaxine and HCTZ on discharge 5/27 -Asymptomatic can continue to monitor  Tobacco abuse -Counseled on  cessation   DVT prophylaxis: Heparin drip Code Status: Full Family Communication: None at bedside Disposition Plan:Admit for COPD exacerbation Consults called:None Admission status: Inpatient, Tele  Severity of Illness: The appropriate patient status for this patient is INPATIENT. Inpatient status is judged to be reasonable and necessary in order to provide the required intensity of service to ensure the patient's safety. The patient's presenting symptoms, physical exam findings, and initial radiographic and laboratory data in the context of their chronic comorbidities is felt to place them at high risk for further clinical deterioration. Furthermore, it is not anticipated that the patient will be medically stable for discharge from the hospital within 2 midnights of admission.   * I certify that at the point of admission it is my clinical judgment that the patient will require inpatient hospital care spanning beyond 2 midnights from the point of admission due to high intensity of service, high risk for further deterioration and high frequency of surveillance required.*   Kaisen Ackers D Korde Jeppsen DO [facility]  If 7PM-7AM, please contact night-coverage [email or web address]  04/10/2023, 7:34 AM

## 2023-04-10 NOTE — ED Provider Notes (Addendum)
[place] EMERGENCY DEPARTMENT AT [facility]  Provider Note  CSN: [id number] Arrival date & time: 04/10/23 2130  History Chief Complaint  Patient presents with   Shortness of Breath    Dorothy [name] is a 67 y.o. female with history of COPD, anxiety, hyponatremia brought to the ED via EMS from home for increasing SOB. She reports symptoms have been worsening for a couple of weeks. Saw PCP a few days ago and was started on home oxygen which is new for her. She has had some dry cough. EMS gave her duoneb, albuterol and solumedrol. She has had some improvement. She had an admission about 3 weeks ago for HTN, COPD and hyponatremia. Was on a steroid pack at discharge. She has nebs and inhalers for use at home.    Home Medications Prior to Admission medications   Medication Sig Start Date End Date Taking? Authorizing Provider  acetaZOLAMIDE ER (DIAMOX) 500 MG capsule Take 1 capsule by mouth 2 (two) times daily. 03/20/23   [provider]  ADVAIR DISKUS 250-50 MCG/ACT AEPB Inhale 1 puff into the lungs in the morning and at bedtime. 03/19/23 06/17/23  ShahmehdiGemma Payor, MD  albuterol (VENTOLIN HFA) 108 (90 Base) MCG/ACT inhaler Inhale 2 puffs into the lungs See admin instructions. Inhale 2 puffs every 4 to 6 hours as needed for shortness of breath 03/19/23 04/18/23  Kendell Bane, MD  alendronate (FOSAMAX) 70 MG tablet Take 70 mg by mouth once a week.    [provider]  ARIPiprazole (ABILIFY) 10 MG tablet Take 10 mg by mouth daily. Takes 1/2 daily 02/16/17   [provider]  aspirin EC 81 MG tablet Take 81 mg by mouth daily. Swallow whole.    [provider]  atorvastatin (LIPITOR) 40 MG tablet Take 40 mg by mouth daily. 06/19/22   [provider]  azelastine (ASTELIN) 0.1 % nasal spray Place 2 sprays into both nostrils 2 (two) times daily. 02/07/23   [provider]  CALCIUM PO Take 3 tablets by mouth daily.    [provider]  Cholecalciferol 50 MCG (2000 UT) TABS Take 1 tablet by mouth daily at 6 (six) AM.    [provider]  clonazePAM (KLONOPIN) 0.5 MG tablet Take 1 mg by mouth 2 (two) times daily.    [provider]  dorzolamide-timolol (COSOPT) 22.3-6.8 MG/ML ophthalmic solution Place 1 drop into the right eye 2 (two) times daily.    [provider]  ELDERBERRY PO Take 3 tablets by mouth daily.    [provider]  labetalol (NORMODYNE) 100 MG tablet Take 1 tablet (100 mg total) by mouth 2 (two) times daily. 03/21/23   Burgess Amor, PA-C  latanoprost (XALATAN) 0.005 % ophthalmic solution Place 1 drop into the right eye at bedtime. 03/20/23 03/19/24  [provider]  levocetirizine (XYZAL) 5 MG tablet Take 5 mg by mouth in the morning.    [provider]  levothyroxine (SYNTHROID) 125 MCG tablet Take 1 tablet (125 mcg total) by mouth daily at 6 (six) AM. 03/20/23 05/19/23  Shahmehdi, Gemma Payor, MD  lisinopril (ZESTRIL) 20 MG tablet Take 1 tablet (20 mg total) by mouth daily. 03/19/23   Shahmehdi, Gemma Payor, MD  Melatonin (MELATONIN EXTRA STRENGTH) 10 MG TABS Take 1 tablet by mouth at bedtime.    [provider]  methylPREDNISolone (MEDROL DOSEPAK) 4 MG TBPK tablet Medrol Dosepak take as instructed 03/19/23   Kendell Bane, MD  montelukast (SINGULAIR) 10 MG tablet Take 10 mg by mouth at bedtime. 01/03/23   [provider]  Multiple Vitamin (MULTI-VITAMIN) tablet Take 2 tablets by mouth daily.    [provider]  nicotine (NICODERM CQ - DOSED IN MG/24 HOURS) 21 mg/24hr patch Place 1 patch (21 mg total) onto the skin daily. 03/20/23   Shahmehdi, Gemma Payor, MD  zolpidem (AMBIEN) 10 MG tablet Take 10 mg by mouth at bedtime as needed for sleep. 02/04/19   [provider]     Allergies    Sulfa antibiotics   Review of Systems   Review of Systems Please see HPI for pertinent positives and negatives  Physical Exam BP (!)  140/71   Pulse 87   Temp 100.3 F (37.9 C)   Resp 19   Ht 5\' 7"  (1.702 m)   Wt 57.3 kg   LMP 10/23/1980 Comment: Hyst age 70(ovaries retained)  SpO2 97%   BMI 19.79 kg/m   Physical Exam Vitals and nursing note reviewed.  Constitutional:      Appearance: Normal appearance.  HENT:     Head: Normocephalic and atraumatic.     Nose: Nose normal.     Mouth/Throat:     Mouth: Mucous membranes are moist.  Eyes:     Extraocular Movements: Extraocular movements intact.     Conjunctiva/sclera: Conjunctivae normal.  Cardiovascular:     Rate and Rhythm: Normal rate.  Pulmonary:     Breath sounds: Wheezing present.     Comments: Increased WOB, subcostal retractions Abdominal:     General: Abdomen is flat.     Palpations: Abdomen is soft.     Tenderness: There is no abdominal tenderness.  Musculoskeletal:        General: No swelling. Normal range of motion.     Cervical back: Neck supple.     Right lower leg: No edema.     Left lower leg: No edema.  Skin:    General: Skin is warm and dry.  Neurological:     General: No focal deficit present.     Mental Status: She is alert.  Psychiatric:        Mood and Affect: Mood normal.     ED Results / Procedures / Treatments   EKG EKG Interpretation  Date/Time:  Tuesday April 10 2023 03:39:18 EDT Ventricular Rate:  110 PR Interval:  197 QRS Duration: 87 QT Interval:  287 QTC Calculation: 389 R Axis:   74 Text Interpretation: Sinus tachycardia Ventricular premature complex Aberrant conduction of SV complex(es) Right atrial enlargement Nonspecific repol abnormality, diffuse leads Since last tracing Rate faster Confirmed by Susy Frizzle [phone]) on 04/10/2023 4:03:38 AM  Procedures Procedures  Medications Ordered in the ED Medications  cefTRIAXone (ROCEPHIN) 2 g in sodium chloride 0.9 % 100 mL IVPB (0 g Intravenous Stopped 04/10/23 0457)  azithromycin (ZITHROMAX) 500 mg in sodium chloride 0.9 % 250 mL IVPB (0 mg Intravenous  Stopped 04/10/23 0606)  albuterol (PROVENTIL) (2.5 MG/3ML) 0.083% nebulizer solution (  Canceled Entry 04/10/23 0408)  albuterol (PROVENTIL) (2.5 MG/3ML) 0.083% nebulizer solution (10 mg/hr Nebulization Given 04/10/23 0411)    Initial Impression and Plan  Patient arrives with SOB, noted to also be febrile and tachycardic. Suspect pneumonia/sepsis, will check sepsis labs and begin respiratory Abx. Hold off on IVF bolus given HTN and possibility of other underlying cardiac etiologies not yet identified. Will give additional nebs as needed. Currently maintaining SpO2 on 3L [place].   ED Course  Clinical Course as of 04/10/23 1610  Tue Apr 10, 2023  9604 I personally viewed the images from radiology studies and agree with radiologist interpretation:  CXR with COPD, no focal infiltrates.  [CS]  0518 CBC with mild leukocytosis. CMP with mild hyponatremia, similar to previous. ABG without significant hypercapnia. Mild acidosis. Lactic acid is normal. PT/PTT is normal. Initial Trop is elevated, will check delta.  [CS]  0600 UA neg for infection. BNP is normal.  [CS]  0601 Hospitalist paged for admission.  [CS]  5409 Covid/Flu/RSV swab is negative.  [CS]  0709 Spoke with Dr. Sherryll Burger, Hospitalist, who will evaluate for admission.  [CS]    Clinical Course User Index [CS] Pollyann Savoy, MD     MDM Rules/Calculators/A&P Medical Decision Making Problems Addressed: COPD exacerbation [facility]): acute illness or injury that poses a threat to life or bodily functions Fever, unspecified fever cause: acute illness or injury  Amount and/or Complexity of Data Reviewed Labs: ordered. Decision-making details documented in ED Course. Radiology: ordered and independent interpretation performed. Decision-making details documented in ED Course. ECG/medicine tests: ordered and independent interpretation performed. Decision-making details documented in ED Course.  Risk Prescription drug management. Decision regarding  hospitalization.     Final Clinical Impression(s) / ED Diagnoses Final diagnoses:  COPD exacerbation (HCC)  Fever, unspecified fever cause    Rx / DC Orders ED Discharge Orders     None          Pollyann Savoy, MD 04/10/23 [id number]

## 2023-04-10 NOTE — Progress Notes (Signed)
ANTICOAGULATION CONSULT NOTE -   Pharmacy Consult for heparin Indication: chest pain/ACS  Allergies  Allergen Reactions   Sulfa Antibiotics Rash    Patient Measurements: Height: 5\' 7"  (170.2 cm) Weight: 56.2 kg (123 lb 14.4 oz) IBW/kg (Calculated) : 61.6 Heparin Dosing Weight: 57 kg   Vital Signs: Temp: 98.7 F (37.1 C) (06/18 2040) Temp Source: Oral (06/18 2040) BP: 109/69 (06/18 2040) Pulse Rate: 94 (06/18 2040)  Labs: Recent Labs    04/10/23 0431 04/10/23 0630 04/10/23 0836 04/10/23 0951 04/10/23 1356 04/10/23 2058  HGB 13.7  --   --   --   --   --   HCT 39.4  --   --   --   --   --   PLT 218  --   --   --   --   --   APTT 29  --   --   --   --   --   LABPROT 13.2  --   --   --   --   --   INR 1.0  --   --   --   --   --   HEPARINUNFRC  --   --   --   --  0.16* 0.28*  CREATININE 0.78  --   --   --   --   --   TROPONINIHS 81* 168* 177* 147*  --   --      Estimated Creatinine Clearance: 62.2 mL/min (by C-G formula based on SCr of 0.78 mg/dL).   Medical History: 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   Benign essential HTN 01/17/2012   Cancer (HCC) 1982   ovarian   Cancer of cervix (HCC)    Constipation    Depression    Takes Klonopin   Depression with anxiety 01/17/2012   GERD (gastroesophageal reflux disease)    Hypercholesteremia    Hypertension    takes meds   Hypothyroidism    on Levonthyroxine   Hypothyroidism (acquired) 01/17/2012   Since age 34   Low sodium levels 09/25/2022   Na level 125 at [facility]   Osteopenia 2018   forearm   Osteoporosis 07/25/2021   forearm   Ovarian cyst 01/17/2012   1995  BSO   Personal history of colonic polyps 11/07/2007   Pneumonia    x 2- last time > 5 years ago   Pneumonia 01/17/2012   10 yrs ago and 5 years ago    Rash and other nonspecific skin eruption 01/17/2012   Dorsum  hands & extensor surfaces arms; intermittent    Medications:  Medications Prior to Admission  Medication Sig Dispense Refill Last Dose   acetaZOLAMIDE ER (DIAMOX) 500 MG capsule Take 1 capsule by mouth 2 (two) times daily.      ADVAIR DISKUS 250-50 MCG/ACT AEPB Inhale 1 puff into the lungs in the morning and at bedtime. 60 each 2    albuterol (VENTOLIN HFA) 108 (90 Base) MCG/ACT inhaler Inhale 2 puffs into the lungs See admin instructions. Inhale 2 puffs every 4 to 6 hours as needed for shortness of breath 8 g 3    alendronate (FOSAMAX) 70 MG tablet Take 70 mg by mouth once a week.      ARIPiprazole (ABILIFY) 10 MG tablet Take 10 mg by mouth daily. Takes 1/2 daily  aspirin EC 81 MG tablet Take 81 mg by mouth daily. Swallow whole.      atorvastatin (LIPITOR) 40 MG tablet Take 40 mg by mouth daily.      azelastine (ASTELIN) 0.1 % nasal spray Place 2 sprays into both nostrils 2 (two) times daily.      CALCIUM PO Take 3 tablets by mouth daily.      Cholecalciferol 50 MCG (2000 UT) TABS Take 1 tablet by mouth daily at 6 (six) AM.      clonazePAM (KLONOPIN) 0.5 MG tablet Take 1 mg by mouth 2 (two) times daily.      dorzolamide-timolol (COSOPT) 22.3-6.8 MG/ML ophthalmic solution Place 1 drop into the right eye 2 (two) times daily.      ELDERBERRY PO Take 3 tablets by mouth daily.      labetalol (NORMODYNE) 100 MG tablet Take 1 tablet (100 mg total) by mouth 2 (two) times daily. 30 tablet 0    latanoprost (XALATAN) 0.005 % ophthalmic solution Place 1 drop into the right eye at bedtime.      levocetirizine (XYZAL) 5 MG tablet Take 5 mg by mouth in the morning.      levothyroxine (SYNTHROID) 125 MCG tablet Take 1 tablet (125 mcg total) by mouth daily at 6 (six) AM. 30 tablet 1    lisinopril (ZESTRIL) 20 MG tablet Take 1 tablet (20 mg total) by mouth daily. 30 tablet 2    Melatonin (MELATONIN EXTRA STRENGTH) 10 MG TABS Take 1 tablet by mouth at bedtime.      methylPREDNISolone (MEDROL DOSEPAK) 4 MG  TBPK tablet Medrol Dosepak take as instructed 21 tablet 0    montelukast (SINGULAIR) 10 MG tablet Take 10 mg by mouth at bedtime.      Multiple Vitamin (MULTI-VITAMIN) tablet Take 2 tablets by mouth daily.      nicotine (NICODERM CQ - DOSED IN MG/24 HOURS) 21 mg/24hr patch Place 1 patch (21 mg total) onto the skin daily. 28 patch 0    zolpidem (AMBIEN) 10 MG tablet Take 10 mg by mouth at bedtime as needed for sleep.       Assessment: Pharmacy consulted to dose heparin in patient with chest pain/ACS. Patient is not on anticoagulation prior to admission.  HL 0.28- slightly subtherapeutic  CBC WNL Trop 168  Goal of Therapy:  Heparin level 0.3-0.7 units/ml Monitor platelets by anticoagulation protocol: Yes   Plan:  Rebolus 850 units IV x 1 Increase heparin infusion to 950 units/hr Check anti-Xa level in 6-8 hours and daily Continue to monitor H&H and platelets.   Judeth Cornfield, PharmD Clinical Pharmacist 04/10/2023 9:45 PM

## 2023-04-10 NOTE — ED Triage Notes (Signed)
Pt BIB [facility] c/o difficulty breathing. Hx of COPD. Audible wheezing noted. Pt states she just started using at home oxygen, 3L [place]  125 solumedrol and 2.5 albuterol and duo neb given PTA

## 2023-04-10 NOTE — Progress Notes (Signed)
PHARMACIST - PHYSICIAN COMMUNICATION   CONCERNING: Methylprednisolone IV    Current order: Methylprednisolone IV 60 mg q12H     DESCRIPTION: Per [facility] Protocol:   IV methylprednisolone will be converted to either a q12h or q24h frequency with the same total daily dose (TDD).  Ordered Dose: 1 to 125 mg TDD; convert to: TDD q24h.  Ordered Dose: 126 to 250 mg TDD; convert to: TDD div q12h.  Ordered Dose: >250 mg TDD; DAW.  Order has been adjusted to: Methylprednisolone IV 120 mg q24H   Will M. Dareen Piano, PharmD PGY-1 Pharmacy Resident 04/10/2023 8:37 AM

## 2023-04-10 NOTE — ED Notes (Signed)
Some medications delayed due to verification from pharmacy

## 2023-04-11 ENCOUNTER — Other Ambulatory Visit (HOSPITAL_COMMUNITY): Payer: Self-pay | Admitting: *Deleted

## 2023-04-11 ENCOUNTER — Inpatient Hospital Stay (HOSPITAL_COMMUNITY): Payer: Medicare HMO

## 2023-04-11 DIAGNOSIS — R0609 Other forms of dyspnea: Secondary | ICD-10-CM

## 2023-04-11 DIAGNOSIS — J441 Chronic obstructive pulmonary disease with (acute) exacerbation: Secondary | ICD-10-CM | POA: Diagnosis not present

## 2023-04-11 DIAGNOSIS — R7989 Other specified abnormal findings of blood chemistry: Secondary | ICD-10-CM | POA: Diagnosis not present

## 2023-04-11 LAB — BASIC METABOLIC PANEL
Anion gap: 7 (ref 5–15)
BUN: 18 mg/dL (ref 8–23)
CO2: 20 mmol/L — ABNORMAL LOW (ref 22–32)
Calcium: 8.1 mg/dL — ABNORMAL LOW (ref 8.9–10.3)
Chloride: 103 mmol/L (ref 98–111)
Creatinine, Ser: 0.81 mg/dL (ref 0.44–1.00)
GFR, Estimated: >60 mL/min (ref >60)
Glucose, Bld: 75 mg/dL (ref 70–99)
Potassium: 3.6 mmol/L (ref 3.5–5.1)
Sodium: 130 mmol/L — ABNORMAL LOW (ref 135–145)

## 2023-04-11 LAB — CBC
HCT: 34.6 % — ABNORMAL LOW (ref 36.0–46.0)
Hemoglobin: 12 g/dL (ref 12.0–15.0)
MCH: 27.9 pg (ref 26.0–34.0)
MCHC: 34.7 g/dL (ref 30.0–36.0)
MCV: 80.5 fL (ref 80.0–100.0)
Platelets: 225 10*3/uL (ref 150–400)
RBC: 4.3 MIL/uL (ref 3.87–5.11)
RDW: 16.8 % — ABNORMAL HIGH (ref 11.5–15.5)
WBC: 9.3 10*3/uL (ref 4.0–10.5)
nRBC: 0 % (ref 0.0–0.2)

## 2023-04-11 LAB — ECHOCARDIOGRAM COMPLETE
Area-P 1/2: 2.17 cm2
Height: 67 in
S' Lateral: 2.6 cm
Weight: 1982.38 oz

## 2023-04-11 LAB — CULTURE, BLOOD (ROUTINE X 2): Culture: NO GROWTH

## 2023-04-11 LAB — HEPARIN LEVEL (UNFRACTIONATED)
Heparin Unfractionated: 0.24 IU/mL — ABNORMAL LOW (ref 0.30–0.70)
Heparin Unfractionated: 0.64 IU/mL (ref 0.30–0.70)

## 2023-04-11 LAB — MAGNESIUM: Magnesium: 2.2 mg/dL (ref 1.7–2.4)

## 2023-04-11 MED ORDER — LOSARTAN POTASSIUM 50 MG PO TABS
25.0000 mg | ORAL_TABLET | Freq: Every day | ORAL | Status: DC
  Administered 2023-04-12: 25 mg via ORAL
  Filled 2023-04-11: qty 1

## 2023-04-11 MED ORDER — PANTOPRAZOLE SODIUM 40 MG PO TBEC
40.0000 mg | DELAYED_RELEASE_TABLET | Freq: Two times a day (BID) | ORAL | Status: DC
  Administered 2023-04-11 – 2023-04-12 (×3): 40 mg via ORAL
  Filled 2023-04-11 (×3): qty 1

## 2023-04-11 MED ORDER — ENOXAPARIN SODIUM 40 MG/0.4ML IJ SOSY
40.0000 mg | PREFILLED_SYRINGE | INTRAMUSCULAR | Status: DC
  Administered 2023-04-11: 40 mg via SUBCUTANEOUS
  Filled 2023-04-11: qty 0.4

## 2023-04-11 NOTE — Progress Notes (Signed)
ANTICOAGULATION CONSULT NOTE Pharmacy Consult for heparin Indication: chest pain/ACS  Allergies  Allergen Reactions   Sulfa Antibiotics Rash    Patient Measurements: Height: 5\' 7"  (170.2 cm) Weight: 56.2 kg (123 lb 14.4 oz) IBW/kg (Calculated) : 61.6 Heparin Dosing Weight: 57 kg   Vital Signs: Temp: 98.2 F (36.8 C) (06/19 0517) Temp Source: Oral (06/19 0517) BP: 129/67 (06/19 0517) Pulse Rate: 77 (06/19 0517)  Labs: Recent Labs    04/10/23 0431 04/10/23 0630 04/10/23 0836 04/10/23 0951 04/10/23 1356 04/10/23 2058 04/11/23 0419 04/11/23 1251  HGB 13.7  --   --   --   --   --  12.0  --   HCT 39.4  --   --   --   --   --  34.6*  --   PLT 218  --   --   --   --   --  225  --   APTT 29  --   --   --   --   --   --   --   LABPROT 13.2  --   --   --   --   --   --   --   INR 1.0  --   --   --   --   --   --   --   HEPARINUNFRC  --   --   --   --    < > 0.28* 0.24* 0.64  CREATININE 0.78  --   --   --   --   --  0.81  --   TROPONINIHS 81* 168* 177* 147*  --   --   --   --    < > = values in this interval not displayed.    Estimated Creatinine Clearance: 61.4 mL/min (by C-G formula based on SCr of 0.81 mg/dL).  Assessment: 67 y.o. female presented to [facility] with chest pain and possible ACS. Heparin level currently at goal after rate adjustment this morning. No bleeding or IV issues noted.   Goal of Therapy:  Heparin level 0.3-0.7 units/ml Monitor platelets by anticoagulation protocol: Yes   Plan:  Continue heparin at 1100 units/hr Daily heparin level and cbc for now Echo pending  Sheppard Coil PharmD., BCPS Clinical Pharmacist 04/11/2023 1:28 PM

## 2023-04-11 NOTE — Progress Notes (Signed)
*  PRELIMINARY RESULTS* Echocardiogram 2D Echocardiogram has been performed.  Stacey Drain 04/11/2023, 12:13 PM

## 2023-04-11 NOTE — Progress Notes (Signed)
Pt had a restful evening, pt feels SOB is improved however still has audible wheezing, remains on 3L . Heparin lvl 0.28, bolus given and dose increased, no s/s of bleeding or complications.

## 2023-04-11 NOTE — Progress Notes (Signed)
ANTICOAGULATION CONSULT NOTE Pharmacy Consult for heparin Indication: chest pain/ACS Brief A/P: Heparin level subtherapeutic Increase Heparin rate  Allergies  Allergen Reactions   Sulfa Antibiotics Rash    Patient Measurements: Height: 5\' 7"  (170.2 cm) Weight: 56.2 kg (123 lb 14.4 oz) IBW/kg (Calculated) : 61.6 Heparin Dosing Weight: 57 kg   Vital Signs: Temp: 98.2 F (36.8 C) (06/19 0517) Temp Source: Oral (06/19 0517) BP: 129/67 (06/19 0517) Pulse Rate: 77 (06/19 0517)  Labs: Recent Labs    04/10/23 0431 04/10/23 0630 04/10/23 0836 04/10/23 0951 04/10/23 1356 04/10/23 2058 04/11/23 0419  HGB 13.7  --   --   --   --   --   --   HCT 39.4  --   --   --   --   --   --   PLT 218  --   --   --   --   --   --   APTT 29  --   --   --   --   --   --   LABPROT 13.2  --   --   --   --   --   --   INR 1.0  --   --   --   --   --   --   HEPARINUNFRC  --   --   --   --  0.16* 0.28* 0.24*  CREATININE 0.78  --   --   --   --   --  0.81  TROPONINIHS 81* 168* 177* 147*  --   --   --      Estimated Creatinine Clearance: 61.4 mL/min (by C-G formula based on SCr of 0.81 mg/dL).  Assessment: 67 y.o. female with chest pain for heparin   Goal of Therapy:  Heparin level 0.3-0.7 units/ml Monitor platelets by anticoagulation protocol: Yes   Plan:  Increase Heparin 1100 units/hr  Geannie Risen, PharmD, BCPS  04/11/2023 6:25 AM

## 2023-04-11 NOTE — Progress Notes (Signed)
Progress Note   Patient: Dorothy [name] EXB:[id number] DOB: 1955-12-21 DOA: 04/10/2023     1 DOS: the patient was seen and examined on 04/11/2023   Brief hospital admission narrative: As per H&P written by Dr. Sherryll Burger on 04/10/2023 Dorothy [name] is a 67 y.o. female with medical history significant for COPD, tobacco abuse, anxiety/depression, hypertension, hypothyroidism, GERD, and dyslipidemia who was recently discharged on 5/27 after treatment for hypertensive crisis.  She presented to the ED with worsening shortness of breath over the last 2 weeks that worsened last night and prompted a call to EMS.  She was recently started on home oxygen per her primary care physician.  She denies a significant cough, fevers, chills, chest pain, or lower extremity edema.  She had significant wheezing requiring nonrebreather mask and nebulizer treatments on arrival.  She continues to smoke 1 pack/day.   ED Course: Vital signs with temperature 100.3 and otherwise stable heart rate and blood pressure.  Leukocytosis of 12,200 and sodium 128.  Troponin 81 and follow-up 168.  EKG with no significant findings aside from sinus tachycardia at 110 bpm.  Assessment and Plan: 1-COPD exacerbation acute -2 L nasal cannula supplementation in place -Reporting feeling better and almost able to speak in full sentences -Patient denies chest pain -Continue nebulizer management, steroids, Protonix twice a day, flutter valve and mucolytic's. -Lisinopril has been substituted to losartan -Continue to follow clinical response. -Continue Zithromax for bronchiectasis.  2-troponin elevation -No chest pain reported -Most likely demand ischemia -Telemetry with no acute abnormalities -2D echo reassuring with preserved ejection fraction and no wall motion abnormalities. -Patient received over 36 hours of heparin drip empirically -Continue treatment for COPD exacerbation and bronchiectasis  3-tobacco abuse -Cessation counseling  provided -Continue nicotine patch.  4-hypertension -Continue home antihypertensive agents -Stable blood pressure appreciated. -Lisinopril substituted by losartan.  5-GERD -Continue PPI.  6-dyslipidemia -Continue statin.  7-anxiety/depression -Continue Abilify and Klonopin. -Stable mood.  8-hypothyroidism -Continue Synthroid  9-history of chronic hyponatremia -Continue to follow electrolytes -Patient advised to maintain adequate hydration. -recently discontinuation of effexor and hydrochlorothiazide.   Subjective:  Demonstrating difficulties speaking in full sentences and reporting intermittent coughing spells. 2 L [place] in place.  Physical Exam: Vitals:   04/11/23 0517 04/11/23 0700 04/11/23 1405 04/11/23 1435  BP: 129/67   (!) 116/95  Pulse: 77   80  Resp: 20     Temp: 98.2 F (36.8 C)   (!) 97.5 F (36.4 C)  TempSrc: Oral   Axillary  SpO2: 97% 95% 96% 97%  Weight:      Height:       General exam: Alert, awake, oriented x 3; short winded with activity and demonstrating mild difficulty speaking in full sentences.  No chest pain, no nausea or vomiting.  Overall feeling better. Respiratory system: Positive expiratory wheezing and rhonchi on auscultation; no using accessory muscle. Cardiovascular system:RRR. No murmurs, rubs, gallops.  No JVD. Gastrointestinal system: Abdomen is nondistended, soft and nontender. No organomegaly or masses felt. Normal bowel sounds heard. Central nervous system: Alert and oriented. No focal neurological deficits. Extremities: No cyanosis or clubbing. Skin: No petechiae. Psychiatry: Judgement and insight appear normal. Mood & affect appropriate.   Data Reviewed: Magnesium: 2.2 Basic metabolic panel: Sodium 130, potassium 3.6, chloride 103, bicarb 20, BUN 18, creatinine 0.81 CBC: WBCs 9.3, hemoglobin 12.0 and platelet count 220 5K  Family Communication: Whole family at bedside.  Disposition: Status is: Inpatient Remains inpatient  appropriate because: Continue IV steroids and treatment for  COPD exacerbation.   Planned Discharge Destination: Home   Time spent: 35 minutes  Author: Vassie Loll, MD 04/11/2023 3:18 PM  For on call review [email or web address].

## 2023-04-12 DIAGNOSIS — J471 Bronchiectasis with (acute) exacerbation: Secondary | ICD-10-CM

## 2023-04-12 DIAGNOSIS — K219 Gastro-esophageal reflux disease without esophagitis: Secondary | ICD-10-CM | POA: Diagnosis not present

## 2023-04-12 DIAGNOSIS — R7989 Other specified abnormal findings of blood chemistry: Secondary | ICD-10-CM

## 2023-04-12 DIAGNOSIS — I10 Essential (primary) hypertension: Secondary | ICD-10-CM | POA: Diagnosis not present

## 2023-04-12 DIAGNOSIS — J441 Chronic obstructive pulmonary disease with (acute) exacerbation: Secondary | ICD-10-CM | POA: Diagnosis not present

## 2023-04-12 LAB — CBC
HCT: 32.8 % — ABNORMAL LOW (ref 36.0–46.0)
Hemoglobin: 11.7 g/dL — ABNORMAL LOW (ref 12.0–15.0)
MCH: 28.3 pg (ref 26.0–34.0)
MCHC: 35.7 g/dL (ref 30.0–36.0)
MCV: 79.4 fL — ABNORMAL LOW (ref 80.0–100.0)
Platelets: 221 10*3/uL (ref 150–400)
RBC: 4.13 MIL/uL (ref 3.87–5.11)
RDW: 17 % — ABNORMAL HIGH (ref 11.5–15.5)
WBC: 7.6 10*3/uL (ref 4.0–10.5)
nRBC: 0 % (ref 0.0–0.2)

## 2023-04-12 LAB — CULTURE, BLOOD (ROUTINE X 2)

## 2023-04-12 MED ORDER — CEFDINIR 300 MG PO CAPS: 300.0000 mg | ORAL_CAPSULE | Freq: Two times a day (BID) | ORAL | 0 refills | Status: AC

## 2023-04-12 MED ORDER — PREDNISONE 20 MG PO TABS: ORAL_TABLET | ORAL | 0 refills | Status: DC

## 2023-04-12 MED ORDER — LABETALOL HCL 100 MG PO TABS: 100.0000 mg | ORAL_TABLET | Freq: Two times a day (BID) | ORAL | 1 refills | Status: DC

## 2023-04-12 MED ORDER — PANTOPRAZOLE SODIUM 40 MG PO TBEC: 40.0000 mg | DELAYED_RELEASE_TABLET | Freq: Two times a day (BID) | ORAL | 1 refills | Status: AC

## 2023-04-12 MED ORDER — NICOTINE 21 MG/24HR TD PT24: 21.0000 mg | MEDICATED_PATCH | Freq: Every day | TRANSDERMAL | 2 refills | Status: DC

## 2023-04-12 MED ORDER — TRELEGY ELLIPTA 200-62.5-25 MCG/ACT IN AEPB: 1.0000 | INHALATION_SPRAY | Freq: Every day | RESPIRATORY_TRACT | 1 refills | Status: DC

## 2023-04-12 MED ORDER — LOSARTAN POTASSIUM 25 MG PO TABS: 25.0000 mg | ORAL_TABLET | Freq: Every day | ORAL | 2 refills | Status: DC

## 2023-04-12 NOTE — Care Management Important Message (Signed)
Important Message  Patient Details  Name: Dorothy [name] MRN: [id number] Date of Birth: [birth year]   Medicare Important Message Given:  N/A - LOS <3 / Initial given by admissions     Corey Harold 04/12/2023, 9:43 AM

## 2023-04-12 NOTE — Discharge Summary (Signed)
Physician Discharge Summary   Patient: Dorothy [name] MRN: [id number] DOB: 22-Jan-1956  Admit date:     04/10/2023  Discharge date: 04/12/23  Discharge Physician: Vassie Loll   PCP: Erasmo Downer, NP   Recommendations at discharge:  Repeat basic metabolic panel to follow electrolytes and renal function Make sure patient has follow-up with pulmonologist as instructed Reassess blood pressure and adjust antihypertensive treatment as needed Continue assisting patient with a smoking cessation  Discharge Diagnoses: Principal Problem:   COPD exacerbation (HCC) Active Problems:   Primary hypertension   Gastroesophageal reflux disease   Elevated troponin   Bronchiectasis with acute exacerbation [facility])  Brief Hospital admission narrative course: As per H&P written by Dr. Sherryll Burger on 04/10/2023 Dorothy [name] is a 67 y.o. female with medical history significant for COPD, tobacco abuse, anxiety/depression, hypertension, hypothyroidism, GERD, and dyslipidemia who was recently discharged on 5/27 after treatment for hypertensive crisis.  She presented to the ED with worsening shortness of breath over the last 2 weeks that worsened last night and prompted a call to EMS.  She was recently started on home oxygen per her primary care physician.  She denies a significant cough, fevers, chills, chest pain, or lower extremity edema.  She had significant wheezing requiring nonrebreather mask and nebulizer treatments on arrival.  She continues to smoke 1 pack/day.   ED Course: Vital signs with temperature 100.3 and otherwise stable heart rate and blood pressure.  Leukocytosis of 12,200 and sodium 128.  Troponin 81 and follow-up 168.  EKG with no significant findings aside from sinus tachycardia at 110 bpm.  Assessment and Plan: 1-COPD exacerbation acute/chronic respiratory failure -2 L nasal cannula supplementation in place -Patient denies chest pain and was able to speak in full sentences at time of  discharge. -Continue nebulizer as needed management, steroids tapering, Protonix twice a day, flutter valve and mucolytic's. -Outpatient follow-up with pulmonologist for PFTs and adjustment to maintenance therapy recommended; for now continue the use of trilogy. -Lisinopril has been substituted to losartan; minimizing bronchospasm component. -Continue to follow clinical response. -Continue oral antibiotics at time of discharge for treatment of bronchiectasis. -Continue home oxygen supplementation (2 L at baseline).   2-troponin elevation -No chest pain reported -Most likely demand ischemia -Telemetry and EKG with no acute abnormalities -2D echo reassuring with preserved ejection fraction and no wall motion abnormalities. -Patient received over 36 hours of heparin drip empirically -Continue treatment for COPD exacerbation and bronchiectasis   3-tobacco abuse -Cessation counseling provided -Continue nicotine patch. -Patient looking to stop smoking.   4-hypertension -Continue home antihypertensive agents -Stable blood pressure appreciated. -Lisinopril substituted by losartan.   5-GERD -Continue PPI.   6-dyslipidemia -Continue statin. -Heart healthy diet discussed with patient.   7-anxiety/depression -Continue Abilify and Klonopin. -Stable mood. -No suicidal ideation or hallucinations   8-hypothyroidism -Continue Synthroid -Continue to follow intermittent thyroid panel as an outpatient to further adjust Synthroid as needed.   9-history of chronic hyponatremia -Continue to follow electrolytes -Patient advised to maintain adequate hydration. -recently discontinuation of effexor and hydrochlorothiazide.  Consultants: None Procedures performed: See below for x-ray reports Disposition: Home Diet recommendation: Heart healthy diet.  DISCHARGE MEDICATION: Allergies as of 04/12/2023       Reactions   Sulfa Antibiotics Rash        Medication List     STOP taking  these medications    Advair Diskus 250-50 MCG/ACT Aepb Generic drug: fluticasone-salmeterol   ELDERBERRY PO   lisinopril 20 MG tablet Commonly known  as: ZESTRIL   methylPREDNISolone 4 MG Tbpk tablet Commonly known as: MEDROL DOSEPAK       TAKE these medications    acetaZOLAMIDE ER 500 MG capsule Commonly known as: DIAMOX Take 1 capsule by mouth 2 (two) times daily.   albuterol 108 (90 Base) MCG/ACT inhaler Commonly known as: VENTOLIN HFA Inhale 2 puffs into the lungs See admin instructions. Inhale 2 puffs every 4 to 6 hours as needed for shortness of breath   alendronate 70 MG tablet Commonly known as: FOSAMAX Take 70 mg by mouth once a week.   ARIPiprazole 10 MG tablet Commonly known as: ABILIFY Take 5 mg by mouth daily. Takes 1/2 daily   aspirin EC 81 MG tablet Take 81 mg by mouth daily. Swallow whole.   atorvastatin 40 MG tablet Commonly known as: LIPITOR Take 40 mg by mouth daily.   azelastine 0.1 % nasal spray Commonly known as: ASTELIN Place 2 sprays into both nostrils 2 (two) times daily.   brimonidine 0.2 % ophthalmic solution Commonly known as: ALPHAGAN Place 1 drop into the right eye 3 (three) times daily.   CALCIUM PO Take 3 tablets by mouth daily.   cefdinir 300 MG capsule Commonly known as: OMNICEF Take 1 capsule (300 mg total) by mouth 2 (two) times daily for 5 days.   Cholecalciferol 50 MCG (2000 UT) Tabs Take 1 tablet by mouth daily at 6 (six) AM.   clonazePAM 0.5 MG tablet Commonly known as: KLONOPIN Take 1 mg by mouth 2 (two) times daily.   dorzolamide-timolol 2-0.5 % ophthalmic solution Commonly known as: COSOPT Place 1 drop into the right eye 2 (two) times daily.   ipratropium-albuterol 0.5-2.5 (3) MG/3ML Soln Commonly known as: DUONEB Take 3 mLs by nebulization every 6 (six) hours as needed.   labetalol 100 MG tablet Commonly known as: NORMODYNE Take 1 tablet (100 mg total) by mouth 2 (two) times daily.   latanoprost  0.005 % ophthalmic solution Commonly known as: XALATAN Place 1 drop into the right eye at bedtime.   levocetirizine 5 MG tablet Commonly known as: XYZAL Take 5 mg by mouth in the morning.   levothyroxine 125 MCG tablet Commonly known as: SYNTHROID Take 1 tablet (125 mcg total) by mouth daily at 6 (six) AM.   losartan 25 MG tablet Commonly known as: COZAAR Take 1 tablet (25 mg total) by mouth daily.   Melatonin Extra Strength 10 MG Tabs Generic drug: Melatonin Take 1 tablet by mouth at bedtime.   montelukast 10 MG tablet Commonly known as: SINGULAIR Take 10 mg by mouth at bedtime.   Multi-Vitamin tablet Take 2 tablets by mouth daily.   nicotine 21 mg/24hr patch Commonly known as: NICODERM CQ - dosed in mg/24 hours Place 1 patch (21 mg total) onto the skin daily.   pantoprazole 40 MG tablet Commonly known as: PROTONIX Take 1 tablet (40 mg total) by mouth 2 (two) times daily.   predniSONE 20 MG tablet Commonly known as: DELTASONE Take 3 tablets by mouth daily x 1 day; then 2 tablet by mouth daily x 2 days; then 1 tablet by mouth daily x 3 days; then half tablet by mouth daily x 3 days and stop prednisone.   Trelegy Ellipta 200-62.5-25 MCG/ACT Aepb Generic drug: Fluticasone-Umeclidin-Vilant Take 1 puff by mouth daily.   zolpidem 10 MG tablet Commonly known as: AMBIEN Take 10 mg by mouth at bedtime as needed for sleep.        Follow-up Information  Erasmo Downer, NP. Schedule an appointment as soon as possible for a visit in 10 day(s).   Specialty: Nurse Practitioner Contact information: [address] [place] [address] [phone]                Discharge Exam: Ceasar Mons Weights   04/10/23 0336 04/10/23 1122  Weight: 57.3 kg 56.2 kg   General exam: Alert, awake, oriented x 3; speaking in full sentences and demonstrating good saturation on 2 L chronic supplementation.  Patient ready to go home.  No chest pain, no nausea, no  vomiting. Respiratory system: Improved air movement bilaterally; very little expiratory wheezing appreciated on exam.  No using accessory muscles. Cardiovascular system:RRR. No rubs or gallops; no JVD. Gastrointestinal system: Abdomen is nondistended, soft and nontender. No organomegaly or masses felt. Normal bowel sounds heard. Central nervous system: Alert and oriented. No focal neurological deficits. Extremities: No cyanosis, clubbing or edema. Skin: No petechiae. Psychiatry: Judgement and insight appear normal. Mood & affect appropriate.    Condition at discharge: Stable and improved.  The results of significant diagnostics from this hospitalization (including imaging, microbiology, ancillary and laboratory) are listed below for reference.   Imaging Studies: ECHOCARDIOGRAM COMPLETE  Result Date: 04/11/2023    ECHOCARDIOGRAM REPORT   Patient Name:   MADELINN SPINLER Date of Exam: 04/11/2023 Medical Rec #:  [id number]        Height:       67.0 in Accession #:    [id number]       Weight:       123.9 lb Date of Birth:  April 27, 1956        BSA:          1.650 m Patient Age:    66 years         BP:           129/67 mmHg Patient Gender: F                HR:           77 bpm. [facility] Procedure: 2D Echo, Cardiac Doppler and Color Doppler Indications:    Elevated Troponin  History:        Patient has no prior history of Echocardiogram examinations.                 COPD; Risk Factors:Hypertension and Current Smoker.  Sonographer:    Celesta Gentile RCS Referring Phys: [id number] PRATIK D [facility] IMPRESSIONS  1. Left ventricular ejection fraction, by estimation, is 70 to 75%. The left ventricle has hyperdynamic function. The left ventricle has no regional wall motion abnormalities. There is mild left ventricular hypertrophy. Left ventricular diastolic parameters were normal.  2. Right ventricular systolic function is normal. The right ventricular size is normal. Tricuspid regurgitation signal is  inadequate for assessing PA pressure.  3. The mitral valve is grossly normal. No evidence of mitral valve regurgitation.  4. The aortic valve is tricuspid. Aortic valve regurgitation is not visualized.  5. The inferior vena cava is normal in size with greater than 50% respiratory variability, suggesting right atrial pressure of 3 mmHg. Comparison(s): No prior Echocardiogram. FINDINGS  Left Ventricle: Left ventricular ejection fraction, by estimation, is 70 to 75%. The left ventricle has hyperdynamic function. The left ventricle has no regional wall motion abnormalities. The left ventricular internal cavity size was normal in size. There is mild left ventricular hypertrophy. Left ventricular diastolic parameters were normal. Right Ventricle: The right ventricular size  is normal. No increase in right ventricular wall thickness. Right ventricular systolic function is normal. Tricuspid regurgitation signal is inadequate for assessing PA pressure. Left Atrium: Left atrial size was normal in size. Right Atrium: Right atrial size was normal in size. Pericardium: There is no evidence of pericardial effusion. Presence of epicardial fat layer. Mitral Valve: The mitral valve is grossly normal. No evidence of mitral valve regurgitation. Tricuspid Valve: The tricuspid valve is grossly normal. Tricuspid valve regurgitation is trivial. Aortic Valve: The aortic valve is tricuspid. There is mild aortic valve annular calcification. Aortic valve regurgitation is not visualized. Pulmonic Valve: The pulmonic valve was not well visualized. Pulmonic valve regurgitation is trivial. Aorta: The aortic root is normal in size and structure. Venous: The inferior vena cava is normal in size with greater than 50% respiratory variability, suggesting right atrial pressure of 3 mmHg. IAS/Shunts: The interatrial septum appears to be lipomatous. No atrial level shunt detected by color flow Doppler.  LEFT VENTRICLE PLAX 2D LVIDd:         4.00 cm    Diastology LVIDs:         2.60 cm   LV e' medial:    7.51 cm/s LV PW:         1.10 cm   LV E/e' medial:  10.0 LV IVS:        1.00 cm   LV e' lateral:   7.51 cm/s LVOT diam:     1.70 cm   LV E/e' lateral: 10.0 LV SV:         71 LV SV Index:   43 LVOT Area:     2.27 cm  RIGHT VENTRICLE RV S prime:     9.90 cm/s TAPSE (M-mode): 2.0 cm LEFT ATRIUM             Index        RIGHT ATRIUM          Index LA diam:        2.50 cm 1.52 cm/m   RA Area:     8.74 cm LA Vol (A2C):   32.8 ml 19.88 ml/m  RA Volume:   18.40 ml 11.15 ml/m LA Vol (A4C):   40.3 ml 24.43 ml/m LA Biplane Vol: 36.4 ml 22.06 ml/m  AORTIC VALVE LVOT Vmax:   148.00 cm/s LVOT Vmean:  102.000 cm/s LVOT VTI:    0.312 m  AORTA Ao Root diam: 2.80 cm MITRAL VALVE MV Area (PHT): 2.17 cm    SHUNTS MV Decel Time: 349 msec    Systemic VTI:  0.31 m MV E velocity: 75.30 cm/s  Systemic Diam: 1.70 cm MV A velocity: 58.80 cm/s MV E/A ratio:  1.28 Nona Dell MD Electronically signed by Nona Dell MD Signature Date/Time: 04/11/2023/3:01:33 PM    Final    DG Chest 2 View  Result Date: 04/10/2023 CLINICAL DATA:  COPD with difficulty breathing EXAM: CHEST - 2 VIEW COMPARISON:  03/16/2023 FINDINGS: Artifact from EKG leads. Normal heart size and mediastinal contours. Hyperinflation. There is no edema, consolidation, effusion, or pneumothorax. IMPRESSION: COPD without acute superimposed finding. Electronically Signed   By: Tiburcio Pea M.D.   On: 04/10/2023 04:27   CT HEAD WO CONTRAST  Result Date: 03/21/2023 CLINICAL DATA:  Altered mental status EXAM: CT HEAD WITHOUT CONTRAST TECHNIQUE: Contiguous axial images were obtained from the base of the skull through the vertex without intravenous contrast. RADIATION DOSE REDUCTION: This exam was performed according to the departmental dose-optimization program which includes  automated exposure control, adjustment of the mA and/or kV according to patient size and/or use of iterative reconstruction technique.  COMPARISON:  CT Head 01/24/22 FINDINGS: Brain: Chronic right occipital lobe infarct, unchanged from prior exam. No CT evidence of a new cortical infarct. No hemorrhage. No hydrocephalus. No extra-axial fluid collection. Vascular: No hyperdense vessel or unexpected calcification. Skull: Normal. Negative for fracture or focal lesion. Sinuses/Orbits: No middle ear or mastoid effusion. Paranasal sinuses are clear. Bilateral lens replacement. Orbits are otherwise unremarkable. Other: None. IMPRESSION: 1. No acute intracranial abnormality. 2. Chronic right occipital lobe infarct, unchanged from prior exam. Electronically Signed   By: Lorenza Cambridge M.D.   On: 03/21/2023 15:59   CT Angio Chest Pulmonary Embolism (PE) W or WO Contrast  Result Date: 03/17/2023 CLINICAL DATA:  Rule out pulmonary embolism. EXAM: CT ANGIOGRAPHY CHEST WITH CONTRAST TECHNIQUE: Multidetector CT imaging of the chest was performed using the standard protocol during bolus administration of intravenous contrast. Multiplanar CT image reconstructions and MIPs were obtained to evaluate the vascular anatomy. RADIATION DOSE REDUCTION: This exam was performed according to the departmental dose-optimization program which includes automated exposure control, adjustment of the mA and/or kV according to patient size and/or use of iterative reconstruction technique. CONTRAST:  75mL OMNIPAQUE IOHEXOL 350 MG/ML SOLN COMPARISON:  LDCT 12/06/2021 CTA chest 08/18/2016 FINDINGS: Cardiovascular: Satisfactory opacification of the pulmonary arteries to the segmental level. No evidence of pulmonary embolism. Normal heart size. No pericardial effusion. Aortic atherosclerosis and coronary artery calcifications. Mediastinum/Nodes: No enlarged mediastinal, hilar, or axillary lymph nodes. Thyroid gland, trachea, and esophagus demonstrate no significant findings. Lungs/Pleura: No pleural effusion, airspace consolidation or pneumothorax. Centrilobular emphysema and diffuse  bronchial wall thickening. Stable subpleural nodule in the periphery of the right middle lobe which measures 4 mm on today's study. Upper Abdomen: No acute findings identified. 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 Several simple cysts are identified in the left lobe and appears stable from previous exam. Unchanged bilateral adrenal nodules compatible with benign adenomas. No follow-up imaging recommended. Musculoskeletal: No chest wall abnormality. No acute or significant osseous findings. Review of the MIP images confirms the above findings. IMPRESSION: 1. No evidence for acute pulmonary embolism. 2. Diffuse bronchial wall thickening with emphysema, as above; imaging findings suggestive of underlying COPD. 3. Stable 4 mm right middle lobe lung nodule. This is compatible with a benign nodule. No follow-up imaging recommended. 4. Aortic Atherosclerosis (ICD10-I70.0) and Emphysema (ICD10-J43.9). Electronically Signed   By: Signa Kell M.D.   On: 03/17/2023 10:43   DG Chest 2 View  Result Date: 03/16/2023 CLINICAL DATA:  Wheezing. Hyponatremia. Rule out mass. EXAM: CHEST - 2 VIEW COMPARISON:  Chest radiograph 11/09/2021. CT 12/06/2021 FINDINGS: Emphysema with mild hyperinflation. Chronic bronchial thickening. The heart is normal in size. Aortic atherosclerosis. No evidence of pulmonary mass or nodule by radiograph. No focal airspace disease. No pleural effusion or pneumothorax. Normal pulmonary vasculature. IMPRESSION: 1. Emphysema and chronic bronchial thickening. 2. No evidence of pulmonary mass or nodule. Electronically Signed   By: Narda Rutherford M.D.   On: 03/16/2023 19:06    Microbiology: Results for orders placed or performed during the hospital encounter of 04/10/23  Blood Culture (routine x 2)     Status: None (Preliminary result)   Collection Time: 04/10/23  4:27 AM   Specimen: BLOOD  Result Value Ref Range Status   Specimen  Description BLOOD BLOOD RIGHT ARM  Final   Special Requests   Final  BOTTLES DRAWN AEROBIC AND ANAEROBIC Blood Culture adequate volume   Culture   Final    NO GROWTH 1 DAY Performed at [facility], [address], [place], [place] [address]    Report Status PENDING  Incomplete  Blood Culture (routine x 2)     Status: None (Preliminary result)   Collection Time: 04/10/23  4:31 AM   Specimen: BLOOD  Result Value Ref Range Status   Specimen Description BLOOD BLOOD RIGHT HAND  Final   Special Requests   Final    BOTTLES DRAWN AEROBIC AND ANAEROBIC Blood Culture adequate volume   Culture   Final    NO GROWTH 1 DAY Performed at [facility], [address] [place], [place] [address]    Report Status PENDING  Incomplete  Resp panel by RT-PCR (RSV, Flu A&B, Covid) Anterior Nasal Swab     Status: None   Collection Time: 04/10/23  4:50 AM   Specimen: Anterior Nasal Swab  Result Value Ref Range Status   SARS Coronavirus 2 by RT PCR NEGATIVE NEGATIVE Final    Comment: (NOTE) SARS-CoV-2 target nucleic acids are NOT DETECTED.  The SARS-CoV-2 RNA is generally detectable in upper respiratory specimens during the acute phase of infection. The lowest concentration of SARS-CoV-2 viral copies this assay can detect is 138 copies/mL. A negative result does not preclude SARS-Cov-2 infection and should not be used as the sole basis for treatment or other patient management decisions. A negative result may occur with  improper specimen collection/handling, submission of specimen other than nasopharyngeal swab, presence of viral mutation(s) within the areas targeted by this assay, and inadequate number of viral copies(<138 copies/mL). A negative result must be combined with clinical observations, patient history, and epidemiological information. The expected result is Negative.  Fact Sheet for Patients:  BloggerCourse.com  Fact Sheet for Healthcare Providers:   SeriousBroker.it  This test is no t yet approved or cleared by the [place] FDA and  has been authorized for detection and/or diagnosis of SARS-CoV-2 by FDA under an Emergency Use Authorization (EUA). This EUA will remain  in effect (meaning this test can be used) for the duration of the COVID-19 declaration under Section 564(b)(1) of the Act, 21 U.S.C.section 360bbb-3(b)(1), unless the authorization is terminated  or revoked sooner.       Influenza A by PCR NEGATIVE NEGATIVE Final   Influenza B by PCR NEGATIVE NEGATIVE Final    Comment: (NOTE) The Xpert Xpress SARS-CoV-2/FLU/RSV plus assay is intended as an aid in the diagnosis of influenza from Nasopharyngeal swab specimens and should not be used as a sole basis for treatment. Nasal washings and aspirates are unacceptable for Xpert Xpress SARS-CoV-2/FLU/RSV testing.  Fact Sheet for Patients: BloggerCourse.com  Fact Sheet for Healthcare Providers: SeriousBroker.it  This test is not yet approved or cleared by the [place] FDA and has been authorized for detection and/or diagnosis of SARS-CoV-2 by FDA under an Emergency Use Authorization (EUA). This EUA will remain in effect (meaning this test can be used) for the duration of the COVID-19 declaration under Section 564(b)(1) of the Act, 21 U.S.C. section 360bbb-3(b)(1), unless the authorization is terminated or revoked.     Resp Syncytial Virus by PCR NEGATIVE NEGATIVE Final    Comment: (NOTE) Fact Sheet for Patients: BloggerCourse.com  Fact Sheet for Healthcare Providers: SeriousBroker.it  This test is not yet approved or cleared by the [place] FDA and has been authorized for detection and/or diagnosis of SARS-CoV-2 by FDA under an Emergency  Use Authorization (EUA). This EUA will remain in effect (meaning this test can be used) for  the duration of the COVID-19 declaration under Section 564(b)(1) of the Act, 21 U.S.C. section 360bbb-3(b)(1), unless the authorization is terminated or revoked.  Performed at [facility], [address], [place], [place] [address]     Labs: CBC: Recent Labs  Lab 04/10/23 0431 04/11/23 0419 04/12/23 0417  WBC 12.2* 9.3 7.6  NEUTROABS 10.2*  --   --   HGB 13.7 12.0 11.7*  HCT 39.4 34.6* 32.8*  MCV 80.4 80.5 79.4*  PLT 218 225 221   Basic Metabolic Panel: Recent Labs  Lab 04/10/23 0431 04/11/23 0419  NA 128* 130*  K 3.8 3.6  CL 103 103  CO2 17* 20*  GLUCOSE 104* 75  BUN 14 18  CREATININE 0.78 0.81  CALCIUM 8.0* 8.1*  MG  --  2.2   Liver Function Tests: Recent Labs  Lab 04/10/23 0431  AST 17  ALT 25  ALKPHOS 55  BILITOT 0.9  PROT 6.7  ALBUMIN 3.8   CBG: No results for input(s): "GLUCAP" in the last 168 hours.  Discharge time spent: greater than 30 minutes.  Signed: Vassie Loll, MD [facility] 04/12/2023

## 2023-04-14 LAB — CULTURE, BLOOD (ROUTINE X 2)
Special Requests: ADEQUATE
Special Requests: ADEQUATE

## 2023-04-15 LAB — CULTURE, BLOOD (ROUTINE X 2): Culture: NO GROWTH

## 2023-04-18 ENCOUNTER — Emergency Department (HOSPITAL_COMMUNITY): Payer: Medicare HMO

## 2023-04-18 ENCOUNTER — Other Ambulatory Visit: Payer: Self-pay

## 2023-04-18 ENCOUNTER — Encounter (HOSPITAL_COMMUNITY): Payer: Self-pay

## 2023-04-18 ENCOUNTER — Emergency Department (HOSPITAL_COMMUNITY)
Admission: EM | Admit: 2023-04-18 | Discharge: 2023-04-18 | Disposition: A | Discharge status: Home / Self Care | Payer: Medicare HMO | Attending: Emergency Medicine | Admitting: Emergency Medicine

## 2023-04-18 DIAGNOSIS — Z20822 Contact with and (suspected) exposure to covid-19: Secondary | ICD-10-CM | POA: Insufficient documentation

## 2023-04-18 DIAGNOSIS — J449 Chronic obstructive pulmonary disease, unspecified: Secondary | ICD-10-CM | POA: Insufficient documentation

## 2023-04-18 DIAGNOSIS — Z7982 Long term (current) use of aspirin: Secondary | ICD-10-CM | POA: Diagnosis not present

## 2023-04-18 DIAGNOSIS — R0602 Shortness of breath: Secondary | ICD-10-CM | POA: Diagnosis present

## 2023-04-18 DIAGNOSIS — Z8543 Personal history of malignant neoplasm of ovary: Secondary | ICD-10-CM | POA: Diagnosis not present

## 2023-04-18 LAB — BASIC METABOLIC PANEL
Anion gap: 9 (ref 5–15)
BUN: 16 mg/dL (ref 8–23)
CO2: 21 mmol/L — ABNORMAL LOW (ref 22–32)
Calcium: 8.3 mg/dL — ABNORMAL LOW (ref 8.9–10.3)
Chloride: 100 mmol/L (ref 98–111)
Creatinine, Ser: 1.09 mg/dL — ABNORMAL HIGH (ref 0.44–1.00)
GFR, Estimated: 56 mL/min — ABNORMAL LOW (ref >60)
Glucose, Bld: 166 mg/dL — ABNORMAL HIGH (ref 70–99)
Potassium: 3.6 mmol/L (ref 3.5–5.1)
Sodium: 130 mmol/L — ABNORMAL LOW (ref 135–145)

## 2023-04-18 LAB — SARS CORONAVIRUS 2 BY RT PCR: SARS Coronavirus 2 by RT PCR: NEGATIVE

## 2023-04-18 LAB — LACTIC ACID, PLASMA
Lactic Acid, Venous: 2.1 mmol/L (ref 0.5–1.9)
Lactic Acid, Venous: 1.6 mmol/L (ref 0.5–1.9)

## 2023-04-18 LAB — CBC
HCT: 40 % (ref 36.0–46.0)
Hemoglobin: 13.4 g/dL (ref 12.0–15.0)
MCH: 27.7 pg (ref 26.0–34.0)
MCHC: 33.5 g/dL (ref 30.0–36.0)
MCV: 82.6 fL (ref 80.0–100.0)
Platelets: 230 10*3/uL (ref 150–400)
RBC: 4.84 MIL/uL (ref 3.87–5.11)
RDW: 17.3 % — ABNORMAL HIGH (ref 11.5–15.5)
WBC: 16.2 10*3/uL — ABNORMAL HIGH (ref 4.0–10.5)
nRBC: 0 % (ref 0.0–0.2)

## 2023-04-18 LAB — TROPONIN I (HIGH SENSITIVITY)
Troponin I (High Sensitivity): 2 ng/L (ref <18)
Troponin I (High Sensitivity): 2 ng/L (ref <18)

## 2023-04-18 MED ORDER — ALBUTEROL SULFATE HFA 108 (90 BASE) MCG/ACT IN AERS: 2.0000 | INHALATION_SPRAY | RESPIRATORY_TRACT | Status: DC | PRN

## 2023-04-18 MED ORDER — SODIUM CHLORIDE 0.9 % IV BOLUS
500.0000 mL | Freq: Once | INTRAVENOUS | Status: AC
  Administered 2023-04-18: 500 mL via INTRAVENOUS

## 2023-04-18 MED ORDER — IOHEXOL 350 MG/ML SOLN
75.0000 mL | Freq: Once | INTRAVENOUS | Status: AC | PRN
  Administered 2023-04-18: 75 mL via INTRAVENOUS

## 2023-04-18 NOTE — ED Notes (Signed)
Pt transported to CT via stretcher.  

## 2023-04-18 NOTE — ED Provider Notes (Signed)
[place] [facility]MENT AT [facility] Provider Note   CSN: [id number] Arrival date & time: 04/18/23  1417     History {Add pertinent medical, surgical, social history, OB history to HPI:1} Chief Complaint  Patient presents with   Low Oxygen    Dorothy [name] is a 67 y.o. female.  She has a history of COPD ovarian cancer and was recently discharged from the hospital after being admitted for pneumonia.  She was discharged with oxygen 2 L nasal cannula.  She said she has been doing well at home although continues to have a cough minimally productive, no fever.  She went to a follow-up appointment with PCP today and found to have low oxygen sats.  She said she has been compliant with her oxygen and used her oxygen during transport at the office.  At the office she was given 2 DuoNebs.  She feels at her baseline as far as her breathing.  She has not smoked since her last admission.  She said she has been eating and drinking well.  The history is provided by the patient and the spouse.  Cough Cough characteristics:  Non-productive Sputum characteristics:  Nondescript Severity:  Moderate Onset quality:  Gradual Duration:  2 weeks Timing:  Intermittent Progression:  Unchanged Relieved by:  None tried Worsened by:  Activity Ineffective treatments:  None tried Associated symptoms: shortness of breath   Associated symptoms: no chest pain, no fever, no rhinorrhea and no sore throat        Home Medications Prior to Admission medications   Medication Sig Start Date End Date Taking? Authorizing Provider  acetaZOLAMIDE ER (DIAMOX) 500 MG capsule Take 1 capsule by mouth 2 (two) times daily. 03/20/23   [provider]  albuterol (VENTOLIN HFA) 108 (90 Base) MCG/ACT inhaler Inhale 2 puffs into the lungs See admin instructions. Inhale 2 puffs every 4 to 6 hours as needed for shortness of breath 03/19/23 04/18/23  Kendell Bane, MD  alendronate (FOSAMAX) 70 MG  tablet Take 70 mg by mouth once a week.    [provider]  ARIPiprazole (ABILIFY) 10 MG tablet Take 5 mg by mouth daily. Takes 1/2 daily 02/16/17   [provider]  aspirin EC 81 MG tablet Take 81 mg by mouth daily. Swallow whole.    [provider]  atorvastatin (LIPITOR) 40 MG tablet Take 40 mg by mouth daily. 06/19/22   [provider]  azelastine (ASTELIN) 0.1 % nasal spray Place 2 sprays into both nostrils 2 (two) times daily. 02/07/23   [provider]  brimonidine (ALPHAGAN) 0.2 % ophthalmic solution Place 1 drop into the right eye 3 (three) times daily.    [provider]  CALCIUM PO Take 3 tablets by mouth daily.    [provider]  Cholecalciferol 50 MCG (2000 UT) TABS Take 1 tablet by mouth daily at 6 (six) AM.    [provider]  clonazePAM (KLONOPIN) 0.5 MG tablet Take 1 mg by mouth 2 (two) times daily.    [provider]  dorzolamide-timolol (COSOPT) 22.3-6.8 MG/ML ophthalmic solution Place 1 drop into the right eye 2 (two) times daily.    [provider]  ipratropium-albuterol (DUONEB) 0.5-2.5 (3) MG/3ML SOLN Take 3 mLs by nebulization every 6 (six) hours as needed. 04/03/23   [provider]  labetalol (NORMODYNE) 100 MG tablet Take 1 tablet (100 mg total) by mouth 2 (two) times daily. 04/12/23   Vassie Loll, MD  latanoprost (  XALATAN) 0.005 % ophthalmic solution Place 1 drop into the right eye at bedtime. 03/20/23 03/19/24  [provider]  levocetirizine (XYZAL) 5 MG tablet Take 5 mg by mouth in the morning.    [provider]  levothyroxine (SYNTHROID) 125 MCG tablet Take 1 tablet (125 mcg total) by mouth daily at 6 (six) AM. 03/20/23 05/19/23  Shahmehdi, Gemma Payor, MD  losartan (COZAAR) 25 MG tablet Take 1 tablet (25 mg total) by mouth daily. 04/12/23   Vassie Loll, MD  Melatonin (MELATONIN EXTRA STRENGTH) 10 MG TABS Take 1 tablet by mouth at bedtime.    [provider]  montelukast (SINGULAIR) 10 MG tablet Take 10 mg by mouth at bedtime. 01/03/23   [provider]  Multiple Vitamin (MULTI-VITAMIN) tablet Take 2 tablets by mouth daily.    [provider]  nicotine (NICODERM CQ - DOSED IN MG/24 HOURS) 21 mg/24hr patch Place 1 patch (21 mg total) onto the skin daily. 04/12/23   Vassie Loll, MD  pantoprazole (PROTONIX) 40 MG tablet Take 1 tablet (40 mg total) by mouth 2 (two) times daily. 04/12/23   Vassie Loll, MD  predniSONE (DELTASONE) 20 MG tablet Take 3 tablets by mouth daily x 1 day; then 2 tablet by mouth daily x 2 days; then 1 tablet by mouth daily x 3 days; then half tablet by mouth daily x 3 days and stop prednisone. 04/12/23   Vassie Loll, MD  TRELEGY ELLIPTA 200-62.5-25 MCG/ACT AEPB Take 1 puff by mouth daily. 04/12/23   Vassie Loll, MD  zolpidem (AMBIEN) 10 MG tablet Take 10 mg by mouth at bedtime as needed for sleep. 02/04/19   [provider]      Allergies    Sulfa antibiotics    Review of Systems   Review of Systems  Constitutional:  Negative for fever.  HENT:  Negative for rhinorrhea and sore throat.   Respiratory:  Positive for cough and shortness of breath.   Cardiovascular:  Negative for chest pain.  Gastrointestinal:  Negative for abdominal pain.  Genitourinary:  Negative for dysuria.    Physical Exam Updated Vital Signs BP 99/66 (BP Location: Left Arm)   Pulse 66   Temp (!) 97.5 F (36.4 C) (Oral)   Resp 20   Ht 5\' 7"  (1.702 m)   Wt 55.3 kg   LMP 10/23/1980 Comment: Hyst age 57(ovaries retained)  SpO2 95%   BMI 19.11 kg/m  Physical Exam Vitals and nursing note reviewed.  Constitutional:      General: She is not in acute distress.    Appearance: Normal appearance. She is well-developed.  HENT:     Head: Normocephalic and atraumatic.  Eyes:     Conjunctiva/sclera: Conjunctivae normal.  Cardiovascular:     Rate and Rhythm: Normal rate and regular rhythm.     Heart sounds:  No murmur heard. Pulmonary:     Effort: Accessory muscle usage present. No respiratory distress.     Breath sounds: Examination of the left-middle field reveals rhonchi. Examination of the left-lower field reveals rhonchi. Rhonchi present.  Abdominal:     Palpations: Abdomen is soft.     Tenderness: There is no abdominal tenderness. There is no guarding or rebound.  Musculoskeletal:        General: Normal range of motion.     Cervical back: Neck supple.     Right lower leg: No edema.     Left lower leg: No edema.  Skin:    General: Skin  is warm and dry.     Capillary Refill: Capillary refill takes less than 2 seconds.  Neurological:     General: No focal deficit present.     Mental Status: She is alert.     Sensory: No sensory deficit.     Motor: No weakness.     ED Results / Procedures / Treatments   Labs (all labs ordered are listed, but only abnormal results are displayed) Labs Reviewed  SARS CORONAVIRUS 2 BY RT PCR  BASIC METABOLIC PANEL  CBC  TROPONIN I (HIGH SENSITIVITY)    EKG EKG Interpretation  Date/Time:  Wednesday April 18 2023 14:36:04 EDT Ventricular Rate:  68 PR Interval:  160 QRS Duration: 86 QT Interval:  376 QTC Calculation: 399 R Axis:   65 Text Interpretation: Normal sinus rhythm Cannot rule out Anterior infarct , age undetermined Abnormal ECG When compared with ECG of 10-Apr-2023 03:39, No significant change since last tracing Confirmed by Meridee Score [phone]) on 04/18/2023 3:43:53 PM  Radiology DG Chest 2 View  Result Date: 04/18/2023 CLINICAL DATA:  COPD EXAM: CHEST - 2 VIEW COMPARISON:  None Available. FINDINGS: No pleural effusion. No pneumothorax. No focal airspace opacity. No radiographically apparent displaced rib fractures. Visualized upper abdomen is unremarkable. Vertebral body heights are maintained. IMPRESSION: No focal airspace opacity. Electronically Signed   By: Lorenza Cambridge M.D.   On: 04/18/2023 15:03    Procedures Procedures   {Document cardiac monitor, telemetry assessment procedure when appropriate:1}  Medications Ordered in ED Medications  albuterol (VENTOLIN HFA) 108 (90 Base) MCG/ACT inhaler 2 puff (has no administration in time range)    ED Course/ Medical Decision Making/ A&P   {   Click here for ABCD2, HEART and other calculatorsREFRESH Note before signing :1}                          Medical Decision Making Amount and/or Complexity of Data Reviewed Labs: ordered. Radiology: ordered.  Risk Prescription drug management.   This patient complains of ***; this involves an extensive number of treatment Options and is a complaint that carries with it a high risk of complications and morbidity. The differential includes ***  I ordered, reviewed and interpreted labs, which included *** I ordered medication *** and reviewed PMP when indicated. I ordered imaging studies which included *** and I independently    visualized and interpreted imaging which showed *** Additional history obtained from *** Previous records obtained and reviewed *** I consulted *** and discussed lab and imaging findings and discussed disposition.  Cardiac monitoring reviewed, *** Social determinants considered, *** Critical Interventions: ***  After the interventions stated above, I reevaluated the patient and found *** Admission and further testing considered, ***   {Document critical care time when appropriate:1} {Document review of labs and clinical decision tools ie heart score, Chads2Vasc2 etc:1}  {Document your independent review of radiology images, and any outside records:1} {Document your discussion with family members, caretakers, and with consultants:1} {Document social determinants of health affecting pt's care:1} {Document your decision making why or why not admission, treatments were needed:1} Final Clinical Impression(s) / ED Diagnoses Final diagnoses:  None    Rx / DC Orders ED Discharge Orders      None

## 2023-04-18 NOTE — ED Provider Triage Note (Signed)
Emergency Medicine Provider Triage Evaluation Note  Dorothy [name] , a 67 y.o. female history of COPD on 2 L O2 by nasal cannula at baseline was evaluated in triage.  Pt was sent by PCPs office.  Patient hypoxic in the office at 84% was placed on 4 L O2 and  she received 2 DuoNebs in the office.  Sent here for further evaluation.  Complains of cough and some wheezing.  Oxygen here reduced to 3 L and patient 95%.  Currently, patient states she feels fine.  She denies chest pain, fever, worsening shortness of breath.  No hemoptysis  Review of Systems  Positive: Wheezing, cough, low oxygen Negative: Fever, chills, peripheral edema chest pain  Physical Exam  BP 99/66 (BP Location: Left Arm)   Pulse 69   Resp 20   Ht 5\' 7"  (1.702 m)   Wt 55.3 kg   LMP 10/23/1980 Comment: Hyst age 44(ovaries retained)  BMI 19.11 kg/m  Gen:   Awake, no distress   Resp:  Normal effort, scattered rhonchi with occasional expiratory wheezes MSK:   Moves extremities without difficulty  Other:  No peripheral edema  Medical Decision Making  Medically screening exam initiated at 2:55 PM.  Appropriate orders placed.  Dorothy [name] was informed that the remainder of the evaluation will be completed by another provider, this initial triage assessment does not replace that evaluation, and the importance of remaining in the ED until their evaluation is complete.     Pauline Aus, PA-C 04/18/23 2725

## 2023-04-18 NOTE — Discharge Instructions (Signed)
You were seen in the emergency department for low oxygen level.  You had blood work EKG chest x-ray and a CAT scan of your chest that did not show an obvious reason for your low oxygen level.  You were offered admission to the hospital for further management and you declined admission.  Please continue your regular medications and oxygen at 3 L nasal cannula.  Follow-up with pulmonary as scheduled on Monday.  Continue to not smoke.  Return to the emergency department if any worsening or concerning symptoms.

## 2023-04-18 NOTE — ED Notes (Signed)
Pt rolled to be placed on bedpan, desat to 72%, HR up to 120s on 3L [place]. When placed back onto back, recovered back to baseline within several minutes. Purewick placed on patient. MD notified.

## 2023-04-18 NOTE — ED Triage Notes (Signed)
Pt sent by PCP for low oxygen saturation. Per PCP pt was seen at the office for a f/u from previous admission to the hospital. Pt was admitted for COPD exacerbation and reading 84% on 4L. Pt was give 2 duo nebs at office and refused to be transported by EMS. In triage pt oxygen 95% on 3L. Pt denies pain.

## 2023-04-22 NOTE — Progress Notes (Unsigned)
Dorothy [name], female    DOB: 1955/11/26    MRN: [id number]   Brief patient profile:  22  yowf  quit smoking 04/09/23  referred to pulmonary clinic in [place]  04/23/2023 by Mardella Layman NP  for new 02 dep resp failure s/p admit:    Admit date:     04/10/2023  Discharge date: 04/12/23      Recommendations at discharge:  Repeat basic metabolic panel to follow electrolytes and renal function Make sure patient has follow-up with pulmonologist as instructed Reassess blood pressure and adjust antihypertensive treatment as needed Continue assisting patient with a smoking cessation   Discharge Diagnoses:   COPD exacerbation (HCC)   Primary hypertension   Gastroesophageal reflux disease   Elevated troponin   Bronchiectasis with acute exacerbation [facility])   Brief Hospital admission narrative course: 67 y.o. female with medical history significant for COPD, tobacco abuse, anxiety/depression, hypertension, hypothyroidism, GERD, and dyslipidemia who was recently discharged on 5/27 after treatment for hypertensive crisis.  She presented to the ED with worsening shortness of breath over the last 2 weeks that worsened last night and prompted a call to EMS.  She was recently started on home oxygen per her primary care physician.  She denies a significant cough, fevers, chills, chest pain, or lower extremity edema.  She had significant wheezing requiring nonrebreather mask and nebulizer treatments on arrival.  She continues to smoke 1 pack/day.   ED Course: Vital signs with temperature 100.3 and otherwise stable heart rate and blood pressure.  Leukocytosis of 12,200 and sodium 128.  Troponin 81 and follow-up 168.  EKG with no significant findings aside from sinus tachycardia at 110 bpm.   Assessment and Plan: 1-COPD exacerbation acute/chronic respiratory failure -2 L nasal cannula supplementation in place -Patient denies chest pain and was able to speak in full sentences at time of discharge. -Continue  nebulizer as needed management, steroids tapering, Protonix twice a day, flutter valve and mucolytic's. -Outpatient follow-up with pulmonologist for PFTs and adjustment to maintenance therapy recommended; for now continue the use of trilogy. -Lisinopril has been substituted to losartan; minimizing bronchospasm component. -Continue oral antibiotics at time of discharge for treatment of bronchiectasis. -Continue home oxygen supplementation (2 L at baseline).   2-troponin elevation -No chest pain reported -Most likely demand ischemia -Telemetry and EKG with no acute abnormalities -2D echo reassuring with preserved ejection fraction and no wall motion abnormalities. -Patient received over 36 hours of heparin drip empirically -Continue treatment for COPD exacerbation and bronchiectasis   3-tobacco abuse -Cessation counseling provided -Continue nicotine patch. -Patient looking to stop smoking.   4-hypertension -Continue home antihypertensive agents -Stable blood pressure appreciated. -Lisinopril substituted by losartan.   5-GERD -Continue PPI.   6-dyslipidemia -Continue statin. -Heart healthy diet discussed with patient.   7-anxiety/depression -Continue Abilify and Klonopin. -Stable mood. -No suicidal ideation or hallucinations   8-hypothyroidism -Continue Synthroid -Continue to follow intermittent thyroid panel as an outpatient to further adjust Synthroid as needed.   9-history of chronic hyponatremia -Continue to follow electrolytes -Patient advised to maintain adequate hydration. -recently discontinuation of effexor and hydrochlorothiazide.   Consultants: None Procedures performed: See below for x-ray reports Disposition: Home Diet recommendation: Heart healthy diet.      STOP taking these medications     Advair Diskus 250-50 MCG/ACT Aepb Generic drug: fluticasone-salmeterol    ELDERBERRY PO    lisinopril 20 MG tablet Commonly known as: ZESTRIL     methylPREDNISolone 4 MG Tbpk tablet Commonly known as: MEDROL  DOSEPAK           TAKE these medications     acetaZOLAMIDE ER 500 MG capsule Commonly known as: DIAMOX Take 1 capsule by mouth 2 (two) times daily.    albuterol 108 (90 Base) MCG/ACT inhaler Commonly known as: VENTOLIN HFA Inhale 2 puffs into the lungs See admin instructions. Inhale 2 puffs every 4 to 6 hours as needed for shortness of breath    alendronate 70 MG tablet Commonly known as: FOSAMAX Take 70 mg by mouth once a week.    ARIPiprazole 10 MG tablet Commonly known as: ABILIFY Take 5 mg by mouth daily. Takes 1/2 daily    aspirin EC 81 MG tablet Take 81 mg by mouth daily. Swallow whole.    atorvastatin 40 MG tablet Commonly known as: LIPITOR Take 40 mg by mouth daily.    azelastine 0.1 % nasal spray Commonly known as: ASTELIN Place 2 sprays into both nostrils 2 (two) times daily.    brimonidine 0.2 % ophthalmic solution Commonly known as: ALPHAGAN Place 1 drop into the right eye 3 (three) times daily.    CALCIUM PO Take 3 tablets by mouth daily.    cefdinir 300 MG capsule Commonly known as: OMNICEF Take 1 capsule (300 mg total) by mouth 2 (two) times daily for 5 days.    Cholecalciferol 50 MCG (2000 UT) Tabs Take 1 tablet by mouth daily at 6 (six) AM.    clonazePAM 0.5 MG tablet Commonly known as: KLONOPIN Take 1 mg by mouth 2 (two) times daily.    dorzolamide-timolol 2-0.5 % ophthalmic solution Commonly known as: COSOPT Place 1 drop into the right eye 2 (two) times daily.    ipratropium-albuterol 0.5-2.5 (3) MG/3ML Soln Commonly known as: DUONEB Take 3 mLs by nebulization every 6 (six) hours as needed.    labetalol 100 MG tablet Commonly known as: NORMODYNE Take 1 tablet (100 mg total) by mouth 2 (two) times daily.    latanoprost 0.005 % ophthalmic solution Commonly known as: XALATAN Place 1 drop into the right eye at bedtime.    levocetirizine 5 MG tablet Commonly known as:  XYZAL Take 5 mg by mouth in the morning.    levothyroxine 125 MCG tablet Commonly known as: SYNTHROID Take 1 tablet (125 mcg total) by mouth daily at 6 (six) AM.    losartan 25 MG tablet Commonly known as: COZAAR Take 1 tablet (25 mg total) by mouth daily.    Melatonin Extra Strength 10 MG Tabs Generic drug: Melatonin Take 1 tablet by mouth at bedtime.    montelukast 10 MG tablet Commonly known as: SINGULAIR Take 10 mg by mouth at bedtime.    Multi-Vitamin tablet Take 2 tablets by mouth daily.    nicotine 21 mg/24hr patch Commonly known as: NICODERM CQ - dosed in mg/24 hours Place 1 patch (21 mg total) onto the skin daily.    pantoprazole 40 MG tablet Commonly known as: PROTONIX Take 1 tablet (40 mg total) by mouth 2 (two) times daily.    predniSONE 20 MG tablet Commonly known as: DELTASONE Take 3 tablets by mouth daily x 1 day; then 2 tablet by mouth daily x 2 days; then 1 tablet by mouth daily x 3 days; then half tablet by mouth daily x 3 days and stop prednisone.    Trelegy Ellipta 200-62.5-25 MCG/ACT Aepb Generic drug: Fluticasone-Umeclidin-Vilant Take 1 puff by mouth daily.    zolpidem 10 MG tablet Commonly known as: AMBIEN Take 10 mg by  mouth at bedtime as needed for sleep.           History of Present Illness  04/23/2023  Pulmonary/ 1st office eval/ Tamari Redwine / [facility] maint on trelegy 200 and nomodyne 100 mg bid   Dyspnea:  limited by poor eyesight, not breathing  Cough: rattle esp in am > slt yellow Sleep: 30 degrees electric SABA use: once a day "it's a habit" 02: 3lpm 24/7   No obvious day to day or daytime pattern/variability or assoc mucus plugs or hemoptysis or cp or chest tightness, subjective wheeze or overt sinus or hb symptoms.    Also denies any obvious fluctuation of symptoms with weather or environmental changes or other aggravating or alleviating factors except as outlined above   No unusual exposure hx or h/o childhood pna/ asthma or  knowledge of premature birth.  Current Allergies, Complete Past Medical History, Past Surgical History, Family History, and Social History were reviewed in [employer] record.  ROS  The following are not active complaints unless bolded Hoarseness, sore throat, dysphagia, dental problems, itching, sneezing,  nasal congestion or discharge of excess mucus or purulent secretions, ear ache,   fever, chills, sweats, unintended wt loss or wt gain, classically pleuritic or exertional cp,  orthopnea pnd or arm/hand swelling  or leg swelling, presyncope, palpitations, abdominal pain, anorexia, nausea, vomiting, diarrhea  or change in bowel habits or change in bladder habits, change in stools or change in urine, dysuria, hematuria,  rash, arthralgias, visual complaints, headache, numbness, weakness or ataxia or problems with walking or coordination,  change in mood or  memory.              Outpatient Medications Prior to Visit- - NOTE:   Unable to verify as accurately reflecting what pt takes    Medication Sig Dispense Refill   acetaZOLAMIDE ER (DIAMOX) 500 MG capsule Take 1 capsule by mouth 2 (two) times daily.     albuterol (VENTOLIN HFA) 108 (90 Base) MCG/ACT inhaler Inhale 2 puffs into the lungs See admin instructions. Inhale 2 puffs every 4 to 6 hours as needed for shortness of breath 8 g 3   alendronate (FOSAMAX) 70 MG tablet Take 70 mg by mouth once a week.     ARIPiprazole (ABILIFY) 10 MG tablet Take 5 mg by mouth daily. Takes 1/2 daily     aspirin EC 81 MG tablet Take 81 mg by mouth daily. Swallow whole.     atorvastatin (LIPITOR) 40 MG tablet Take 40 mg by mouth daily.     azelastine (ASTELIN) 0.1 % nasal spray Place 2 sprays into both nostrils 2 (two) times daily.     brimonidine (ALPHAGAN) 0.2 % ophthalmic solution Place 1 drop into the right eye 3 (three) times daily.     CALCIUM PO Take 3 tablets by mouth daily.     Cholecalciferol 50 MCG (2000 UT) TABS Take 1 tablet  by mouth daily at 6 (six) AM.     clonazePAM (KLONOPIN) 0.5 MG tablet Take 1 mg by mouth 2 (two) times daily.     dorzolamide-timolol (COSOPT) 22.3-6.8 MG/ML ophthalmic solution Place 1 drop into the right eye 2 (two) times daily.     ipratropium-albuterol (DUONEB) 0.5-2.5 (3) MG/3ML SOLN Take 3 mLs by nebulization every 6 (six) hours as needed.     labetalol (NORMODYNE) 100 MG tablet Take 1 tablet (100 mg total) by mouth 2 (two) times daily. 60 tablet 1   latanoprost (XALATAN) 0.005 %  ophthalmic solution Place 1 drop into the right eye at bedtime.     levocetirizine (XYZAL) 5 MG tablet Take 5 mg by mouth in the morning.     levothyroxine (SYNTHROID) 125 MCG tablet Take 1 tablet (125 mcg total) by mouth daily at 6 (six) AM. 30 tablet 1   losartan (COZAAR) 25 MG tablet Take 1 tablet (25 mg total) by mouth daily. 30 tablet 2   Melatonin (MELATONIN EXTRA STRENGTH) 10 MG TABS Take 1 tablet by mouth at bedtime.     montelukast (SINGULAIR) 10 MG tablet Take 10 mg by mouth at bedtime.     Multiple Vitamin (MULTI-VITAMIN) tablet Take 2 tablets by mouth daily.     nicotine (NICODERM CQ - DOSED IN MG/24 HOURS) 21 mg/24hr patch Place 1 patch (21 mg total) onto the skin daily. 28 patch 2   pantoprazole (PROTONIX) 40 MG tablet Take 1 tablet (40 mg total) by mouth 2 (two) times daily. 60 tablet 1   predniSONE (DELTASONE) 20 MG tablet  Finished   0   TRELEGY ELLIPTA 200-62.5-25 MCG/ACT AEPB Take 1 puff by mouth daily. 60 each 1   zolpidem (AMBIEN) 10 MG tablet Take 10 mg by mouth at bedtime as needed for sleep.     No facility-administered medications prior to visit.    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   Benign essential HTN 01/17/2012   Cancer (HCC) 1982   ovarian   Cancer of cervix (HCC)    Constipation    Depression    Takes Klonopin   Depression with anxiety 01/17/2012   GERD  (gastroesophageal reflux disease)    Hypercholesteremia    Hypertension    takes meds   Hypothyroidism    on Levonthyroxine   Hypothyroidism (acquired) 01/17/2012   Since age 59   Low sodium levels 09/25/2022   Na level 125 at [facility]   Osteopenia 2018   forearm   Osteoporosis 07/25/2021   forearm   Ovarian cyst 01/17/2012   1995  BSO   Personal history of colonic polyps 11/07/2007   Pneumonia    x 2- last time > 5 years ago   Pneumonia 01/17/2012   10 yrs ago and 5 years ago    Rash and other nonspecific skin eruption 01/17/2012   Dorsum hands & extensor surfaces arms; intermittent      Objective:     BP 126/74   Pulse 63   Ht 5\' 6"  (1.676 m)   Wt 120 lb 3.2 oz (54.5 kg)   LMP 10/23/1980 Comment: Hyst age 21(ovaries retained)  SpO2 97% Comment: 3L POC  BMI 19.40 kg/m   SpO2: 97 % (3L POC)  Amb wf congested sounding cough   HEENT : Oropharynx  clear      NECK :  without  apparent JVD/ palpable Nodes/TM   LUNGS: no acc muscle use,  Min barrel  contour chest wall with bilateral  slightly decreased bs s audible wheeze and  without cough on insp or exp maneuvers and min  Hyperresonant  to  percussion bilaterally    CV:  RRR  no s3 or murmur or increase in P2, and no edema   ABD:  soft and nontender with pos end  insp Hoover's  in the supine position.  No bruits or organomegaly appreciated   MS:  Nl gait/ ext warm without deformities Or obvious joint restrictions  calf tenderness, cyanosis or clubbing     SKIN: warm and dry without lesions    NEURO:  alert, approp, nl sensorium with  no motor or cerebellar deficits apparent.            Assessment   COPD GOLD ? / group E Quit smoking 04/09/23 p admit with aecopd/ acute on chronic resp failure - 04/23/2023  After extensive coaching inhaler device,  effectiveness =    75% with DPI/ elipta (limited by eyesight to having her sister open it and administer)   DDX of  difficult airways  management almost all start with A and  include Adherence, Ace Inhibitors, Acid Reflux, Active Sinus Disease, Alpha 1 Antitripsin deficiency, Anxiety masquerading as Airways dz,  ABPA,  Allergy(esp in young), Aspiration (esp in elderly), Adverse effects of meds,  Active smoking or vaping, A bunch of PE's (a small clot burden can't cause this syndrome unless there is already severe underlying pulm or vascular dz with poor reserve) plus two Bs  = Bronchiectasis and Beta blocker use..and one C= CHF  Adherence is always the initial "prime suspect" and is a multilayered concern that requires a "trust but verify" approach in every patient - starting with knowing how to use medications, especially inhalers, correctly, keeping up with refills and understanding the fundamental difference between maintenance and prns vs those medications only taken for a very short course and then stopped and not refilled.  - eyesight will be a challenge, see device teaching - return with all meds in hand using a trust but verify approach to confirm accurate Medication  Reconciliation The principal here is that until we are certain that the  patients are doing what we've asked, it makes no sense to ask them to do more.   ? Acid (or non-acid) GERD > always difficult to exclude as up to 75% of pts in some series report no assoc GI/ Heartburn symptoms> rec max (24h)  acid suppression and diet restrictions/ reviewed and instructions given in writing and strongly rec off fosamax based on last ct chest c/w esophagitis   Active smoking > denies since admit/ reinforced   Allergy/ asthma > ok now off pred taper and should do fine with Trelegy 100 once finishes the 200 using the copd/ lowest effective dose approach and check EOS next ov  ? Anxiety/depression  > usually at the bottom of this list of usual suspects but  note already on psychotropics and may interfere with adherence and also interpretation of response or lack thereof to  symptom management which can be quite subjective.   ? A Bunch of PEs > CTa neg 04/18/23 (last flare)   ? Chf > bnp 37 at last admit rules out  Alpha one AT > will need phenotype next ov   Chronic respiratory failure with hypoxia (HCC) Placed on 02 by PCP prior to admit 04/10/23   Rec 3lpm sleeping but daytime titrate to sats > 90%   Advised: Make sure you check your oxygen saturation  AT  your highest level of activity (not after you stop)   to be sure it stays over 90% and adjust  02 flow upward to maintain this level if needed but remember to turn it back to previous settings when you stop (to conserve your supply).          Each maintenance medication was reviewed in detail including emphasizing most importantly the difference between  maintenance and prns and under what circumstances the prns are to be triggered using an action plan format where appropriate.  Total time for H and P, chart review, counseling, reviewing 02 device(s) and generating customized AVS unique to this office visit / same day charting > 40 min post hosp eval with pt new to me          Sandrea Hughs, MD 04/23/2023

## 2023-04-23 ENCOUNTER — Ambulatory Visit: Payer: Medicare HMO | Admitting: Internal Medicine

## 2023-04-23 ENCOUNTER — Encounter: Payer: Self-pay | Admitting: Internal Medicine

## 2023-04-23 VITALS — BP 126/74 | HR 63 | Ht 66.0 in | Wt 120.2 lb

## 2023-04-23 DIAGNOSIS — J449 Chronic obstructive pulmonary disease, unspecified: Secondary | ICD-10-CM

## 2023-04-23 DIAGNOSIS — J9611 Chronic respiratory failure with hypoxia: Secondary | ICD-10-CM | POA: Diagnosis not present

## 2023-04-23 NOTE — Assessment & Plan Note (Signed)
Quit smoking 04/09/23 p admit with aecopd/ acute on chronic resp failure - 04/23/2023  After extensive coaching inhaler device,  effectiveness =    75% with DPI/ elipta (limited by eyesight to having her sister open it and administer)   DDX of  difficult airways management almost all start with A and  include Adherence, Ace Inhibitors, Acid Reflux, Active Sinus Disease, Alpha 1 Antitripsin deficiency, Anxiety masquerading as Airways dz,  ABPA,  Allergy(esp in young), Aspiration (esp in elderly), Adverse effects of meds,  Active smoking or vaping, A bunch of PE's (a small clot burden can't cause this syndrome unless there is already severe underlying pulm or vascular dz with poor reserve) plus two Bs  = Bronchiectasis and Beta blocker use..and one C= CHF  Adherence is always the initial "prime suspect" and is a multilayered concern that requires a "trust but verify" approach in every patient - starting with knowing how to use medications, especially inhalers, correctly, keeping up with refills and understanding the fundamental difference between maintenance and prns vs those medications only taken for a very short course and then stopped and not refilled.  - eyesight will be a challenge, see device teaching - return with all meds in hand using a trust but verify approach to confirm accurate Medication  Reconciliation The principal here is that until we are certain that the  patients are doing what we've asked, it makes no sense to ask them to do more.   ? Acid (or non-acid) GERD > always difficult to exclude as up to 75% of pts in some series report no assoc GI/ Heartburn symptoms> rec max (24h)  acid suppression and diet restrictions/ reviewed and instructions given in writing and strongly rec off fosamax based on last ct chest c/w esophagitis   Active smoking > denies since admit/ reinforced   Allergy/ asthma > ok now off pred taper and should do fine with Trelegy 100 once finishes the 200 using the copd/  lowest effective dose approach and check EOS next ov  ? Anxiety/depression  > usually at the bottom of this list of usual suspects but  note already on psychotropics and may interfere with adherence and also interpretation of response or lack thereof to symptom management which can be quite subjective.   ? A Bunch of PEs > CTa neg 04/18/23 (last flare)   ? Chf > bnp 37 at last admit rules out  Alpha one AT > will need phenotype next ov

## 2023-04-23 NOTE — Patient Instructions (Addendum)
I would prefer you avoid Fosamax (alendronate) based on you CT of your esophagus  Alternatives are Reclast one infusion per year or prolia injection twice a  year   Continue protonix (pantoprazole) Take 30- 60 min before your first and last meals of the day   Remain off cigarettes   Make sure you check your oxygen saturation  AT  your highest level of activity (not after you stop)   to be sure it stays over 90% and adjust  02 flow upward to maintain this level if needed but remember to turn it back to previous settings when you stop (to conserve your supply).   Plan A = Automatic = Always=    Trelegy 100 one click each am  Brush tongue and teeth with arm and hammer after use   Plan B = Backup (to supplement plan A, not to replace it) Only use your albuterol inhaler as a rescue medication to be used if you can't catch your breath by resting or doing a relaxed purse lip breathing pattern.  - The less you use it, the better it will work when you need it. - Ok to use the inhaler up to 2 puffs  every 4 hours if you must but call for appointment if use goes up over your usual need - Don't leave home without it !!  (think of it like the spare tire for your car)   Plan C = Crisis (instead of Plan B but only if Plan B stops working) - only use your albuterol nebulizer if you first try Plan B and it fails to help > ok to use the nebulizer up to every 4 hours but if start needing it regularly call for immediate appointment   For cough/ congestion > mucinex or mucinex dm  up to maximum of  1200 mg every 12 hours  as needed   Please schedule a follow up office visit in 6 weeks, call sooner if needed with all medications /inhalers/ solutions in hand so we can verify exactly what you are taking. This includes all medications from all doctors and over the counters.

## 2023-04-23 NOTE — Assessment & Plan Note (Addendum)
Placed on 02 by PCP prior to admit 04/10/23   Rec 3lpm sleeping but daytime titrate to sats > 90%   Advised: Make sure you check your oxygen saturation  AT  your highest level of activity (not after you stop)   to be sure it stays over 90% and adjust  02 flow upward to maintain this level if needed but remember to turn it back to previous settings when you stop (to conserve your supply).          Each maintenance medication was reviewed in detail including emphasizing most importantly the difference between maintenance and prns and under what circumstances the prns are to be triggered using an action plan format where appropriate.  Total time for H and P, chart review, counseling, reviewing 02 device(s) and generating customized AVS unique to this office visit / same day charting > 40 min post hosp eval with pt new to me

## 2023-04-24 ENCOUNTER — Telehealth: Payer: Self-pay

## 2023-04-24 NOTE — Telephone Encounter (Signed)
Per ML: "Schedule with Dr Edward Jolly upon her return for management of Osteoporosis. Dr. Elbert Ewings"   Called pt back and Dorothy [name] (pt's sister) answered the phone and said she was the one who called. Dorothy [name] was not on pt's DPR so I inquired if Dorothy [name] was available since sister was not listed.   Dorothy [name] was present and verbally gave me consent to talk with her sister.   Advised them of provider's instructions and will send msg to appt desk to have them contact pt for sooner appt w/ BS than 09/2023.

## 2023-04-24 NOTE — Telephone Encounter (Signed)
BS pt. Unknown caller (did not provide name but referred to pt as pt was not the caller-gave pt's number to cb) called to advise [place] that pt seen pulmonologist yesterday and was advised to discontinue taking fosamax and consider injection instead due to recent CT results and something having to do with pt's esophagus.   Office visit notes w/ specialist and CT results are in EMR. Please advise.

## 2023-04-25 MED ORDER — TRELEGY ELLIPTA 100-62.5-25 MCG/ACT IN AEPB: 1.0000 | INHALATION_SPRAY | Freq: Every day | RESPIRATORY_TRACT | 0 refills | Status: DC

## 2023-04-25 NOTE — Addendum Note (Signed)
Addended by: Jaynee Eagles on: 04/25/2023 08:56 AM   Modules accepted: Orders

## 2023-04-25 NOTE — Telephone Encounter (Signed)
Pt is scheduled w/ BS on 06/13/2023. Will route to provider for final review and close.

## 2023-05-04 NOTE — Telephone Encounter (Signed)
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  

## 2023-05-04 NOTE — Telephone Encounter (Signed)
CT scan report reviewed by me.  Has signs of possible esophagitis/reflux disease.   I recommend she stop the Fosamax, see her PCP regarding these CT findings, and see me in August for [place] to discuss alterative treatments for her osteoporosis.

## 2023-05-07 ENCOUNTER — Telehealth: Payer: Self-pay | Admitting: Internal Medicine

## 2023-05-07 MED ORDER — TRELEGY ELLIPTA 100-62.5-25 MCG/ACT IN AEPB: 1.0000 | INHALATION_SPRAY | Freq: Every day | RESPIRATORY_TRACT | 1 refills | Status: DC

## 2023-05-07 NOTE — Telephone Encounter (Signed)
Spoke with patient and advised she can contact ins company and request covered alternative (if they have one) or if they would allow a request for a tier exception. Patient will contact ins company and call us back with options. Nothing further needed at this time.

## 2023-05-07 NOTE — Telephone Encounter (Signed)
Pt. Sisiter calling was given samples on visit and needs script for Fluticasone-Umeclidin-Vilant (TRELEGY ELLIPTA) 100-62.5-25 MCG/ACT AEPB   called into pharmacy

## 2023-05-07 NOTE — Telephone Encounter (Signed)
Fluticasone-Umeclidin-Vilant (TRELEGY ELLIPTA) 100-62.5-25 MCG/ACT AEPB   pt. Calling back the med came back to be $140 and needs something cheaper but asking if she can be on Advir please advise

## 2023-05-07 NOTE — Telephone Encounter (Signed)
 Rx sent to pharmacy   

## 2023-05-08 ENCOUNTER — Telehealth: Payer: Self-pay | Admitting: Internal Medicine

## 2023-05-08 NOTE — Telephone Encounter (Signed)
Leta Jungling sister states insurance company would not help. Would like to know if patient can go on generic Advair. Pharmacy is [facility] Leta Jungling phone number is [phone].

## 2023-05-08 NOTE — Telephone Encounter (Signed)
Per patient's sister Trelegy is too expensive and ins co cannot provide alternatives or tier exception. Patient/sister would like to know if can change to Advair?  Please advise, thank you!we

## 2023-05-09 NOTE — Telephone Encounter (Signed)
 Call placed to patient. LMTRC.  

## 2023-05-30 NOTE — Progress Notes (Signed)
GYNECOLOGY  VISIT   HPI: 67 y.o.   Widowed  Caucasian  female   [id number] with Patient's last menstrual period was 10/23/1980.   here for   discuss for osteoporosis.  BMD 08/22/2021 - T score -2.7 of forearm radius and T score of spine -1.8.  She saw endocrinology 12/06/22 and started Fosamax 70 mg weekly.  She has some problems with acid reflux, indigestion.  Has not taken Fosamax for some months.   She had a CT angiogram 04/18/23 due to hypoxia and cough.  It showed esophageal wall thickening.   She has emphysema.  Her pulmonologist has recommend she stop the Fosamax and consider Reclast or Prolia.  See progress note 06/11/23 from Dr. Sandrea Hughs.  Having vision issues.  Hx retinal vein occlusion right eye.  Has glaucoma also. Her sister and brother live with her and help her with her medications.   GYNECOLOGIC HISTORY: Patient's last menstrual period was 10/23/1980. Contraception:  hyst Menopausal hormone therapy:  n/a Last mammogram:  08/23/22 Breast Density Cat B,  BI-RADS CAT 1 neg Last pap smear:   09/21/22 neg: HR HPV neg        OB History     Gravida  2   Para  1   Term  1   Preterm      AB  1   Living  1      SAB  1   IAB      Ectopic      Multiple      Live Births                 Patient Active Problem List   Diagnosis Date Noted   Osteoporosis 06/11/2023   Chronic respiratory failure with hypoxia (HCC) 04/23/2023   Gastroesophageal reflux disease 04/12/2023   Elevated troponin 04/12/2023   Bronchiectasis with acute exacerbation (HCC) 04/12/2023   COPD with acute exacerbation (HCC) 04/10/2023   Hypertensive crisis 03/17/2023   Psychiatric disorder 03/17/2023   COPD GOLD ? 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 11/04/2020   Nuclear sclerotic cataract of both eyes 11/04/2020   Peripheral focal chorioretinal inflammation of both eyes 11/04/2020   Pulmonary nodule 08/18/2016   COPD with exacerbation (HCC) 08/18/2016   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   Acute respiratory failure with hypoxia (HCC) 09/25/2012   Hyponatremia 07/10/2012   Abdominal pain 07/10/2012   Hematochezia 07/10/2012   Avascular necrosis of bones of both hips (HCC) 01/17/2012   Hypothyroidism (acquired) 01/17/2012   Primary hypertension 01/17/2012   Depression with anxiety 01/17/2012   Cancer of cervix (HCC) 01/17/2012   Ovarian cyst 01/17/2012   Community acquired pneumonia 01/17/2012   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   Rash and other nonspecific skin eruption 01/17/2012   Personal history of colonic polyps 11/07/2007    Past Medical History:  Diagnosis Date   Anxiety    Arthritis    hands and Knees   Avascular necrosis of  bones of both hips (HCC) 01/17/2012   Benign essential HTN 01/17/2012   Cancer (HCC) 1982   ovarian   Cancer of cervix (HCC)    Constipation    Depression    Takes Klonopin   Depression with anxiety 01/17/2012   GERD (gastroesophageal reflux disease)    Hypercholesteremia    Hypertension    takes meds   Hypothyroidism    on Levonthyroxine   Hypothyroidism (acquired) 01/17/2012   Since age 31   Low sodium levels 09/25/2022   Na level 125 at [facility]   Osteopenia 2018   forearm   Osteoporosis 07/25/2021   forearm   Ovarian cyst 01/17/2012   1995  BSO   Personal history of colonic polyps 11/07/2007    Pneumonia    x 2- last time > 5 years ago   Pneumonia 01/17/2012   10 yrs ago and 5 years ago    Rash and other nonspecific skin eruption 01/17/2012   Dorsum hands & extensor surfaces arms; intermittent    Past Surgical History:  Procedure Laterality Date   ABDOMINAL HYSTERECTOMY  1982   ovaries retained--for CIS   CELIAC ARTERY BYPASS  09/25/2012   Procedure: CELIAC ARTERY BYPASS;  Surgeon: Sherren Kerns, MD;  Location: [facility] OR;  Service: Vascular;  Laterality: N/A;  Bypass using Hemashield Gold Vascular Graft 12mm x 6mm x 40cm   COLONOSCOPY  2009   [place]-polyps, 5 yr FU   JOINT REPLACEMENT     MESENTERIC ARTERY BYPASS  09/25/2012   Procedure: MESENTERIC ARTERY BYPASS;  Surgeon: Sherren Kerns, MD;  Location: [facility] OR;  Service: Vascular;  Laterality: N/A;  Bypass using Hemashield Gold Vascular Graft 12mm x 6mm x 40cm   OVARIAN CYST REMOVAL  1997   -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   TOTAL HIP ARTHROPLASTY  09/06/2011   Procedure: TOTAL HIP ARTHROPLASTY;  Surgeon: Loreta Ave, MD;  Location: [facility] OR;  Service: Orthopedics;  Laterality: Left;  TOTAL HIP ARTHROPLASTY LEFT SIDE   TOTAL HIP ARTHROPLASTY  10/11/2011   Procedure: TOTAL HIP ARTHROPLASTY;  Surgeon: Loreta Ave, MD;  Location: [facility] OR;  Service: Orthopedics;  Laterality: Right;  120 MINUTES FOR THIS SURGERY   WOUND EXPLORATION  10/02/2012   Procedure: WOUND EXPLORATION;  Surgeon: Sherren Kerns, MD;  Location: [facility] OR;  Service: Vascular;  Laterality: N/A;  abdominal wound exploration    Current Outpatient Medications  Medication Sig Dispense Refill   acetaZOLAMIDE ER (DIAMOX) 500 MG capsule Take 1 capsule by mouth 2 (two) times daily.     alendronate (FOSAMAX) 70 MG tablet Take 70 mg by mouth once a week.     ARIPiprazole (ABILIFY) 10 MG tablet Take 5 mg by mouth daily. Takes 1/2 daily     aspirin EC 81 MG tablet Take 81 mg by mouth daily. Swallow whole.     atorvastatin (LIPITOR) 40 MG tablet Take  40 mg by mouth daily.     azelastine (ASTELIN) 0.1 % nasal spray Place 2 sprays into both nostrils 2 (two) times daily.     brimonidine (ALPHAGAN) 0.2 % ophthalmic solution Place 1 drop into the right eye 3 (three) times daily.     CALCIUM PO Take 3 tablets by mouth daily.     Cholecalciferol 50 MCG (2000 UT) TABS Take 1 tablet by mouth daily at 6 (six) AM.     clonazePAM (KLONOPIN) 0.5 MG tablet Take 1 mg by mouth 2 (two)  times daily.     dorzolamide-timolol (COSOPT) 22.3-6.8 MG/ML ophthalmic solution Place 1 drop into the right eye 2 (two) times daily.     ipratropium-albuterol (DUONEB) 0.5-2.5 (3) MG/3ML SOLN Take 3 mLs by nebulization every 6 (six) hours as needed.     labetalol (NORMODYNE) 100 MG tablet Take 1 tablet (100 mg total) by mouth 2 (two) times daily. 60 tablet 1   latanoprost (XALATAN) 0.005 % ophthalmic solution Place 1 drop into the right eye at bedtime.     levocetirizine (XYZAL) 5 MG tablet Take 5 mg by mouth in the morning.     losartan (COZAAR) 25 MG tablet Take 1 tablet (25 mg total) by mouth daily. 30 tablet 2   Melatonin (MELATONIN EXTRA STRENGTH) 10 MG TABS Take 1 tablet by mouth at bedtime.     montelukast (SINGULAIR) 10 MG tablet Take 10 mg by mouth at bedtime.     Multiple Vitamin (MULTI-VITAMIN) tablet Take 2 tablets by mouth daily.     nicotine (NICODERM CQ - DOSED IN MG/24 HOURS) 21 mg/24hr patch Place 1 patch (21 mg total) onto the skin daily. 28 patch 2   pantoprazole (PROTONIX) 40 MG tablet Take 1 tablet (40 mg total) by mouth 2 (two) times daily. 60 tablet 1   zolpidem (AMBIEN) 10 MG tablet Take 10 mg by mouth at bedtime as needed for sleep.     albuterol (VENTOLIN HFA) 108 (90 Base) MCG/ACT inhaler Inhale 2 puffs into the lungs See admin instructions. Inhale 2 puffs every 4 to 6 hours as needed for shortness of breath 8 g 3   levothyroxine (SYNTHROID) 125 MCG tablet Take 1 tablet (125 mcg total) by mouth daily at 6 (six) AM. 30 tablet 1   No current  facility-administered medications for this visit.     ALLERGIES: Sulfa antibiotics  Family History  Problem Relation Age of Onset   Cancer Mother        lung   Heart failure Father    Breast cancer Sister 35       dec age 52   Cancer Sister    Heart attack Brother 48   Heart attack Brother    Hypertension Son    Ovarian cancer Paternal Grandmother        dec age 41   Anesthesia problems Neg Hx    Hypotension Neg Hx    Malignant hyperthermia Neg Hx    Pseudochol deficiency Neg Hx    Colon cancer Neg Hx    Esophageal cancer Neg Hx    Rectal cancer Neg Hx    Stomach cancer Neg Hx     Social History   Socioeconomic History   Marital status: Widowed    Spouse name: Not on file   Number of children: 1   Years of education: Not on file   Highest education level: Not on file  Occupational History   Occupation: disabled  Tobacco Use   Smoking status: Every Day    Current packs/day: 1.50    Types: Cigarettes   Smokeless tobacco: Never  Vaping Use   Vaping status: Never Used  Substance and Sexual Activity   Alcohol use: No    Alcohol/week: 0.0 standard drinks of alcohol   Drug use: No   Sexual activity: Yes    Partners: Male    Birth control/protection: Surgical    Comment: TVH/ovaries retained  Other Topics Concern   Not on file  Social History Narrative   Not on file  Social Determinants of Health   Financial Resource Strain: Not on file  Food Insecurity: No Food Insecurity (04/10/2023)   Hunger Vital Sign    Worried About Running Out of Food in the Last Year: Never true    Ran Out of Food in the Last Year: Never true  Transportation Needs: No Transportation Needs (04/10/2023)   PRAPARE - Administrator, Civil Service (Medical): No    Lack of Transportation (Non-Medical): No  Physical Activity: Not on file  Stress: Not on file  Social Connections: Not on file  Intimate Partner Violence: Not At Risk (04/10/2023)   Humiliation, Afraid, Rape, and  Kick questionnaire    Fear of Current or Ex-Partner: No    Emotionally Abused: No    Physically Abused: No    Sexually Abused: No    Review of Systems  All other systems reviewed and are negative.   PHYSICAL EXAMINATION:    BP 120/76 (BP Location: Right Arm, Patient Position: Sitting, Cuff Size: Normal)   Pulse 77   Ht 5\' 7"  (1.702 m)   Wt 136 lb (61.7 kg)   LMP 10/23/1980 Comment: Hyst age 45(ovaries retained)  SpO2 93%   BMI 21.30 kg/m     General appearance: alert, cooperative and appears stated age  ASSESSMENT  Osteoporosis without current fracture.  Off Fosamax.  Reflux.  Hx COPD.   PLAN  We discussed alternatives to Fosamax:  Reclast and Prolia.  Risks and benefits reviewed.  Will precert Prolia. She will need an updated calcium level. Will order BMD for October 2024 at the [facility].   25 min  total time was spent for this patient encounter, including preparation, face-to-face counseling with the patient, coordination of care, and documentation of the encounter.

## 2023-06-09 NOTE — Progress Notes (Unsigned)
Dorothy [name], female    DOB: 1956/09/20    MRN: [id number]   Brief patient profile:  36  yowf  active smoker   referred to pulmonary clinic in [facility]  04/23/2023 by Mardella Layman NP  for new 02 dep resp failure s/p admit:    Admit date:     04/10/2023  Discharge date: 04/12/23      Recommendations at discharge:  Repeat basic metabolic panel to follow electrolytes and renal function Make sure patient has follow-up with pulmonologist as instructed Reassess blood pressure and adjust antihypertensive treatment as needed Continue assisting patient with a smoking cessation   Discharge Diagnoses:   COPD exacerbation (HCC)   Primary hypertension   Gastroesophageal reflux disease   Elevated troponin   Bronchiectasis with acute exacerbation [facility])   Brief Hospital admission narrative course: 67 y.o. female with medical history significant for COPD, tobacco abuse, anxiety/depression, hypertension, hypothyroidism, GERD, and dyslipidemia who was recently discharged on 5/27 after treatment for hypertensive crisis.  She presented to the ED with worsening shortness of breath over the last 2 weeks that worsened last night and prompted a call to EMS.  She was recently started on home oxygen per her primary care physician.  She denies a significant cough, fevers, chills, chest pain, or lower extremity edema.  She had significant wheezing requiring nonrebreather mask and nebulizer treatments on arrival.  She continues to smoke 1 pack/day.   ED Course: Vital signs with temperature 100.3 and otherwise stable heart rate and blood pressure.  Leukocytosis of 12,200 and sodium 128.  Troponin 81 and follow-up 168.  EKG with no significant findings aside from sinus tachycardia at 110 bpm.   Assessment and Plan: 1-COPD exacerbation acute/chronic respiratory failure -2 L nasal cannula supplementation in place -Patient denies chest pain and was able to speak in full sentences at time of discharge. -Continue  nebulizer as needed management, steroids tapering, Protonix twice a day, flutter valve and mucolytic's. -Outpatient follow-up with pulmonologist for PFTs and adjustment to maintenance therapy recommended; for now continue the use of trilogy. -Lisinopril has been substituted to losartan; minimizing bronchospasm component. -Continue oral antibiotics at time of discharge for treatment of bronchiectasis. -Continue home oxygen supplementation (2 L at baseline).   2-troponin elevation -No chest pain reported -Most likely demand ischemia -Telemetry and EKG with no acute abnormalities -2D echo reassuring with preserved ejection fraction and no wall motion abnormalities. -Patient received over 36 hours of heparin drip empirically -Continue treatment for COPD exacerbation and bronchiectasis   3-tobacco abuse -Cessation counseling provided -Continue nicotine patch. -Patient looking to stop smoking.   4-hypertension -Continue home antihypertensive agents -Stable blood pressure appreciated. -Lisinopril substituted by losartan.   5-GERD -Continue PPI.   6-dyslipidemia -Continue statin. -Heart healthy diet discussed with patient.   7-anxiety/depression -Continue Abilify and Klonopin. -Stable mood. -No suicidal ideation or hallucinations   8-hypothyroidism -Continue Synthroid -Continue to follow intermittent thyroid panel as an outpatient to further adjust Synthroid as needed.   9-history of chronic hyponatremia -Continue to follow electrolytes -Patient advised to maintain adequate hydration. -recently discontinuation of effexor and hydrochlorothiazide.   Consultants: None Procedures performed: See below for x-ray reports Disposition: Home Diet recommendation: Heart healthy diet.        History of Present Illness  04/23/2023  Pulmonary/ 1st office eval/ Dorothy [name] / [place] Office maint on trelegy 200 and nomodyne 100 mg bid   Dyspnea:  limited by poor eyesight, not breathing   Cough: rattle esp in am > slt  yellow Sleep: 30 degrees electric SABA use: once a day "it's a habit" 02: 3lpm 24/7  Rec I would prefer you avoid Fosamax (alendronate) based on you CT of your esophagus  Alternatives are Reclast one infusion per year or prolia injection twice a  year  Continue protonix (pantoprazole) Take 30- 60 min before your first and last meals of the day  Remain off cigarettes  Make sure you check your oxygen saturation  AT  your highest level of activity (not after you stop)   to be sure it stays over 90%  Plan A = Automatic = Always=    Trelegy 100 one click each am  Brush tongue and teeth with arm and hammer after use  Plan B = Backup (to supplement plan A, not to replace it) Only use your albuterol inhaler as a rescue medication  Plan C = Crisis (instead of Plan B but only if Plan B stops working) - only use your albuterol nebulizer if you first try Plan B For cough/ congestion > mucinex or mucinex dm  up to maximum of  1200 mg every 12 hours  as needed   Please schedule a follow up office visit in 6 weeks, call sooner if needed with all medications /inhalers/ solutions in hand    06/11/2023  f/u ov/[place] office/[name] re: prob copd recurrent smoker  maint on nothing   and did not bring meds / on normodyne 100 mg 1/2 daily  Chief Complaint  Patient presents with   COPD  Dyspnea:  MMRC2 = can't walk a nl pace on a flat grade s sob but does fine slow and flat / no change off trelegy which she can't afford  Cough: none  Sleeping: 30 degrees HOB  electric bed s resp cc  SABA use: none  02:  3lpm hs and most of time daytime     No obvious day to day or daytime variability or assoc excess/ purulent sputum or mucus plugs or hemoptysis or cp or chest tightness, subjective wheeze or overt sinus or hb symptoms.     Also denies any obvious fluctuation of symptoms with weather or environmental changes or other aggravating or alleviating factors except as outlined  above   No unusual exposure hx or h/o childhood pna/ asthma or knowledge of premature birth.  Current Allergies, Complete Past Medical History, Past Surgical History, Family History, and Social History were reviewed in [employer] record.  ROS  The following are not active complaints unless bolded Hoarseness, sore throat, dysphagia, dental problems, itching, sneezing,  nasal congestion or discharge of excess mucus or purulent secretions, ear ache,   fever, chills, sweats, unintended wt loss or wt gain, classically pleuritic or exertional cp,  orthopnea pnd or arm/hand swelling  or leg swelling, presyncope, palpitations, abdominal pain, anorexia, nausea, vomiting, diarrhea  or change in bowel habits or change in bladder habits, change in stools or change in urine, dysuria, hematuria,  rash, arthralgias, visual complaints, headache, numbness, weakness or ataxia or problems with walking or coordination,  change in mood or  memory.             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   Benign essential HTN 01/17/2012   Cancer (HCC) 1982   ovarian   Cancer of cervix (HCC)    Constipation    Depression  Takes Klonopin   Depression with anxiety 01/17/2012   GERD (gastroesophageal reflux disease)    Hypercholesteremia    Hypertension    takes meds   Hypothyroidism    on Levonthyroxine   Hypothyroidism (acquired) 01/17/2012   Since age 38   Low sodium levels 09/25/2022   Na level 125 at [facility]   Osteopenia 2018   forearm   Osteoporosis 07/25/2021   forearm   Ovarian cyst 01/17/2012   1995  BSO   Personal history of colonic polyps 11/07/2007   Pneumonia    x 2- last time > 5 years ago   Pneumonia 01/17/2012   10 yrs ago and 5 years ago    Rash and other nonspecific skin eruption 01/17/2012   Dorsum hands & extensor surfaces arms;  intermittent      Objective:    Wt Readings from Last 3 Encounters:  06/11/23 138 lb (62.6 kg)  04/23/23 120 lb 3.2 oz (54.5 kg)  04/18/23 122 lb (55.3 kg)     Vital signs reviewed  06/11/2023  - Note at rest 02 sats  96% on RA   General appearance:    elderly amb wf / flat affect/smoker's rattle    HEENT : Oropharynx  clear   NECK :  without  apparent JVD/ palpable Nodes/TM    LUNGS: no acc muscle use,  Min barrel  contour chest wall with bilateral  coarse insp /rhonchi  and  without cough on insp or exp maneuvers and min  Hyperresonant  to  percussion bilaterally    CV:  RRR  no s3 or murmur or increase in P2, and no edema   ABD:  soft and nontender with pos end  insp Hoover's  in the supine position.  No bruits or organomegaly appreciated   MS:  Nl gait/ ext warm without deformities Or obvious joint restrictions  calf tenderness, cyanosis or clubbing     SKIN: warm and dry without lesions    NEURO:  alert, approp, nl sensorium with  no motor or cerebellar deficits apparent.                  Assessment

## 2023-06-11 ENCOUNTER — Encounter: Payer: Self-pay | Admitting: Internal Medicine

## 2023-06-11 ENCOUNTER — Ambulatory Visit: Payer: Medicare HMO | Admitting: Internal Medicine

## 2023-06-11 VITALS — BP 144/78 | HR 64 | Ht 66.0 in | Wt 138.0 lb

## 2023-06-11 DIAGNOSIS — I10 Essential (primary) hypertension: Secondary | ICD-10-CM

## 2023-06-11 DIAGNOSIS — J9611 Chronic respiratory failure with hypoxia: Secondary | ICD-10-CM | POA: Diagnosis not present

## 2023-06-11 DIAGNOSIS — J449 Chronic obstructive pulmonary disease, unspecified: Secondary | ICD-10-CM | POA: Diagnosis not present

## 2023-06-11 DIAGNOSIS — F17210 Nicotine dependence, cigarettes, uncomplicated: Secondary | ICD-10-CM | POA: Diagnosis not present

## 2023-06-11 DIAGNOSIS — M810 Age-related osteoporosis without current pathological fracture: Secondary | ICD-10-CM | POA: Insufficient documentation

## 2023-06-11 MED ORDER — FLUTICASONE-SALMETEROL 250-50 MCG/ACT IN AEPB
1.0000 | INHALATION_SPRAY | Freq: Two times a day (BID) | RESPIRATORY_TRACT | 11 refills | Status: DC
Start: 2023-06-11 — End: 2023-06-11

## 2023-06-11 NOTE — Assessment & Plan Note (Addendum)
Active smoker s/p  admit with aecopd/ acute on chronic resp failure 04/10/23  - CTa 04/08/23  emphysema only  - 04/23/2023  After extensive coaching inhaler device,  effectiveness =    75% with DPI/ elipta (limited by eyesight to having her sister open it and administer)   As I explained to this patient in detail:  although there may be copd present, it may not be clinically relevant:   it does not appear to be limiting activity tolerance  as this pt is quite sedentary I don't recommend aggressive pulmonary rx at this point unless limiting symptoms arise or acute exacerbations become as issue, neither of which is the case now.  I asked the patient to contact this office at any time in the future should either of these problems arise.    >>> for now rec duoneb qid and return with pfts in 42m

## 2023-06-11 NOTE — Assessment & Plan Note (Signed)
Note using 2 different non-selective BB  Concomitant use of B Blockers in pts with severe COPD who are 02 dependent increased mortality by about 20% in one recently published study, so if B blockers must be used Strongly prefer in this setting: Bystolic, the most beta -1  selective Beta blocker available in sample form, with bisoprolol the most selective generic choice  on the market.   Am Rev Resp and CCM  Vol 187 p 715-720 (2013).   Rec consider subsitutes for labetaolol and timolol (eyedrops)  but not urgent for now         Each maintenance medication was reviewed in detail including emphasizing most importantly the difference between maintenance and prns and under what circumstances the prns are to be triggered using an action plan format where appropriate.  Total time for H and P, chart review, counseling, reviewing hfa/neb/02  device(s) , directly observing portions of ambulatory 02 saturation study/ and generating customized AVS unique to this office visit / same day charting > 30 min for multiple respiratory  symptoms of

## 2023-06-11 NOTE — Addendum Note (Signed)
Addended by: Shelby Dubin on: 06/11/2023 02:52 PM   Modules accepted: Orders

## 2023-06-11 NOTE — Assessment & Plan Note (Signed)
Placed on 02 by PCP prior to admit 04/10/23  - 06/11/2023   Walked on RA  x  3  lap(s) =  approx 450  ft  @ nl  pace, stopped due to end of study  with lowest 02 sats 97% so changed to 2lpm hs and prn daytime

## 2023-06-11 NOTE — Assessment & Plan Note (Signed)
Counseled re importance of smoking cessation but did not meet time criteria for separate billing   °

## 2023-06-11 NOTE — Patient Instructions (Addendum)
Duoneb up to 4  x daily as needed for breathing difficulty cough or wheeze  For cough/ congestion > mucinex or mucinex dm  up to maximum of  1200 mg every 12 hours as needed   The key is to stop smoking completely before smoking completely stops you!  Ok to just ust 02 2lpm at bedtime until  return    Please schedule a follow up visit in 3 months but call sooner if needed PFTs on return

## 2023-06-13 ENCOUNTER — Encounter: Payer: Self-pay | Admitting: Obstetrics and Gynecology

## 2023-06-13 ENCOUNTER — Telehealth: Payer: Self-pay | Admitting: Obstetrics and Gynecology

## 2023-06-13 ENCOUNTER — Ambulatory Visit: Payer: Medicare HMO | Admitting: Obstetrics and Gynecology

## 2023-06-13 VITALS — BP 120/76 | HR 77 | Ht 67.0 in | Wt 136.0 lb

## 2023-06-13 DIAGNOSIS — M810 Age-related osteoporosis without current pathological fracture: Secondary | ICD-10-CM

## 2023-06-13 NOTE — Telephone Encounter (Signed)
Please assist patient in scheduling bone density in October, 2024 at the [facility] for my patient who as osteoporosis without current pathologic fracture.   Please also proceed with precert for Prolia.  Her pulmonologist has recommended she stop Fosamax due her reflux.  See his note in Epic 06/11/23.

## 2023-06-13 NOTE — Patient Instructions (Addendum)
Denosumab Injection (Osteoporosis) - Prolia What is this medication? DENOSUMAB (den oh SUE mab) prevents and treats osteoporosis. It works by Interior and spatial designer stronger and less likely to break (fracture). It is a monoclonal antibody. This medicine may be used for other purposes; ask your health care provider or pharmacist if you have questions. COMMON BRAND NAME(S): Prolia What should I tell my care team before I take this medication? They need to know if you have any of these conditions: Dental or gum disease Had thyroid or parathyroid (glands located in neck) surgery Having dental surgery or a tooth pulled Kidney disease Low levels of calcium in the blood On dialysis Poor nutrition Thyroid disease Trouble absorbing nutrients from your food An unusual or allergic reaction to denosumab, other medications, foods, dyes, or preservatives Pregnant or trying to get pregnant Breastfeeding How should I use this medication? This medication is injected under the skin. It is given by your care team in a hospital or clinic setting. A special MedGuide will be given to you before each treatment. Be sure to read this information carefully each time. Talk to your care team about the use of this medication in children. Special care may be needed. Overdosage: If you think you have taken too much of this medicine contact a poison control center or emergency room at once. NOTE: This medicine is only for you. Do not share this medicine with others. What if I miss a dose? Keep appointments for follow-up doses. It is important not to miss your dose. Call your care team if you are unable to keep an appointment. What may interact with this medication? Do not take this medication with any of the following: Other medications that contain denosumab This medication may also interact with the following: Medications that lower your chance of fighting infection Steroid medications, such as prednisone or  cortisone This list may not describe all possible interactions. Give your health care provider a list of all the medicines, herbs, non-prescription drugs, or dietary supplements you use. Also tell them if you smoke, drink alcohol, or use illegal drugs. Some items may interact with your medicine. What should I watch for while using this medication? Your condition will be monitored carefully while you are receiving this medication. You may need blood work done while taking this medication. This medication may increase your risk of getting an infection. Call your care team for advice if you get a fever, chills, sore throat, or other symptoms of a cold or flu. Do not treat yourself. Try to avoid being around people who are sick. Tell your dentist and dental surgeon that you are taking this medication. You should not have major dental surgery while on this medication. See your dentist to have a dental exam and fix any dental problems before starting this medication. Take good care of your teeth while on this medication. Make sure you see your dentist for regular follow-up appointments. This medication may cause low levels of calcium in your body. The risk of severe side effects is increased in people with kidney disease. Your care team may prescribe calcium and vitamin D to help prevent low calcium levels while you take this medication. It is important to take calcium and vitamin D as directed by your care team. Talk to your care team if you may be pregnant. Serious birth defects may occur if you take this medication during pregnancy and for 5 months after the last dose. You will need a negative pregnancy test before starting this  medication. Contraception is recommended while taking this medication and for 5 months after the last dose. Your care team can help you find the option that works for you. Talk to your care team before breastfeeding. Changes to your treatment plan may be needed. What side effects may  I notice from receiving this medication? Side effects that you should report to your care team as soon as possible: Allergic reactions--skin rash, itching, hives, swelling of the face, lips, tongue, or throat Infection--fever, chills, cough, sore throat, wounds that don't heal, pain or trouble when passing urine, general feeling of discomfort or being unwell Low calcium level--muscle pain or cramps, confusion, tingling, or numbness in the hands or feet Osteonecrosis of the jaw--pain, swelling, or redness in the mouth, numbness of the jaw, poor healing after dental work, unusual discharge from the mouth, visible bones in the mouth Severe bone, joint, or muscle pain Skin infection--skin redness, swelling, warmth, or pain Side effects that usually do not require medical attention (report these to your care team if they continue or are bothersome): Back pain Headache Joint pain Muscle pain Pain in the hands, arms, legs, or feet Runny or stuffy nose Sore throat This list may not describe all possible side effects. Call your doctor for medical advice about side effects. You may report side effects to FDA at 1-800-FDA-1088. Where should I keep my medication? This medication is given in a hospital or clinic. It will not be stored at home. NOTE: This sheet is a summary. It may not cover all possible information. If you have questions about this medicine, talk to your doctor, pharmacist, or health care provider.  2024 Elsevier/Gold Standard (2022-11-14 00:00:00)

## 2023-06-14 NOTE — Telephone Encounter (Signed)
Call placed to [facility], spoke with Laquita. Next available BMD 11/2023. Scheduled for 12/18/23 at 0800.  Patient may check with TBC for earlier appts and cancellations.    Spoke with patient. Advised as seen above. Patient verbalizes understanding and is agreeable. Contact information priovided for TBC.   Submitted to [employer] for review of benefits for Prolia.  Routing to [place] C for f/u of Prolia.

## 2023-06-19 NOTE — Telephone Encounter (Signed)
PA filled out for prolia and taken to Dr. Rica Records desk for review and signature. Will fax once signed.

## 2023-06-28 NOTE — Telephone Encounter (Signed)
PA for Prolia signed by Dr. Edward Jolly and faxed to [facility] Department, fax #: [phone]. Will await response.

## 2023-07-11 LAB — LAB REPORT - SCANNED: EGFR: 55

## 2023-07-19 NOTE — Telephone Encounter (Signed)
Call to [facility] in follow up to PA sent on 06-28-23. Hospital doctor, representative, states PA never received. Fax number provided for [facility] department. [phone]. Will refax PA now and await response.

## 2023-07-19 NOTE — Telephone Encounter (Signed)
Call to patient. [employer] approved prior authorization for prolia. Patient advised and benefits reviewed with patient. Patient's estimated OOP cost for prolia is ~$383.91, patient is asking if there is anything more cost effective that she can take versus the prolia? RN advised would review with Dr. Edward Jolly and return call with recommendations. Patient agreeable. Aware Dr. Edward Jolly is out of the office this afternoon and our office closes early tomorrow. Response may be first of next week. Patient okay with plan.   Routing to provider for review.

## 2023-07-19 NOTE — Telephone Encounter (Signed)
I recommend Reclast infusion as an alternative to Prolia.  The patient and I discussed this option at her office visit.   Please precert Reclast.   Of note, her calcium needs to be normalized first for treatment with either Prolia or Reclast.   I thinks she takes calcium gummies? Her calcium level is consisently low on chart review in Epic.   She may want to consider daily Tums, 1000 mg daily, instead of gummies.  Dairy products also contain calcium.  Two servings of dairy a day will help to reach our goal of her calcium and vitamin D intake.  Her calcium would need to be rechecked prior to receiving Prolia or Reclast.

## 2023-07-19 NOTE — Telephone Encounter (Signed)
Prior Authorization refaxed to [employer] at [phone]. Will await response.

## 2023-07-24 NOTE — Telephone Encounter (Addendum)
Reclast instructions   Annual Exam (1 year) ? OV: 06-13-23 BS  Called pt to verify insurance coverage / inform pt Reclast is in Process ? Yes, see previous note  Labs must be in 30 days window Serum Creatinine DATE? Pt to have drawn with PCP  Serum Calcium DATE? Pt to have drawn with PCP    PA needed? No  Cost for Pt? 20% patient responsibility: $64.97 (96365) + $16.01 (U9811) = $80.98  Order form filled out and faxed  w/MD sig? Will await lab work   Infusion will be done at ? [facility]   Pt aware?  Instructions mailed to pt?

## 2023-07-24 NOTE — Telephone Encounter (Signed)
Call to patient. Benefits for reclast reviewed with patient as well as recommendations from Dr. Edward Jolly and she verbalized understanding. Patient requesting to have blood work done with PCP because it is closer to where she lives. Will have PCP fax results to our office for Dr. Edward Jolly to review. Fax number provided.

## 2023-07-24 NOTE — Telephone Encounter (Signed)
Call to [facility], spoke with Jonny Ruiz, Customer Service Advocate, for summary of benefits for reclast. Jonny Ruiz states, no deductible required. Patient responsibility is 20%. OOP max is $4500 ($2512.81 met). Once met, coverage increases to 100%. Reference #: [id number].   Call to [facility] precertification department, no PA required per plan for [phone] or [phone]. Reference #: [id number].

## 2023-08-01 NOTE — Telephone Encounter (Signed)
Labs received from PCP from 07/10/23.   Creatinine 1.10 Calcium 9.0.   Creatinine clearance is 49 ml/min  Ok to proceed with Reclast.

## 2023-08-03 ENCOUNTER — Other Ambulatory Visit: Payer: Self-pay | Admitting: Nurse Practitioner

## 2023-08-03 DIAGNOSIS — Z1231 Encounter for screening mammogram for malignant neoplasm of breast: Secondary | ICD-10-CM

## 2023-08-14 ENCOUNTER — Ambulatory Visit (HOSPITAL_COMMUNITY)
Admission: RE | Admit: 2023-08-14 | Discharge: 2023-08-14 | Disposition: A | Discharge status: Home / Self Care | Payer: Medicare HMO | Source: Ambulatory Visit | Attending: Internal Medicine | Admitting: Internal Medicine

## 2023-08-14 ENCOUNTER — Telehealth: Payer: Self-pay | Admitting: *Deleted

## 2023-08-14 DIAGNOSIS — J449 Chronic obstructive pulmonary disease, unspecified: Secondary | ICD-10-CM | POA: Insufficient documentation

## 2023-08-14 LAB — PULMONARY FUNCTION TEST
DL/VA % pred: 73 %
DL/VA: 3.02 ml/min/mmHg/L
DLCO unc % pred: 62 %
DLCO unc: 13.47 ml/min/mmHg
FEF 25-75 Post: 0.82 L/s
FEF 25-75 Pre: 0.87 L/s
FEF2575-%Change-Post: -5 %
FEF2575-%Pred-Post: 36 %
FEF2575-%Pred-Pre: 38 %
FEV1-%Change-Post: 0 %
FEV1-%Pred-Post: 76 %
FEV1-%Pred-Pre: 76 %
FEV1-Post: 2.04 L
FEV1-Pre: 2.04 L
FEV1FVC-%Change-Post: 3 %
FEV1FVC-%Pred-Pre: 77 %
FEV6-%Change-Post: -1 %
FEV6-%Pred-Post: 90 %
FEV6-%Pred-Pre: 91 %
FEV6-Post: 3.04 L
FEV6-Pre: 3.1 L
FEV6FVC-%Change-Post: 1 %
FEV6FVC-%Pred-Post: 95 %
FEV6FVC-%Pred-Pre: 94 %
FVC-%Change-Post: -3 %
FVC-%Pred-Post: 94 %
FVC-%Pred-Pre: 97 %
FVC-Post: 3.31 L
FVC-Pre: 3.41 L
Post FEV1/FVC ratio: 62 %
Post FEV6/FVC ratio: 92 %
Pre FEV1/FVC ratio: 60 %
Pre FEV6/FVC Ratio: 91 %

## 2023-08-14 MED ORDER — ALBUTEROL SULFATE (2.5 MG/3ML) 0.083% IN NEBU
2.5000 mg | INHALATION_SOLUTION | Freq: Once | RESPIRATORY_TRACT | Status: AC
  Administered 2023-08-14: 2.5 mg via RESPIRATORY_TRACT

## 2023-08-14 NOTE — Telephone Encounter (Signed)
Call to patient. Labs from 07-10-23 outside of 30 day window. Patient scheduled for labs on 08-29-23. Patient agreeable to date and time of appointment.   Future orders placed.

## 2023-08-14 NOTE — Telephone Encounter (Signed)
Opened in error

## 2023-08-29 ENCOUNTER — Other Ambulatory Visit: Payer: Medicare HMO

## 2023-08-29 ENCOUNTER — Ambulatory Visit: Payer: Medicare HMO

## 2023-08-29 DIAGNOSIS — M810 Age-related osteoporosis without current pathological fracture: Secondary | ICD-10-CM

## 2023-08-30 ENCOUNTER — Telehealth: Payer: Self-pay | Admitting: *Deleted

## 2023-08-30 LAB — CALCIUM: Calcium: 9.5 mg/dL (ref 8.6–10.4)

## 2023-08-30 LAB — CREATININE, SERUM: Creat: 1.15 mg/dL — ABNORMAL HIGH (ref 0.50–1.05)

## 2023-08-30 NOTE — Telephone Encounter (Signed)
-----   Message from Melony Overly sent at 08/30/2023  1:02 PM EST ----- Cr 1.15,  Ca 9.5 Cr clearance 47  Hi Cissy,   Your labs look ok to proceed with Reclast.   I will have the nurse reach out to help coordinate next steps.   Conley Simmonds, MD

## 2023-08-30 NOTE — Telephone Encounter (Signed)
Order for Reclast filled out and taken to Dr. Rica Records desk to review and sign.   Routing to provider for review.

## 2023-08-30 NOTE — Telephone Encounter (Signed)
Error. See encounter dated 08/14/23.

## 2023-08-30 NOTE — Telephone Encounter (Signed)
Patton Salles, MD  Arnold Long T, RN Cr 1.15, Ca 9.5 Cr clearance 47  Hi Shaunice,  Your labs look ok to proceed with Reclast.  I will have the nurse reach out to help coordinate next steps.  Conley Simmonds, MD

## 2023-09-03 NOTE — Telephone Encounter (Signed)
Order for Reclast signed and placed on your desk.

## 2023-09-04 NOTE — Telephone Encounter (Signed)
Call to patient. Message given to patient that Reclast infusion scheduled for 09-18-23 at 1100. Patient agreeable to date and time of appointment. Order has been faxed to Infusion [facility] at [facility]. Reclast instructions reviewed with patient and advised would mail her a copy of instructions. Address on file verified.   Encounter closed.

## 2023-09-11 ENCOUNTER — Ambulatory Visit: Payer: Medicare HMO | Admitting: Internal Medicine

## 2023-09-11 ENCOUNTER — Encounter: Payer: Self-pay | Admitting: Internal Medicine

## 2023-09-11 VITALS — BP 152/76 | HR 62 | Ht 67.0 in | Wt 144.0 lb

## 2023-09-11 DIAGNOSIS — J9611 Chronic respiratory failure with hypoxia: Secondary | ICD-10-CM

## 2023-09-11 DIAGNOSIS — J449 Chronic obstructive pulmonary disease, unspecified: Secondary | ICD-10-CM

## 2023-09-11 DIAGNOSIS — F17210 Nicotine dependence, cigarettes, uncomplicated: Secondary | ICD-10-CM

## 2023-09-11 NOTE — Assessment & Plan Note (Signed)
4-5 min discussion re active cigarette smoking in addition to office E&M  Ask about tobacco use:   ongoing/ still 0.5 ppd Advise quitting   I took an extended  opportunity with this patient to outline the consequences of continued cigarette use  in airway disorders based on all the data we have from the multiple national lung health studies (perfomed over decades at millions of dollars in cost)  indicating that smoking cessation, not choice of inhalers or pulmonary physicians, is the most important aspect of her  care.   Assess willingness:  Not committed at this point Assist in quit attempt:  Per PCP when ready Arrange follow up:   Follow up per Primary Care planned

## 2023-09-11 NOTE — Patient Instructions (Addendum)
Decrease 02 2lpm at bedtime   Make sure you check your oxygen saturation maybe once a week   AT  your highest level of activity (not after you stop)   to be sure it stays over 90% and adjust  02 flow upward to maintain this level if needed but remember to turn it back to previous settings when you stop (to conserve your supply).   Ok to decrease trelegy to three x a week (Monday, Wed, Friday) as long as not smoking  .Please schedule a follow up visit in  6 months but call sooner if needed

## 2023-09-11 NOTE — Assessment & Plan Note (Addendum)
Placed on 02 by PCP prior to admit 04/10/23  - 06/11/2023   Walked on RA  x  3  lap(s) =  approx 450  ft  @ nl  pace, stopped due to end of study  with lowest 02 sats 97% so changed to 2lpm hs and prn daytime   Sats 98% RA today so no need for daytime 02 - can use prn sats < 90% with ex  Ok to drop back to 2 lpm hs   F/u  q 6 m         Each maintenance medication was reviewed in detail including emphasizing most importantly the difference between maintenance and prns and under what circumstances the prns are to be triggered using an action plan format where appropriate.  Total time for H and P, chart review, counseling, reviewing hfa/ dpi/ 02/pulse ox  device(s) and generating customized AVS unique to this office visit / same day charting = 20 min

## 2023-09-11 NOTE — Assessment & Plan Note (Signed)
Active smoker s/p  admit with aecopd/ acute on chronic resp failure 04/10/23  - CTa 04/08/23  emphysema only  - 04/23/2023  After extensive coaching inhaler device,  effectiveness =    75% with DPI/ elipta (limited by eyesight to having her sister open it and administer)  - PFT's  08/14/23  FEV1 2.04 (76 % ) ratio 0.62  p 0 % improvement from saba p trelegy  prior to study with DLCO  13.5 (62%)   and FV curve mildly concave     Group D (now reclassified as E) in terms of symptom/risk and laba/lama/ICS  therefore appropriate rx at this point >>>  trelegy 100 - doing so well probably ok to drop back to three times per week for cost savings but increase back to q am for the first sign of decline or need for saba

## 2023-09-11 NOTE — Progress Notes (Addendum)
Dorothy [name], female    DOB: Jan 03, 1956    MRN: [id number]   Brief patient profile:  11  yowf  active smoker   referred to pulmonary clinic in [facility]  04/23/2023 by Dorothy Layman NP  for new 02 dep resp failure s/p admit:    Admit date:     04/10/2023  Discharge date: 04/12/23         Discharge Diagnoses:   COPD exacerbation (HCC)   Primary hypertension   Gastroesophageal reflux disease   Elevated troponin   Bronchiectasis with acute exacerbation Dorothy [name])   Brief Hospital admission narrative course: 67 y.o. female with medical history significant for COPD, tobacco abuse, anxiety/depression, hypertension, hypothyroidism, GERD, and dyslipidemia who was recently discharged on 5/27 after treatment for hypertensive crisis.  She presented to the ED with worsening shortness of breath over the last 2 weeks that worsened last night and prompted a call to EMS.  She was recently started on home oxygen per her primary care physician.  She denies a significant cough, fevers, chills, chest pain, or lower extremity edema.  She had significant wheezing requiring nonrebreather mask and nebulizer treatments on arrival.  She continues to smoke 1 pack/day.   ED Course: Vital signs with temperature 100.3 and otherwise stable heart rate and blood pressure.  Leukocytosis of 12,200 and sodium 128.  Troponin 81 and follow-up 168.  EKG with no significant findings aside from sinus tachycardia at 110 bpm.   Assessment and Plan: 1-COPD exacerbation acute/chronic respiratory failure -2 L nasal cannula supplementation in place -Patient denies chest pain and was able to speak in full sentences at time of discharge. -Continue nebulizer as needed management, steroids tapering, Protonix twice a day, flutter valve and mucolytic's. -Outpatient follow-up with pulmonologist for PFTs and adjustment to maintenance therapy recommended; for now continue the use of trilogy. -Lisinopril has been substituted to losartan; minimizing  bronchospasm component. -Continue oral antibiotics at time of discharge for treatment of bronchiectasis. -Continue home oxygen supplementation (2 L at baseline).   2-troponin elevation -No chest pain reported -Most likely demand ischemia -Telemetry and EKG with no acute abnormalities -2D echo reassuring with preserved ejection fraction and no wall motion abnormalities. -Patient received over 36 hours of heparin drip empirically -Continue treatment for COPD exacerbation and bronchiectasis   3-tobacco abuse -Cessation counseling provided -Continue nicotine patch. -Patient looking to stop smoking.   4-hypertension -Continue home antihypertensive agents -Stable blood pressure appreciated. -Lisinopril substituted by losartan.   5-GERD -Continue PPI.   6-dyslipidemia -Continue statin. -Heart healthy diet discussed with patient.   7-anxiety/depression -Continue Abilify and Klonopin. -Stable mood. -No suicidal ideation or hallucinations   8-hypothyroidism -Continue Synthroid -Continue to follow intermittent thyroid panel as an outpatient to further adjust Synthroid as needed.   9-history of chronic hyponatremia -Continue to follow electrolytes -Patient advised to maintain adequate hydration. -recently discontinuation of effexor and hydrochlorothiazide.   Consultants: None Procedures performed: See below for x-ray reports Disposition: Home Diet recommendation: Heart healthy diet.        History of Present Illness  04/23/2023  Pulmonary/ 1st [name] eval/ Dorothy [name] on trelegy 200 and nomodyne 100 mg bid   Dyspnea:  limited by poor eyesight, not breathing  Cough: rattle esp in am > slt yellow Sleep: 30 degrees electric SABA use: once a day "it's a habit" 02: 3lpm 24/7  Rec I would prefer you avoid Fosamax (alendronate) based on you CT of your esophagus  Alternatives are Reclast one infusion per  year or prolia injection twice a  year  Continue  protonix (pantoprazole) Take 30- 60 min before your first and last meals of the day  Remain off cigarettes  Make sure you check your oxygen saturation  AT  your highest level of activity (not after you stop)   to be sure it stays over 90%  Plan A = Automatic = Always=    Trelegy 100 one click each am  Brush tongue and teeth with arm and hammer after use  Plan B = Backup (to supplement plan A, not to replace it) Only use your albuterol inhaler as a rescue medication  Plan C = Crisis (instead of Plan B but only if Plan B stops working) - only use your albuterol nebulizer if you first try Plan B For cough/ congestion > mucinex or mucinex dm  up to maximum of  1200 mg every 12 hours  as needed   Please schedule a follow up [name] visit in 6 weeks, call sooner if needed with all medications /inhalers/ solutions in hand    06/11/2023  f/u ov/Dorothy [name]/Dorothy [name] re: prob copd recurrent smoker  [name] on nothing   and did not bring meds / on normodyne 100 mg 1/2 daily  Chief Complaint  Patient presents with   COPD  Dyspnea:  MMRC2 = can't walk a nl pace on a flat grade s sob but does fine slow and flat / no change off trelegy which she can't afford  Cough: none  Sleeping: 30 degrees HOB  electric bed s resp cc  SABA use: none  02:  3lpm hs and most of time daytime  Rec Duoneb up to 4  x daily as needed for breathing difficulty cough or wheeze For cough/ congestion > mucinex or mucinex dm  up to maximum of  1200 mg every 12 hours as needed  The key is to stop smoking completely before smoking completely stops you! Ok to just ust 02 2lpm at bedtime until  return        09/11/2023  3 m f/u ov/Dorothy [name]/Dorothy [name] re: GOLD 2 copd/ 02 hs and prn daytime   [name] on trelegy   Chief Complaint  Patient presents with   Shortness of Breath   Dyspnea:  grocery shopping [employer]  Cough: min in am mucoid   Sleeping: 30 degrees hob/ no    resp cc  SABA use: none 02: 3 lpm hs   Lung cancer  screening: not due until 03/2024    No obvious day to day or daytime variability or assoc excess/ purulent sputum or mucus plugs or hemoptysis or cp or chest tightness, subjective wheeze or overt sinus or hb symptoms.    Also denies any obvious fluctuation of symptoms with weather or environmental changes or other aggravating or alleviating factors except as outlined above   No unusual exposure hx or h/o childhood pna/ asthma or knowledge of premature birth.  Current Allergies, Complete Past Medical History, Past Surgical History, Family History, and Social History were reviewed in [employer] record.  ROS  The following are not active complaints unless bolded Hoarseness, sore throat, dysphagia, dental problems, itching, sneezing,  nasal congestion or discharge of excess mucus or purulent secretions, ear ache,   fever, chills, sweats, unintended wt loss or wt gain, classically pleuritic or exertional cp,  orthopnea pnd or arm/hand swelling  or leg swelling, presyncope, palpitations, abdominal pain, anorexia, nausea, vomiting, diarrhea  or change in bowel habits or change in  bladder habits, change in stools or change in urine, dysuria, hematuria,  rash, arthralgias, visual complaints, headache, numbness, weakness or ataxia or problems with walking or coordination,  change in mood or  memory.        Current Meds  Medication Sig   acetaZOLAMIDE ER (DIAMOX) 500 MG capsule Take 1 capsule by mouth 2 (two) times daily.   alendronate (FOSAMAX) 70 MG tablet Take 70 mg by mouth once a week.   ARIPiprazole (ABILIFY) 10 MG tablet Take 5 mg by mouth daily. Takes 1/2 daily   aspirin EC 81 MG tablet Take 81 mg by mouth daily. Swallow whole.   atorvastatin (LIPITOR) 40 MG tablet Take 40 mg by mouth daily.   azelastine (ASTELIN) 0.1 % nasal spray Place 2 sprays into both nostrils 2 (two) times daily.   brimonidine (ALPHAGAN) 0.2 % ophthalmic solution Place 1 drop into the right eye 3  (three) times daily.   CALCIUM PO Take 3 tablets by mouth daily.   Cholecalciferol 50 MCG (2000 UT) TABS Take 1 tablet by mouth daily at 6 (six) AM.   clonazePAM (KLONOPIN) 0.5 MG tablet Take 1 mg by mouth 2 (two) times daily.   dorzolamide-timolol (COSOPT) 22.3-6.8 MG/ML ophthalmic solution Place 1 drop into the right eye 2 (two) times daily.   ipratropium-albuterol (DUONEB) 0.5-2.5 (3) MG/3ML SOLN Take 3 mLs by nebulization every 6 (six) hours as needed.   labetalol (NORMODYNE) 100 MG tablet Take 1 tablet (100 mg total) by mouth 2 (two) times daily.   latanoprost (XALATAN) 0.005 % ophthalmic solution Place 1 drop into the right eye at bedtime.   levocetirizine (XYZAL) 5 MG tablet Take 5 mg by mouth in the morning.   losartan (COZAAR) 25 MG tablet Take 1 tablet (25 mg total) by mouth daily.   Melatonin (MELATONIN EXTRA STRENGTH) 10 MG TABS Take 1 tablet by mouth at bedtime.   montelukast (SINGULAIR) 10 MG tablet Take 10 mg by mouth at bedtime.   Multiple Vitamin (MULTI-VITAMIN) tablet Take 2 tablets by mouth daily.   nicotine (NICODERM CQ - DOSED IN MG/24 HOURS) 21 mg/24hr patch Place 1 patch (21 mg total) onto the skin daily.   pantoprazole (PROTONIX) 40 MG tablet Take 1 tablet (40 mg total) by mouth 2 (two) times daily.   TRELEGY ELLIPTA 100-62.5-25 MCG/ACT AEPB Inhale 1 puff into the lungs daily.   zolpidem (AMBIEN) 10 MG tablet Take 10 mg by mouth at bedtime as needed for sleep.               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   Benign essential HTN 01/17/2012   Cancer (HCC) 1982   ovarian   Cancer of cervix (HCC)    Constipation    Depression    Takes Klonopin   Depression with anxiety 01/17/2012   GERD (gastroesophageal reflux disease)    Hypercholesteremia    Hypertension    takes meds   Hypothyroidism    on Levonthyroxine   Hypothyroidism (acquired) 01/17/2012    Since age 34   Low sodium levels 09/25/2022   Na level 125 at [facility]   Osteopenia 2018   forearm   Osteoporosis 07/25/2021   forearm   Ovarian cyst 01/17/2012   1995  BSO   Personal history of colonic polyps 11/07/2007   Pneumonia  x 2- last time > 5 years ago   Pneumonia 01/17/2012   10 yrs ago and 5 years ago    Rash and other nonspecific skin eruption 01/17/2012   Dorsum hands & extensor surfaces arms; intermittent      Objective:    Wts  09/11/2023     144   06/11/23 138 lb (62.6 kg)  04/23/23 120 lb 3.2 oz (54.5 kg)  04/18/23 122 lb (55.3 kg)    Vital signs reviewed  09/11/2023  - Note at rest 02 sats  98% on RA   General appearance:     amb wf /slt rattling cough    HEENT : Oropharynx  clear   Nasal turbinates nl    NECK :  without  apparent JVD/ palpable Nodes/TM    LUNGS: no acc muscle use,  Min barrel  contour chest wall with bilateral  slightly decreased bs s audible wheeze and  without cough on insp or exp maneuvers and min  Hyperresonant  to  percussion bilaterally    CV:  RRR  no s3 or murmur or increase in P2, and no edema   ABD:  soft and nontender   MS:  Nl gait/ ext warm without deformities Or obvious joint restrictions  calf tenderness, cyanosis or clubbing     SKIN: warm and dry without lesions    NEURO:  alert, approp, nl sensorium with  no motor or cerebellar deficits apparent.           Assessment

## 2023-09-13 NOTE — Progress Notes (Signed)
67 y.o. G25P1011 Widowed Caucasian female here for breast and pelvic exam.   Patient is followed for abnormal paps and osteoporosis.   Did her first Reclast 09/18/23.   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  Has laser surgery of her left eye and has decreased vision there.    PCP: Erasmo Downer, NP   Patient's last menstrual period was 10/23/1980.           Sexually active: No.  The current method of family planning is status post hysterectomy.    Menopausal hormone therapy:  n/a Exercising: No.     Smoker:  yes  OB History  Gravida Para Term Preterm AB Living  2 1 1   1 1   SAB IAB Ectopic Multiple Live Births  1            # Outcome Date GA Lbr Len/2nd Weight Sex Type Anes PTL Lv  2 SAB           1 Term              HEALTH MAINTENANCE: Last 2 paps:  09/21/22 neg: HR HPV neg, 08/12/2021 neg,hpv neg, 07-14-20 LGSIL HPV HR neg/colpo 08/16/20:  vaginal cuff biopsies LGSIL. pap 04-16-19 neg HPV HR neg, 03-20-18 LGSIL HPV HR neg  History of abnormal Pap or positive HPV:  yes Mammogram:   09/18/23 BI-RADS CATEGORY 1: Negative. ACR Breast Density Category b  Colonoscopy:  03/07/13 Bone Density:  08/22/21  Result  osteoporosis   Immunization History  Administered Date(s) Administered   Influenza,inj,quad, With Preservative 07/19/2018      reports that she has been smoking cigarettes. She has never used smokeless tobacco. She reports that she does not drink alcohol and does not use drugs.  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   Benign essential HTN 01/17/2012   Cancer (HCC) 1982   ovarian   Cancer of cervix (HCC)    Constipation    Depression    Takes Klonopin   Depression with anxiety 01/17/2012   GERD (gastroesophageal reflux disease)    Hypercholesteremia    Hypertension    takes meds   Hypothyroidism    on Levonthyroxine    Hypothyroidism (acquired) 01/17/2012   Since age 30   Low sodium levels 09/25/2022   Na level 125 at [facility]   Osteopenia 2018   forearm   Osteoporosis 07/25/2021   forearm   Ovarian cyst 01/17/2012   1995  BSO   Personal history of colonic polyps 11/07/2007   Pneumonia    x 2- last time > 5 years ago   Pneumonia 01/17/2012   10 yrs ago and 5 years ago    Rash and other nonspecific skin eruption 01/17/2012   Dorsum hands & extensor surfaces arms; intermittent    Past Surgical History:  Procedure Laterality Date   ABDOMINAL HYSTERECTOMY  1982   ovaries retained--for CIS   CELIAC ARTERY BYPASS  09/25/2012   Procedure: CELIAC ARTERY BYPASS;  Surgeon: Sherren Kerns, MD;  Location: [facility] OR;  Service: Vascular;  Laterality: N/A;  Bypass using Hemashield Gold Vascular Graft 12mm x 6mm x 40cm   COLONOSCOPY  2009   [place]-polyps, 5 yr FU   JOINT REPLACEMENT     MESENTERIC ARTERY BYPASS  09/25/2012   Procedure:  MESENTERIC ARTERY BYPASS;  Surgeon: Sherren Kerns, MD;  Location: [facility] OR;  Service: Vascular;  Laterality: N/A;  Bypass using Hemashield Gold Vascular Graft 12mm x 6mm x 40cm   OVARIAN CYST REMOVAL  1997   -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   TOTAL HIP ARTHROPLASTY  09/06/2011   Procedure: TOTAL HIP ARTHROPLASTY;  Surgeon: Loreta Ave, MD;  Location: [facility] OR;  Service: Orthopedics;  Laterality: Left;  TOTAL HIP ARTHROPLASTY LEFT SIDE   TOTAL HIP ARTHROPLASTY  10/11/2011   Procedure: TOTAL HIP ARTHROPLASTY;  Surgeon: Loreta Ave, MD;  Location: [facility] OR;  Service: Orthopedics;  Laterality: Right;  120 MINUTES FOR THIS SURGERY   WOUND EXPLORATION  10/02/2012   Procedure: WOUND EXPLORATION;  Surgeon: Sherren Kerns, MD;  Location: [facility] OR;  Service: Vascular;  Laterality: N/A;  abdominal wound exploration    Current Outpatient Medications  Medication Sig Dispense Refill   acetaZOLAMIDE ER (DIAMOX) 500 MG capsule Take 1 capsule by mouth 2  (two) times daily.     ARIPiprazole (ABILIFY) 10 MG tablet Take 5 mg by mouth daily. Takes 1/2 daily     aspirin EC 81 MG tablet Take 81 mg by mouth daily. Swallow whole.     atorvastatin (LIPITOR) 40 MG tablet Take 40 mg by mouth daily.     azelastine (ASTELIN) 0.1 % nasal spray Place 2 sprays into both nostrils 2 (two) times daily.     brimonidine (ALPHAGAN) 0.2 % ophthalmic solution Place 1 drop into the right eye 3 (three) times daily.     CALCIUM PO Take 3 tablets by mouth daily.     Cholecalciferol 50 MCG (2000 UT) TABS Take 1 tablet by mouth daily at 6 (six) AM.     clonazePAM (KLONOPIN) 0.5 MG tablet Take 1 mg by mouth 2 (two) times daily.     dorzolamide-timolol (COSOPT) 22.3-6.8 MG/ML ophthalmic solution Place 1 drop into the right eye 2 (two) times daily.     ipratropium-albuterol (DUONEB) 0.5-2.5 (3) MG/3ML SOLN Take 3 mLs by nebulization every 6 (six) hours as needed.     labetalol (NORMODYNE) 100 MG tablet Take 1 tablet (100 mg total) by mouth 2 (two) times daily. 60 tablet 1   latanoprost (XALATAN) 0.005 % ophthalmic solution Place 1 drop into the right eye at bedtime.     levocetirizine (XYZAL) 5 MG tablet Take 5 mg by mouth in the morning.     losartan (COZAAR) 25 MG tablet Take 1 tablet (25 mg total) by mouth daily. 30 tablet 2   Melatonin (MELATONIN EXTRA STRENGTH) 10 MG TABS Take 1 tablet by mouth at bedtime.     montelukast (SINGULAIR) 10 MG tablet Take 10 mg by mouth at bedtime.     Multiple Vitamin (MULTI-VITAMIN) tablet Take 2 tablets by mouth daily.     nicotine (NICODERM CQ - DOSED IN MG/24 HOURS) 21 mg/24hr patch Place 1 patch (21 mg total) onto the skin daily. 28 patch 2   pantoprazole (PROTONIX) 40 MG tablet Take 1 tablet (40 mg total) by mouth 2 (two) times daily. 60 tablet 1   prednisoLONE acetate (PRED FORTE) 1 % ophthalmic suspension Apply to eye.     TRELEGY ELLIPTA 100-62.5-25 MCG/ACT AEPB Inhale 1 puff into the lungs daily.     Zoledronic Acid (RECLAST IV)  Inject 5 mg into the vein once.     zolpidem (AMBIEN) 10 MG tablet Take 10 mg by mouth at bedtime as needed for  sleep.     albuterol (VENTOLIN HFA) 108 (90 Base) MCG/ACT inhaler Inhale 2 puffs into the lungs See admin instructions. Inhale 2 puffs every 4 to 6 hours as needed for shortness of breath 8 g 3   levothyroxine (SYNTHROID) 125 MCG tablet Take 1 tablet (125 mcg total) by mouth daily at 6 (six) AM. 30 tablet 1   No current facility-administered medications for this visit.    ALLERGIES: Sulfa antibiotics  Family History  Problem Relation Age of Onset   Cancer Mother        lung   Heart failure Father    Breast cancer Sister 61       dec age 2   Cancer Sister    Heart attack Brother 69   Heart attack Brother    Hypertension Son    Ovarian cancer Paternal Grandmother        dec age 44   Anesthesia problems Neg Hx    Hypotension Neg Hx    Malignant hyperthermia Neg Hx    Pseudochol deficiency Neg Hx    Colon cancer Neg Hx    Esophageal cancer Neg Hx    Rectal cancer Neg Hx    Stomach cancer Neg Hx     Review of Systems  All other systems reviewed and are negative.   PHYSICAL EXAM:  BP 122/82 (BP Location: Left Arm, Patient Position: Sitting, Cuff Size: Normal)   Pulse (!) 59   Ht 5\' 7"  (1.702 m)   Wt 142 lb (64.4 kg)   LMP 10/23/1980 Comment: Hyst age 63(ovaries retained)  SpO2 100%   BMI 22.24 kg/m     General appearance: alert, cooperative and appears stated age Head: normocephalic, without obvious abnormality, atraumatic Neck: no adenopathy, supple, symmetrical, trachea midline and thyroid normal to inspection and palpation Lungs: clear to auscultation bilaterally Breasts: normal appearance, no masses or tenderness, No nipple retraction or dimpling, No nipple discharge or bleeding, No axillary adenopathy Heart: regular rate and rhythm Abdomen: soft, non-tender; no masses, no organomegaly Extremities: extremities normal, atraumatic, no cyanosis or  edema Skin: skin color, texture, turgor normal. No rashes or lesions Lymph nodes: cervical, supraclavicular, and axillary nodes normal. Neurologic: grossly normal  Pelvic: External genitalia:  no lesions              No abnormal inguinal nodes palpated.              Urethra:  normal appearing urethra with no masses, tenderness or lesions              Bartholins and Skenes: normal                 Vagina: normal appearing vagina with normal color and discharge, no lesions              Cervix: absent.              Pap taken: Yes.   Bimanual Exam:  Uterus:  absent.              Adnexa: no mass, fullness, tenderness              Rectal exam: Yes.  .  Confirms.              Anus:  normal sphincter tone, no lesions  Chaperone was present for exam:  Warren Lacy, CMA  ASSESSMENT: Encounter for breast and pelvic exam. GYN exam for high risk Medicare patient.  Status post total vaginal hysterectomy for  CIS.   Hx VAIN III.  Status post Effudex.  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 FH of ovarian cancer in paternal grandmother and breast cancer in sister. Sister had genetic testing but patient is unaware of the results.    Bilateral carotid artery stenosis.  Hx mesenteric ischemia and bypass surgery. Hx retinal vein occlusion.  Smoker.    Osteoporosis.  Avascular necrosis of hips.   Hypothyroidism.    PLAN: Mammogram screening discussed. Self breast awareness reviewed. Pap and HRV collected:  yes Guidelines for Calcium, Vitamin D, regular exercise program including cardiovascular and weight bearing exercise. Medication refills:  Yearly Reclast. BMD in February, 2025.  Follow up:  1 year and prn.    Additional counseling given.  yes. 25 min  total time was spent for this patient encounter, including preparation, face-to-face counseling with the patient, coordination of care, and documentation of the  encounter in addition to doing the breast and pelvic exam and pap.

## 2023-09-17 ENCOUNTER — Telehealth: Payer: Self-pay | Admitting: *Deleted

## 2023-09-17 ENCOUNTER — Other Ambulatory Visit (HOSPITAL_COMMUNITY): Payer: Self-pay | Admitting: *Deleted

## 2023-09-17 NOTE — Telephone Encounter (Signed)
Call received from nytelia at [facility]. Patient is scheduled for Reclast Infusion on 09/18/23, order does not include recent labs, will need order to include labs.   Reclast order completed and printed for Dr. Edward Jolly to review and sign.   Routing to Dr. Marjorie Smolder.   Cc: Warren Lacy.

## 2023-09-18 ENCOUNTER — Ambulatory Visit (HOSPITAL_COMMUNITY)
Admission: RE | Admit: 2023-09-18 | Discharge: 2023-09-18 | Disposition: A | Discharge status: Home / Self Care | Payer: Medicare HMO | Source: Ambulatory Visit | Attending: Obstetrics and Gynecology | Admitting: Obstetrics and Gynecology

## 2023-09-18 ENCOUNTER — Ambulatory Visit
Admission: RE | Admit: 2023-09-18 | Discharge: 2023-09-18 | Disposition: A | Discharge status: Home / Self Care | Payer: Medicare HMO | Source: Ambulatory Visit | Attending: Nurse Practitioner | Admitting: Nurse Practitioner

## 2023-09-18 DIAGNOSIS — M810 Age-related osteoporosis without current pathological fracture: Secondary | ICD-10-CM | POA: Diagnosis present

## 2023-09-18 DIAGNOSIS — Z1231 Encounter for screening mammogram for malignant neoplasm of breast: Secondary | ICD-10-CM

## 2023-09-18 MED ORDER — ZOLEDRONIC ACID 5 MG/100ML IV SOLN
INTRAVENOUS | Status: AC
  Administered 2023-09-18: 5 mg via INTRAVENOUS
  Filled 2023-09-18: qty 100

## 2023-09-18 MED ORDER — ZOLEDRONIC ACID 5 MG/100ML IV SOLN: 5.0000 mg | Freq: Once | INTRAVENOUS | Status: AC

## 2023-09-18 NOTE — Telephone Encounter (Signed)
Reclast order was signed by me.

## 2023-09-19 NOTE — Telephone Encounter (Signed)
Per review of EPIC, reclast infusion received on 09/18/23.   Encounter closed.

## 2023-09-27 ENCOUNTER — Other Ambulatory Visit (HOSPITAL_COMMUNITY)
Admission: RE | Admit: 2023-09-27 | Discharge: 2023-09-27 | Disposition: A | Discharge status: Home / Self Care | Payer: Medicare HMO | Source: Ambulatory Visit | Attending: Obstetrics and Gynecology | Admitting: Obstetrics and Gynecology

## 2023-09-27 ENCOUNTER — Encounter: Payer: Self-pay | Admitting: Obstetrics and Gynecology

## 2023-09-27 ENCOUNTER — Ambulatory Visit: Payer: Medicare HMO | Admitting: Obstetrics and Gynecology

## 2023-09-27 VITALS — BP 122/82 | HR 59 | Ht 67.0 in | Wt 142.0 lb

## 2023-09-27 DIAGNOSIS — Z87411 Personal history of vaginal dysplasia: Secondary | ICD-10-CM | POA: Insufficient documentation

## 2023-09-27 DIAGNOSIS — Z1272 Encounter for screening for malignant neoplasm of vagina: Secondary | ICD-10-CM | POA: Diagnosis present

## 2023-09-27 DIAGNOSIS — Z01419 Encounter for gynecological examination (general) (routine) without abnormal findings: Secondary | ICD-10-CM

## 2023-09-27 DIAGNOSIS — M810 Age-related osteoporosis without current pathological fracture: Secondary | ICD-10-CM

## 2023-09-27 DIAGNOSIS — Z1151 Encounter for screening for human papillomavirus (HPV): Secondary | ICD-10-CM | POA: Diagnosis not present

## 2023-09-27 DIAGNOSIS — Z9189 Other specified personal risk factors, not elsewhere classified: Secondary | ICD-10-CM | POA: Diagnosis not present

## 2023-09-27 NOTE — Patient Instructions (Addendum)
Calcium Content in Foods Calcium is the most abundant mineral in the body. Most of the body's calcium supply is stored in bones and teeth. Calcium helps many parts of the body function normally, including: Blood and blood vessels. Nerves. Hormones. Muscles. Bones and teeth. When your calcium stores are low, you may be at risk for low bone mass, bone loss, and broken bones (fractures). When you get enough calcium, it helps to support strong bones and teeth throughout your life. Calcium is especially important for: Children during growth spurts. Girls during adolescence. Women who are pregnant or breastfeeding. Women after their menstrual cycle stops (postmenopause). Women whose menstrual cycle has stopped due to anorexia nervosa or regular intense exercise. People who cannot eat or digest dairy products. Vegans. Recommended daily amounts of calcium: Women (ages 56 to 27): 1,000 mg per day. Women (ages 62 and older): 1,200 mg per day. Men (ages 21 to 67): 1,000 mg per day. Men (ages 40 and older): 1,200 mg per day. Women (ages 76 to 97): 1,300 mg per day. Men (ages 60 to 61): 1,300 mg per day. General information Eat foods that are high in calcium. Try to get most of your calcium from food. Some people may benefit from taking calcium supplements. Check with your health care provider or diet and nutrition specialist (dietitian) before starting any calcium supplements. Calcium supplements may interact with certain medicines. Too much calcium may cause other health problems, such as constipation and kidney stones. For the body to absorb calcium, it needs vitamin D. Sources of vitamin D include: Skin exposure to direct sunlight. Foods, such as egg yolks, liver, mushrooms, saltwater fish, and fortified milk. Vitamin D supplements. Check with your health care provider or dietitian before starting any vitamin D supplements. What foods are high in calcium?  Foods that are high in calcium contain  more than 100 milligrams per serving. Fruits Fortified orange juice or other fruit juice, 300 mg per 8 oz serving. Vegetables Collard greens, 360 mg per 8 oz serving. Kale, 100 mg per 8 oz serving. Bok choy, 160 mg per 8 oz serving. Grains Fortified ready-to-eat cereals, 100 to 1,000 mg per 8 oz serving. Fortified frozen waffles, 200 mg in 2 waffles. Oatmeal, 140 mg in 1 cup. Meats and other proteins Sardines, canned with bones, 325 mg per 3 oz serving. Salmon, canned with bones, 180 mg per 3 oz serving. Canned shrimp, 125 mg per 3 oz serving. Baked beans, 160 mg per 4 oz serving. Tofu, firm, made with calcium sulfate, 253 mg per 4 oz serving. Dairy Yogurt, plain, low-fat, 310 mg per 6 oz serving. Nonfat milk, 300 mg per 8 oz serving. American cheese, 195 mg per 1 oz serving. Cheddar cheese, 205 mg per 1 oz serving. Cottage cheese 2%, 105 mg per 4 oz serving. Fortified soy, rice, or almond milk, 300 mg per 8 oz serving. Mozzarella, part skim, 210 mg per 1 oz serving. The items listed above may not be a complete list of foods high in calcium. Actual amounts of calcium may be different depending on processing. Contact a dietitian for more information. What foods are lower in calcium? Foods that are lower in calcium contain 50 mg or less per serving. Fruits Apple, about 6 mg. Banana, about 12 mg. Vegetables Lettuce, 19 mg per 2 oz serving. Tomato, about 11 mg. Grains Rice, 4 mg per 6 oz serving. Boiled potatoes, 14 mg per 8 oz serving. White bread, 6 mg per slice. Meats and other proteins  Egg, 27 mg per 2 oz serving. Red meat, 7 mg per 4 oz serving. Chicken, 17 mg per 4 oz serving. Fish, cod, or trout, 20 mg per 4 oz serving. Dairy Cream cheese, regular, 14 mg per 1 Tbsp serving. Brie cheese, 50 mg per 1 oz serving. Parmesan cheese, 70 mg per 1 Tbsp serving. The items listed above may not be a complete list of foods lower in calcium. Actual amounts of calcium may be  different depending on processing. Contact a dietitian for more information. Summary Calcium is an important mineral in the body because it affects many functions. Getting enough calcium helps support strong bones and teeth throughout your life. Try to get most of your calcium from food. Calcium supplements may interact with certain medicines. Check with your health care provider or dietitian before starting any calcium supplements. This information is not intended to replace advice given to you by your health care provider. Make sure you discuss any questions you have with your health care provider. Document Revised: 02/04/2020 Document Reviewed: 02/04/2020 Elsevier Patient Education  2024 [employer] Inc.   EXERCISE AND DIET:  We recommended that you start or continue a regular exercise program for good health. Regular exercise means any activity that makes your heart beat faster and makes you sweat.  We recommend exercising at least 30 minutes per day at least 3 days a week, preferably 4 or 5.  We also recommend a diet low in fat and sugar.  Inactivity, poor dietary choices and obesity can cause diabetes, heart attack, stroke, and kidney damage, among others.    ALCOHOL AND SMOKING:  Women should limit their alcohol intake to no more than 7 drinks/beers/glasses of wine (combined, not each!) per week. Moderation of alcohol intake to this level decreases your risk of breast cancer and liver damage. And of course, no recreational drugs are part of a healthy lifestyle.  And absolutely no smoking or even second hand smoke. Most people know smoking can cause heart and lung diseases, but did you know it also contributes to weakening of your bones? Aging of your skin?  Yellowing of your teeth and nails?  CALCIUM AND VITAMIN D:  Adequate intake of calcium and Vitamin D are recommended.  The recommendations for exact amounts of these supplements seem to change often, but generally speaking 600 mg of calcium  (either carbonate or citrate) and 800 units of Vitamin D per day seems prudent. Certain women may benefit from higher intake of Vitamin D.  If you are among these women, your doctor will have told you during your visit.    PAP SMEARS:  Pap smears, to check for cervical cancer or precancers,  have traditionally been done yearly, although recent scientific advances have shown that most women can have pap smears less often.  However, every woman still should have a physical exam from her gynecologist every year. It will include a breast check, inspection of the vulva and vagina to check for abnormal growths or skin changes, a visual exam of the cervix, and then an exam to evaluate the size and shape of the uterus and ovaries.  And after 66 years of age, a rectal exam is indicated to check for rectal cancers. We will also provide age appropriate advice regarding health maintenance, like when you should have certain vaccines, screening for sexually transmitted diseases, bone density testing, colonoscopy, mammograms, etc.   MAMMOGRAMS:  All women over 63 years old should have a yearly mammogram. Many facilities now offer  a "3D" mammogram, which may cost around $50 extra out of pocket. If possible,  we recommend you accept the option to have the 3D mammogram performed.  It both reduces the number of women who will be called back for extra views which then turn out to be normal, and it is better than the routine mammogram at detecting truly abnormal areas.    COLONOSCOPY:  Colonoscopy to screen for colon cancer is recommended for all women at age 78.  We know, you hate the idea of the prep.  We agree, BUT, having colon cancer and not knowing it is worse!!  Colon cancer so often starts as a polyp that can be seen and removed at colonscopy, which can quite literally save your life!  And if your first colonoscopy is normal and you have no family history of colon cancer, most women don't have to have it again for 10  years.  Once every ten years, you can do something that may end up saving your life, right?  We will be happy to help you get it scheduled when you are ready.  Be sure to check your insurance coverage so you understand how much it will cost.  It may be covered as a preventative service at no cost, but you should check your particular policy.

## 2023-10-01 LAB — CYTOLOGY - PAP
Comment: NEGATIVE
Diagnosis: NEGATIVE
High risk HPV: NEGATIVE

## 2023-10-05 ENCOUNTER — Encounter: Payer: Self-pay | Admitting: Obstetrics and Gynecology

## 2023-12-04 ENCOUNTER — Encounter (INDEPENDENT_AMBULATORY_CARE_PROVIDER_SITE_OTHER): Payer: Self-pay | Admitting: *Deleted

## 2023-12-18 ENCOUNTER — Telehealth (INDEPENDENT_AMBULATORY_CARE_PROVIDER_SITE_OTHER): Payer: Self-pay | Admitting: Gastroenterology

## 2023-12-18 ENCOUNTER — Other Ambulatory Visit: Payer: Medicare HMO

## 2023-12-18 NOTE — Telephone Encounter (Signed)
 Ok to schedule.  Room 1/2  Thanks,  Vista Lawman, MD Gastroenterology and Hepatology [facility] Gastroenterology

## 2023-12-18 NOTE — Telephone Encounter (Signed)
 Who is your primary care physician: Cheron Every  Reasons for the colonoscopy:   Have you had a colonoscopy before?  Yes 13 years  Do you have family history of colon cancer? no  Previous colonoscopy with polyps removed? 13 years  Do you have a history colorectal cancer?   no  Are you diabetic? If yes, Type 1 or Type 2?    no  Do you have a prosthetic or mechanical heart valve? no  Do you have a pacemaker/defibrillator?   no  Have you had endocarditis/atrial fibrillation? no  Have you had joint replacement within the last 12 months?  yes  Do you tend to be constipated or have to use laxatives? yes  Do you have any history of drugs or alchohol?  no  Do you use supplemental oxygen?  yes only at night  Have you had a stroke or heart attack within the last 6 months? no  Do you take weight loss medication?  no  For female patients: have you had a hysterectomy?  yes                                    are you post menopausal?      no                                           do you still have your menstrual cycle? no      Do you take any blood-thinning medications such as: (aspirin, warfarin, Plavix, Aggrenox)  aspirin  If yes we need the name, milligram, dosage and who is prescribing doctor aspirin  Current Outpatient Medications on File Prior to Visit  Medication Sig Dispense Refill   acetaZOLAMIDE ER (DIAMOX) 500 MG capsule Take 1 capsule by mouth 2 (two) times daily.     ARIPiprazole (ABILIFY) 10 MG tablet Take 5 mg by mouth daily. Takes 1/2 daily     aspirin EC 81 MG tablet Take 81 mg by mouth daily. Swallow whole.     atorvastatin (LIPITOR) 40 MG tablet Take 40 mg by mouth daily.     CALCIUM PO Take 3 tablets by mouth daily.     Cholecalciferol 50 MCG (2000 UT) TABS Take 1 tablet by mouth daily at 6 (six) AM.     clonazePAM (KLONOPIN) 0.5 MG tablet Take 1 mg by mouth 2 (two) times daily.     labetalol (NORMODYNE) 100 MG tablet Take 1 tablet (100 mg total) by mouth 2  (two) times daily. 60 tablet 1   latanoprost (XALATAN) 0.005 % ophthalmic solution Place 1 drop into the right eye at bedtime.     levocetirizine (XYZAL) 5 MG tablet Take 5 mg by mouth in the morning.     losartan (COZAAR) 25 MG tablet Take 1 tablet (25 mg total) by mouth daily. 30 tablet 2   montelukast (SINGULAIR) 10 MG tablet Take 10 mg by mouth at bedtime.     Multiple Vitamin (MULTI-VITAMIN) tablet Take 2 tablets by mouth daily.     pantoprazole (PROTONIX) 40 MG tablet Take 1 tablet (40 mg total) by mouth 2 (two) times daily. 60 tablet 1   TRELEGY ELLIPTA 100-62.5-25 MCG/ACT AEPB Inhale 1 puff into the lungs daily.     zolpidem (AMBIEN) 10 MG tablet Take 10 mg by mouth  at bedtime as needed for sleep.     albuterol (VENTOLIN HFA) 108 (90 Base) MCG/ACT inhaler Inhale 2 puffs into the lungs See admin instructions. Inhale 2 puffs every 4 to 6 hours as needed for shortness of breath 8 g 3   azelastine (ASTELIN) 0.1 % nasal spray Place 2 sprays into both nostrils 2 (two) times daily. (Patient not taking: Reported on 12/18/2023)     brimonidine (ALPHAGAN) 0.2 % ophthalmic solution Place 1 drop into the right eye 3 (three) times daily. (Patient not taking: Reported on 12/18/2023)     dorzolamide-timolol (COSOPT) 22.3-6.8 MG/ML ophthalmic solution Place 1 drop into the right eye 2 (two) times daily. (Patient not taking: Reported on 12/18/2023)     ipratropium-albuterol (DUONEB) 0.5-2.5 (3) MG/3ML SOLN Take 3 mLs by nebulization every 6 (six) hours as needed. (Patient not taking: Reported on 12/18/2023)     levothyroxine (SYNTHROID) 125 MCG tablet Take 1 tablet (125 mcg total) by mouth daily at 6 (six) AM. 30 tablet 1   Melatonin (MELATONIN EXTRA STRENGTH) 10 MG TABS Take 1 tablet by mouth at bedtime. (Patient not taking: Reported on 12/18/2023)     nicotine (NICODERM CQ - DOSED IN MG/24 HOURS) 21 mg/24hr patch Place 1 patch (21 mg total) onto the skin daily. (Patient not taking: Reported on 12/18/2023) 28  patch 2   Zoledronic Acid (RECLAST IV) Inject 5 mg into the vein once. (Patient not taking: Reported on 12/18/2023)     No current facility-administered medications on file prior to visit.    Allergies  Allergen Reactions   Sulfa Antibiotics Rash     Pharmacy:   Primary Insurance Name: [employer]/Medicare  Best number where you can be reached: [phone]

## 2023-12-19 ENCOUNTER — Ambulatory Visit
Admission: RE | Admit: 2023-12-19 | Discharge: 2023-12-19 | Disposition: A | Discharge status: Home / Self Care | Payer: Medicare HMO | Source: Ambulatory Visit | Attending: Obstetrics and Gynecology | Admitting: Obstetrics and Gynecology

## 2023-12-19 ENCOUNTER — Encounter (INDEPENDENT_AMBULATORY_CARE_PROVIDER_SITE_OTHER): Payer: Self-pay | Admitting: *Deleted

## 2023-12-19 ENCOUNTER — Telehealth (INDEPENDENT_AMBULATORY_CARE_PROVIDER_SITE_OTHER): Payer: Self-pay | Admitting: Gastroenterology

## 2023-12-19 DIAGNOSIS — M810 Age-related osteoporosis without current pathological fracture: Secondary | ICD-10-CM

## 2023-12-19 MED ORDER — PEG 3350-KCL-NA BICARB-NACL 420 G PO SOLR: 4000.0000 mL | Freq: Once | ORAL | 0 refills | Status: AC

## 2023-12-19 NOTE — Telephone Encounter (Signed)
 Pt contacted and scheduled. Prep sent to pharmacy. Instructions mailed to pt. No pa needed per insurance.

## 2023-12-19 NOTE — Addendum Note (Signed)
 Addended by: Marlowe Shores on: 12/19/2023 09:33 AM   Modules accepted: Orders

## 2023-12-19 NOTE — Telephone Encounter (Signed)
referr

## 2023-12-19 NOTE — Telephone Encounter (Signed)
 Pt left message needing to reschedule TCS due to having dr appt on same day as TCS scheduled. Returned call to patient and pt was rescheduled to 01/07/24 at 1:45pm. New instructions mailed.

## 2023-12-26 ENCOUNTER — Encounter: Payer: Self-pay | Admitting: Obstetrics and Gynecology

## 2024-01-07 ENCOUNTER — Encounter (HOSPITAL_COMMUNITY)
Admission: RE | Disposition: A | Discharge status: Home / Self Care | Payer: Self-pay | Source: Home / Self Care | Attending: Gastroenterology

## 2024-01-07 ENCOUNTER — Ambulatory Visit (HOSPITAL_COMMUNITY)
Admission: RE | Admit: 2024-01-07 | Discharge: 2024-01-07 | Disposition: A | Discharge status: Home / Self Care | Payer: Medicare HMO | Attending: Gastroenterology | Admitting: Gastroenterology

## 2024-01-07 ENCOUNTER — Ambulatory Visit (HOSPITAL_COMMUNITY): Admitting: Anesthesiology

## 2024-01-07 ENCOUNTER — Other Ambulatory Visit: Payer: Self-pay

## 2024-01-07 ENCOUNTER — Encounter (HOSPITAL_COMMUNITY): Payer: Self-pay | Admitting: Gastroenterology

## 2024-01-07 DIAGNOSIS — Z1211 Encounter for screening for malignant neoplasm of colon: Secondary | ICD-10-CM

## 2024-01-07 DIAGNOSIS — K621 Rectal polyp: Secondary | ICD-10-CM

## 2024-01-07 DIAGNOSIS — J449 Chronic obstructive pulmonary disease, unspecified: Secondary | ICD-10-CM

## 2024-01-07 DIAGNOSIS — K648 Other hemorrhoids: Secondary | ICD-10-CM | POA: Insufficient documentation

## 2024-01-07 DIAGNOSIS — D128 Benign neoplasm of rectum: Secondary | ICD-10-CM | POA: Diagnosis not present

## 2024-01-07 DIAGNOSIS — D124 Benign neoplasm of descending colon: Secondary | ICD-10-CM | POA: Diagnosis not present

## 2024-01-07 DIAGNOSIS — D125 Benign neoplasm of sigmoid colon: Secondary | ICD-10-CM | POA: Insufficient documentation

## 2024-01-07 HISTORY — PX: POLYPECTOMY: SHX5525

## 2024-01-07 HISTORY — PX: COLONOSCOPY WITH PROPOFOL: SHX5780

## 2024-01-07 SURGERY — COLONOSCOPY WITH PROPOFOL
Anesthesia: General

## 2024-01-07 MED ORDER — LACTATED RINGERS IV SOLN
INTRAVENOUS | Status: DC | PRN
Start: 2024-01-07 — End: 2024-01-07

## 2024-01-07 MED ORDER — SODIUM CHLORIDE 0.9% FLUSH: 3.0000 mL | Freq: Two times a day (BID) | INTRAVENOUS | Status: DC

## 2024-01-07 MED ORDER — GLYCOPYRROLATE PF 0.2 MG/ML IJ SOSY
PREFILLED_SYRINGE | INTRAMUSCULAR | Status: DC | PRN
  Administered 2024-01-07: .2 mg via INTRAVENOUS

## 2024-01-07 MED ORDER — SODIUM CHLORIDE 0.9% FLUSH: 3.0000 mL | INTRAVENOUS | Status: DC | PRN

## 2024-01-07 MED ORDER — LIDOCAINE HCL (CARDIAC) PF 100 MG/5ML IV SOSY
PREFILLED_SYRINGE | INTRAVENOUS | Status: DC | PRN
  Administered 2024-01-07: 60 mg via INTRATRACHEAL

## 2024-01-07 MED ORDER — PROPOFOL 10 MG/ML IV BOLUS
INTRAVENOUS | Status: DC | PRN
  Administered 2024-01-07: 60 mg via INTRAVENOUS
  Administered 2024-01-07: 150 ug/kg/min via INTRAVENOUS

## 2024-01-07 NOTE — Op Note (Signed)
 [facility] Patient Name: Dorothy [name] Procedure Date: 01/07/2024 10:05 AM MRN: [id number] Date of Birth: [birth year] Attending MD: Sanjuan Dame , MD, [id number] CSN: [id number] Age: 68 Admit Type: Outpatient Procedure:                Colonoscopy Indications:              Screening for colorectal malignant neoplasm Providers:                Sanjuan Dame, MD, Edrick Kins, RN, Dyann Ruddle Referring MD:              Medicines:                Monitored Anesthesia Care Complications:            No immediate complications. Estimated Blood Loss:     Estimated blood loss was minimal. Procedure:                Pre-Anesthesia Assessment:                           - Prior to the procedure, a History and Physical                            was performed, and patient medications and                            allergies were reviewed. The patient's tolerance of                            previous anesthesia was also reviewed. The risks                            and benefits of the procedure and the sedation                            options and risks were discussed with the patient.                            All questions were answered, and informed consent                            was obtained. Prior Anticoagulants: The patient has                            taken no anticoagulant or antiplatelet agents. ASA                            Grade Assessment: III - A patient with severe                            systemic disease. After reviewing the risks and                            benefits, the patient was deemed in satisfactory  condition to undergo the procedure.                           After obtaining informed consent, the colonoscope                            was passed under direct vision. Throughout the                            procedure, the patient's blood pressure, pulse, and                            oxygen saturations were monitored  continuously. The                            [id number]-[id number] ([id number]) scope was introduced through                            the anus and advanced to the the cecum, identified                            by appendiceal orifice and ileocecal valve. The                            colonoscopy was performed without difficulty. The                            patient tolerated the procedure well. The quality                            of the bowel preparation was evaluated using the                            BBPS [facility] Bowel Preparation Scale) with scores                            of: Right Colon = 3, Transverse Colon = 3 and Left                            Colon = 3 (entire mucosa seen well with no residual                            staining, small fragments of stool or opaque                            liquid). The total BBPS score equals 9. The                            ileocecal valve, appendiceal orifice, and rectum                            were photographed. Scope In: 1:03:51 PM Scope Out: 1:32:29 PM Scope Withdrawal Time: 0 hours 23 minutes 39  seconds  Total Procedure Duration: 0 hours 28 minutes 38 seconds  Findings:      The perianal and digital rectal examinations were normal.      Five sessile polyps were found in the rectum, sigmoid colon and       descending colon. The polyps were 3 to 6 mm in size. These polyps were       removed with a cold snare. Resection and retrieval were complete.      Non-bleeding internal hemorrhoids were found during retroflexion. The       hemorrhoids were small. Impression:               - Five 3 to 6 mm polyps in the rectum, in the                            sigmoid colon and in the descending colon, removed                            with a cold snare. Resected and retrieved.                           - Non-bleeding internal hemorrhoids. Moderate Sedation:      Per Anesthesia Care Recommendation:           - Patient has a contact number  available for                            emergencies. The signs and symptoms of potential                            delayed complications were discussed with the                            patient. Return to normal activities tomorrow.                            Written discharge instructions were provided to the                            patient.                           - High fiber diet.                           - Continue present medications.                           - Await pathology results.                           - Repeat colonoscopy in 5-10 years for surveillance                            based on pathology results.                           -  Return to primary care physician as previously                            scheduled. Procedure Code(s):        --- Professional ---                           [phone], Colonoscopy, flexible; with removal of                            tumor(s), polyp(s), or other lesion(s) by snare                            technique Diagnosis Code(s):        --- Professional ---                           Z12.11, Encounter for screening for malignant                            neoplasm of colon                           D12.8, Benign neoplasm of rectum                           D12.5, Benign neoplasm of sigmoid colon                           D12.4, Benign neoplasm of descending colon                           K64.8, Other hemorrhoids CPT copyright 2022 American Medical Association. All rights reserved. The codes documented in this report are preliminary and upon coder review may  be revised to meet current compliance requirements. Sanjuan Dame, MD Sanjuan Dame, MD 01/07/2024 1:36:48 PM This report has been signed electronically. Number of Addenda: 0

## 2024-01-07 NOTE — Anesthesia Postprocedure Evaluation (Signed)
 Anesthesia Post Note  Patient: Dorothy [name]  Procedure(s) Performed: COLONOSCOPY WITH PROPOFOL POLYPECTOMY  Patient location during evaluation: PACU Anesthesia Type: General Level of consciousness: awake and alert Pain management: pain level controlled Vital Signs Assessment: post-procedure vital signs reviewed and stable Respiratory status: spontaneous breathing, nonlabored ventilation, respiratory function stable and patient connected to nasal cannula oxygen Cardiovascular status: blood pressure returned to baseline and stable Postop Assessment: no apparent nausea or vomiting Anesthetic complications: no   There were no known notable events for this encounter.   Last Vitals:  Vitals:   01/07/24 1337 01/07/24 1342  BP: (!) 100/41 (!) 144/62  Pulse: 68 72  Resp: 16 16  Temp: 36.5 C   SpO2: 100% 100%    Last Pain:  Vitals:   01/07/24 1342  TempSrc:   PainSc: 0-No pain                 Ima Hafner L Robben Jagiello

## 2024-01-07 NOTE — Anesthesia Preprocedure Evaluation (Addendum)
 Anesthesia Evaluation  Patient identified by MRN, date of birth, ID band Patient awake    Reviewed: Allergy & Precautions, H&P , NPO status , Patient's Chart, lab work & pertinent test results  History of Anesthesia Complications Negative for: history of anesthetic complications  Airway Mallampati: II  TM Distance: >3 FB Neck ROM: Full    Dental no notable dental hx. (+) Teeth Intact, Dental Advisory Given   Pulmonary pneumonia, resolved, COPD, Current Smoker History acute respiratory failure with hypoxia and bronchiectasis   Pulmonary exam normal        Cardiovascular hypertension, Pt. on medications + Peripheral Vascular Disease  Normal cardiovascular exam Rhythm:Regular Rate:Normal  EF=58% by EST   Neuro/Psych  PSYCHIATRIC DISORDERS Anxiety Depression    glaucoma  Neuromuscular disease    GI/Hepatic Neg liver ROS,GERD  ,,  Endo/Other  Hypothyroidism    Renal/GU      Musculoskeletal  (+) Arthritis , Osteoarthritis,    Abdominal   Peds  Hematology negative hematology ROS (+)   Anesthesia Other Findings Cervical cancer  Reproductive/Obstetrics                             Anesthesia Physical Anesthesia Plan  ASA: 3  Anesthesia Plan: General   Post-op Pain Management: Minimal or no pain anticipated   Induction: Intravenous  PONV Risk Score and Plan: Propofol infusion  Airway Management Planned: Nasal Cannula and Natural Airway  Additional Equipment: None  Intra-op Plan:   Post-operative Plan:   Informed Consent: I have reviewed the patients History and Physical, chart, labs and discussed the procedure including the risks, benefits and alternatives for the proposed anesthesia with the patient or authorized representative who has indicated his/her understanding and acceptance.     Dental advisory given  Plan Discussed with: CRNA  Anesthesia Plan Comments:          Anesthesia Quick Evaluation

## 2024-01-07 NOTE — Discharge Instructions (Signed)

## 2024-01-07 NOTE — Transfer of Care (Signed)
 Immediate Anesthesia Transfer of Care Note  Patient: Dorothy [name]  Procedure(s) Performed: COLONOSCOPY WITH PROPOFOL POLYPECTOMY  Patient Location: Endoscopy Unit  Anesthesia Type:General  Level of Consciousness: awake, alert , and oriented  Airway & Oxygen Therapy: Patient Spontanous Breathing  Post-op Assessment: Report given to RN and Post -op Vital signs reviewed and stable  Post vital signs: Reviewed and stable  Last Vitals:  Vitals Value Taken Time  BP 100/41 01/07/24 1337  Temp 36.5 C 01/07/24 1337  Pulse 68 01/07/24 1337  Resp 16 01/07/24 1337  SpO2 100 % 01/07/24 1337    Last Pain:  Vitals:   01/07/24 1337  TempSrc: Oral  PainSc: 0-No pain      Patients Stated Pain Goal: 8 (01/07/24 1229)  Complications: No notable events documented.

## 2024-01-07 NOTE — H&P (Signed)
 Primary Care Physician:  Erasmo Downer, NP Primary Gastroenterologist:  Dr. Tasia Catchings  Pre-Procedure History & Physical: HPI:  Dorothy [name] is a 68 y.o. female is here for a colonoscopy for colon cancer screening purposes.  Patient denies any family history of colorectal cancer.  No melena or hematochezia.  No abdominal pain or unintentional weight loss.  No change in bowel habits.  Overall feels well from a GI standpoint. Patient has history of Ex-lap for what appears to be SBO , also appears to have significant vasculopathy   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   Benign essential HTN 01/17/2012   Cancer (HCC) 1982   ovarian   Cancer of cervix (HCC)    Constipation    Depression    Takes Klonopin   Depression with anxiety 01/17/2012   GERD (gastroesophageal reflux disease)    Hypercholesteremia    Hypertension    takes meds   Hypothyroidism    on Levonthyroxine   Hypothyroidism (acquired) 01/17/2012   Since age 6   Low sodium levels 09/25/2022   Na level 125 at [facility]   Osteopenia 2018   forearm   Osteoporosis 2025   spine and forearm   Ovarian cyst 01/17/2012   1995  BSO   Personal history of colonic polyps 11/07/2007   Pneumonia    x 2- last time > 5 years ago   Pneumonia 01/17/2012   10 yrs ago and 5 years ago    Rash and other nonspecific skin eruption 01/17/2012   Dorsum hands & extensor surfaces arms; intermittent   VAIN III (vaginal intraepithelial neoplasia grade III) 2015   Tx with Effudex    Past Surgical History:  Procedure Laterality Date   ABDOMINAL HYSTERECTOMY  10/23/1980   ovaries retained--for CIS   CATARACT EXTRACTION Bilateral    CELIAC ARTERY BYPASS  09/25/2012   Procedure: CELIAC ARTERY BYPASS;  Surgeon: Sherren Kerns, MD;  Location: [facility] OR;  Service: Vascular;  Laterality: N/A;  Bypass using Hemashield Gold  Vascular Graft 12mm x 6mm x 40cm   COLONOSCOPY  10/24/2007   [place]-polyps, 5 yr FU   JOINT REPLACEMENT     MESENTERIC ARTERY BYPASS  09/25/2012   Procedure: MESENTERIC ARTERY BYPASS;  Surgeon: Sherren Kerns, MD;  Location: [facility] OR;  Service: Vascular;  Laterality: N/A;  Bypass using Hemashield Gold Vascular Graft 12mm x 6mm x 40cm   OVARIAN CYST REMOVAL  10/24/1995   -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 OR;  Service: Orthopedics;  Laterality: Right;  120 MINUTES FOR THIS SURGERY   WOUND EXPLORATION  10/02/2012   Procedure: WOUND EXPLORATION;  Surgeon: Sherren Kerns, MD;  Location: [facility] OR;  Service: Vascular;  Laterality: N/A;  abdominal wound exploration    Prior to Admission medications   Medication Sig Start Date End Date Taking? Authorizing Provider  acetaZOLAMIDE ER (DIAMOX) 500 MG capsule Take 1 capsule by mouth 2 (two) times daily. 03/20/23  Yes [provider]  ARIPiprazole (ABILIFY) 10  MG tablet Take 5 mg by mouth daily. Takes 1/2 daily 02/16/17  Yes [provider]  aspirin EC 81 MG tablet Take 81 mg by mouth daily. Swallow whole.   Yes [provider]  atorvastatin (LIPITOR) 40 MG tablet Take 40 mg by mouth daily. 06/19/22  Yes [provider]  brimonidine (ALPHAGAN) 0.2 % ophthalmic solution Place 1 drop into the right eye 3 (three) times daily.   Yes [provider]  CALCIUM PO Take 2 tablets by mouth daily.   Yes [provider]  Cholecalciferol 50 MCG (2000 UT) TABS Take 1 tablet by mouth daily at 6 (six) AM.   Yes [provider]  clonazePAM (KLONOPIN) 0.5 MG tablet Take 1 mg by mouth 2  (two) times daily.   Yes [provider]  dorzolamide-timolol (COSOPT) 22.3-6.8 MG/ML ophthalmic solution Place 1 drop into the right eye 2 (two) times daily.   Yes [provider]  labetalol (NORMODYNE) 100 MG tablet Take 1 tablet (100 mg total) by mouth 2 (two) times daily. Patient taking differently: Take 100 mg by mouth 2 (two) times daily. 1/2 tab BID 04/12/23  Yes Vassie Loll, MD  levocetirizine (XYZAL) 5 MG tablet Take 5 mg by mouth in the morning.   Yes [provider]  losartan (COZAAR) 25 MG tablet Take 1 tablet (25 mg total) by mouth daily. 04/12/23  Yes Vassie Loll, MD  montelukast (SINGULAIR) 10 MG tablet Take 10 mg by mouth at bedtime. 01/03/23  Yes [provider]  Multiple Vitamin (MULTI-VITAMIN) tablet Take 2 tablets by mouth daily.   Yes [provider]  nicotine (NICODERM CQ - DOSED IN MG/24 HOURS) 21 mg/24hr patch Place 1 patch (21 mg total) onto the skin daily. 04/12/23  Yes Vassie Loll, MD  pantoprazole (PROTONIX) 40 MG tablet Take 1 tablet (40 mg total) by mouth 2 (two) times daily. Patient taking differently: Take 40 mg by mouth daily. 04/12/23  Yes Vassie Loll, MD  TRELEGY ELLIPTA 100-62.5-25 MCG/ACT AEPB Inhale 1 puff into the lungs daily.   Yes [provider]  zolpidem (AMBIEN) 10 MG tablet Take 10 mg by mouth at bedtime as needed for sleep. 02/04/19  Yes [provider]  albuterol (VENTOLIN HFA) 108 (90 Base) MCG/ACT inhaler Inhale 2 puffs into the lungs See admin instructions. Inhale 2 puffs every 4 to 6 hours as needed for shortness of breath 03/19/23 04/18/23  Shahmehdi, Gemma Payor, MD  azelastine (ASTELIN) 0.1 % nasal spray Place 2 sprays into both nostrils 2 (two) times daily. Patient not taking: Reported on 12/18/2023 02/07/23   [provider]  ipratropium-albuterol (DUONEB) 0.5-2.5 (3) MG/3ML SOLN Take 3 mLs by nebulization every 6 (six) hours as needed. Patient not taking: Reported on  12/18/2023 04/03/23   [provider]  latanoprost (XALATAN) 0.005 % ophthalmic solution Place 1 drop into the right eye at bedtime. 03/20/23 03/19/24  [provider]  levothyroxine (SYNTHROID) 125 MCG tablet Take 1 tablet (125 mcg total) by mouth daily at 6 (six) AM. 03/20/23 05/19/23  Shahmehdi, Gemma Payor, MD  Melatonin (MELATONIN EXTRA STRENGTH) 10 MG TABS Take 1 tablet by mouth at bedtime. Patient not taking: Reported on 12/18/2023    [provider]  Zoledronic Acid (RECLAST IV) Inject 5 mg into the vein once. Patient not taking: Reported on 12/18/2023    [provider]    Allergies as of 12/19/2023 - Review Complete 12/18/2023  Allergen Reaction Noted   Sulfa antibiotics Rash  09/06/2011    Family History  Problem Relation Age of Onset   Cancer Mother        lung   Heart failure Father    Breast cancer Sister 60       dec age 33   Cancer Sister    Heart attack Brother 33   Heart attack Brother    Hypertension Son    Ovarian cancer Paternal Grandmother        dec age 52   Anesthesia problems Neg Hx    Hypotension Neg Hx    Malignant hyperthermia Neg Hx    Pseudochol deficiency Neg Hx    Colon cancer Neg Hx    Esophageal cancer Neg Hx    Rectal cancer Neg Hx    Stomach cancer Neg Hx     Social History   Socioeconomic History   Marital status: Widowed    Spouse name: Not on file   Number of children: 1   Years of education: Not on file   Highest education level: Not on file  Occupational History   Occupation: disabled  Tobacco Use   Smoking status: Former    Types: Cigarettes    Start date: 2025   Smokeless tobacco: Never  Vaping Use   Vaping status: Never Used  Substance and Sexual Activity   Alcohol use: No    Alcohol/week: 0.0 standard drinks of alcohol   Drug use: No   Sexual activity: Not Currently    Partners: Male    Birth control/protection: Surgical    Comment: TVH/ovaries retained, less than 5, IC before 16, no STD,  no abnormal pap, no DES  Other Topics Concern   Not on file  Social History Narrative   Not on file   Social Drivers of Health   Financial Resource Strain: Not on file  Food Insecurity: No Food Insecurity (04/10/2023)   Hunger Vital Sign    Worried About Running Out of Food in the Last Year: Never true    Ran Out of Food in the Last Year: Never true  Transportation Needs: No Transportation Needs (04/10/2023)   PRAPARE - Administrator, Civil Service (Medical): No    Lack of Transportation (Non-Medical): No  Physical Activity: Not on file  Stress: Not on file  Social Connections: Not on file  Intimate Partner Violence: Not At Risk (04/10/2023)   Humiliation, Afraid, Rape, and Kick questionnaire    Fear of Current or Ex-Partner: No    Emotionally Abused: No    Physically Abused: No    Sexually Abused: No    Review of Systems: See HPI, otherwise negative ROS  Physical Exam: Vital signs in last 24 hours:     General:   Alert,  Well-developed, well-nourished, pleasant and cooperative in NAD Head:  Normocephalic and atraumatic. Eyes:  Sclera clear, no icterus.   Conjunctiva pink. Ears:  Normal auditory acuity. Nose:  No deformity, discharge,  or lesions. Msk:  Symmetrical without gross deformities. Normal posture. Extremities:  Without clubbing or edema. Neurologic:  Alert and  oriented x4;  grossly normal neurologically. Skin:  Intact without significant lesions or rashes. Psych:  Alert and cooperative. Normal mood and affect.  Impression/Plan: Dorothy [name] is here for a colonoscopy to be performed for colon cancer screening purposes.  The risks of the procedure including infection, bleed, or perforation as well as benefits, limitations, alternatives and imponderables have been reviewed with the patient. Questions have been answered. All parties agreeable.

## 2024-01-08 ENCOUNTER — Encounter (HOSPITAL_COMMUNITY): Payer: Self-pay | Admitting: Gastroenterology

## 2024-01-08 LAB — SURGICAL PATHOLOGY

## 2024-01-09 NOTE — Progress Notes (Signed)
 I reviewed the pathology results. Ann, can you send her a letter with the findings as described below please?  Repeat colonoscopy in 5 years  Thanks,  Vista Lawman, MD Gastroenterology and Hepatology [facility]  ---------------------------------------------------------------------------------------------  [facility] [address], [address], [place], [place] [address] Phone:  [phone]   01/09/24 [name], [place]   Dear Dorothy [name],  I am writing to inform you that the biopsies taken during your recent endoscopic examination showed:  A. COLON, DESCENDING, SIGMOID AND RECTAL, POLYPECTOMY:  -  Sessile serrated lesion without dysplasia, fragments.  -  Separate fragments of colonic mucosa with mild hyperplastic change.   What does this mean?  I am writing to let you know the results of your recent colonoscopy.  You had a total of 5 polyps removed. The pathology came back as "sessile serrated and hyperplastic" polyp. These findings are NOT cancer, but had the sessile serrated polyps remained in your colon, they could have turn into cancer.  Given these findings, it is recommended that your next colonoscopy be performed in 5 years.  Also I value your feedback , so if you get a survey , please take the time to fill it out and thank you for choosing [facility]  Please call us at [phone] if you have persistent problems or have questions about your condition that have not been fully answered at this time.  Sincerely,  Vista Lawman, MD Gastroenterology and Hepatology

## 2024-01-14 ENCOUNTER — Encounter (INDEPENDENT_AMBULATORY_CARE_PROVIDER_SITE_OTHER): Payer: Self-pay | Admitting: *Deleted

## 2024-01-16 ENCOUNTER — Other Ambulatory Visit: Payer: Self-pay | Admitting: Internal Medicine

## 2024-01-16 NOTE — Telephone Encounter (Signed)
 This was given to her by another provider is this okay to refill . Please advise

## 2024-03-08 NOTE — Progress Notes (Signed)
 Dorothy [name], female    DOB: 17-Apr-1956    MRN: [id number]   Brief patient profile:  87  yowf  quit smoking Feb 2025   referred to pulmonary clinic in [facility]  04/23/2023 by Loris Ros NP  for new 02 dep resp failure s/p admit:    Admit date:     04/10/2023  Discharge date: 04/12/23         Discharge Diagnoses:   COPD exacerbation (HCC)   Primary hypertension   Gastroesophageal reflux disease   Elevated troponin   Bronchiectasis with acute exacerbation [facility])   Brief Hospital admission narrative course: 68 y.o. female with medical history significant for COPD, tobacco abuse, anxiety/depression, hypertension, hypothyroidism, GERD, and dyslipidemia who was recently discharged on 5/27 after treatment for hypertensive crisis.  She presented to the ED with worsening shortness of breath over the last 2 weeks that worsened last night and prompted a call to EMS.  She was recently started on home oxygen per her primary care physician.  She denies a significant cough, fevers, chills, chest pain, or lower extremity edema.  She had significant wheezing requiring nonrebreather mask and nebulizer treatments on arrival.  She continues to smoke 1 pack/day.   ED Course: Vital signs with temperature 100.3 and otherwise stable heart rate and blood pressure.  Leukocytosis of 12,200 and sodium 128.  Troponin 81 and follow-up 168.  EKG with no significant findings aside from sinus tachycardia at 110 bpm.   Assessment and Plan: 1-COPD exacerbation acute/chronic respiratory failure -2 L nasal cannula supplementation in place -Patient denies chest pain and was able to speak in full sentences at time of discharge. -Continue nebulizer as needed management, steroids tapering, Protonix  twice a day, flutter valve and mucolytic's. -Outpatient follow-up with pulmonologist for PFTs and adjustment to maintenance therapy recommended; for now continue the use of trilogy. -Lisinopril  has been substituted to losartan ;  minimizing bronchospasm component. -Continue oral antibiotics at time of discharge for treatment of bronchiectasis. -Continue home oxygen supplementation (2 L at baseline).   2-troponin elevation -No chest pain reported -Most likely demand ischemia -Telemetry and EKG with no acute abnormalities -2D echo reassuring with preserved ejection fraction and no wall motion abnormalities. -Patient received over 36 hours of heparin  drip empirically -Continue treatment for COPD exacerbation and bronchiectasis   3-tobacco abuse -Cessation counseling provided -Continue nicotine  patch. -Patient looking to stop smoking.   4-hypertension -Continue home antihypertensive agents -Stable blood pressure appreciated. -Lisinopril  substituted by losartan .   5-GERD -Continue PPI.   6-dyslipidemia -Continue statin. -Heart healthy diet discussed with patient.   7-anxiety/depression -Continue Abilify  and Klonopin . -Stable mood. -No suicidal ideation or hallucinations   8-hypothyroidism -Continue Synthroid  -Continue to follow intermittent thyroid panel as an outpatient to further adjust Synthroid  as needed.   9-history of chronic hyponatremia -Continue to follow electrolytes -Patient advised to maintain adequate hydration. -recently discontinuation of effexor  and hydrochlorothiazide .   Consultants: None Procedures performed: See below for x-ray reports Disposition: Home Diet recommendation: Heart healthy diet.        History of Present Illness  04/23/2023  Pulmonary/ 1st office eval/ Stclair Szymborski / [place] [facility] maint on trelegy 200 and nomodyne 100 mg bid   Dyspnea:  limited by poor eyesight, not breathing  Cough: rattle esp in am > slt yellow Sleep: 30 degrees electric SABA use: once a day "it's a habit" 02: 3lpm 24/7  Rec I would prefer you avoid Fosamax (alendronate) based on you CT of your esophagus  Alternatives are Reclast  one  infusion per year or prolia injection twice a  year   Continue protonix  (pantoprazole ) Take 30- 60 min before your first and last meals of the day  Remain off cigarettes  Make sure you check your oxygen saturation  AT  your highest level of activity (not after you stop)   to be sure it stays over 90%  Plan A = Automatic = Always=    Trelegy 100 one click each am  Brush tongue and teeth with arm and hammer after use  Plan B = Backup (to supplement plan A, not to replace it) Only use your albuterol  inhaler as a rescue medication  Plan C = Crisis (instead of Plan B but only if Plan B stops working) - only use your albuterol  nebulizer if you first try Plan B For cough/ congestion > mucinex  or mucinex  dm  up to maximum of  1200 mg every 12 hours  as needed   Please schedule a follow up office visit in 6 weeks, call sooner if needed with all medications /inhalers/ solutions in hand    06/11/2023  f/u ov/[place] office/Jayonna Meyering re: prob copd recurrent smoker  maint on nothing   and did not bring meds / on normodyne  100 mg 1/2 daily  Chief Complaint  Patient presents with   COPD  Dyspnea:  MMRC2 = can't walk a nl pace on a flat grade s sob but does fine slow and flat / no change off trelegy which she can't afford  Cough: none  Sleeping: 30 degrees HOB  electric bed s resp cc  SABA use: none  02:  3lpm hs and most of time daytime  Rec Duoneb up to 4  x daily as needed for breathing difficulty cough or wheeze For cough/ congestion > mucinex  or mucinex  dm  up to maximum of  1200 mg every 12 hours as needed  The key is to stop smoking completely before smoking completely stops you! Ok to just ust 02 2lpm at bedtime until  return        09/11/2023  3 m f/u ov/[place] office/Corben Auzenne re: GOLD 2 copd/ 02 hs and prn daytime   maint on trelegy   Chief Complaint  Patient presents with   Shortness of Breath   Dyspnea:  grocery shopping [employer]  Cough: min in am mucoid   Sleeping: 30 degrees hob/ no    resp cc  SABA use: none 02: 3 lpm hs  Lung  cancer screening: not due until 03/2024  Rec Decrease 02 2lpm at bedtime  Make sure you check your oxygen saturation maybe once a week   AT  your highest level of activity (not after you stop)   to be sure it stays over 90% Ok to decrease trelegy to three x a week (Monday, Wed, Friday) as long as not smoking    03/10/2024  6 m f/u ov/[place] office/Emiliana Blaize re: COPD GOLD 2  maint on trelegy one click three times per week and singulair   Chief Complaint  Patient presents with   COPD  Dyspnea:  grocer shopping is ok  Cough: gone  Sleeping: 30 degree hob s   resp cc  SABA use: none  02: none  02 2lpm hs     No obvious day to day or daytime variability or assoc excess/ purulent sputum or mucus plugs or hemoptysis or cp or chest tightness, subjective wheeze or overt sinus or hb symptoms.    Also denies any obvious fluctuation of symptoms with weather  or environmental changes or other aggravating or alleviating factors except as outlined above   No unusual exposure hx or h/o childhood pna/ asthma or knowledge of premature birth.  Current Allergies, Complete Past Medical History, Past Surgical History, Family History, and Social History were reviewed in [employer] record.  ROS  The following are not active complaints unless bolded Hoarseness, sore throat, dysphagia, dental problems, itching, sneezing,  nasal congestion or discharge of excess mucus or purulent secretions, ear ache,   fever, chills, sweats, unintended wt loss or wt gain, classically pleuritic or exertional cp,  orthopnea pnd or arm/hand swelling  or leg swelling, presyncope, palpitations, abdominal pain, anorexia, nausea, vomiting, diarrhea  or change in bowel habits or change in bladder habits, change in stools or change in urine, dysuria, hematuria,  rash, arthralgias, visual complaints, headache, numbness, weakness or ataxia or problems with walking or coordination,  change in mood or  memory.         Current Meds  Medication Sig   acetaZOLAMIDE  ER (DIAMOX ) 500 MG capsule Take 1 capsule by mouth 2 (two) times daily.   ARIPiprazole  (ABILIFY ) 10 MG tablet Take 5 mg by mouth daily. Takes 1/2 daily   aspirin  EC 81 MG tablet Take 81 mg by mouth daily. Swallow whole.   atorvastatin  (LIPITOR) 40 MG tablet Take 40 mg by mouth daily.   azelastine  (ASTELIN ) 0.1 % nasal spray Place 2 sprays into both nostrils 2 (two) times daily.   brimonidine (ALPHAGAN) 0.2 % ophthalmic solution Place 1 drop into the right eye 3 (three) times daily.   CALCIUM  PO Take 2 tablets by mouth daily.   Cholecalciferol 50 MCG (2000 UT) TABS Take 1 tablet by mouth daily at 6 (six) AM.   clonazePAM  (KLONOPIN ) 0.5 MG tablet Take 1 mg by mouth 2 (two) times daily.   clopidogrel (PLAVIX) 75 MG tablet Take 75 mg by mouth daily.   dorzolamide -timolol  (COSOPT ) 22.3-6.8 MG/ML ophthalmic solution Place 1 drop into the right eye 2 (two) times daily.   ipratropium-albuterol  (DUONEB) 0.5-2.5 (3) MG/3ML SOLN Take 3 mLs by nebulization every 6 (six) hours as needed.   labetalol  (NORMODYNE ) 100 MG tablet Take 1 tablet (100 mg total) by mouth 2 (two) times daily. (Patient taking differently: Take 100 mg by mouth 2 (two) times daily. 1/2 tab BID)   latanoprost  (XALATAN ) 0.005 % ophthalmic solution Place 1 drop into the right eye at bedtime.   levocetirizine (XYZAL ) 5 MG tablet Take 5 mg by mouth in the morning.   losartan  (COZAAR ) 25 MG tablet Take 1 tablet (25 mg total) by mouth daily.   Melatonin (MELATONIN EXTRA STRENGTH) 10 MG TABS Take 1 tablet by mouth at bedtime.   montelukast  (SINGULAIR ) 10 MG tablet Take 10 mg by mouth at bedtime.   Multiple Vitamin (MULTI-VITAMIN) tablet Take 2 tablets by mouth daily.   nicotine  (NICODERM CQ  - DOSED IN MG/24 HOURS) 21 mg/24hr patch Place 1 patch (21 mg total) onto the skin daily.   pantoprazole  (PROTONIX ) 40 MG tablet Take 1 tablet (40 mg total) by mouth 2 (two) times daily. (Patient taking  differently: Take 40 mg by mouth daily.)   TRELEGY ELLIPTA  100-62.5-25 MCG/ACT AEPB INHALE ONE PUFF INTO THE LUNGS ONCE DAILY   Zoledronic  Acid (RECLAST  IV) Inject 5 mg into the vein once.   zolpidem  (AMBIEN ) 10 MG tablet Take 10 mg by mouth at bedtime as needed for sleep.  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   Benign essential HTN 01/17/2012   Cancer (HCC) 1982   ovarian   Cancer of cervix (HCC)    Constipation    Depression    Takes Klonopin    Depression with anxiety 01/17/2012   GERD (gastroesophageal reflux disease)    Hypercholesteremia    Hypertension    takes meds   Hypothyroidism    on Levonthyroxine   Hypothyroidism (acquired) 01/17/2012   Since age 68   Low sodium levels 09/25/2022   Na level 125 at [facility]   Osteopenia 2018   forearm   Osteoporosis 07/25/2021   forearm   Ovarian cyst 01/17/2012   1995  BSO   Personal history of colonic polyps 11/07/2007   Pneumonia    x 2- last time > 5 years ago   Pneumonia 01/17/2012   10 yrs ago and 5 years ago    Rash and other nonspecific skin eruption 01/17/2012   Dorsum hands & extensor surfaces arms; intermittent      Objective:    Wts  03/10/2024       99  09/11/2023     144   06/11/23 138 lb (62.6 kg)  04/23/23 120 lb 3.2 oz (54.5 kg)  04/18/23 122 lb (55.3 kg)    Vital signs reviewed  03/10/2024  - Note at rest 02 sats 99% on RA   General appearance:    amb pleasant wf nad     HEENT : Oropharynx  clear   Nasal turbinates nl    NECK :  without  apparent JVD/ palpable Nodes/TM    LUNGS: no acc muscle use,  Min barrel  contour chest wall with bilateral  slightly decreased bs s audible wheeze and  without cough on insp or exp maneuvers and min  Hyperresonant  to  percussion bilaterally    CV:  RRR  no s3 or murmur or increase in P2, and no edema   ABD:   soft and nontender with pos end  insp Hoover's  in the supine position.  No bruits or organomegaly appreciated   MS:  Nl gait/ ext warm without deformities Or obvious joint restrictions  calf tenderness, cyanosis or clubbing     SKIN: warm and dry without lesions    NEURO:  alert, approp, nl sensorium with  no motor or cerebellar deficits apparent.          Assessment

## 2024-03-10 ENCOUNTER — Ambulatory Visit: Payer: Medicare HMO | Admitting: Internal Medicine

## 2024-03-10 ENCOUNTER — Encounter: Payer: Self-pay | Admitting: Internal Medicine

## 2024-03-10 VITALS — BP 120/78 | HR 64 | Ht 66.0 in | Wt 144.2 lb

## 2024-03-10 DIAGNOSIS — K219 Gastro-esophageal reflux disease without esophagitis: Secondary | ICD-10-CM

## 2024-03-10 DIAGNOSIS — F419 Anxiety disorder, unspecified: Secondary | ICD-10-CM

## 2024-03-10 DIAGNOSIS — F17210 Nicotine dependence, cigarettes, uncomplicated: Secondary | ICD-10-CM

## 2024-03-10 DIAGNOSIS — J9611 Chronic respiratory failure with hypoxia: Secondary | ICD-10-CM | POA: Diagnosis not present

## 2024-03-10 DIAGNOSIS — I10 Essential (primary) hypertension: Secondary | ICD-10-CM

## 2024-03-10 DIAGNOSIS — R7989 Other specified abnormal findings of blood chemistry: Secondary | ICD-10-CM | POA: Diagnosis not present

## 2024-03-10 DIAGNOSIS — J449 Chronic obstructive pulmonary disease, unspecified: Secondary | ICD-10-CM

## 2024-03-10 DIAGNOSIS — F32A Depression, unspecified: Secondary | ICD-10-CM

## 2024-03-10 DIAGNOSIS — E785 Hyperlipidemia, unspecified: Secondary | ICD-10-CM

## 2024-03-10 DIAGNOSIS — E039 Hypothyroidism, unspecified: Secondary | ICD-10-CM

## 2024-03-10 NOTE — Assessment & Plan Note (Signed)
 Low-dose CT lung cancer screening is recommended for patients who are 48-66 years of age with a 20+ pack-year history of smoking and who are currently smoking or quit <=15 years ago. No coughing up blood  No unintentional weight loss of > 15 pounds in the last 6 months - pt is eligible for scanning yearly until age 15 > she wishes to discuss with her PCP and f/u can be arranged thru this office if  desired but since I'm not planning regular f/u here it's fine with me to do this independent of this office          Each maintenance medication was reviewed in detail including emphasizing most importantly the difference between maintenance and prns and under what circumstances the prns are to be triggered using an action plan format where appropriate.  Total time for H and P, chart review, counseling, reviewing hfa/02 device(s) and generating customized AVS unique to this office visit / same day charting = 25 min summary final f/u ov.

## 2024-03-10 NOTE — Assessment & Plan Note (Signed)
 Active smoker s/p  admit with aecopd/ acute on chronic resp failure 04/10/23  - CTa 04/08/23  emphysema only  - 04/23/2023  After extensive coaching inhaler device,  effectiveness =    75% with DPI/ elipta (limited by eyesight to having her sister open it and administer)  - PFT's  08/14/23  FEV1 2.04 (76 % ) ratio 0.62  p 0 % improvement from saba p trelegy  prior to study with DLCO  13.5 (62%)   and FV curve mildly concave     Since quit smoking she is more of a Group A  >nothing needed but saba prn so d/c trelegy   Pulmonary f/u is prn

## 2024-03-10 NOTE — Assessment & Plan Note (Signed)
 Placed on 02 by PCP prior to admit 04/10/23  - 06/11/2023   Walked on RA  x  3  lap(s) =  approx 450  ft  @ nl  pace, stopped due to end of study  with lowest 02 sats 97% so changed to 2lpm hs and prn daytime  - ONO RA ordered 03/10/2024 at pt request with intent of stopping 02

## 2024-03-10 NOTE — Patient Instructions (Signed)
 Ok to try off trelegy and just use albuterol  if needed  - if doing worse with breathing or needing more albuterol  then start back ton Trelegy   My office will be contacting you by phone for referral to overnight 02 on RA  - if you don't hear back from my office within one week please call us  back or notify us  thru MyChart and we'll address it right away.   I recommend yearly low dose screening CT cheat yearly x 14 years = age 67   Pulmonary follow up is as needed

## 2024-03-25 ENCOUNTER — Other Ambulatory Visit (HOSPITAL_COMMUNITY): Payer: Self-pay | Admitting: Nurse Practitioner

## 2024-03-25 ENCOUNTER — Telehealth: Payer: Self-pay | Admitting: Internal Medicine

## 2024-03-25 DIAGNOSIS — J9611 Chronic respiratory failure with hypoxia: Secondary | ICD-10-CM

## 2024-03-25 DIAGNOSIS — Z720 Tobacco use: Secondary | ICD-10-CM

## 2024-03-25 NOTE — Telephone Encounter (Signed)
 Spoke with patient regarding her ONO results, and confirmed she does not need her O2 rx. she understood.

## 2024-03-25 NOTE — Addendum Note (Signed)
 Addended by: Michael Ades T on: 03/25/2024 12:12 PM   Modules accepted: Orders

## 2024-03-25 NOTE — Telephone Encounter (Signed)
 No desats on ONO 6/5 so d/c all 02

## 2024-04-01 ENCOUNTER — Encounter: Payer: Self-pay | Admitting: Internal Medicine

## 2024-04-09 ENCOUNTER — Ambulatory Visit (HOSPITAL_COMMUNITY)
Admission: RE | Admit: 2024-04-09 | Discharge: 2024-04-09 | Disposition: A | Discharge status: Home / Self Care | Source: Ambulatory Visit | Attending: Nurse Practitioner | Admitting: Nurse Practitioner

## 2024-04-09 DIAGNOSIS — Z87891 Personal history of nicotine dependence: Secondary | ICD-10-CM | POA: Insufficient documentation

## 2024-04-09 DIAGNOSIS — Z122 Encounter for screening for malignant neoplasm of respiratory organs: Secondary | ICD-10-CM | POA: Insufficient documentation

## 2024-04-09 DIAGNOSIS — I700 Atherosclerosis of aorta: Secondary | ICD-10-CM | POA: Insufficient documentation

## 2024-04-09 DIAGNOSIS — Z720 Tobacco use: Secondary | ICD-10-CM

## 2024-04-09 DIAGNOSIS — J439 Emphysema, unspecified: Secondary | ICD-10-CM | POA: Diagnosis not present

## 2024-04-09 DIAGNOSIS — I2510 Atherosclerotic heart disease of native coronary artery without angina pectoris: Secondary | ICD-10-CM | POA: Insufficient documentation

## 2024-06-10 ENCOUNTER — Encounter (HOSPITAL_COMMUNITY): Payer: Self-pay | Admitting: Emergency Medicine

## 2024-06-10 ENCOUNTER — Emergency Department (HOSPITAL_COMMUNITY)

## 2024-06-10 ENCOUNTER — Observation Stay (HOSPITAL_COMMUNITY)
Admission: EM | Admit: 2024-06-10 | Discharge: 2024-06-12 | Disposition: A | Discharge status: Home / Self Care | Source: Ambulatory Visit | Attending: Family Medicine | Admitting: Family Medicine

## 2024-06-10 ENCOUNTER — Other Ambulatory Visit: Payer: Self-pay

## 2024-06-10 DIAGNOSIS — I639 Cerebral infarction, unspecified: Secondary | ICD-10-CM | POA: Diagnosis not present

## 2024-06-10 DIAGNOSIS — Z8673 Personal history of transient ischemic attack (TIA), and cerebral infarction without residual deficits: Secondary | ICD-10-CM | POA: Diagnosis not present

## 2024-06-10 DIAGNOSIS — I160 Hypertensive urgency: Secondary | ICD-10-CM | POA: Diagnosis not present

## 2024-06-10 DIAGNOSIS — R519 Headache, unspecified: Secondary | ICD-10-CM | POA: Diagnosis present

## 2024-06-10 DIAGNOSIS — J449 Chronic obstructive pulmonary disease, unspecified: Secondary | ICD-10-CM | POA: Diagnosis not present

## 2024-06-10 DIAGNOSIS — F329 Major depressive disorder, single episode, unspecified: Secondary | ICD-10-CM | POA: Diagnosis not present

## 2024-06-10 DIAGNOSIS — I161 Hypertensive emergency: Principal | ICD-10-CM

## 2024-06-10 LAB — COMPREHENSIVE METABOLIC PANEL WITH GFR
ALT: 22 U/L (ref 0–44)
AST: 20 U/L (ref 15–41)
Albumin: 3.7 g/dL (ref 3.5–5.0)
Alkaline Phosphatase: 57 U/L (ref 38–126)
Anion gap: 13 (ref 5–15)
BUN: 15 mg/dL (ref 8–23)
CO2: 25 mmol/L (ref 22–32)
Calcium: 8.6 mg/dL — ABNORMAL LOW (ref 8.9–10.3)
Chloride: 105 mmol/L (ref 98–111)
Creatinine, Ser: 1.04 mg/dL — ABNORMAL HIGH (ref 0.44–1.00)
GFR, Estimated: 59 mL/min — ABNORMAL LOW (ref >60)
Glucose, Bld: 131 mg/dL — ABNORMAL HIGH (ref 70–99)
Potassium: 3.7 mmol/L (ref 3.5–5.1)
Sodium: 143 mmol/L (ref 135–145)
Total Bilirubin: 0.5 mg/dL (ref 0.0–1.2)
Total Protein: 6.4 g/dL — ABNORMAL LOW (ref 6.5–8.1)

## 2024-06-10 LAB — PROTIME-INR
INR: 1 (ref 0.8–1.2)
Prothrombin Time: 13.3 s (ref 11.4–15.2)

## 2024-06-10 LAB — APTT: aPTT: 28 s (ref 24–36)

## 2024-06-10 LAB — CBC
HCT: 40.5 % (ref 36.0–46.0)
Hemoglobin: 13.9 g/dL (ref 12.0–15.0)
MCH: 28.9 pg (ref 26.0–34.0)
MCHC: 34.3 g/dL (ref 30.0–36.0)
MCV: 84.2 fL (ref 80.0–100.0)
Platelets: 197 K/uL (ref 150–400)
RBC: 4.81 MIL/uL (ref 3.87–5.11)
RDW: 13.9 % (ref 11.5–15.5)
WBC: 7.5 K/uL (ref 4.0–10.5)
nRBC: 0 % (ref 0.0–0.2)

## 2024-06-10 LAB — MRSA NEXT GEN BY PCR, NASAL: MRSA by PCR Next Gen: NOT DETECTED

## 2024-06-10 LAB — BRAIN NATRIURETIC PEPTIDE: B Natriuretic Peptide: 231 pg/mL — ABNORMAL HIGH (ref 0.0–100.0)

## 2024-06-10 MED ORDER — HYDRALAZINE HCL 10 MG PO TABS
10.0000 mg | ORAL_TABLET | Freq: Once | ORAL | Status: AC
  Administered 2024-06-10: 10 mg via ORAL
  Filled 2024-06-10: qty 1

## 2024-06-10 MED ORDER — POLYETHYLENE GLYCOL 3350 17 G PO PACK: 17.0000 g | PACK | Freq: Every day | ORAL | Status: DC | PRN

## 2024-06-10 MED ORDER — ACETAZOLAMIDE ER 500 MG PO CP12
500.0000 mg | ORAL_CAPSULE | Freq: Two times a day (BID) | ORAL | Status: DC
  Administered 2024-06-10 – 2024-06-12 (×4): 500 mg via ORAL
  Filled 2024-06-10 (×6): qty 1

## 2024-06-10 MED ORDER — VENLAFAXINE HCL ER 75 MG PO CP24
75.0000 mg | ORAL_CAPSULE | Freq: Every day | ORAL | Status: DC
  Administered 2024-06-11 – 2024-06-12 (×2): 75 mg via ORAL
  Filled 2024-06-10 (×2): qty 1

## 2024-06-10 MED ORDER — CLONAZEPAM 0.5 MG PO TABS
1.0000 mg | ORAL_TABLET | Freq: Two times a day (BID) | ORAL | Status: DC
  Administered 2024-06-10: 1 mg via ORAL
  Filled 2024-06-10: qty 2

## 2024-06-10 MED ORDER — ASPIRIN 81 MG PO TBEC
81.0000 mg | DELAYED_RELEASE_TABLET | Freq: Every day | ORAL | Status: DC
  Administered 2024-06-11 – 2024-06-12 (×2): 81 mg via ORAL
  Filled 2024-06-10 (×2): qty 1

## 2024-06-10 MED ORDER — ONDANSETRON HCL 4 MG PO TABS: 4.0000 mg | ORAL_TABLET | Freq: Four times a day (QID) | ORAL | Status: DC | PRN

## 2024-06-10 MED ORDER — HYDRALAZINE HCL 20 MG/ML IJ SOLN: 5.0000 mg | INTRAMUSCULAR | Status: DC | PRN

## 2024-06-10 MED ORDER — CLONAZEPAM 0.5 MG PO TABS
ORAL_TABLET | ORAL | Status: AC
  Filled 2024-06-10: qty 2

## 2024-06-10 MED ORDER — LATANOPROST 0.005 % OP SOLN
1.0000 [drp] | Freq: Every day | OPHTHALMIC | Status: DC
  Administered 2024-06-10 – 2024-06-11 (×2): 1 [drp] via OPHTHALMIC
  Filled 2024-06-10: qty 2.5

## 2024-06-10 MED ORDER — ARIPIPRAZOLE 5 MG PO TABS
5.0000 mg | ORAL_TABLET | Freq: Every day | ORAL | Status: DC
  Administered 2024-06-11 – 2024-06-12 (×2): 5 mg via ORAL
  Filled 2024-06-10 (×2): qty 1

## 2024-06-10 MED ORDER — CIPROFLOXACIN HCL 0.3 % OP SOLN
1.0000 [drp] | Freq: Four times a day (QID) | OPHTHALMIC | Status: DC
  Administered 2024-06-10 – 2024-06-12 (×6): 1 [drp] via OPHTHALMIC
  Filled 2024-06-10: qty 2.5

## 2024-06-10 MED ORDER — ACETAMINOPHEN 650 MG RE SUPP: 650.0000 mg | Freq: Four times a day (QID) | RECTAL | Status: DC | PRN

## 2024-06-10 MED ORDER — ZOLPIDEM TARTRATE 5 MG PO TABS
5.0000 mg | ORAL_TABLET | Freq: Every day | ORAL | Status: DC
  Administered 2024-06-10 – 2024-06-11 (×2): 5 mg via ORAL
  Filled 2024-06-10: qty 1

## 2024-06-10 MED ORDER — ZOLPIDEM TARTRATE 5 MG PO TABS
ORAL_TABLET | ORAL | Status: AC
  Filled 2024-06-10: qty 1

## 2024-06-10 MED ORDER — LORATADINE 10 MG PO TABS
10.0000 mg | ORAL_TABLET | Freq: Every day | ORAL | Status: DC
  Administered 2024-06-10 – 2024-06-11 (×2): 10 mg via ORAL
  Filled 2024-06-10: qty 1

## 2024-06-10 MED ORDER — NICARDIPINE HCL IN NACL 20-0.86 MG/200ML-% IV SOLN
3.0000 mg/h | INTRAVENOUS | Status: DC
  Administered 2024-06-10: 2.5 mg/h via INTRAVENOUS
  Administered 2024-06-10: 5 mg/h via INTRAVENOUS
  Filled 2024-06-10: qty 200

## 2024-06-10 MED ORDER — LEVOTHYROXINE SODIUM 112 MCG PO TABS
112.0000 ug | ORAL_TABLET | Freq: Every day | ORAL | Status: DC
  Administered 2024-06-11 – 2024-06-12 (×2): 112 ug via ORAL
  Filled 2024-06-10 (×2): qty 1

## 2024-06-10 MED ORDER — PREDNISOLONE ACETATE 1 % OP SUSP
1.0000 [drp] | Freq: Four times a day (QID) | OPHTHALMIC | Status: DC
  Administered 2024-06-10 – 2024-06-12 (×6): 1 [drp] via OPHTHALMIC
  Filled 2024-06-10: qty 1

## 2024-06-10 MED ORDER — ENOXAPARIN SODIUM 40 MG/0.4ML IJ SOSY
PREFILLED_SYRINGE | INTRAMUSCULAR | Status: AC
  Filled 2024-06-10: qty 0.4

## 2024-06-10 MED ORDER — ACETAMINOPHEN 325 MG PO TABS
650.0000 mg | ORAL_TABLET | Freq: Four times a day (QID) | ORAL | Status: DC | PRN
  Administered 2024-06-10 – 2024-06-11 (×2): 650 mg via ORAL
  Filled 2024-06-10: qty 2

## 2024-06-10 MED ORDER — CHLORHEXIDINE GLUCONATE CLOTH 2 % EX PADS
6.0000 | MEDICATED_PAD | Freq: Every day | CUTANEOUS | Status: DC
  Administered 2024-06-10 – 2024-06-12 (×2): 6 via TOPICAL

## 2024-06-10 MED ORDER — ACETAMINOPHEN 500 MG PO TABS
ORAL_TABLET | ORAL | Status: AC
  Filled 2024-06-10: qty 1

## 2024-06-10 MED ORDER — ATORVASTATIN CALCIUM 40 MG PO TABS
40.0000 mg | ORAL_TABLET | Freq: Every day | ORAL | Status: DC
  Administered 2024-06-11 – 2024-06-12 (×2): 40 mg via ORAL
  Filled 2024-06-10 (×2): qty 1

## 2024-06-10 MED ORDER — PANTOPRAZOLE SODIUM 40 MG PO TBEC
40.0000 mg | DELAYED_RELEASE_TABLET | Freq: Every day | ORAL | Status: DC
  Administered 2024-06-11 – 2024-06-12 (×2): 40 mg via ORAL
  Filled 2024-06-10 (×2): qty 1

## 2024-06-10 MED ORDER — ENOXAPARIN SODIUM 40 MG/0.4ML IJ SOSY
40.0000 mg | PREFILLED_SYRINGE | INTRAMUSCULAR | Status: DC
  Administered 2024-06-10 – 2024-06-11 (×2): 40 mg via SUBCUTANEOUS
  Filled 2024-06-10: qty 0.4

## 2024-06-10 MED ORDER — DORZOLAMIDE HCL-TIMOLOL MAL 2-0.5 % OP SOLN
1.0000 [drp] | Freq: Two times a day (BID) | OPHTHALMIC | Status: DC
  Administered 2024-06-10 – 2024-06-12 (×4): 1 [drp] via OPHTHALMIC
  Filled 2024-06-10: qty 10

## 2024-06-10 MED ORDER — LOSARTAN POTASSIUM 50 MG PO TABS
50.0000 mg | ORAL_TABLET | Freq: Every day | ORAL | Status: DC
  Administered 2024-06-11 – 2024-06-12 (×2): 50 mg via ORAL
  Filled 2024-06-10 (×2): qty 1

## 2024-06-10 MED ORDER — AMLODIPINE BESYLATE 5 MG PO TABS
5.0000 mg | ORAL_TABLET | Freq: Every day | ORAL | Status: DC
  Administered 2024-06-10 – 2024-06-11 (×2): 5 mg via ORAL
  Filled 2024-06-10 (×2): qty 1

## 2024-06-10 MED ORDER — ACETAMINOPHEN 500 MG PO TABS
1000.0000 mg | ORAL_TABLET | Freq: Once | ORAL | Status: AC
  Administered 2024-06-10: 1000 mg via ORAL
  Filled 2024-06-10: qty 2

## 2024-06-10 MED ORDER — ACETAMINOPHEN 325 MG PO TABS
ORAL_TABLET | ORAL | Status: AC
  Filled 2024-06-10: qty 2

## 2024-06-10 MED ORDER — BRIMONIDINE TARTRATE 0.2 % OP SOLN
1.0000 [drp] | Freq: Three times a day (TID) | OPHTHALMIC | Status: DC
  Administered 2024-06-10 – 2024-06-12 (×5): 1 [drp] via OPHTHALMIC
  Filled 2024-06-10: qty 5

## 2024-06-10 MED ORDER — MONTELUKAST SODIUM 10 MG PO TABS
10.0000 mg | ORAL_TABLET | Freq: Every day | ORAL | Status: DC
  Administered 2024-06-11 – 2024-06-12 (×2): 10 mg via ORAL
  Filled 2024-06-10 (×2): qty 1

## 2024-06-10 MED ORDER — LORATADINE 10 MG PO TABS
ORAL_TABLET | ORAL | Status: AC
  Filled 2024-06-10: qty 1

## 2024-06-10 MED ORDER — LEVOCETIRIZINE DIHYDROCHLORIDE 5 MG PO TABS: 5.0000 mg | ORAL_TABLET | Freq: Every evening | ORAL | Status: DC

## 2024-06-10 MED ORDER — CLOPIDOGREL BISULFATE 75 MG PO TABS
75.0000 mg | ORAL_TABLET | Freq: Every day | ORAL | Status: DC
  Administered 2024-06-11 – 2024-06-12 (×2): 75 mg via ORAL
  Filled 2024-06-10 (×2): qty 1

## 2024-06-10 MED ORDER — ONDANSETRON HCL 4 MG/2ML IJ SOLN: 4.0000 mg | Freq: Four times a day (QID) | INTRAMUSCULAR | Status: DC | PRN

## 2024-06-10 NOTE — ED Notes (Signed)
 Pt ambulated to restroom.

## 2024-06-10 NOTE — ED Provider Notes (Signed)
 [place] EMERGENCY DEPARTMENT AT [facility] Provider Note   CSN: [id number] Arrival date & time: 06/10/24  1453     Patient presents with: Hypertension   Dorothy [name] is a 68 y.o. female.    Hypertension Pertinent negatives include no chest pain, no abdominal pain and no shortness of breath.    Patient presents because of hypertension.  Patient states he has been having hypotensive episode for the past week.  Patient states her blood pressure has been running at least 220 to 230 systolic during this time.  She has been compliant with her home antihypertensives but she is not sure what she has been taken.  She does not think she is missing any doses.  She endorses a headache during this time.  Endorses a generalized headache.  No obvious vision changes.  No diplopia.  No numbness or tingling aware.  No speech difficulties.  No falls.  Denies any kind of chest pain or shortness of breath.  This endorses a generalized headache in the setting of his hypertension.   Previous medical history reviewed : Patient was last admitted and discharged in 2025 in May from [facility].  Was admitted because of carotid surgery.  It was placed on left side.     Prior to Admission medications   Medication Sig Start Date End Date Taking? Authorizing Provider  acetaZOLAMIDE  ER (DIAMOX ) 500 MG capsule Take 1 capsule by mouth 2 (two) times daily. 03/20/23  Yes [provider]  ARIPiprazole  (ABILIFY ) 10 MG tablet Take 5 mg by mouth daily. 02/16/17  Yes [provider]  aspirin  EC 81 MG tablet Take 81 mg by mouth daily. Swallow whole.   Yes [provider]  atorvastatin  (LIPITOR) 40 MG tablet Take 40 mg by mouth daily. 06/19/22  Yes [provider]  brimonidine  (ALPHAGAN ) 0.2 % ophthalmic solution Place 1 drop into the right eye 3 (three) times daily.   Yes [provider]  CALCIUM  PO Take 1 tablet by mouth in the morning and at bedtime.   Yes [provider]  carboxymethylcellulose (REFRESH PLUS) 0.5 % SOLN Place 1 drop into the left eye in the morning, at noon, and at bedtime.   Yes [provider]  ciprofloxacin  (CILOXAN ) 0.3 % ophthalmic solution Place 1 drop into the right eye in the morning, at noon, in the evening, and at bedtime. Start after surgery. 04/28/24  Yes [provider]  clonazePAM  (KLONOPIN ) 0.5 MG tablet Take 1 mg by mouth 2 (two) times daily.   Yes [provider]  clopidogrel  (PLAVIX ) 75 MG tablet Take 75 mg by mouth daily.   Yes [provider]  dorzolamide -timolol  (COSOPT ) 22.3-6.8 MG/ML ophthalmic solution Place 1 drop into the right eye 2 (two) times daily.   Yes [provider]  latanoprost  (XALATAN ) 0.005 % ophthalmic solution Place 1 drop into the right eye at bedtime.   Yes [provider]  levocetirizine (XYZAL ) 5 MG tablet Take 5 mg by mouth every evening.   Yes [provider]  levothyroxine  (SYNTHROID ) 112 MCG tablet Take 112 mcg by mouth daily before breakfast.   Yes [provider]  losartan  (COZAAR ) 50 MG tablet Take 50 mg by mouth daily.   Yes [provider]  montelukast  (SINGULAIR ) 10 MG tablet Take 10 mg by mouth daily. 01/03/23  Yes [provider]  Multiple Vitamin (MULTI-VITAMIN) tablet Take 2 tablets by mouth daily.   Yes [provider]  pantoprazole  (PROTONIX ) 40  MG tablet Take 1 tablet (40 mg total) by mouth 2 (two) times daily. Patient taking differently: Take 40 mg by mouth daily. 04/12/23  Yes Ricky Fines, MD  prednisoLONE  acetate (PRED FORTE ) 1 % ophthalmic suspension Place 1 drop into the right eye 4 (four) times daily. Start after surgery. 04/28/24  Yes [provider]  venlafaxine  XR (EFFEXOR -XR) 75 MG 24 hr capsule Take 75 mg by mouth daily. 06/03/24  Yes [provider]  Zoledronic  Acid (RECLAST  IV) Inject 5 mg into the vein as directed. Once yearly for your bones   Yes  [provider]  zolpidem  (AMBIEN ) 10 MG tablet Take 10 mg by mouth at bedtime. 02/04/19  Yes [provider]    Allergies: Sulfa antibiotics    Review of Systems  Constitutional:  Negative for chills and fever.  HENT:  Negative for ear pain and sore throat.   Eyes:  Negative for pain and visual disturbance.  Respiratory:  Negative for cough and shortness of breath.   Cardiovascular:  Negative for chest pain and palpitations.  Gastrointestinal:  Negative for abdominal pain and vomiting.  Genitourinary:  Negative for dysuria and hematuria.  Musculoskeletal:  Negative for arthralgias and back pain.  Skin:  Negative for color change and rash.  Neurological:  Negative for seizures and syncope.  All other systems reviewed and are negative.   Updated Vital Signs BP (!) 222/110   Pulse 65   Temp (!) 97.5 F (36.4 C) (Oral)   Resp 14   Ht 5' 6 (1.676 m)   Wt 65 kg   LMP 10/23/1980 Comment: Hyst age 57(ovaries retained)  SpO2 98%   BMI 23.13 kg/m   Physical Exam Vitals and nursing note reviewed.  Constitutional:      General: She is not in acute distress.    Appearance: She is well-developed.  HENT:     Head: Normocephalic and atraumatic.  Eyes:     Conjunctiva/sclera: Conjunctivae normal.  Cardiovascular:     Rate and Rhythm: Normal rate and regular rhythm.     Heart sounds: No murmur heard. Pulmonary:     Effort: Pulmonary effort is normal. No respiratory distress.     Breath sounds: Normal breath sounds.  Abdominal:     Palpations: Abdomen is soft.     Tenderness: There is no abdominal tenderness.  Musculoskeletal:        General: No swelling.     Cervical back: Neck supple.  Skin:    General: Skin is warm and dry.     Capillary Refill: Capillary refill takes less than 2 seconds.  Neurological:     Mental Status: She is alert.  Psychiatric:        Mood and Affect: Mood normal.     (all labs ordered are listed, but only abnormal results are  displayed) Labs Reviewed  BRAIN NATRIURETIC PEPTIDE - Abnormal; Notable for the following components:      Result Value   B Natriuretic Peptide 231.0 (*)    All other components within normal limits  COMPREHENSIVE METABOLIC PANEL WITH GFR - Abnormal; Notable for the following components:   Glucose, Bld 131 (*)    Creatinine, Ser 1.04 (*)    Calcium  8.6 (*)    Total Protein 6.4 (*)    GFR, Estimated 59 (*)    All other components within normal limits  CBC  PROTIME-INR  APTT    EKG: EKG Interpretation Date/Time:  Tuesday June 10 2024 15:07:59 EDT Ventricular Rate:  67 PR Interval:  179 QRS Duration:  98 QT Interval:  404 QTC Calculation: 427 R Axis:   68  Text Interpretation: Sinus rhythm Probable left ventricular hypertrophy Confirmed by Simon Rea [phone]) on 06/10/2024 3:26:03 PM  Radiology: CT Head Wo Contrast Result Date: 06/10/2024 CLINICAL DATA:  eval for any evidence of bleed. Hypertensive emergency EXAM: CT HEAD WITHOUT CONTRAST TECHNIQUE: Contiguous axial images were obtained from the base of the skull through the vertex without intravenous contrast. RADIATION DOSE REDUCTION: This exam was performed according to the departmental dose-optimization program which includes automated exposure control, adjustment of the mA and/or kV according to patient size and/or use of iterative reconstruction technique. COMPARISON:  CT head May 29 24. FINDINGS: Brain: Remote right PCA territory infarct with similar calcifications. No evidence of acute large vascular territory infarct, acute hemorrhage, mass lesion, midline shift or hydrocephalus. Vascular: Calcific atherosclerosis.  No hyperdense vessel. Skull: No acute fracture. Sinuses/Orbits: Clear sinuses. No acute orbital findings. Right glaucoma shunt. Other: No mastoid effusions. IMPRESSION: 1. No evidence of acute intracranial abnormality. 2. Remote right PCA territory infarct. Electronically Signed   By: Gilmore GORMAN Molt M.D.   On:  06/10/2024 17:52   DG Chest Port 1 View Result Date: 06/10/2024 CLINICAL DATA:  Hypertension, headache. Evaluate for pulmonary edema. EXAM: PORTABLE CHEST 1 VIEW COMPARISON:  Prior CT scan of the chest 04/09/2024; prior chest x-ray 04/18/2023 FINDINGS: Cardiac and mediastinal contours are unchanged and within normal limits. No evidence of pulmonary edema, focal airspace infiltrate, pleural effusion or pneumothorax. Chronic bronchitic changes are stable compared to prior imaging. No acute osseous abnormality. IMPRESSION: 1. No acute cardiopulmonary process. Specifically, no evidence of pulmonary edema or heart failure. 2. Stable chronic bronchitic changes. Electronically Signed   By: Wilkie Lent M.D.   On: 06/10/2024 15:37     Procedures   Medications Ordered in the ED  nicardipine  (CARDENE ) 20mg  in 0.86% saline 200ml IV infusion (0.1 mg/ml) (0 mg/hr Intravenous Stopped 06/10/24 1609)  acetaminophen  (TYLENOL ) tablet 1,000 mg (1,000 mg Oral Given 06/10/24 1640)  hydrALAZINE  (APRESOLINE ) tablet 10 mg (10 mg Oral Given 06/10/24 1849)                      NIH Stroke Scale: 0              Medical Decision Making Amount and/or Complexity of Data Reviewed Labs: ordered. Radiology: ordered.  Risk OTC drugs. Prescription drug management. Decision regarding hospitalization.  Patient presents because of hypertension.  Patient states he has been having hypotensive episode for the past week.  Patient states her blood pressure has been running at least 220 to 230 systolic during this time.  She has been compliant with her home antihypertensives but she is not sure what she has been taken.  She does not think she is missing any doses.  She endorses a headache during this time.  Endorses a generalized headache.  No obvious vision changes.  No diplopia.  No numbness or tingling aware.  No speech difficulties.  No falls.  Denies any kind of chest pain or shortness of breath.  This endorses a generalized  headache in the setting of his hypertension.   Previous medical history reviewed : Patient was last admitted and discharged in 2025 in May from [facility].  Was admitted because of carotid surgery.  It was placed on left side.   Upon exam, patient hypertensive.  Has number here in the ED with systolic of 250.  When I was in the room, systolic in the 230s.  Endorsing a headache.  Otherwise, neurologically intact.  NIH is 0.  Cranials 2 through 12 intact with equal sensation bilateral face as well as no facial droop.  Strength and sensation intact in bilateral upper and lower extremities.  No pronator drift.  No leg drift.  Normal finger-nose.   This significantly high blood pressure with systolic in 250 as well as headache, I will consider this hypertensive emergency.  Therefore, started patient on nicardipine  drip.  Do not want to drop the patient more than 20% in the first hour.  Therefore, goal of systolic of [phone] initially.    Shortly thereafter starting nicardipine  drip, patient's systolic to drop to 180-190.  Subsequently turned the drip off.  After about 3 hours, patient systolic at one point was 160.  Patient remained neuro intact.  No numbness or tingling.  No speech difficulties.  Patient also states her headache completely resolved.  Checked in the patient again, systolic around 200.  Started patient on oral hydralazine .   No endorgan damage.  I do consider this hypertensive emergency given patient's headache.  Patient will be admitted for further blood pressure control and blood pressure management.  He had negative for any kind of intracranial bleed.  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  Patient remained neuro intact throughout the ED stay.    CRITICAL CARE Performed by: Lavonia LOISE Pat   Total critical care time: 45 minutes  Critical care time was exclusive of separately billable procedures and treating other patients.  Critical care was necessary to treat or prevent imminent or life-threatening  deterioration.  Critical care was time spent personally by me on the following activities: development of treatment plan with patient and/or surrogate as well as nursing, discussions with consultants, evaluation of patient's response to treatment, examination of patient, obtaining history from patient or surrogate, ordering and performing treatments and interventions, ordering and review of laboratory studies, ordering and review of radiographic studies, pulse oximetry and re-evaluation of patient's condition.       Final diagnoses:  Hypertensive emergency    ED Discharge Orders     None          Pat Lavonia LOISE, MD 06/10/24 [phone]

## 2024-06-10 NOTE — ED Triage Notes (Signed)
 Pt bib [facility] from [facility]. Pt sent for hypertension w/ systolic in the 200s. Pt states her BP has been this high since last week. Also c/o of a headache.

## 2024-06-10 NOTE — Assessment & Plan Note (Signed)
-   Resume home regimen

## 2024-06-10 NOTE — H&P (Addendum)
 History and Physical    Dorothy [name] FMW:[id number] DOB: 04-30-1956 DOA: 06/10/2024  PCP: Johnson Morna FALCON, NP   Patient coming from: Home  I have personally briefly reviewed patient's old medical records in [facility]  Chief Complaint: Headache, high blood pressure  HPI: Dorothy [name] is a 68 y.o. female with medical history significant for hypertension, hypercholesterolemia, hypothyroidism. Patient presented to ED with complaints of headache, and high blood pressure over the past week.  She reports blood pressure systolic up to 230.  She reports compliance with her antihypertensive-losartan  daily.  She only checked her blood pressure the past week because of the headaches.  Prior to this she never checked it.  Unchanged vision loss from right from prior stroke, no chest pain.  No difficulty breathing.  She reports recent emotional stress - she lost her youngest brother bleed and she was very close to, from an illness -2 weeks ago, now she has to go live with her son.   ED Course: Temperature 97.5.  Heart rate 60s to 75.  Respiratory rate 12-18.  Blood pressure systolic elevated to 252/101. Unremarkable BMP CBC.  Head CT negative for acute abnormality.  Chest x-ray unremarkable. IV Cardene  drip started with goal reduction of 20%, but with systolic dropping to 150s, this time was discontinued.  Hydralazine  10 mg orally was given.  Review of Systems: As per HPI all other systems reviewed and negative.  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   Benign essential HTN 01/17/2012   Cancer (HCC) 1982   ovarian   Cancer of cervix (HCC)    Constipation    Depression    Takes Klonopin    Depression with anxiety 01/17/2012   GERD (gastroesophageal reflux disease)    Hypercholesteremia    Hypertension    takes meds   Hypothyroidism    on Levonthyroxine   Hypothyroidism  (acquired) 01/17/2012   Since age 71   Low sodium levels 09/25/2022   Na level 125 at [facility]   Osteopenia 2018   forearm   Osteoporosis 2025   spine and forearm   Ovarian cyst 01/17/2012   1995  BSO   Personal history of colonic polyps 11/07/2007   Pneumonia    x 2- last time > 5 years ago   Pneumonia 01/17/2012   10 yrs ago and 5 years ago    Rash and other nonspecific skin eruption 01/17/2012   Dorsum hands & extensor surfaces arms; intermittent   VAIN III (vaginal intraepithelial neoplasia grade III) 2015   Tx with Effudex    Past Surgical History:  Procedure Laterality Date   ABDOMINAL HYSTERECTOMY  10/23/1980   ovaries retained--for CIS   CATARACT EXTRACTION Bilateral    CELIAC ARTERY BYPASS  09/25/2012   Procedure: CELIAC ARTERY BYPASS;  Surgeon: Carlin FORBES Haddock, MD;  Location: [facility] OR;  Service: Vascular;  Laterality: N/A;  Bypass using Hemashield Gold Vascular Graft 12mm x 6mm x 40cm   COLONOSCOPY  10/24/2007   [place]-polyps, 5 yr FU   COLONOSCOPY WITH PROPOFOL  N/A 01/07/2024   Procedure: COLONOSCOPY WITH PROPOFOL ;  Surgeon: Cinderella Deatrice FALCON, MD;  Location: [facility];  Service: Endoscopy;  Laterality: N/A;  1:30PM;ASA 1-2   JOINT REPLACEMENT     MESENTERIC ARTERY BYPASS  09/25/2012   Procedure: MESENTERIC ARTERY BYPASS;  Surgeon: Carlin FORBES  Fields, MD;  Location: [facility] OR;  Service: Vascular;  Laterality: N/A;  Bypass using Hemashield Gold Vascular Graft 12mm x 6mm x 40cm   OVARIAN CYST REMOVAL  10/24/1995   -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   POLYPECTOMY  01/07/2024   Procedure: POLYPECTOMY;  Surgeon: Cinderella Deatrice FALCON, MD;  Location: [facility];  Service: Endoscopy;;   TOTAL HIP ARTHROPLASTY  09/06/2011   Procedure: TOTAL HIP ARTHROPLASTY;  Surgeon: Toribio FALCON Chancy, MD;  Location: [facility] OR;  Service: Orthopedics;  Laterality: Left;  TOTAL HIP ARTHROPLASTY LEFT SIDE   TOTAL HIP ARTHROPLASTY  10/11/2011   Procedure: TOTAL HIP  ARTHROPLASTY;  Surgeon: Toribio FALCON Chancy, MD;  Location: [facility] OR;  Service: Orthopedics;  Laterality: Right;  120 MINUTES FOR THIS SURGERY   WOUND EXPLORATION  10/02/2012   Procedure: WOUND EXPLORATION;  Surgeon: Carlin FORBES Haddock, MD;  Location: [facility] OR;  Service: Vascular;  Laterality: N/A;  abdominal wound exploration     reports that she has quit smoking. Her smoking use included cigarettes. She started smoking about 7 months ago. She has never used smokeless tobacco. She reports that she does not drink alcohol and does not use drugs.  Allergies  Allergen Reactions   Sulfa Antibiotics Rash    Family History  Problem Relation Age of Onset   Cancer Mother        lung   Heart failure Father    Breast cancer Sister 32       dec age 40   Cancer Sister    Heart attack Brother 55   Heart attack Brother    Hypertension Son    Ovarian cancer Paternal Grandmother        dec age 56   Anesthesia problems Neg Hx    Hypotension Neg Hx    Malignant hyperthermia Neg Hx    Pseudochol deficiency Neg Hx    Colon cancer Neg Hx    Esophageal cancer Neg Hx    Rectal cancer Neg Hx    Stomach cancer Neg Hx    Prior to Admission medications   Medication Sig Start Date End Date Taking? Authorizing Provider  acetaZOLAMIDE  ER (DIAMOX ) 500 MG capsule Take 1 capsule by mouth 2 (two) times daily. 03/20/23  Yes [provider]  ARIPiprazole  (ABILIFY ) 10 MG tablet Take 5 mg by mouth daily. 02/16/17  Yes [provider]  aspirin  EC 81 MG tablet Take 81 mg by mouth daily. Swallow whole.   Yes [provider]  atorvastatin  (LIPITOR) 40 MG tablet Take 40 mg by mouth daily. 06/19/22  Yes [provider]  brimonidine  (ALPHAGAN ) 0.2 % ophthalmic solution Place 1 drop into the right eye 3 (three) times daily.   Yes [provider]  CALCIUM  PO Take 1 tablet by mouth in the morning and at bedtime.   Yes [provider]  carboxymethylcellulose (REFRESH PLUS) 0.5 % SOLN  Place 1 drop into the left eye in the morning, at noon, and at bedtime.   Yes [provider]  ciprofloxacin  (CILOXAN ) 0.3 % ophthalmic solution Place 1 drop into the right eye in the morning, at noon, in the evening, and at bedtime. Start after surgery. 04/28/24  Yes [provider]  clonazePAM  (KLONOPIN ) 0.5 MG tablet Take 1 mg by mouth 2 (two) times daily.   Yes [provider]  clopidogrel  (PLAVIX ) 75 MG tablet Take 75 mg by mouth daily.   Yes [provider]  dorzolamide -timolol  (COSOPT ) 22.3-6.8 MG/ML  ophthalmic solution Place 1 drop into the right eye 2 (two) times daily.   Yes [provider]  latanoprost  (XALATAN ) 0.005 % ophthalmic solution Place 1 drop into the right eye at bedtime.   Yes [provider]  levocetirizine (XYZAL ) 5 MG tablet Take 5 mg by mouth every evening.   Yes [provider]  levothyroxine  (SYNTHROID ) 112 MCG tablet Take 112 mcg by mouth daily before breakfast.   Yes [provider]  losartan  (COZAAR ) 50 MG tablet Take 50 mg by mouth daily.   Yes [provider]  montelukast  (SINGULAIR ) 10 MG tablet Take 10 mg by mouth daily. 01/03/23  Yes [provider]  Multiple Vitamin (MULTI-VITAMIN) tablet Take 2 tablets by mouth daily.   Yes [provider]  pantoprazole  (PROTONIX ) 40 MG tablet Take 1 tablet (40 mg total) by mouth 2 (two) times daily. Patient taking differently: Take 40 mg by mouth daily. 04/12/23  Yes Ricky Fines, MD  prednisoLONE  acetate (PRED FORTE ) 1 % ophthalmic suspension Place 1 drop into the right eye 4 (four) times daily. Start after surgery. 04/28/24  Yes [provider]  venlafaxine  XR (EFFEXOR -XR) 75 MG 24 hr capsule Take 75 mg by mouth daily. 06/03/24  Yes [provider]  Zoledronic  Acid (RECLAST  IV) Inject 5 mg into the vein as directed. Once yearly for your bones   Yes [provider]  zolpidem  (AMBIEN ) 10 MG tablet Take 10 mg  by mouth at bedtime. 02/04/19  Yes [provider]    Physical Exam: Vitals:   06/10/24 1645 06/10/24 1715 06/10/24 1730 06/10/24 1830  BP: (!) 201/103 (!) 155/97 (!) 160/98 (!) 201/107  Pulse: 73 66 66 65  Resp: 14 14 15 15   Temp:      TempSrc:      SpO2: 98% 97% 97% 98%  Weight:      Height:        Constitutional: Became tearful during exam talking about the loss of her brother Vitals:   06/10/24 1645 06/10/24 1715 06/10/24 1730 06/10/24 1830  BP: (!) 201/103 (!) 155/97 (!) 160/98 (!) 201/107  Pulse: 73 66 66 65  Resp: 14 14 15 15   Temp:      TempSrc:      SpO2: 98% 97% 97% 98%  Weight:      Height:       Eyes: PERRL, lids and conjunctivae normal ENMT: Mucous membranes are moist.  Neck: normal, supple, no masses, no thyromegaly Respiratory: clear to auscultation bilaterally, no wheezing, no crackles. Normal respiratory effort. No accessory muscle use.  Cardiovascular: Regular rate and rhythm, no murmurs / rubs / gallops. No extremity edema.  Extremities warm. Abdomen: no tenderness, no masses palpated. No hepatosplenomegaly. Bowel sounds positive.  Musculoskeletal: no clubbing / cyanosis. No joint deformity upper and lower extremities.  Skin: no rashes, lesions, ulcers. No induration Neurologic: No facial asymmetry, moving extremity spontaneously, speech fluent.   Psychiatric: Normal judgment and insight. Alert and oriented x 3. Normal mood.   Labs on Admission: I have personally reviewed following labs and imaging studies  CBC: Recent Labs  Lab 06/10/24 1514  WBC 7.5  HGB 13.9  HCT 40.5  MCV 84.2  PLT 197   Basic Metabolic Panel: Recent Labs  Lab 06/10/24 1610  NA 143  K 3.7  CL 105  CO2 25  GLUCOSE 131*  BUN 15  CREATININE 1.04*  CALCIUM  8.6*   GFR: Estimated Creatinine Clearance: 49.1 mL/min (A) (by C-G formula  based on SCr of 1.04 mg/dL (H)). Liver Function Tests: Recent Labs  Lab 06/10/24 1610  AST 20  ALT 22  ALKPHOS 57  BILITOT  0.5  PROT 6.4*  ALBUMIN  3.7   Coagulation Profile: Recent Labs  Lab 06/10/24 1610  INR 1.0   Urine analysis:    Component Value Date/Time   COLORURINE YELLOW 04/10/2023 0529   APPEARANCEUR HAZY (A) 04/10/2023 0529   LABSPEC 1.012 04/10/2023 0529   PHURINE 5.0 04/10/2023 0529   GLUCOSEU NEGATIVE 04/10/2023 0529   HGBUR MODERATE (A) 04/10/2023 0529   BILIRUBINUR NEGATIVE 04/10/2023 0529   BILIRUBINUR n 03/20/2018 0855   KETONESUR NEGATIVE 04/10/2023 0529   PROTEINUR 30 (A) 04/10/2023 0529   UROBILINOGEN 0.2 03/20/2018 0855   UROBILINOGEN 0.2 09/13/2012 1028   NITRITE NEGATIVE 04/10/2023 0529   LEUKOCYTESUR NEGATIVE 04/10/2023 0529    Radiological Exams on Admission: CT Head Wo Contrast Result Date: 06/10/2024 CLINICAL DATA:  eval for any evidence of bleed. Hypertensive emergency EXAM: CT HEAD WITHOUT CONTRAST TECHNIQUE: Contiguous axial images were obtained from the base of the skull through the vertex without intravenous contrast. RADIATION DOSE REDUCTION: This exam was performed according to the departmental dose-optimization program which includes automated exposure control, adjustment of the mA and/or kV according to patient size and/or use of iterative reconstruction technique. COMPARISON:  CT head May 29 24. FINDINGS: Brain: Remote right PCA territory infarct with similar calcifications. No evidence of acute large vascular territory infarct, acute hemorrhage, mass lesion, midline shift or hydrocephalus. Vascular: Calcific atherosclerosis.  No hyperdense vessel. Skull: No acute fracture. Sinuses/Orbits: Clear sinuses. No acute orbital findings. Right glaucoma shunt. Other: No mastoid effusions. IMPRESSION: 1. No evidence of acute intracranial abnormality. 2. Remote right PCA territory infarct. Electronically Signed   By: Gilmore GORMAN Molt M.D.   On: 06/10/2024 17:52   DG Chest Port 1 View Result Date: 06/10/2024 CLINICAL DATA:  Hypertension, headache. Evaluate for pulmonary  edema. EXAM: PORTABLE CHEST 1 VIEW COMPARISON:  Prior CT scan of the chest 04/09/2024; prior chest x-ray 04/18/2023 FINDINGS: Cardiac and mediastinal contours are unchanged and within normal limits. No evidence of pulmonary edema, focal airspace infiltrate, pleural effusion or pneumothorax. Chronic bronchitic changes are stable compared to prior imaging. No acute osseous abnormality. IMPRESSION: 1. No acute cardiopulmonary process. Specifically, no evidence of pulmonary edema or heart failure. 2. Stable chronic bronchitic changes. Electronically Signed   By: Wilkie Lent M.D.   On: 06/10/2024 15:37   EKG: Independently reviewed.  Sinus rhythm, rate 67, QTc 427.  No significant change from prior.  Assessment/Plan Principal Problem:   Hypertensive urgency Active Problems:   COPD GOLD 2 /Group E   Stroke (cerebrum) (HCC)  Assessment and Plan: * Hypertensive urgency Blood pressure elevated to 252/101.  Reports compliance with losartan .  Recent emotional stress-loss of close family member.  Per chart review, blood pressure typically better controlled, but has had other episodes of hypertensive urgency.  Head CT negative for acute abnormality. -Cardizem drip started with drop in blood pressure, discontinued, will resume Cardizem drip at lower dose 2.5mg  and gradually titrate to goal blood pressure 180-200 systolic.   - Resume losartan  50 mg daily -Add Norvasc  5 mg daily and uptitrate  - Addendum-patient is quite sensitive to IV antihypertensives, blood pressure dropped again while on 2.5 mg of Cardene , to 140s systolic.  Cardene  drip discontinued.  Will use IV hydralazine  5 mg every 2 hours as needed for systolic > 200  Stroke (cerebrum) (HCC) Received right visual  deficit to right eye. -Resume PTA Aspirin , Plavix , atorvastatin   COPD GOLD 2 /Group E Resume home regimen.  Mood disorder - Resume twice daily 0.5 mg clonazepam , Abilify , nightly Ambien    DVT prophylaxis: Lovenox  Code Status:  FULL Family Communication: None at bedside Disposition Plan: ~ 2 days Consults called: None Admission status: InpT Stepdown I certify that at the point of admission it is my clinical judgment that the patient will require inpatient hospital care spanning beyond 2 midnights from the point of admission due to high intensity of service, high risk for further deterioration and high frequency of surveillance required.  CRITICAL CARE Performed by: Tully FORBES Carwin   Total critical care time: 70 minutes  Critical care time was exclusive of separately billable procedures and treating other patients.  Critical care was necessary to treat or prevent imminent or life-threatening deterioration.  Critical care was time spent personally by me on the following activities: development of treatment plan with patient and/or surrogate as well as nursing, discussions with consultants, evaluation of patient's response to treatment, examination of patient, obtaining history from patient or surrogate, ordering and performing treatments and interventions, ordering and review of laboratory studies, ordering and review of radiographic studies, pulse oximetry and re-evaluation of patient's condition.  Author: Tully FORBES Carwin, MD 06/10/2024 8:36 PM  For on call review [email or web address].

## 2024-06-10 NOTE — Progress Notes (Signed)
 PHARMACIST - PHYSICIAN ORDER COMMUNICATION  CONCERNING: P&T Medication Policy for Ambien  (Zolpidem )  DESCRIPTION:  This patient's order for:  Ambien  (Zolpidem ) 10mg  QHS  has been noted.  For female patients and patients age 67 years or older, dosage of zolpidem  automatically limited to 5 mg.  ACTION TAKEN: The pharmacy department has adjusted the dose of Zolpidem  to 5mg  per policy.   Elsie CHRISTELLA Piety ,PharmD Clinical Pharmacist 06/10/2024 9:47 PM

## 2024-06-10 NOTE — ED Notes (Signed)
 EDP made aware of pts bp being 200 systolic. Will give PO med per [facility].

## 2024-06-10 NOTE — Assessment & Plan Note (Addendum)
 Blood pressure elevated to 252/101.  Reports compliance with losartan .  Recent emotional stress-loss of close family member.  Per chart review, blood pressure typically better controlled, but has had other episodes of hypertensive urgency.  Head CT negative for acute abnormality. -Cardizem drip started with drop in blood pressure, discontinued, will resume Cardizem drip at lower dose 2.5mg  and gradually titrate to goal blood pressure 180-200 systolic.   - Resume losartan  50 mg daily -Add Norvasc  5 mg daily and uptitrate  - Addendum-patient is quite sensitive to IV antihypertensives, blood pressure dropped again while on 2.5 mg of Cardene , to 140s systolic.  Cardene  drip discontinued.  Will use IV hydralazine  5 mg every 2 hours as needed for systolic > 200

## 2024-06-10 NOTE — Assessment & Plan Note (Addendum)
 Received right visual deficit to right eye. -Resume PTA Aspirin , Plavix , atorvastatin 

## 2024-06-10 NOTE — ED Notes (Signed)
PT ambulated to restroom without distress.  

## 2024-06-11 DIAGNOSIS — I160 Hypertensive urgency: Secondary | ICD-10-CM | POA: Diagnosis not present

## 2024-06-11 LAB — BASIC METABOLIC PANEL WITH GFR
Anion gap: 10 (ref 5–15)
BUN: 17 mg/dL (ref 8–23)
CO2: 23 mmol/L (ref 22–32)
Calcium: 8.2 mg/dL — ABNORMAL LOW (ref 8.9–10.3)
Chloride: 106 mmol/L (ref 98–111)
Creatinine, Ser: 1 mg/dL (ref 0.44–1.00)
GFR, Estimated: >60 mL/min (ref >60)
Glucose, Bld: 84 mg/dL (ref 70–99)
Potassium: 3.5 mmol/L (ref 3.5–5.1)
Sodium: 139 mmol/L (ref 135–145)

## 2024-06-11 LAB — HIV ANTIBODY (ROUTINE TESTING W REFLEX): HIV Screen 4th Generation wRfx: NONREACTIVE

## 2024-06-11 LAB — CBC
HCT: 40.2 % (ref 36.0–46.0)
Hemoglobin: 13.4 g/dL (ref 12.0–15.0)
MCH: 27.6 pg (ref 26.0–34.0)
MCHC: 33.3 g/dL (ref 30.0–36.0)
MCV: 82.9 fL (ref 80.0–100.0)
Platelets: 185 K/uL (ref 150–400)
RBC: 4.85 MIL/uL (ref 3.87–5.11)
RDW: 13.4 % (ref 11.5–15.5)
WBC: 6.1 K/uL (ref 4.0–10.5)
nRBC: 0 % (ref 0.0–0.2)

## 2024-06-11 MED ORDER — CLONAZEPAM 0.5 MG PO TABS
1.0000 mg | ORAL_TABLET | Freq: Every day | ORAL | Status: DC
  Administered 2024-06-11: 1 mg via ORAL
  Filled 2024-06-11: qty 2

## 2024-06-11 MED ORDER — AMLODIPINE BESYLATE 5 MG PO TABS
10.0000 mg | ORAL_TABLET | Freq: Every day | ORAL | Status: DC
  Administered 2024-06-12: 10 mg via ORAL
  Filled 2024-06-11: qty 2

## 2024-06-11 MED ORDER — AMLODIPINE BESYLATE 5 MG PO TABS
5.0000 mg | ORAL_TABLET | Freq: Once | ORAL | Status: AC
  Administered 2024-06-11: 5 mg via ORAL
  Filled 2024-06-11: qty 1

## 2024-06-11 NOTE — TOC CM/SW Note (Signed)
 Transition of Care [facility]) - Inpatient Brief Assessment   Patient Details  Name: Dorothy [name] MRN: [id number] Date of Birth: Jan 28, 1956  [facility] of [facility] CM/SW Contact:    Noreen KATHEE Pinal, LCSWA Phone Number: 06/11/2024, 10:20 AM   Clinical Narrative:   Transition of Care Department [facility]) has reviewed patient and no TOC needs have been identified at this time. We will continue to monitor patient advancement through interdisciplinary progression rounds. If new patient transition needs arise, please place a TOC consult.  Transition of Care Asessment: Insurance and Status: Insurance coverage has been reviewed Patient has primary care physician: Yes Home environment has been reviewed: Single Family Home Prior level of function:: Independent Prior/Current Home Services: No current home services Social Drivers of Health Review: SDOH reviewed no interventions necessary Readmission risk has been reviewed: Yes Transition of care needs: no transition of care needs at this time

## 2024-06-11 NOTE — Hospital Course (Signed)
 Dorothy [name] is a 68 y.o. female with medical history significant for hypertension, hypercholesterolemia, hypothyroidism. Patient presented to ED with complaints of headache, and high blood pressure over the past week.  She reports blood pressure systolic up to 230.  She reports compliance with her antihypertensive-losartan  daily.  She only checked her blood pressure the past week because of the headaches.  Prior to this she never checked it.  Unchanged vision loss from right from prior stroke, no chest pain.  No difficulty breathing.  She reports recent emotional stress - she lost her youngest brother bleed and she was very close to, from an illness -2 weeks ago, now she has to go live with her son.    ED Course: Temperature 97.5.  Heart rate 60s to 75.  Respiratory rate 12-18.  Blood pressure systolic elevated to 252/101. Unremarkable BMP CBC.  Head CT negative for acute abnormality.  Chest x-ray unremarkable. IV Cardene  drip started with goal reduction of 20%, but with systolic dropping to 150s, this time was discontinued.  Hydralazine  10 mg orally was given.

## 2024-06-11 NOTE — Progress Notes (Signed)
 PROGRESS NOTE    Patient: Dorothy [name]                            PCP: Johnson Morna FALCON, NP                    DOB: 01-18-1956            DOA: 06/10/2024 FMW:[id number]             DOS: 06/11/2024, 1:36 PM   LOS: 1 day   Date of Service: The patient was seen and examined on 06/11/2024  Subjective:   The patient was seen and examined this morning. Blood pressure labile, hypertensive, Denies any headaches acute visual changes shortness of breath or chest pain No issues overnight .  Brief Narrative:   Dorothy [name] is a 68 y.o. female with medical history significant for hypertension, hypercholesterolemia, hypothyroidism. Patient presented to ED with complaints of headache, and high blood pressure over the past week.  She reports blood pressure systolic up to 230.  She reports compliance with her antihypertensive-losartan  daily.  She only checked her blood pressure the past week because of the headaches.  Prior to this she never checked it.  Unchanged vision loss from right from prior stroke, no chest pain.  No difficulty breathing.  She reports recent emotional stress - she lost her youngest brother bleed and she was very close to, from an illness -2 weeks ago, now she has to go live with her son.    ED Course: Temperature 97.5.  Heart rate 60s to 75.  Respiratory rate 12-18.  Blood pressure systolic elevated to 252/101. Unremarkable BMP CBC.  Head CT negative for acute abnormality.  Chest x-ray unremarkable. IV Cardene  drip started with goal reduction of 20%, but with systolic dropping to 150s, this time was discontinued.  Hydralazine  10 mg orally was given.        Assessment & Plan:   Principal Problem:   Hypertensive urgency Active Problems:   COPD GOLD 2 /Group E   Stroke (cerebrum) (HCC)     Assessment and Plan: * Hypertensive urgency -Patient is very sensitive to antihypertensive medications -Adjusting oral medication, Continue losartan  50 mg p.o. daily -  Increasing Norvasc  to 10 mg p.o. daily   POA: Blood pressure elevated to 252/101.  Reports compliance with losartan .   - episodes of hypertensive urgency.   - Head CT negative for acute abnormality. -Cardizem drip started with drop in blood pressure, discontinued, will resume Cardizem drip a -patient is quite sensitive to IV antihypertensives, blood pressure dropped again while on 2.5 mg of Cardene , to 140s systolic.  Cardene  drip discontinued.   -   - Will continue with  IV hydralazine  5 mg every 2 hours as needed for systolic > 200  Stroke (cerebrum) (HCC) Received right visual deficit to right eye. -Resume PTA Aspirin , Plavix , atorvastatin   COPD GOLD 2 /Group E Resume home regimen.            ----------------------------------------------------------------------------------------------------------------------------------------------- Nutritional status:  The patient's BMI is: Body mass index is 22.48 kg/m. I agree with the assessment and plan as outlined  ------------------------------------------------------------------------------------------------------------------------------------------------  DVT prophylaxis:  enoxaparin  (LOVENOX ) injection 40 mg Start: 06/10/24 2200   Code Status:   Code Status: Full Code  Family Communication: No family member present at bedside-  -Advance care planning has been discussed.   Admission status:   Status is: Inpatient Remains inpatient appropriate  because: Needing IV medication for better BP control   Disposition: From  - home             Planning for discharge in 1-2 days   Procedures:   No admission procedures for hospital encounter.   Antimicrobials:  Anti-infectives (From admission, onward)    None        Medication:   acetaZOLAMIDE  ER  500 mg Oral BID   [START ON 06/12/2024] amLODipine   10 mg Oral Daily   ARIPiprazole   5 mg Oral Daily   aspirin  EC  81 mg Oral Daily   atorvastatin   40 mg Oral Daily    brimonidine   1 drop Right Eye TID   Chlorhexidine  Gluconate Cloth  6 each Topical Q0600   ciprofloxacin   1 drop Right Eye QID   clonazePAM   1 mg Oral QHS   clopidogrel   75 mg Oral Daily   dorzolamide -timolol   1 drop Right Eye BID   enoxaparin  (LOVENOX ) injection  40 mg Subcutaneous Q24H   latanoprost   1 drop Right Eye QHS   levothyroxine   112 mcg Oral Q0600   loratadine   10 mg Oral Daily   losartan   50 mg Oral Daily   montelukast   10 mg Oral Daily   pantoprazole   40 mg Oral Daily   prednisoLONE  acetate  1 drop Right Eye QID   venlafaxine  XR  75 mg Oral Daily   zolpidem   5 mg Oral QHS    acetaminophen  **OR** acetaminophen , hydrALAZINE , ondansetron  **OR** ondansetron  (ZOFRAN ) IV, polyethylene glycol   Objective:   Vitals:   06/11/24 0700 06/11/24 0900 06/11/24 0959 06/11/24 1000  BP: (!) 140/81 (!) 164/89 (!) 164/89 (!) 160/99  Pulse: 65 73 67 65  Resp: 14 12 11 13   Temp:   97.9 F (36.6 C)   TempSrc:   Oral   SpO2: 95% 98% 99% 99%  Weight:      Height:        Intake/Output Summary (Last 24 hours) at 06/11/2024 1336 Last data filed at 06/11/2024 1329 Gross per 24 hour  Intake 25.05 ml  Output 600 ml  Net -574.95 ml   Filed Weights   06/10/24 1520 06/10/24 2130  Weight: 65 kg 65.1 kg     Physical examination:   General:  AAO x 3,  cooperative, no distress;   HEENT:  Normocephalic, PERRL, otherwise with in Normal limits   Neuro:  CNII-XII intact. , normal motor and sensation, reflexes intact   Lungs:   Clear to auscultation BL, Respirations unlabored,  No wheezes / crackles  Cardio:    S1/S2, RRR, No murmure, No Rubs or Gallops   Abdomen:  Soft, non-tender, bowel sounds active all four quadrants, no guarding or peritoneal signs.  Muscular  skeletal:  Limited exam -global generalized weaknesses - in bed, able to move all 4 extremities,   2+ pulses,  symmetric, No pitting edema  Skin:  Dry, warm to touch, negative for any Rashes,  Wounds: Please see nursing  documentation       ------------------------------------------------------------------------------------------------------------------------------------------    LABs:     Latest Ref Rng & Units 06/11/2024    4:37 AM 06/10/2024    3:14 PM 04/18/2023    3:03 PM  CBC  WBC 4.0 - 10.5 K/uL 6.1  7.5  16.2   Hemoglobin 12.0 - 15.0 g/dL 86.5  86.0  86.5   Hematocrit 36.0 - 46.0 % 40.2  40.5  40.0   Platelets 150 - 400 K/uL 185  197  230       Latest Ref Rng & Units 06/11/2024    4:37 AM 06/10/2024    4:10 PM 08/29/2023    2:48 PM  CMP  Glucose 70 - 99 mg/dL 84  868    BUN 8 - 23 mg/dL 17  15    Creatinine 9.55 - 1.00 mg/dL 8.99  8.95  8.84   Sodium 135 - 145 mmol/L 139  143    Potassium 3.5 - 5.1 mmol/L 3.5  3.7    Chloride 98 - 111 mmol/L 106  105    CO2 22 - 32 mmol/L 23  25    Calcium  8.9 - 10.3 mg/dL 8.2  8.6  9.5   Total Protein 6.5 - 8.1 g/dL  6.4    Total Bilirubin 0.0 - 1.2 mg/dL  0.5    Alkaline Phos 38 - 126 U/L  57    AST 15 - 41 U/L  20    ALT 0 - 44 U/L  22         Micro Results Recent Results (from the past 240 hours)  MRSA Next Gen by PCR, Nasal     Status: None   Collection Time: 06/10/24  8:35 PM   Specimen: Nasal Mucosa; Nasal Swab  Result Value Ref Range Status   MRSA by PCR Next Gen NOT DETECTED NOT DETECTED Final    Comment: (NOTE) The GeneXpert MRSA Assay (FDA approved for NASAL specimens only), is one component of a comprehensive MRSA colonization surveillance program. It is not intended to diagnose MRSA infection nor to guide or monitor treatment for MRSA infections. Test performance is not FDA approved in patients less than 65 years old. Performed at [facility], [address] [place], [place] [address]     Radiology Reports CT Head Wo Contrast Result Date: 06/10/2024 CLINICAL DATA:  eval for any evidence of bleed. Hypertensive emergency EXAM: CT HEAD WITHOUT CONTRAST TECHNIQUE: Contiguous axial images were obtained from the base of the  skull through the vertex without intravenous contrast. RADIATION DOSE REDUCTION: This exam was performed according to the departmental dose-optimization program which includes automated exposure control, adjustment of the mA and/or kV according to patient size and/or use of iterative reconstruction technique. COMPARISON:  CT head May 29 24. FINDINGS: Brain: Remote right PCA territory infarct with similar calcifications. No evidence of acute large vascular territory infarct, acute hemorrhage, mass lesion, midline shift or hydrocephalus. Vascular: Calcific atherosclerosis.  No hyperdense vessel. Skull: No acute fracture. Sinuses/Orbits: Clear sinuses. No acute orbital findings. Right glaucoma shunt. Other: No mastoid effusions. IMPRESSION: 1. No evidence of acute intracranial abnormality. 2. Remote right PCA territory infarct. Electronically Signed   By: Gilmore GORMAN Molt M.D.   On: 06/10/2024 17:52   DG Chest Port 1 View Result Date: 06/10/2024 CLINICAL DATA:  Hypertension, headache. Evaluate for pulmonary edema. EXAM: PORTABLE CHEST 1 VIEW COMPARISON:  Prior CT scan of the chest 04/09/2024; prior chest x-ray 04/18/2023 FINDINGS: Cardiac and mediastinal contours are unchanged and within normal limits. No evidence of pulmonary edema, focal airspace infiltrate, pleural effusion or pneumothorax. Chronic bronchitic changes are stable compared to prior imaging. No acute osseous abnormality. IMPRESSION: 1. No acute cardiopulmonary process. Specifically, no evidence of pulmonary edema or heart failure. 2. Stable chronic bronchitic changes. Electronically Signed   By: Wilkie Lent M.D.   On: 06/10/2024 15:37    SIGNED: Adriana DELENA Grams, MD, FHM. FAAFP. [facility] Time spent - 55 min.  In seeing, evaluating and examining the patient. Reviewing medical records, labs, drawn plan of care. [facility],  Pager (please use [email or web address] to page/ text) Please use Epic Secure Chat for non-urgent  communication (7AM-7PM)  If 7PM-7AM, please contact night-coverage [email or web address], 06/11/2024, 1:36 PM

## 2024-06-11 NOTE — Care Management Obs Status (Signed)
 MEDICARE OBSERVATION STATUS NOTIFICATION   Patient Details  Name: Dorothy [name] MRN: [id number] Date of Birth: Jun 17, 1956   Medicare Observation Status Notification Given:  Yes    Sevin Langenbach, RN 06/11/2024, 7:55 PM

## 2024-06-11 NOTE — Care Management CC44 (Signed)
 Condition Code 44 Documentation Completed  Patient Details  Name: MORGIN HALLS MRN: [id number] Date of Birth: September 25, 1956   Condition Code 44 given:  Yes Patient signature on Condition Code 44 notice:  Yes Documentation of 2 MD's agreement:  Yes Code 44 added to claim:  Yes    Adahlia Stembridge, RN 06/11/2024, 7:55 PM

## 2024-06-12 DIAGNOSIS — I160 Hypertensive urgency: Secondary | ICD-10-CM | POA: Diagnosis not present

## 2024-06-12 MED ORDER — LOSARTAN POTASSIUM 50 MG PO TABS: 50.0000 mg | ORAL_TABLET | Freq: Every day | ORAL | 1 refills | Status: AC

## 2024-06-12 MED ORDER — AMLODIPINE BESYLATE 10 MG PO TABS: 10.0000 mg | ORAL_TABLET | Freq: Every day | ORAL | 0 refills | Status: AC

## 2024-06-12 NOTE — Plan of Care (Signed)

## 2024-06-12 NOTE — Discharge Summary (Signed)
 Physician Discharge Summary   Patient: Dorothy [name] MRN: [id number] DOB: 11/09/1955  Admit date:     06/10/2024  Discharge date: 06/12/24  Discharge Physician: Adriana DELENA Grams   PCP: Johnson Morna FALCON, NP   Recommendations at discharge:    Follow-up with PCP in 1 week -Current medication is subject to change and modified by PCP Monitor your blood pressure daily and keep a log  Discharge Diagnoses: Principal Problem:   Hypertensive urgency Active Problems:   COPD GOLD 2 /Group E   Stroke (cerebrum) (HCC)  Resolved Problems:   * No resolved hospital problems. *  Hospital Course: Dorothy [name] is a 68 y.o. female with medical history significant for hypertension, hypercholesterolemia, hypothyroidism. Patient presented to ED with complaints of headache, and high blood pressure over the past week.  She reports blood pressure systolic up to 230.  She reports compliance with her antihypertensive-losartan  daily.  She only checked her blood pressure the past week because of the headaches.  Prior to this she never checked it.  Unchanged vision loss from right from prior stroke, no chest pain.  No difficulty breathing.  She reports recent emotional stress - she lost her youngest brother bleed and she was very close to, from an illness -2 weeks ago, now she has to go live with her son.    ED Course: Temperature 97.5.  Heart rate 60s to 75.  Respiratory rate 12-18.  Blood pressure systolic elevated to 252/101. Unremarkable BMP CBC.  Head CT negative for acute abnormality.  Chest x-ray unremarkable. IV Cardene  drip started with goal reduction of 20%, but with systolic dropping to 150s, this time was discontinued.  Hydralazine  10 mg orally was given.        * Hypertensive urgency -Patient is very sensitive to antihypertensive medications Blood pressure has stabilized  - losartan  50 mg p.o. daily - Norvasc  to 10 mg p.o. daily     POA: Blood pressure elevated to 252/101.   Reports compliance with losartan .   - episodes of hypertensive urgency.   - Head CT negative for acute abnormality. -Cardizem drip started with drop in blood pressure, discontinued, will resume Cardizem drip a -patient is quite sensitive to IV antihypertensives, blood pressure dropped again while on 2.5 mg of Cardene , to 140s systolic.  Cardene  drip discontinued.   -   IV hydralazine  5 mg every 2 hours as needed for systolic > 200   Stroke (cerebrum) (HCC) Received right visual deficit to right eye. -Resume PTA Aspirin , Plavix , atorvastatin    COPD GOLD 2 /Group E Resume home regimen.             ----------------------------------------------------------------------------------------------------------------------------------------------- Nutritional status:  The patient's BMI is: Body mass index is 22.48 kg/m. I agree with the assessment and plan as outlined      Disposition: Home Diet recommendation:  Discharge Diet Orders (From admission, onward)     Start     Ordered   06/12/24 0000  Diet - low sodium heart healthy        06/12/24 1140           Cardiac diet DISCHARGE MEDICATION: Allergies as of 06/12/2024       Reactions   Sulfa Antibiotics Rash        Medication List     TAKE these medications    acetaZOLAMIDE  ER 500 MG capsule Commonly known as: DIAMOX  Take 1 capsule by mouth 2 (two) times daily.   amLODipine  10 MG tablet Commonly known as:  NORVASC  Take 1 tablet (10 mg total) by mouth daily. Start taking on: June 13, 2024   ARIPiprazole  10 MG tablet Commonly known as: ABILIFY  Take 5 mg by mouth daily.   aspirin  EC 81 MG tablet Take 81 mg by mouth daily. Swallow whole.   atorvastatin  40 MG tablet Commonly known as: LIPITOR Take 40 mg by mouth daily.   brimonidine  0.2 % ophthalmic solution Commonly known as: ALPHAGAN  Place 1 drop into the right eye 3 (three) times daily.   CALCIUM  PO Take 1 tablet by mouth in the morning and at  bedtime.   carboxymethylcellulose 0.5 % Soln Commonly known as: REFRESH PLUS Place 1 drop into the left eye in the morning, at noon, and at bedtime.   ciprofloxacin  0.3 % ophthalmic solution Commonly known as: CILOXAN  Place 1 drop into the right eye in the morning, at noon, in the evening, and at bedtime. Start after surgery.   clonazePAM  0.5 MG tablet Commonly known as: KLONOPIN  Take 1 mg by mouth at bedtime.   clopidogrel  75 MG tablet Commonly known as: PLAVIX  Take 75 mg by mouth daily.   dorzolamide -timolol  2-0.5 % ophthalmic solution Commonly known as: COSOPT  Place 1 drop into the right eye 2 (two) times daily.   latanoprost  0.005 % ophthalmic solution Commonly known as: XALATAN  Place 1 drop into the right eye at bedtime.   levocetirizine 5 MG tablet Commonly known as: XYZAL  Take 5 mg by mouth every evening.   levothyroxine  112 MCG tablet Commonly known as: SYNTHROID  Take 112 mcg by mouth daily before breakfast.   losartan  50 MG tablet Commonly known as: COZAAR  Take 1 tablet (50 mg total) by mouth daily.   montelukast  10 MG tablet Commonly known as: SINGULAIR  Take 10 mg by mouth daily.   Multi-Vitamin tablet Take 2 tablets by mouth daily.   pantoprazole  40 MG tablet Commonly known as: PROTONIX  Take 1 tablet (40 mg total) by mouth 2 (two) times daily. What changed: when to take this   prednisoLONE  acetate 1 % ophthalmic suspension Commonly known as: PRED FORTE  Place 1 drop into the right eye 4 (four) times daily. Start after surgery.   RECLAST  IV Inject 5 mg into the vein as directed. Once yearly for your bones   venlafaxine  XR 75 MG 24 hr capsule Commonly known as: EFFEXOR -XR Take 75 mg by mouth daily.   zolpidem  10 MG tablet Commonly known as: AMBIEN  Take 10 mg by mouth at bedtime.        Discharge Exam: Filed Weights   06/10/24 1520 06/10/24 2130  Weight: 65 kg 65.1 kg        General:  AAO x 3,  cooperative, no distress;   HEENT:   Normocephalic, PERRL, otherwise with in Normal limits   Neuro:  CNII-XII intact. , normal motor and sensation, reflexes intact   Lungs:   Clear to auscultation BL, Respirations unlabored,  No wheezes / crackles  Cardio:    S1/S2, RRR, No murmure, No Rubs or Gallops   Abdomen:  Soft, non-tender, bowel sounds active all four quadrants, no guarding or peritoneal signs.  Muscular  skeletal:  Limited exam -global generalized weaknesses - in bed, able to move all 4 extremities,   2+ pulses,  symmetric, No pitting edema  Skin:  Dry, warm to touch, negative for any Rashes,  Wounds: Please see nursing documentation          Condition at discharge: good  The results of significant diagnostics from this hospitalization (including  imaging, microbiology, ancillary and laboratory) are listed below for reference.   Imaging Studies: CT Head Wo Contrast Result Date: 06/10/2024 CLINICAL DATA:  eval for any evidence of bleed. Hypertensive emergency EXAM: CT HEAD WITHOUT CONTRAST TECHNIQUE: Contiguous axial images were obtained from the base of the skull through the vertex without intravenous contrast. RADIATION DOSE REDUCTION: This exam was performed according to the departmental dose-optimization program which includes automated exposure control, adjustment of the mA and/or kV according to patient size and/or use of iterative reconstruction technique. COMPARISON:  CT head May 29 24. FINDINGS: Brain: Remote right PCA territory infarct with similar calcifications. No evidence of acute large vascular territory infarct, acute hemorrhage, mass lesion, midline shift or hydrocephalus. Vascular: Calcific atherosclerosis.  No hyperdense vessel. Skull: No acute fracture. Sinuses/Orbits: Clear sinuses. No acute orbital findings. Right glaucoma shunt. Other: No mastoid effusions. IMPRESSION: 1. No evidence of acute intracranial abnormality. 2. Remote right PCA territory infarct. Electronically Signed   By: Gilmore GORMAN Molt M.D.   On: 06/10/2024 17:52   DG Chest Port 1 View Result Date: 06/10/2024 CLINICAL DATA:  Hypertension, headache. Evaluate for pulmonary edema. EXAM: PORTABLE CHEST 1 VIEW COMPARISON:  Prior CT scan of the chest 04/09/2024; prior chest x-ray 04/18/2023 FINDINGS: Cardiac and mediastinal contours are unchanged and within normal limits. No evidence of pulmonary edema, focal airspace infiltrate, pleural effusion or pneumothorax. Chronic bronchitic changes are stable compared to prior imaging. No acute osseous abnormality. IMPRESSION: 1. No acute cardiopulmonary process. Specifically, no evidence of pulmonary edema or heart failure. 2. Stable chronic bronchitic changes. Electronically Signed   By: Wilkie Lent M.D.   On: 06/10/2024 15:37    Microbiology: Results for orders placed or performed during the hospital encounter of 06/10/24  MRSA Next Gen by PCR, Nasal     Status: None   Collection Time: 06/10/24  8:35 PM   Specimen: Nasal Mucosa; Nasal Swab  Result Value Ref Range Status   MRSA by PCR Next Gen NOT DETECTED NOT DETECTED Final    Comment: (NOTE) The GeneXpert MRSA Assay (FDA approved for NASAL specimens only), is one component of a comprehensive MRSA colonization surveillance program. It is not intended to diagnose MRSA infection nor to guide or monitor treatment for MRSA infections. Test performance is not FDA approved in patients less than 54 years old. Performed at [facility], [address], [place], [place] [address]     Labs: CBC: Recent Labs  Lab 06/10/24 1514 06/11/24 0437  WBC 7.5 6.1  HGB 13.9 13.4  HCT 40.5 40.2  MCV 84.2 82.9  PLT 197 185   Basic Metabolic Panel: Recent Labs  Lab 06/10/24 1610 06/11/24 0437  NA 143 139  K 3.7 3.5  CL 105 106  CO2 25 23  GLUCOSE 131* 84  BUN 15 17  CREATININE 1.04* 1.00  CALCIUM  8.6* 8.2*   Liver Function Tests: Recent Labs  Lab 06/10/24 1610  AST 20  ALT 22  ALKPHOS 57  BILITOT 0.5  PROT 6.4*   ALBUMIN  3.7   CBG: No results for input(s): GLUCAP in the last 168 hours.  Discharge time spent: greater than 30 minutes.  Signed: Adriana DELENA Grams, MD [facility] 06/12/2024

## 2024-06-12 NOTE — Plan of Care (Signed)

## 2024-06-17 ENCOUNTER — Other Ambulatory Visit (HOSPITAL_COMMUNITY): Payer: Self-pay | Admitting: Nurse Practitioner

## 2024-06-17 DIAGNOSIS — Z1231 Encounter for screening mammogram for malignant neoplasm of breast: Secondary | ICD-10-CM

## 2024-09-16 ENCOUNTER — Telehealth (HOSPITAL_COMMUNITY): Payer: Self-pay | Admitting: Pharmacy Technician

## 2024-09-16 ENCOUNTER — Other Ambulatory Visit (HOSPITAL_COMMUNITY): Payer: Self-pay | Admitting: Obstetrics and Gynecology

## 2024-09-16 ENCOUNTER — Telehealth: Payer: Self-pay | Admitting: *Deleted

## 2024-09-16 DIAGNOSIS — M810 Age-related osteoporosis without current pathological fracture: Secondary | ICD-10-CM

## 2024-09-16 NOTE — Telephone Encounter (Signed)
 Call to patient. Advised due for Reclast  infusion. Plan created under infusion navigator. Lab appointment scheduled for 09/29/24 at 1100 per patient request. Future order for bmp placed. Patient advised [facility] would reach out to review benefits and schedule Reclast  infusion. Patient agreeable.   Routing to provider for review and to sign plan for Reclast .

## 2024-09-16 NOTE — Telephone Encounter (Signed)
 Auth Submission: NO AUTH NEEDED Site of care: [facility] Payer: [employer] MEDICARE Medication & CPT/J Code(s) submitted: Reclast  (Zolendronic acid) J3489 Diagnosis Code: M81.0 Route of submission (phone, fax, portal):  Phone # Fax # Auth type: Buy/Bill HB Units/visits requested: 5MG  X 1 DOSE, EVERY 12 MONTHS Reference number:  Approval from: 09/16/2024 to 10/22/24     Dagoberto Armour, CPhT [facility] Phone: [phone] 09/16/2024

## 2024-09-25 NOTE — Telephone Encounter (Signed)
 Reclast  scheduled 10/06/24.

## 2024-09-25 NOTE — Telephone Encounter (Signed)
 Plan and encounter reviewed and closed.

## 2024-09-29 ENCOUNTER — Other Ambulatory Visit

## 2024-10-02 ENCOUNTER — Other Ambulatory Visit

## 2024-10-02 DIAGNOSIS — M810 Age-related osteoporosis without current pathological fracture: Secondary | ICD-10-CM

## 2024-10-03 LAB — BASIC METABOLIC PANEL WITH GFR
BUN: 14 mg/dL (ref 7–25)
CO2: 24 mmol/L (ref 20–32)
Calcium: 9.3 mg/dL (ref 8.6–10.4)
Chloride: 105 mmol/L (ref 98–110)
Creat: 1.02 mg/dL (ref 0.50–1.05)
Glucose, Bld: 77 mg/dL (ref 65–99)
Potassium: 4.3 mmol/L (ref 3.5–5.3)
Sodium: 138 mmol/L (ref 135–146)
eGFR: 60 mL/min/1.73m2 (ref >=60)

## 2024-10-05 ENCOUNTER — Ambulatory Visit: Payer: Self-pay | Admitting: Obstetrics and Gynecology

## 2024-10-06 ENCOUNTER — Ambulatory Visit (HOSPITAL_COMMUNITY)
Admission: RE | Admit: 2024-10-06 | Discharge: 2024-10-06 | Disposition: A | Source: Ambulatory Visit | Attending: Obstetrics and Gynecology

## 2024-10-06 VITALS — BP 127/71 | HR 57 | Temp 97.9°F | Resp 16

## 2024-10-06 DIAGNOSIS — M810 Age-related osteoporosis without current pathological fracture: Secondary | ICD-10-CM

## 2024-10-06 MED ORDER — ACETAMINOPHEN 325 MG PO TABS: 650.0000 mg | ORAL_TABLET | Freq: Once | ORAL | Status: DC

## 2024-10-06 MED ORDER — ZOLEDRONIC ACID 5 MG/100ML IV SOLN
INTRAVENOUS | Status: AC
  Filled 2024-10-06: qty 100

## 2024-10-06 MED ORDER — SODIUM CHLORIDE 0.9 % IV SOLN: INTRAVENOUS | Status: DC

## 2024-10-06 MED ORDER — DIPHENHYDRAMINE HCL 25 MG PO CAPS: 25.0000 mg | ORAL_CAPSULE | Freq: Once | ORAL | Status: DC

## 2024-10-06 MED ORDER — ZOLEDRONIC ACID 5 MG/100ML IV SOLN
5.0000 mg | Freq: Once | INTRAVENOUS | Status: AC
  Administered 2024-10-06: 11:00:00 5 mg via INTRAVENOUS

## 2024-11-20 ENCOUNTER — Other Ambulatory Visit: Payer: Self-pay | Admitting: Nurse Practitioner

## 2024-11-20 DIAGNOSIS — Z1231 Encounter for screening mammogram for malignant neoplasm of breast: Secondary | ICD-10-CM

## 2024-12-11 ENCOUNTER — Ambulatory Visit

## 2025-01-21 ENCOUNTER — Encounter: Admitting: Obstetrics and Gynecology
